# Patient Record
Sex: Female | Born: 1935 | Race: White | Hispanic: No | Smoking: Former smoker
Health system: Southern US, Community
[De-identification: ages and names within clinical notes are randomized; demographics above are authoritative.]

## PROBLEM LIST (undated history)

## (undated) DIAGNOSIS — I10 Essential (primary) hypertension: Secondary | ICD-10-CM

## (undated) DIAGNOSIS — B370 Candidal stomatitis: Secondary | ICD-10-CM

## (undated) DIAGNOSIS — G629 Polyneuropathy, unspecified: Secondary | ICD-10-CM

## (undated) DIAGNOSIS — R0602 Shortness of breath: Secondary | ICD-10-CM

## (undated) DIAGNOSIS — M419 Scoliosis, unspecified: Secondary | ICD-10-CM

## (undated) DIAGNOSIS — G459 Transient cerebral ischemic attack, unspecified: Secondary | ICD-10-CM

## (undated) DIAGNOSIS — C8590 Non-Hodgkin lymphoma, unspecified, unspecified site: Secondary | ICD-10-CM

## (undated) DIAGNOSIS — R51 Headache: Secondary | ICD-10-CM

## (undated) DIAGNOSIS — H269 Unspecified cataract: Secondary | ICD-10-CM

## (undated) DIAGNOSIS — Z8619 Personal history of other infectious and parasitic diseases: Secondary | ICD-10-CM

## (undated) DIAGNOSIS — IMO0001 Reserved for inherently not codable concepts without codable children: Secondary | ICD-10-CM

## (undated) DIAGNOSIS — D649 Anemia, unspecified: Secondary | ICD-10-CM

## (undated) DIAGNOSIS — Z9889 Other specified postprocedural states: Secondary | ICD-10-CM

## (undated) DIAGNOSIS — R112 Nausea with vomiting, unspecified: Secondary | ICD-10-CM

## (undated) DIAGNOSIS — E039 Hypothyroidism, unspecified: Secondary | ICD-10-CM

## (undated) DIAGNOSIS — I639 Cerebral infarction, unspecified: Secondary | ICD-10-CM

## (undated) DIAGNOSIS — C801 Malignant (primary) neoplasm, unspecified: Secondary | ICD-10-CM

## (undated) DIAGNOSIS — Z87440 Personal history of urinary (tract) infections: Secondary | ICD-10-CM

## (undated) DIAGNOSIS — E7800 Pure hypercholesterolemia, unspecified: Secondary | ICD-10-CM

## (undated) DIAGNOSIS — M1990 Unspecified osteoarthritis, unspecified site: Secondary | ICD-10-CM

## (undated) DIAGNOSIS — Z5189 Encounter for other specified aftercare: Secondary | ICD-10-CM

## (undated) DIAGNOSIS — Z9289 Personal history of other medical treatment: Secondary | ICD-10-CM

## (undated) HISTORY — PX: OTHER SURGICAL HISTORY: SHX169

## (undated) HISTORY — PX: MOUTH SURGERY: SHX715

## (undated) HISTORY — PX: PORTACATH PLACEMENT: SHX2246

## (undated) HISTORY — PX: NEPHRECTOMY: SHX65

## (undated) HISTORY — DX: Candidal stomatitis: B37.0

## (undated) HISTORY — PX: TONSILLECTOMY: SUR1361

---

## 1998-02-19 ENCOUNTER — Other Ambulatory Visit: Admission: RE | Admit: 1998-02-19 | Discharge: 1998-02-19 | Payer: Self-pay | Admitting: Family Medicine

## 2010-03-29 ENCOUNTER — Encounter: Payer: Self-pay | Admitting: Internal Medicine

## 2010-04-01 ENCOUNTER — Other Ambulatory Visit: Payer: Self-pay | Admitting: Family Medicine

## 2010-04-01 DIAGNOSIS — R7989 Other specified abnormal findings of blood chemistry: Secondary | ICD-10-CM

## 2010-04-05 ENCOUNTER — Ambulatory Visit
Admission: RE | Admit: 2010-04-05 | Discharge: 2010-04-05 | Disposition: A | Discharge status: Home / Self Care | Payer: Medicare Other | Source: Ambulatory Visit | Attending: Family Medicine | Admitting: Family Medicine

## 2010-04-05 DIAGNOSIS — R7989 Other specified abnormal findings of blood chemistry: Secondary | ICD-10-CM

## 2010-05-06 ENCOUNTER — Other Ambulatory Visit: Payer: Self-pay | Admitting: Internal Medicine

## 2010-05-06 ENCOUNTER — Encounter (HOSPITAL_BASED_OUTPATIENT_CLINIC_OR_DEPARTMENT_OTHER): Payer: Medicare Other | Admitting: Internal Medicine

## 2010-05-06 DIAGNOSIS — D649 Anemia, unspecified: Secondary | ICD-10-CM

## 2010-05-06 LAB — CBC & DIFF AND RETIC
BASO%: 0.4 % (ref 0.0–2.0)
Eosinophils Absolute: 0.1 10*3/uL (ref 0.0–0.5)
Immature Retic Fract: 13.7 % — ABNORMAL HIGH (ref 0.00–10.70)
LYMPH%: 28.5 % (ref 14.0–49.7)
MCHC: 32.3 g/dL (ref 31.5–36.0)
MONO#: 0.1 10*3/uL (ref 0.1–0.9)
NEUT#: 1.9 10*3/uL (ref 1.5–6.5)
Platelets: 257 10*3/uL (ref 145–400)
RBC: 2.2 10*6/uL — ABNORMAL LOW (ref 3.70–5.45)
Retic %: 2.9 % — ABNORMAL HIGH (ref 0.50–1.50)
Retic Ct Abs: 63.8 10*3/uL (ref 18.30–72.70)
WBC: 2.8 10*3/uL — ABNORMAL LOW (ref 3.9–10.3)
lymph#: 0.8 10*3/uL — ABNORMAL LOW (ref 0.9–3.3)

## 2010-05-08 LAB — SPEP & IFE WITH QIG
Albumin ELP: 56.6 % (ref 55.8–66.1)
Alpha-1-Globulin: 6.8 % — ABNORMAL HIGH (ref 2.9–4.9)
Alpha-2-Globulin: 7.8 % (ref 7.1–11.8)
Beta 2: 12.6 % — ABNORMAL HIGH (ref 3.2–6.5)
Beta Globulin: 7.2 % (ref 4.7–7.2)
IgA: 31 mg/dL — ABNORMAL LOW (ref 68–378)

## 2010-05-08 LAB — IRON AND TIBC: TIBC: 276 ug/dL (ref 250–470)

## 2010-05-08 LAB — COMPREHENSIVE METABOLIC PANEL
ALT: 8 U/L (ref 0–35)
BUN: 17 mg/dL (ref 6–23)
CO2: 24 mEq/L (ref 19–32)
Calcium: 9.7 mg/dL (ref 8.4–10.5)
Chloride: 105 mEq/L (ref 96–112)
Creatinine, Ser: 0.99 mg/dL (ref 0.40–1.20)

## 2010-05-08 LAB — KAPPA/LAMBDA LIGHT CHAINS
Kappa free light chain: 2.41 mg/dL — ABNORMAL HIGH (ref 0.33–1.94)
Lambda Free Lght Chn: 1.71 mg/dL (ref 0.57–2.63)

## 2010-05-08 LAB — LACTATE DEHYDROGENASE: LDH: 209 U/L (ref 94–250)

## 2010-05-08 LAB — VITAMIN B12: Vitamin B-12: 441 pg/mL (ref 211–911)

## 2010-05-20 ENCOUNTER — Encounter (HOSPITAL_BASED_OUTPATIENT_CLINIC_OR_DEPARTMENT_OTHER): Payer: Medicare Other | Admitting: Internal Medicine

## 2010-05-20 ENCOUNTER — Other Ambulatory Visit: Payer: Self-pay | Admitting: Internal Medicine

## 2010-05-20 DIAGNOSIS — D649 Anemia, unspecified: Secondary | ICD-10-CM

## 2010-05-20 DIAGNOSIS — D539 Nutritional anemia, unspecified: Secondary | ICD-10-CM

## 2010-05-20 DIAGNOSIS — D472 Monoclonal gammopathy: Secondary | ICD-10-CM

## 2010-05-20 LAB — CBC WITH DIFFERENTIAL/PLATELET
Basophils Absolute: 0 10*3/uL (ref 0.0–0.1)
EOS%: 2.3 % (ref 0.0–7.0)
Eosinophils Absolute: 0.1 10*3/uL (ref 0.0–0.5)
HCT: 21.2 % — ABNORMAL LOW (ref 34.8–46.6)
HGB: 7.1 g/dL — ABNORMAL LOW (ref 11.6–15.9)
MONO#: 0.1 10*3/uL (ref 0.1–0.9)
NEUT#: 1.6 10*3/uL (ref 1.5–6.5)
RDW: 17.8 % — ABNORMAL HIGH (ref 11.2–14.5)
WBC: 2.7 10*3/uL — ABNORMAL LOW (ref 3.9–10.3)
lymph#: 1 10*3/uL (ref 0.9–3.3)

## 2010-05-20 LAB — FECAL OCCULT BLOOD, GUAIAC: Occult Blood: NEGATIVE

## 2010-05-21 ENCOUNTER — Encounter (HOSPITAL_COMMUNITY)
Admission: RE | Admit: 2010-05-21 | Discharge: 2010-05-21 | Disposition: A | Discharge status: Home / Self Care | Payer: Medicare Other | Source: Ambulatory Visit | Attending: Internal Medicine | Admitting: Internal Medicine

## 2010-05-21 DIAGNOSIS — D649 Anemia, unspecified: Secondary | ICD-10-CM | POA: Insufficient documentation

## 2010-05-22 ENCOUNTER — Encounter (HOSPITAL_COMMUNITY): Payer: Medicare Other

## 2010-05-23 LAB — CROSSMATCH
Antibody Screen: NEGATIVE
Unit division: 0

## 2010-05-27 ENCOUNTER — Encounter (HOSPITAL_COMMUNITY): Payer: Medicare Other

## 2010-05-31 ENCOUNTER — Encounter (HOSPITAL_COMMUNITY): Payer: Medicare Other

## 2010-06-04 ENCOUNTER — Encounter (HOSPITAL_COMMUNITY): Payer: Medicare Other

## 2010-06-10 ENCOUNTER — Encounter (HOSPITAL_COMMUNITY): Payer: Medicare Other | Attending: Internal Medicine

## 2010-06-10 DIAGNOSIS — D649 Anemia, unspecified: Secondary | ICD-10-CM | POA: Insufficient documentation

## 2010-06-14 ENCOUNTER — Other Ambulatory Visit: Payer: Self-pay | Admitting: Internal Medicine

## 2010-06-14 ENCOUNTER — Encounter (HOSPITAL_COMMUNITY): Payer: Medicare Other

## 2010-06-14 DIAGNOSIS — D649 Anemia, unspecified: Secondary | ICD-10-CM

## 2010-06-14 LAB — DIFFERENTIAL
Basophils Absolute: 0 10*3/uL (ref 0.0–0.1)
Basophils Relative: 0 % (ref 0–1)
Lymphocytes Relative: 30 % (ref 12–46)
Monocytes Relative: 4 % (ref 3–12)
Neutro Abs: 1.9 10*3/uL (ref 1.7–7.7)
Neutrophils Relative %: 63 % (ref 43–77)

## 2010-06-14 LAB — CBC
HCT: 26.2 % — ABNORMAL LOW (ref 36.0–46.0)
Hemoglobin: 8.3 g/dL — ABNORMAL LOW (ref 12.0–15.0)
RBC: 2.53 MIL/uL — ABNORMAL LOW (ref 3.87–5.11)
WBC: 2.9 10*3/uL — ABNORMAL LOW (ref 4.0–10.5)

## 2010-06-19 ENCOUNTER — Other Ambulatory Visit: Payer: Self-pay | Admitting: Internal Medicine

## 2010-06-19 ENCOUNTER — Encounter (HOSPITAL_BASED_OUTPATIENT_CLINIC_OR_DEPARTMENT_OTHER): Payer: Medicare Other | Admitting: Internal Medicine

## 2010-06-19 DIAGNOSIS — D539 Nutritional anemia, unspecified: Secondary | ICD-10-CM

## 2010-06-19 DIAGNOSIS — C8589 Other specified types of non-Hodgkin lymphoma, extranodal and solid organ sites: Secondary | ICD-10-CM

## 2010-06-19 DIAGNOSIS — C8590 Non-Hodgkin lymphoma, unspecified, unspecified site: Secondary | ICD-10-CM

## 2010-06-19 DIAGNOSIS — D649 Anemia, unspecified: Secondary | ICD-10-CM

## 2010-06-19 DIAGNOSIS — D472 Monoclonal gammopathy: Secondary | ICD-10-CM

## 2010-06-19 LAB — CBC WITH DIFFERENTIAL/PLATELET
Basophils Absolute: 0 10*3/uL (ref 0.0–0.1)
Eosinophils Absolute: 0.1 10*3/uL (ref 0.0–0.5)
HCT: 23.7 % — ABNORMAL LOW (ref 34.8–46.6)
HGB: 7.9 g/dL — ABNORMAL LOW (ref 11.6–15.9)
LYMPH%: 26.2 % (ref 14.0–49.7)
MCV: 102.8 fL — ABNORMAL HIGH (ref 79.5–101.0)
MONO#: 0.1 10*3/uL (ref 0.1–0.9)
MONO%: 3.6 % (ref 0.0–14.0)
NEUT#: 1.9 10*3/uL (ref 1.5–6.5)
Platelets: 227 10*3/uL (ref 145–400)
RBC: 2.3 10*6/uL — ABNORMAL LOW (ref 3.70–5.45)
WBC: 2.8 10*3/uL — ABNORMAL LOW (ref 3.9–10.3)

## 2010-06-21 ENCOUNTER — Encounter (HOSPITAL_BASED_OUTPATIENT_CLINIC_OR_DEPARTMENT_OTHER): Payer: Medicare Other | Admitting: Internal Medicine

## 2010-06-21 DIAGNOSIS — D472 Monoclonal gammopathy: Secondary | ICD-10-CM

## 2010-06-21 DIAGNOSIS — D539 Nutritional anemia, unspecified: Secondary | ICD-10-CM

## 2010-06-21 DIAGNOSIS — D649 Anemia, unspecified: Secondary | ICD-10-CM

## 2010-06-22 LAB — CROSSMATCH
ABO/RH(D): O POS
Antibody Screen: NEGATIVE
Unit division: 0

## 2010-06-24 ENCOUNTER — Encounter (HOSPITAL_COMMUNITY)
Admission: RE | Admit: 2010-06-24 | Discharge: 2010-06-24 | Disposition: A | Discharge status: Home / Self Care | Payer: Medicare Other | Source: Ambulatory Visit | Attending: Internal Medicine | Admitting: Internal Medicine

## 2010-06-24 DIAGNOSIS — C8589 Other specified types of non-Hodgkin lymphoma, extranodal and solid organ sites: Secondary | ICD-10-CM | POA: Insufficient documentation

## 2010-06-24 DIAGNOSIS — C8590 Non-Hodgkin lymphoma, unspecified, unspecified site: Secondary | ICD-10-CM

## 2010-06-24 DIAGNOSIS — N289 Disorder of kidney and ureter, unspecified: Secondary | ICD-10-CM | POA: Insufficient documentation

## 2010-06-24 MED ORDER — FLUDEOXYGLUCOSE F - 18 (FDG) INJECTION
16.8000 | Freq: Once | INTRAVENOUS | Status: AC | PRN
  Administered 2010-06-24: 16.8 via INTRAVENOUS

## 2010-06-25 ENCOUNTER — Encounter (HOSPITAL_BASED_OUTPATIENT_CLINIC_OR_DEPARTMENT_OTHER): Payer: Medicare Other | Admitting: Internal Medicine

## 2010-06-25 ENCOUNTER — Other Ambulatory Visit: Payer: Self-pay | Admitting: Internal Medicine

## 2010-06-25 DIAGNOSIS — D539 Nutritional anemia, unspecified: Secondary | ICD-10-CM

## 2010-06-25 DIAGNOSIS — D649 Anemia, unspecified: Secondary | ICD-10-CM

## 2010-06-25 DIAGNOSIS — D472 Monoclonal gammopathy: Secondary | ICD-10-CM

## 2010-06-25 LAB — COMPREHENSIVE METABOLIC PANEL
Albumin: 4.1 g/dL (ref 3.5–5.2)
Alkaline Phosphatase: 118 U/L — ABNORMAL HIGH (ref 39–117)
BUN: 15 mg/dL (ref 6–23)
CO2: 25 mEq/L (ref 19–32)
Glucose, Bld: 119 mg/dL — ABNORMAL HIGH (ref 70–99)
Potassium: 4 mEq/L (ref 3.5–5.3)
Total Protein: 6.8 g/dL (ref 6.0–8.3)

## 2010-06-25 LAB — CBC WITH DIFFERENTIAL/PLATELET
Basophils Absolute: 0 10*3/uL (ref 0.0–0.1)
Eosinophils Absolute: 0 10*3/uL (ref 0.0–0.5)
HGB: 10.6 g/dL — ABNORMAL LOW (ref 11.6–15.9)
LYMPH%: 28.5 % (ref 14.0–49.7)
MCV: 100.3 fL (ref 79.5–101.0)
MONO#: 0.1 10*3/uL (ref 0.1–0.9)
MONO%: 4.7 % (ref 0.0–14.0)
NEUT#: 1.6 10*3/uL (ref 1.5–6.5)
Platelets: 221 10*3/uL (ref 145–400)
RDW: 18.3 % — ABNORMAL HIGH (ref 11.2–14.5)
WBC: 2.5 10*3/uL — ABNORMAL LOW (ref 3.9–10.3)

## 2010-06-25 LAB — LACTATE DEHYDROGENASE: LDH: 290 U/L — ABNORMAL HIGH (ref 94–250)

## 2010-06-25 LAB — URIC ACID: Uric Acid, Serum: 4.7 mg/dL (ref 2.4–7.0)

## 2010-06-26 ENCOUNTER — Other Ambulatory Visit: Payer: Self-pay | Admitting: Internal Medicine

## 2010-06-26 DIAGNOSIS — C8590 Non-Hodgkin lymphoma, unspecified, unspecified site: Secondary | ICD-10-CM

## 2010-07-05 ENCOUNTER — Other Ambulatory Visit: Payer: Self-pay | Admitting: Internal Medicine

## 2010-07-05 ENCOUNTER — Ambulatory Visit (HOSPITAL_COMMUNITY)
Admission: RE | Admit: 2010-07-05 | Discharge: 2010-07-05 | Disposition: A | Discharge status: Home / Self Care | Payer: Medicare Other | Source: Ambulatory Visit | Attending: Internal Medicine | Admitting: Internal Medicine

## 2010-07-05 ENCOUNTER — Ambulatory Visit (HOSPITAL_COMMUNITY): Payer: Medicare Other

## 2010-07-05 DIAGNOSIS — N289 Disorder of kidney and ureter, unspecified: Secondary | ICD-10-CM | POA: Insufficient documentation

## 2010-07-05 DIAGNOSIS — N2889 Other specified disorders of kidney and ureter: Secondary | ICD-10-CM

## 2010-07-05 DIAGNOSIS — Z8673 Personal history of transient ischemic attack (TIA), and cerebral infarction without residual deficits: Secondary | ICD-10-CM | POA: Insufficient documentation

## 2010-07-05 DIAGNOSIS — E039 Hypothyroidism, unspecified: Secondary | ICD-10-CM | POA: Insufficient documentation

## 2010-07-05 DIAGNOSIS — Z87898 Personal history of other specified conditions: Secondary | ICD-10-CM | POA: Insufficient documentation

## 2010-07-05 DIAGNOSIS — I10 Essential (primary) hypertension: Secondary | ICD-10-CM | POA: Insufficient documentation

## 2010-07-05 DIAGNOSIS — E7800 Pure hypercholesterolemia, unspecified: Secondary | ICD-10-CM | POA: Insufficient documentation

## 2010-07-05 DIAGNOSIS — C8590 Non-Hodgkin lymphoma, unspecified, unspecified site: Secondary | ICD-10-CM

## 2010-07-05 DIAGNOSIS — D649 Anemia, unspecified: Secondary | ICD-10-CM | POA: Insufficient documentation

## 2010-07-05 LAB — PROTIME-INR
INR: 1.15 (ref 0.00–1.49)
Prothrombin Time: 14.9 seconds (ref 11.6–15.2)

## 2010-07-05 LAB — CBC
HCT: 27.1 % — ABNORMAL LOW (ref 36.0–46.0)
MCHC: 32.8 g/dL (ref 30.0–36.0)
Platelets: 227 10*3/uL (ref 150–400)
RDW: 16.3 % — ABNORMAL HIGH (ref 11.5–15.5)
WBC: 3.3 10*3/uL — ABNORMAL LOW (ref 4.0–10.5)

## 2010-07-05 LAB — APTT: aPTT: 32 seconds (ref 24–37)

## 2010-07-09 ENCOUNTER — Other Ambulatory Visit: Payer: Self-pay | Admitting: Internal Medicine

## 2010-07-09 ENCOUNTER — Encounter (HOSPITAL_BASED_OUTPATIENT_CLINIC_OR_DEPARTMENT_OTHER): Payer: Medicare Other | Admitting: Internal Medicine

## 2010-07-09 ENCOUNTER — Encounter (HOSPITAL_COMMUNITY)
Admission: RE | Admit: 2010-07-09 | Discharge: 2010-07-09 | Disposition: A | Discharge status: Home / Self Care | Payer: Medicare Other | Source: Ambulatory Visit | Attending: Internal Medicine | Admitting: Internal Medicine

## 2010-07-09 DIAGNOSIS — C649 Malignant neoplasm of unspecified kidney, except renal pelvis: Secondary | ICD-10-CM

## 2010-07-09 DIAGNOSIS — D649 Anemia, unspecified: Secondary | ICD-10-CM | POA: Insufficient documentation

## 2010-07-09 DIAGNOSIS — D472 Monoclonal gammopathy: Secondary | ICD-10-CM

## 2010-07-09 DIAGNOSIS — D539 Nutritional anemia, unspecified: Secondary | ICD-10-CM

## 2010-07-09 LAB — COMPREHENSIVE METABOLIC PANEL
ALT: 13 U/L (ref 0–35)
AST: 20 U/L (ref 0–37)
CO2: 27 mEq/L (ref 19–32)
Calcium: 9.2 mg/dL (ref 8.4–10.5)
Creatinine, Ser: 0.85 mg/dL (ref 0.50–1.10)
Total Bilirubin: 0.8 mg/dL (ref 0.3–1.2)

## 2010-07-09 LAB — CBC WITH DIFFERENTIAL/PLATELET
BASO%: 0.3 % (ref 0.0–2.0)
Basophils Absolute: 0 10*3/uL (ref 0.0–0.1)
EOS%: 2.3 % (ref 0.0–7.0)
HGB: 8 g/dL — ABNORMAL LOW (ref 11.6–15.9)
MCH: 35 pg — ABNORMAL HIGH (ref 25.1–34.0)
MCHC: 34.3 g/dL (ref 31.5–36.0)
MCV: 102.1 fL — ABNORMAL HIGH (ref 79.5–101.0)
MONO%: 3.9 % (ref 0.0–14.0)
RBC: 2.29 10*6/uL — ABNORMAL LOW (ref 3.70–5.45)
RDW: 17.9 % — ABNORMAL HIGH (ref 11.2–14.5)
lymph#: 0.7 10*3/uL — ABNORMAL LOW (ref 0.9–3.3)

## 2010-07-11 ENCOUNTER — Encounter (HOSPITAL_COMMUNITY): Payer: Medicare Other

## 2010-07-11 LAB — TYPE & CROSSMATCH - CHCC

## 2010-07-12 LAB — CROSSMATCH
Antibody Screen: POSITIVE
Unit division: 0
Unit division: 0

## 2010-07-15 ENCOUNTER — Encounter (HOSPITAL_COMMUNITY): Payer: Medicare Other

## 2010-07-19 ENCOUNTER — Encounter (HOSPITAL_COMMUNITY): Payer: Medicare Other

## 2010-07-23 ENCOUNTER — Encounter (HOSPITAL_COMMUNITY): Payer: Medicare Other

## 2010-07-26 ENCOUNTER — Other Ambulatory Visit: Payer: Self-pay | Admitting: Urology

## 2010-07-29 ENCOUNTER — Encounter (HOSPITAL_COMMUNITY): Payer: Medicare Other

## 2010-08-02 ENCOUNTER — Ambulatory Visit (HOSPITAL_COMMUNITY)
Admission: RE | Admit: 2010-08-02 | Discharge: 2010-08-02 | Disposition: A | Discharge status: Home / Self Care | Payer: Medicare Other | Source: Ambulatory Visit | Attending: Urology | Admitting: Urology

## 2010-08-02 DIAGNOSIS — M47817 Spondylosis without myelopathy or radiculopathy, lumbosacral region: Secondary | ICD-10-CM | POA: Insufficient documentation

## 2010-08-02 DIAGNOSIS — K7689 Other specified diseases of liver: Secondary | ICD-10-CM | POA: Insufficient documentation

## 2010-08-02 DIAGNOSIS — M4120 Other idiopathic scoliosis, site unspecified: Secondary | ICD-10-CM | POA: Insufficient documentation

## 2010-08-02 DIAGNOSIS — C8589 Other specified types of non-Hodgkin lymphoma, extranodal and solid organ sites: Secondary | ICD-10-CM | POA: Insufficient documentation

## 2010-08-02 DIAGNOSIS — N83209 Unspecified ovarian cyst, unspecified side: Secondary | ICD-10-CM | POA: Insufficient documentation

## 2010-08-02 DIAGNOSIS — R109 Unspecified abdominal pain: Secondary | ICD-10-CM | POA: Insufficient documentation

## 2010-08-02 DIAGNOSIS — N281 Cyst of kidney, acquired: Secondary | ICD-10-CM | POA: Insufficient documentation

## 2010-08-02 DIAGNOSIS — C649 Malignant neoplasm of unspecified kidney, except renal pelvis: Secondary | ICD-10-CM | POA: Insufficient documentation

## 2010-08-02 MED ORDER — GADOBENATE DIMEGLUMINE 529 MG/ML IV SOLN
11.0000 mL | Freq: Once | INTRAVENOUS | Status: AC | PRN
  Administered 2010-08-02: 11 mL via INTRAVENOUS

## 2010-08-27 ENCOUNTER — Other Ambulatory Visit (HOSPITAL_COMMUNITY): Payer: Medicare Other

## 2010-08-27 ENCOUNTER — Encounter (HOSPITAL_COMMUNITY)
Admission: RE | Admit: 2010-08-27 | Discharge: 2010-08-27 | Disposition: A | Discharge status: Home / Self Care | Payer: Medicare Other | Source: Ambulatory Visit | Attending: Urology | Admitting: Urology

## 2010-08-27 ENCOUNTER — Other Ambulatory Visit: Payer: Self-pay | Admitting: Urology

## 2010-08-27 LAB — COMPREHENSIVE METABOLIC PANEL
ALT: 10 U/L (ref 0–35)
Albumin: 3.6 g/dL (ref 3.5–5.2)
Alkaline Phosphatase: 125 U/L — ABNORMAL HIGH (ref 39–117)
Chloride: 105 mEq/L (ref 96–112)
Glucose, Bld: 94 mg/dL (ref 70–99)
Potassium: 4.6 mEq/L (ref 3.5–5.1)
Sodium: 141 mEq/L (ref 135–145)
Total Bilirubin: 0.7 mg/dL (ref 0.3–1.2)
Total Protein: 6.5 g/dL (ref 6.0–8.3)

## 2010-08-27 LAB — CBC
HCT: 29.2 % — ABNORMAL LOW (ref 36.0–46.0)
Hemoglobin: 9.6 g/dL — ABNORMAL LOW (ref 12.0–15.0)
MCH: 34 pg (ref 26.0–34.0)
MCV: 103.5 fL — ABNORMAL HIGH (ref 78.0–100.0)
RBC: 2.82 MIL/uL — ABNORMAL LOW (ref 3.87–5.11)

## 2010-08-30 ENCOUNTER — Encounter (HOSPITAL_COMMUNITY)
Admission: RE | Admit: 2010-08-30 | Discharge: 2010-08-30 | Disposition: A | Discharge status: Home / Self Care | Payer: Medicare Other | Source: Ambulatory Visit | Attending: Urology | Admitting: Urology

## 2010-08-30 ENCOUNTER — Other Ambulatory Visit: Payer: Self-pay | Admitting: Urology

## 2010-09-02 ENCOUNTER — Other Ambulatory Visit: Payer: Self-pay | Admitting: Urology

## 2010-09-02 ENCOUNTER — Inpatient Hospital Stay (HOSPITAL_COMMUNITY): Payer: Medicare Other

## 2010-09-02 ENCOUNTER — Inpatient Hospital Stay (HOSPITAL_COMMUNITY)
Admission: RE | Admit: 2010-09-02 | Discharge: 2010-09-10 | DRG: 657 | Disposition: A | Discharge status: Skilled Nursing Facility | Payer: Medicare Other | Source: Ambulatory Visit | Attending: Urology | Admitting: Urology

## 2010-09-02 DIAGNOSIS — D649 Anemia, unspecified: Secondary | ICD-10-CM | POA: Diagnosis present

## 2010-09-02 DIAGNOSIS — Z87891 Personal history of nicotine dependence: Secondary | ICD-10-CM

## 2010-09-02 DIAGNOSIS — Z8673 Personal history of transient ischemic attack (TIA), and cerebral infarction without residual deficits: Secondary | ICD-10-CM

## 2010-09-02 DIAGNOSIS — I10 Essential (primary) hypertension: Secondary | ICD-10-CM | POA: Diagnosis present

## 2010-09-02 DIAGNOSIS — E785 Hyperlipidemia, unspecified: Secondary | ICD-10-CM | POA: Diagnosis present

## 2010-09-02 DIAGNOSIS — Z79899 Other long term (current) drug therapy: Secondary | ICD-10-CM

## 2010-09-02 DIAGNOSIS — Z85828 Personal history of other malignant neoplasm of skin: Secondary | ICD-10-CM

## 2010-09-02 DIAGNOSIS — T448X5A Adverse effect of centrally-acting and adrenergic-neuron-blocking agents, initial encounter: Secondary | ICD-10-CM | POA: Diagnosis not present

## 2010-09-02 DIAGNOSIS — I823 Embolism and thrombosis of renal vein: Secondary | ICD-10-CM | POA: Diagnosis present

## 2010-09-02 DIAGNOSIS — E039 Hypothyroidism, unspecified: Secondary | ICD-10-CM | POA: Diagnosis present

## 2010-09-02 DIAGNOSIS — I498 Other specified cardiac arrhythmias: Secondary | ICD-10-CM | POA: Diagnosis not present

## 2010-09-02 DIAGNOSIS — C8589 Other specified types of non-Hodgkin lymphoma, extranodal and solid organ sites: Secondary | ICD-10-CM | POA: Diagnosis present

## 2010-09-02 DIAGNOSIS — Z7982 Long term (current) use of aspirin: Secondary | ICD-10-CM

## 2010-09-02 DIAGNOSIS — C649 Malignant neoplasm of unspecified kidney, except renal pelvis: Secondary | ICD-10-CM

## 2010-09-02 LAB — CBC
Hemoglobin: 10.7 g/dL — ABNORMAL LOW (ref 12.0–15.0)
MCH: 33.5 pg (ref 26.0–34.0)
MCHC: 34 g/dL (ref 30.0–36.0)
MCV: 98.7 fL (ref 78.0–100.0)
RBC: 3.19 MIL/uL — ABNORMAL LOW (ref 3.87–5.11)

## 2010-09-02 LAB — BASIC METABOLIC PANEL
BUN: 10 mg/dL (ref 6–23)
CO2: 29 mEq/L (ref 19–32)
Calcium: 9.2 mg/dL (ref 8.4–10.5)
Creatinine, Ser: 0.63 mg/dL (ref 0.50–1.10)
Glucose, Bld: 193 mg/dL — ABNORMAL HIGH (ref 70–99)

## 2010-09-02 NOTE — Op Note (Signed)
NAMEKESS, MCILWAIN NO.:  [id number]  MEDICAL RECORD NO.:  [id number]  LOCATION:  2550                         FACILITY:  [facility]  PHYSICIAN:  Bertram Millard. Barrie Wale, M.D.DATE OF BIRTH:  1925/05/24  DATE OF PROCEDURE:  09/02/2010 DATE OF DISCHARGE:                              OPERATIVE REPORT   PREOPERATIVE DIAGNOSIS:  An 8-cm lower pole right renal mass with tumor thrombus to inferior vena cava.  POSTOPERATIVE DIAGNOSIS:  An 8-cm lower pole right renal mass with tumor thrombus to inferior vena cava.  PRINCIPAL PROCEDURE:  Radical nephrectomy, tumor thrombectomy.  SURGEON:  Bertram Millard. Retta Diones, MD and Larina Earthly, MD.  ANESTHESIA:  General endotracheal.  COMPLICATIONS:  None.  ESTIMATED BLOOD LOSS:  300 mL.  FLUID REPLACEMENT:  1 liter crystalloid, 1 unit packed red blood cells.  SPECIMEN:  Right kidney to pathology.  COMPLICATIONS:  None.  BRIEF HISTORY:  This is an 77 year old female, who presents at this time for radical nephrectomy.  She has a history of lymphoma.  In her follow- up, she recently underwent a CT scan that revealed an 8-cm lower pole renal mass.  There was some question of thrombus in the renal vein, this was verified on MRI scan.  The thrombus went up to but not necessarily into the vena cava.  Metastatic evaluation was negative.  I have counseled the patient on two occasions in the office about surgery, risks and complications.  She understands these, and desires to proceed.  DESCRIPTION OF PROCEDURE:  The patient was identified in the holding area.  She received preoperative pulmonary artery catheter placement and arterial line placement by anesthesia.  She received preoperative IV antibiotics.  After the surgical side was marked, she was taken to the operating room where general anesthetic was administered.  She was placed in the recumbent position with a bump in the right flank area. Her abdomen was prepped and draped.   Time-out was then performed.  The procedure was then commenced.  A generous right subcostal incision was made anteriorly and carried down through the muscular and fascial layers with electrocautery.  Her fascia was somewhat thinned due to the patient's age.  The abdomen was entered.  The retractor was placed. Adhesions between the colon and the gallbladder were taken down with electrocautery.  The tumor was quite mobile and it was in the lower pole of the kidney.  After the retractor was placed, the anterior surface of Gerota's fascia was carefully cleaned with blunt and electrocautery dissection.  The duodenum was mobilized medially with dissection.  The inferior vena cava was then identified.  The ureter was quite anterior and was identified, clipped, and divided.  The gonadal vein was also clipped and divided.  This dissection was then carried superiorly and medially along the cava.  The posterior renal artery was seen.  This was doubly clipped medially with Hemlocks, and singly clipped laterally.  It was then divided.  Dissection was carried out circumferentially around the renal vein.  The tumor thrombus was seen through the wall of the renal vein, protruding up into the vena cava with respirations.  The tumor thrombus was then milked back into the  renal vein, and once it was generously inside the renal vein, Dr. Arbie Cookey clipped or clamped the renal vein just lateral to the vena cava.  Another Debakey clamp was placed about 2 cm more lateral to this, with the tumor thrombus well lateral to that second clamp.  The renal vein was then divided.  Dr. Arbie Cookey will dictate this part of the case separately, as well as closure of the renal vein.  Following division and ligation of the renal vein, the remaining attachments of the kidney were then taken care of with electrocautery.  The kidney was then passed from the patient.  Within the area of the adrenal bed, (the adrenal could not be seen at  this point), small blood vessels were electrocoagulated.  Surgicel was placed in this area.  At this point, the surgical site/renal bed was hemostatic.  We then mixed 20 mL of Exparel and 100 mL of saline and injected this in the patient's subcutaneous and muscular layers. Following this, two-layer closure was then performed in an anatomic fashion using #1 PDS placed in simple running fashion.  Staples were then placed to reapproximate the skin edges.  Dry sterile dressing was placed.  Estimated blood loss was approximately 300 mL.  The patient received a liter of crystalloid and was started on 1 unit of packed red cells per anesthesia.  She was awakened and taken to PACU in stable condition.     Bertram Millard. Laetitia Schnepf, M.D.     SMD/MEDQ  D:  09/02/2010  T:  09/02/2010  Job:  [id number]  cc:   Larina Earthly, M.D. Lajuana Matte, M.D. [name], MD  Electronically Signed by Marcine Matar M.D. on 09/02/2010 12:16:50 PM

## 2010-09-03 LAB — CBC
MCH: 33 pg (ref 26.0–34.0)
MCHC: 34 g/dL (ref 30.0–36.0)
MCV: 97 fL (ref 78.0–100.0)
Platelets: 165 10*3/uL (ref 150–400)
RBC: 3 MIL/uL — ABNORMAL LOW (ref 3.87–5.11)
RDW: 18.6 % — ABNORMAL HIGH (ref 11.5–15.5)

## 2010-09-03 LAB — TYPE AND SCREEN
ABO/RH(D): O POS
Donor AG Type: NEGATIVE
Donor AG Type: NEGATIVE
Donor AG Type: NEGATIVE
Donor AG Type: NEGATIVE
Unit division: 0
Unit division: 0
Unit division: 0
Unit division: 0

## 2010-09-03 LAB — BASIC METABOLIC PANEL
CO2: 27 mEq/L (ref 19–32)
Calcium: 8.2 mg/dL — ABNORMAL LOW (ref 8.4–10.5)
Creatinine, Ser: 0.88 mg/dL (ref 0.50–1.10)
GFR calc non Af Amer: >60 mL/min (ref >60)

## 2010-09-03 LAB — POCT I-STAT 4, (NA,K, GLUC, HGB,HCT)
Glucose, Bld: 135 mg/dL — ABNORMAL HIGH (ref 70–99)
HCT: 21 % — ABNORMAL LOW (ref 36.0–46.0)
Hemoglobin: 7.1 g/dL — ABNORMAL LOW (ref 12.0–15.0)
Potassium: 3.3 mEq/L — ABNORMAL LOW (ref 3.5–5.1)
Potassium: 3.4 mEq/L — ABNORMAL LOW (ref 3.5–5.1)

## 2010-09-04 LAB — CBC
HCT: 31 % — ABNORMAL LOW (ref 36.0–46.0)
RDW: 18 % — ABNORMAL HIGH (ref 11.5–15.5)
WBC: 4.5 10*3/uL (ref 4.0–10.5)

## 2010-09-05 LAB — CARDIAC PANEL(CRET KIN+CKTOT+MB+TROPI)
CK, MB: 2 ng/mL (ref 0.3–4.0)
Total CK: 43 U/L (ref 7–177)
Total CK: 72 U/L (ref 7–177)
Troponin I: <0.3 ng/mL (ref <0.30)

## 2010-09-06 LAB — BASIC METABOLIC PANEL
CO2: 28 mEq/L (ref 19–32)
Calcium: 8.4 mg/dL (ref 8.4–10.5)
Chloride: 108 mEq/L (ref 96–112)
Glucose, Bld: 88 mg/dL (ref 70–99)
Potassium: 3.1 mEq/L — ABNORMAL LOW (ref 3.5–5.1)
Sodium: 141 mEq/L (ref 135–145)

## 2010-09-06 LAB — T3, FREE: T3, Free: 2.6 pg/mL (ref 2.3–4.2)

## 2010-09-06 LAB — T4, FREE: Free T4: 1.72 ng/dL (ref 0.80–1.80)

## 2010-09-09 NOTE — Discharge Summary (Addendum)
Melissa Cross, Melissa Cross NO.:  [id number]  MEDICAL RECORD NO.:  [id number]  LOCATION:  SDS                          FACILITY:  [facility]  PHYSICIAN:  Bertram Millard. Loudon Krakow, M.D.DATE OF BIRTH:  [birth year]  DATE OF ADMISSION:  09/02/2010 DATE OF DISCHARGE:                              DISCHARGE SUMMARY   PRIMARY DIAGNOSIS:  Renal cell carcinoma, pathologic stage T3a Nx, nuclear grade 2/4, clear cell type.  PRINCIPAL PROCEDURE:  Right radical nephrectomy, tumor thrombectomy from cava on September 02, 2010.  BRIEF HISTORY:  This 77 year old female was admitted on September 02, 2010, for definitive surgical management of an 8-cm right lower-pole renal mass.  This is consistent with the renal cell carcinoma, with the tumor thrombus in her right renal vein and possibly into the vena cava.  The patient has a negative metastatic evaluation.  She does have a history of non-Hodgkin's lymphoma.  She is cared for byDr. Si Gaul.  The patient's primary care provider is Dr. Mila Palmer.  ADMISSION LABORATORIES:  Hemoglobin was 29.2, white count of 43, 000, platelet count 217,000.  Comprehensive metabolic profile was normal except for an alkaline phosphatase, which was slightly elevated at 125. Chest x-ray revealed no active disease.  EKG revealed sinus rhythm with first-degree AV block.  HOSPITAL COURSE:  The patient was admitted to my service, and on the day of admission, underwent right radical nephrectomy, this was an open approach, due to the large size of the patient's renal mass.  She also underwent tumor thrombectomy from her vena cava.  She tolerated the procedure well.  She received 2 units of packed red blood cells for her borderline low hematocrit to start with, plus blood loss. Postoperatively, she did well.  A couple days of postop, she had an episode of bradycardia, down to 148.  Her resting heart rate previously was 60.  She was on beta-blockers.  MI was  ruled out by the hospital service.  The patient's bowel function was somewhat slow to recover, but by the third postoperative day, she was eating a regular diet.  She ambulated.  Her hemoglobin and serum creatinine were both stable.  Due to the patient's age and due to remove postoperative weakness, it was recommended that she wait for nursing home placement.  That is pending at this time.  DISCHARGE MEDICATIONS:  Her medications on discharge will include Percocet 5/325, one p.o. daily or q.4 h. p.r.n., Norvasc 5 mg daily. Her nadolol was stopped.  She will receive multivitamin once a day, simvastatin 20 mg daily, vitamin B12 of 1000 mcg daily, levothyroxine 50 mcg daily, aspirin enteric-coated 81 mg daily, and Citracal Plus D 1 daily.  She can take her supplements of ginger root, garlic extract, and wheat grass.  She can also take Advil 200 mg 2 p.o. q.4 h. p.r.n. pain.  DISCHARGE INSTRUCTIONS:  The patient will follow up in my office for staple removal.  It will be recommended that she increase her activities, get physical therapy if needed, and take a regular diet.     Bertram Millard. Verity Gilcrest, M.D.     SMD/MEDQ  D:  09/08/2010  T:  09/08/2010  Job:  [id number]  cc:   [name], MD Lajuana Matte, M.D.  Electronically Signed by Marcine Matar M.D. on 10/10/2010 05:25:06 AM

## 2010-09-24 NOTE — Op Note (Signed)
  Melissa Cross, Melissa Cross NO.:  [id number]  MEDICAL RECORD NO.:  [id number]  LOCATION:  3302                         FACILITY:  [facility]  PHYSICIAN:  Larina Earthly, M.D.    DATE OF BIRTH:  [birth year]  DATE OF PROCEDURE:  09/02/2010 DATE OF DISCHARGE:                              OPERATIVE REPORT   PREOPERATIVE DIAGNOSIS:  Right renal cell carcinoma.  POSTOPERATIVE DIAGNOSIS:  Right renal cell carcinoma.  PROCEDURE:  Control of right renal vein and assistance with Dr. Marcine Matar, primary surgeon for right renal nephrectomy.  ANESTHESIA:  General endotracheal.  COMPLICATIONS:  None.  DISPOSITION:  To recovery room stable.  PROCEDURE IN DETAIL:  The patient was taken to the operating room and prepped and draped in usual fashion, which will be dictated as a separate note by Dr. Retta Diones, a right subcostal incision was made. The kidney was mobilized fully and was rotated anteriorly.  On mobilization of the vena cava and right renal vein, the tumor was easily seen within the right renal vein and was actually ball valving back-and- forth into the vena cava with respirations on the ventilator.  The tumor was pushed back into the renal vein manually was quite mobile but this was quite easy, there was no trapping of the tumor in the renal vein. The tumor was pushed back into the renal vein and then DeBakey aortic clamps were placed proximal and distal.  A cuff of renal vein was left for closure of the renal vein at the vena cava.  The renal vein was divided and the cuff of renal vein was oversewn with a running 5-0 Prolene suture in two layers.  The aortic DeBakey clamp was removed and one additional suture was required for hemostasis.  The renal artery was divided between Ligaclips.  The remainder of the dictation including closure will be dictated as a separate note by Dr. Marcine Matar.     Larina Earthly, M.D.     TFE/MEDQ  D:  09/03/2010  T:   09/03/2010  Job:  [id number]  Electronically Signed by Benjamim Harnish M.D. on 09/24/2010 01:11:36 PM

## 2010-09-30 ENCOUNTER — Other Ambulatory Visit: Payer: Self-pay | Admitting: Internal Medicine

## 2010-09-30 ENCOUNTER — Encounter (HOSPITAL_BASED_OUTPATIENT_CLINIC_OR_DEPARTMENT_OTHER): Payer: Medicare Other | Admitting: Internal Medicine

## 2010-09-30 DIAGNOSIS — D649 Anemia, unspecified: Secondary | ICD-10-CM

## 2010-09-30 DIAGNOSIS — D472 Monoclonal gammopathy: Secondary | ICD-10-CM

## 2010-09-30 DIAGNOSIS — Z905 Acquired absence of kidney: Secondary | ICD-10-CM

## 2010-09-30 DIAGNOSIS — D539 Nutritional anemia, unspecified: Secondary | ICD-10-CM

## 2010-09-30 DIAGNOSIS — C8589 Other specified types of non-Hodgkin lymphoma, extranodal and solid organ sites: Secondary | ICD-10-CM

## 2010-09-30 DIAGNOSIS — C649 Malignant neoplasm of unspecified kidney, except renal pelvis: Secondary | ICD-10-CM

## 2010-09-30 LAB — CBC WITH DIFFERENTIAL/PLATELET
BASO%: 0.3 % (ref 0.0–2.0)
EOS%: 1.7 % (ref 0.0–7.0)
MCH: 34.4 pg — ABNORMAL HIGH (ref 25.1–34.0)
MCV: 100.2 fL (ref 79.5–101.0)
MONO%: 3.1 % (ref 0.0–14.0)
RBC: 3.23 10*6/uL — ABNORMAL LOW (ref 3.70–5.45)
RDW: 16.5 % — ABNORMAL HIGH (ref 11.2–14.5)
lymph#: 1.2 10*3/uL (ref 0.9–3.3)

## 2010-09-30 LAB — COMPREHENSIVE METABOLIC PANEL
ALT: 12 U/L (ref 0–35)
AST: 22 U/L (ref 0–37)
Albumin: 4 g/dL (ref 3.5–5.2)
Alkaline Phosphatase: 109 U/L (ref 39–117)
Potassium: 4 mEq/L (ref 3.5–5.3)
Sodium: 140 mEq/L (ref 135–145)
Total Protein: 7.1 g/dL (ref 6.0–8.3)

## 2010-10-08 ENCOUNTER — Other Ambulatory Visit: Payer: Self-pay | Admitting: Internal Medicine

## 2010-10-08 DIAGNOSIS — C8190 Hodgkin lymphoma, unspecified, unspecified site: Secondary | ICD-10-CM

## 2010-10-10 ENCOUNTER — Ambulatory Visit (HOSPITAL_COMMUNITY)
Admission: RE | Admit: 2010-10-10 | Discharge: 2010-10-10 | Disposition: A | Discharge status: Home / Self Care | Payer: Medicare Other | Source: Ambulatory Visit | Attending: Internal Medicine | Admitting: Internal Medicine

## 2010-10-10 ENCOUNTER — Other Ambulatory Visit: Payer: Self-pay | Admitting: Internal Medicine

## 2010-10-10 ENCOUNTER — Other Ambulatory Visit (HOSPITAL_COMMUNITY): Payer: Self-pay | Admitting: Internal Medicine

## 2010-10-10 DIAGNOSIS — Z8673 Personal history of transient ischemic attack (TIA), and cerebral infarction without residual deficits: Secondary | ICD-10-CM | POA: Insufficient documentation

## 2010-10-10 DIAGNOSIS — C8190 Hodgkin lymphoma, unspecified, unspecified site: Secondary | ICD-10-CM

## 2010-10-10 DIAGNOSIS — Z87891 Personal history of nicotine dependence: Secondary | ICD-10-CM | POA: Insufficient documentation

## 2010-10-10 DIAGNOSIS — I10 Essential (primary) hypertension: Secondary | ICD-10-CM | POA: Insufficient documentation

## 2010-10-10 DIAGNOSIS — C50919 Malignant neoplasm of unspecified site of unspecified female breast: Secondary | ICD-10-CM

## 2010-10-10 DIAGNOSIS — E039 Hypothyroidism, unspecified: Secondary | ICD-10-CM | POA: Insufficient documentation

## 2010-10-10 DIAGNOSIS — C8589 Other specified types of non-Hodgkin lymphoma, extranodal and solid organ sites: Secondary | ICD-10-CM | POA: Insufficient documentation

## 2010-10-10 NOTE — Consult Note (Signed)
Melissa Cross, KEN NO.:  [id number]  MEDICAL RECORD NO.:  [id number]  LOCATION:  6708                         FACILITY:  [facility]  PHYSICIAN:  Richarda Overlie, MD       DATE OF BIRTH:  [birth year]  DATE OF CONSULTATION: DATE OF DISCHARGE:                                CONSULTATION   REQUESTING PHYSICIAN:  Bertram Millard. Dahlstedt, MD  CHIEF COMPLAINT:  Bradycardia.  SUBJECTIVE:  This is an 77 year old female with a history of 8-cm lower right renal mass status post radical nephrectomy who is being evaluated for bradycardia.  The patient was recently evaluated for anemia.  Bone marrow biopsy revealed lymphoma.  She had a PET CT scan performed that showed an 8-cm right renal mass consistent with renal cell carcinoma, clear cell type.  The patient has had 20-25 pound weight loss within the last 4 months.  She does not have any gross hematuria or flank pain. She has had decreased oral intake since the surgery but denies any nausea, vomiting, abdominal pain at this time.  She has been afebrile.  PAST MEDICAL HISTORY: 1. Dyslipidemia. 2. Hypertension. 3. Hypothyroidism. 4. Malignant lymphoma. 5. Non-Hodgkin lymphoma. 6. Previous heart murmur. 7. History of skin cancer. 8. Transient ischemic attack.  SURGICAL HISTORY:  Tonsillectomy, right nephrectomy.  HOME MEDICATIONS:  Aspirin, Centrum Silver, garlic, ginger, levothyroxine, atoll, simvastatin, vitamin B12, vitamin B3.  ALLERGIES:  No known drug allergies.  FAMILY HISTORY:  Paternal history of colon cancer.  Family history of death in the family.  Father passed away at the age of 73 from colon cancer, liver metastasis and mother passed away from stroke at the age of 40.  She has 2 sons and 2 daughters.  SOCIAL HISTORY:  She drinks 2 glasses of coffee per day.  She smoked however, a pack a day for 30 years, quit in 1995.  She is divorced.  She is retired and drinks alcohol occasionally.  REVIEW OF SYSTEMS:   Complete review of systems was done is documented in the HPI.  PHYSICAL EXAMINATION:  VITAL SIGNS:  Blood pressure 128/66, respirations 18, pulse 49, temperature 98.4, 96% on room air. GENERAL:  Comfortable currently in no acute cardiopulmonary distress. HEENT:  Pupils equal and reactive.  Extraocular movements intact. NECK:  Supple.  No JVD. LUNGS:  Clear to auscultation. CARDIOVASCULAR:  Regular rate and rhythm.  No murmurs, rubs or gallops. ABDOMEN:  The patient refused examination. NEUROLOGIC:  The patient refused examination.  IMAGING:  EKG pending at this time.  LABORATORY DATA:  CBC, WBC 4.5, hemoglobin 10.0, hematocrit 31.0, platelet count of 167.  ASSESSMENT AND PLAN:  Bradycardia, likely secondary to nadolol could be secondary to narcotics since the patient has received earlier during the course of her treatment here.  However, she is currently on Toradol for pain..  The patient denies any cardiopulmonary symptoms.  She does have a history of hypothyroidism, therefore, we will check a TSH and a free T4.  We will check an EKG on the telemetry.  She has first degree AV block.  We will rule out an acute coronary syndrome.  Postop we will cycle cardiac enzymes q. 8  x3, obtain a 2-D echo to rule out wall motion abnormalities.  Kindly note that the patient deferred a full examination during this interview and was extremely unhappy that she was disturbed at around 11:30 at night without prior notification.  We thank Dr. Patsi Sears for this consult.  She is a full code.     Richarda Overlie, MD     NA/MEDQ  D:  09/04/2010  T:  09/05/2010  Job:  [id number]  Electronically Signed by Richarda Overlie MD on 10/10/2010 01:29:41 PM

## 2010-10-11 ENCOUNTER — Ambulatory Visit (HOSPITAL_COMMUNITY): Payer: Medicare Other | Attending: Internal Medicine | Admitting: Radiology

## 2010-10-11 DIAGNOSIS — C50919 Malignant neoplasm of unspecified site of unspecified female breast: Secondary | ICD-10-CM

## 2010-10-11 DIAGNOSIS — I428 Other cardiomyopathies: Secondary | ICD-10-CM | POA: Insufficient documentation

## 2010-10-14 ENCOUNTER — Encounter (HOSPITAL_COMMUNITY): Payer: Self-pay | Admitting: Internal Medicine

## 2010-10-14 ENCOUNTER — Other Ambulatory Visit: Payer: Self-pay | Admitting: Internal Medicine

## 2010-10-14 ENCOUNTER — Encounter (HOSPITAL_BASED_OUTPATIENT_CLINIC_OR_DEPARTMENT_OTHER): Payer: Medicare Other | Admitting: Internal Medicine

## 2010-10-14 DIAGNOSIS — C649 Malignant neoplasm of unspecified kidney, except renal pelvis: Secondary | ICD-10-CM

## 2010-10-14 DIAGNOSIS — D539 Nutritional anemia, unspecified: Secondary | ICD-10-CM

## 2010-10-14 DIAGNOSIS — D649 Anemia, unspecified: Secondary | ICD-10-CM

## 2010-10-14 DIAGNOSIS — D472 Monoclonal gammopathy: Secondary | ICD-10-CM

## 2010-10-14 DIAGNOSIS — Z5111 Encounter for antineoplastic chemotherapy: Secondary | ICD-10-CM

## 2010-10-14 DIAGNOSIS — C8589 Other specified types of non-Hodgkin lymphoma, extranodal and solid organ sites: Secondary | ICD-10-CM

## 2010-10-14 LAB — COMPREHENSIVE METABOLIC PANEL
ALT: 9 U/L (ref 0–35)
AST: 16 U/L (ref 0–37)
Albumin: 3.7 g/dL (ref 3.5–5.2)
Alkaline Phosphatase: 96 U/L (ref 39–117)
BUN: 19 mg/dL (ref 6–23)
CO2: 24 mEq/L (ref 19–32)
Calcium: 8.9 mg/dL (ref 8.4–10.5)
Chloride: 104 mEq/L (ref 96–112)
Creatinine, Ser: 1.01 mg/dL (ref 0.50–1.10)
Glucose, Bld: 108 mg/dL — ABNORMAL HIGH (ref 70–99)
Potassium: 4.2 mEq/L (ref 3.5–5.3)
Sodium: 140 mEq/L (ref 135–145)
Total Bilirubin: 0.4 mg/dL (ref 0.3–1.2)
Total Protein: 6.3 g/dL (ref 6.0–8.3)

## 2010-10-14 LAB — CBC WITH DIFFERENTIAL/PLATELET
Basophils Absolute: 0 10*3/uL (ref 0.0–0.1)
EOS%: 2.6 % (ref 0.0–7.0)
HGB: 11.4 g/dL — ABNORMAL LOW (ref 11.6–15.9)
MCH: 33.3 pg (ref 25.1–34.0)
MCHC: 32.9 g/dL (ref 31.5–36.0)
MCV: 101.2 fL — ABNORMAL HIGH (ref 79.5–101.0)
MONO%: 4.2 % (ref 0.0–14.0)
RDW: 15.3 % — ABNORMAL HIGH (ref 11.2–14.5)

## 2010-10-14 LAB — URIC ACID: Uric Acid, Serum: 5.7 mg/dL (ref 2.4–7.0)

## 2010-10-14 LAB — LACTATE DEHYDROGENASE: LDH: 217 U/L (ref 94–250)

## 2010-10-15 ENCOUNTER — Encounter (HOSPITAL_BASED_OUTPATIENT_CLINIC_OR_DEPARTMENT_OTHER): Payer: Medicare Other | Admitting: Internal Medicine

## 2010-10-15 DIAGNOSIS — C649 Malignant neoplasm of unspecified kidney, except renal pelvis: Secondary | ICD-10-CM

## 2010-10-15 DIAGNOSIS — C8589 Other specified types of non-Hodgkin lymphoma, extranodal and solid organ sites: Secondary | ICD-10-CM

## 2010-10-15 DIAGNOSIS — Z5189 Encounter for other specified aftercare: Secondary | ICD-10-CM

## 2010-10-21 ENCOUNTER — Encounter (HOSPITAL_BASED_OUTPATIENT_CLINIC_OR_DEPARTMENT_OTHER): Payer: Medicare Other | Admitting: Internal Medicine

## 2010-10-21 ENCOUNTER — Other Ambulatory Visit: Payer: Self-pay | Admitting: Internal Medicine

## 2010-10-21 DIAGNOSIS — D649 Anemia, unspecified: Secondary | ICD-10-CM

## 2010-10-21 DIAGNOSIS — D539 Nutritional anemia, unspecified: Secondary | ICD-10-CM

## 2010-10-21 DIAGNOSIS — C8589 Other specified types of non-Hodgkin lymphoma, extranodal and solid organ sites: Secondary | ICD-10-CM

## 2010-10-21 DIAGNOSIS — D472 Monoclonal gammopathy: Secondary | ICD-10-CM

## 2010-10-21 DIAGNOSIS — C649 Malignant neoplasm of unspecified kidney, except renal pelvis: Secondary | ICD-10-CM

## 2010-10-21 DIAGNOSIS — Z79899 Other long term (current) drug therapy: Secondary | ICD-10-CM

## 2010-10-21 DIAGNOSIS — D709 Neutropenia, unspecified: Secondary | ICD-10-CM

## 2010-10-21 LAB — CBC WITH DIFFERENTIAL/PLATELET
Basophils Absolute: 0 10*3/uL (ref 0.0–0.1)
Eosinophils Absolute: 0.1 10*3/uL (ref 0.0–0.5)
HCT: 29.2 % — ABNORMAL LOW (ref 34.8–46.6)
HGB: 9.9 g/dL — ABNORMAL LOW (ref 11.6–15.9)
MONO#: 0 10*3/uL — ABNORMAL LOW (ref 0.1–0.9)
NEUT%: 0 % — ABNORMAL LOW (ref 38.4–76.8)
WBC: 0.7 10*3/uL — CL (ref 3.9–10.3)
lymph#: 0.6 10*3/uL — ABNORMAL LOW (ref 0.9–3.3)

## 2010-10-21 LAB — COMPREHENSIVE METABOLIC PANEL
BUN: 26 mg/dL — ABNORMAL HIGH (ref 6–23)
CO2: 29 mEq/L (ref 19–32)
Calcium: 9.1 mg/dL (ref 8.4–10.5)
Chloride: 99 mEq/L (ref 96–112)
Creatinine, Ser: 0.98 mg/dL (ref 0.50–1.10)

## 2010-10-21 LAB — LACTATE DEHYDROGENASE: LDH: 104 U/L (ref 94–250)

## 2010-10-28 ENCOUNTER — Encounter (HOSPITAL_BASED_OUTPATIENT_CLINIC_OR_DEPARTMENT_OTHER): Payer: Medicare Other | Admitting: Internal Medicine

## 2010-10-28 ENCOUNTER — Other Ambulatory Visit: Payer: Self-pay | Admitting: Internal Medicine

## 2010-10-28 DIAGNOSIS — D472 Monoclonal gammopathy: Secondary | ICD-10-CM

## 2010-10-28 DIAGNOSIS — C8589 Other specified types of non-Hodgkin lymphoma, extranodal and solid organ sites: Secondary | ICD-10-CM

## 2010-10-28 DIAGNOSIS — D649 Anemia, unspecified: Secondary | ICD-10-CM

## 2010-10-28 DIAGNOSIS — D539 Nutritional anemia, unspecified: Secondary | ICD-10-CM

## 2010-10-28 LAB — CBC WITH DIFFERENTIAL/PLATELET
Basophils Absolute: 0 10*3/uL (ref 0.0–0.1)
EOS%: 0.4 % (ref 0.0–7.0)
Eosinophils Absolute: 0 10*3/uL (ref 0.0–0.5)
HGB: 10.5 g/dL — ABNORMAL LOW (ref 11.6–15.9)
MCH: 34.4 pg — ABNORMAL HIGH (ref 25.1–34.0)
NEUT#: 4.4 10*3/uL (ref 1.5–6.5)
RBC: 3.05 10*6/uL — ABNORMAL LOW (ref 3.70–5.45)
RDW: 16 % — ABNORMAL HIGH (ref 11.2–14.5)
lymph#: 1 10*3/uL (ref 0.9–3.3)

## 2010-10-28 LAB — COMPREHENSIVE METABOLIC PANEL
ALT: 9 U/L (ref 0–35)
AST: 17 U/L (ref 0–37)
Albumin: 3.8 g/dL (ref 3.5–5.2)
BUN: 18 mg/dL (ref 6–23)
Calcium: 9.1 mg/dL (ref 8.4–10.5)
Chloride: 102 mEq/L (ref 96–112)
Potassium: 4.4 mEq/L (ref 3.5–5.3)
Sodium: 137 mEq/L (ref 135–145)
Total Protein: 6.5 g/dL (ref 6.0–8.3)

## 2010-11-01 ENCOUNTER — Other Ambulatory Visit: Payer: Self-pay | Admitting: Internal Medicine

## 2010-11-01 ENCOUNTER — Encounter (HOSPITAL_BASED_OUTPATIENT_CLINIC_OR_DEPARTMENT_OTHER): Payer: Medicare Other | Admitting: Internal Medicine

## 2010-11-01 DIAGNOSIS — D539 Nutritional anemia, unspecified: Secondary | ICD-10-CM

## 2010-11-01 DIAGNOSIS — C8589 Other specified types of non-Hodgkin lymphoma, extranodal and solid organ sites: Secondary | ICD-10-CM

## 2010-11-01 DIAGNOSIS — R300 Dysuria: Secondary | ICD-10-CM

## 2010-11-01 DIAGNOSIS — D649 Anemia, unspecified: Secondary | ICD-10-CM

## 2010-11-01 DIAGNOSIS — D472 Monoclonal gammopathy: Secondary | ICD-10-CM

## 2010-11-01 LAB — URINALYSIS, MICROSCOPIC - CHCC
Bilirubin (Urine): NEGATIVE
Glucose: NEGATIVE g/dL
Ketones: NEGATIVE mg/dL

## 2010-11-01 LAB — CBC WITH DIFFERENTIAL/PLATELET
BASO%: 0.3 % (ref 0.0–2.0)
EOS%: 0.3 % (ref 0.0–7.0)
HGB: 10.6 g/dL — ABNORMAL LOW (ref 11.6–15.9)
MCH: 35 pg — ABNORMAL HIGH (ref 25.1–34.0)
MCHC: 34.4 g/dL (ref 31.5–36.0)
MONO%: 2.8 % (ref 0.0–14.0)
RBC: 3.03 10*6/uL — ABNORMAL LOW (ref 3.70–5.45)
RDW: 16.8 % — ABNORMAL HIGH (ref 11.2–14.5)
lymph#: 0.8 10*3/uL — ABNORMAL LOW (ref 0.9–3.3)

## 2010-11-01 LAB — COMPREHENSIVE METABOLIC PANEL
ALT: 8 U/L (ref 0–35)
AST: 15 U/L (ref 0–37)
Alkaline Phosphatase: 111 U/L (ref 39–117)
CO2: 27 mEq/L (ref 19–32)
Creatinine, Ser: 0.92 mg/dL (ref 0.50–1.10)
Sodium: 138 mEq/L (ref 135–145)
Total Bilirubin: 0.3 mg/dL (ref 0.3–1.2)
Total Protein: 6.1 g/dL (ref 6.0–8.3)

## 2010-11-01 LAB — LACTATE DEHYDROGENASE: LDH: 132 U/L (ref 94–250)

## 2010-11-04 ENCOUNTER — Other Ambulatory Visit: Payer: Self-pay | Admitting: Internal Medicine

## 2010-11-04 ENCOUNTER — Encounter (HOSPITAL_BASED_OUTPATIENT_CLINIC_OR_DEPARTMENT_OTHER): Payer: Medicare Other | Admitting: Internal Medicine

## 2010-11-04 DIAGNOSIS — D539 Nutritional anemia, unspecified: Secondary | ICD-10-CM

## 2010-11-04 DIAGNOSIS — D649 Anemia, unspecified: Secondary | ICD-10-CM

## 2010-11-04 DIAGNOSIS — Z5112 Encounter for antineoplastic immunotherapy: Secondary | ICD-10-CM

## 2010-11-04 DIAGNOSIS — C649 Malignant neoplasm of unspecified kidney, except renal pelvis: Secondary | ICD-10-CM

## 2010-11-04 DIAGNOSIS — C8589 Other specified types of non-Hodgkin lymphoma, extranodal and solid organ sites: Secondary | ICD-10-CM

## 2010-11-04 DIAGNOSIS — D472 Monoclonal gammopathy: Secondary | ICD-10-CM

## 2010-11-04 DIAGNOSIS — Z5111 Encounter for antineoplastic chemotherapy: Secondary | ICD-10-CM

## 2010-11-04 LAB — CBC WITH DIFFERENTIAL/PLATELET
Basophils Absolute: 0 10*3/uL (ref 0.0–0.1)
EOS%: 0.8 % (ref 0.0–7.0)
Eosinophils Absolute: 0 10*3/uL (ref 0.0–0.5)
HCT: 31.5 % — ABNORMAL LOW (ref 34.8–46.6)
HGB: 10.4 g/dL — ABNORMAL LOW (ref 11.6–15.9)
LYMPH%: 16.1 % (ref 14.0–49.7)
MCH: 33.4 pg (ref 25.1–34.0)
MCV: 101.3 fL — ABNORMAL HIGH (ref 79.5–101.0)
MONO%: 5.8 % (ref 0.0–14.0)
NEUT#: 3.9 10*3/uL (ref 1.5–6.5)
NEUT%: 76.7 % (ref 38.4–76.8)
Platelets: 302 10*3/uL (ref 145–400)
RDW: 16.3 % — ABNORMAL HIGH (ref 11.2–14.5)

## 2010-11-04 LAB — COMPREHENSIVE METABOLIC PANEL
ALT: 9 U/L (ref 0–35)
CO2: 26 mEq/L (ref 19–32)
Calcium: 9.2 mg/dL (ref 8.4–10.5)
Chloride: 105 mEq/L (ref 96–112)
Creatinine, Ser: 0.99 mg/dL (ref 0.50–1.10)
Glucose, Bld: 90 mg/dL (ref 70–99)
Total Bilirubin: 0.3 mg/dL (ref 0.3–1.2)
Total Protein: 6 g/dL (ref 6.0–8.3)

## 2010-11-04 LAB — URINE CULTURE

## 2010-11-04 LAB — LACTATE DEHYDROGENASE: LDH: 123 U/L (ref 94–250)

## 2010-11-05 ENCOUNTER — Encounter (HOSPITAL_BASED_OUTPATIENT_CLINIC_OR_DEPARTMENT_OTHER): Payer: Medicare Other | Admitting: Internal Medicine

## 2010-11-05 DIAGNOSIS — C649 Malignant neoplasm of unspecified kidney, except renal pelvis: Secondary | ICD-10-CM

## 2010-11-05 DIAGNOSIS — C8589 Other specified types of non-Hodgkin lymphoma, extranodal and solid organ sites: Secondary | ICD-10-CM

## 2010-11-11 ENCOUNTER — Other Ambulatory Visit: Payer: Self-pay | Admitting: Internal Medicine

## 2010-11-11 ENCOUNTER — Other Ambulatory Visit (HOSPITAL_BASED_OUTPATIENT_CLINIC_OR_DEPARTMENT_OTHER): Payer: Medicare Other | Admitting: Lab

## 2010-11-11 ENCOUNTER — Telehealth: Payer: Self-pay | Admitting: Internal Medicine

## 2010-11-11 DIAGNOSIS — C8589 Other specified types of non-Hodgkin lymphoma, extranodal and solid organ sites: Secondary | ICD-10-CM

## 2010-11-11 DIAGNOSIS — D539 Nutritional anemia, unspecified: Secondary | ICD-10-CM

## 2010-11-11 DIAGNOSIS — D649 Anemia, unspecified: Secondary | ICD-10-CM

## 2010-11-11 DIAGNOSIS — D472 Monoclonal gammopathy: Secondary | ICD-10-CM

## 2010-11-11 LAB — CBC WITH DIFFERENTIAL/PLATELET
Basophils Absolute: 0 10*3/uL (ref 0.0–0.1)
Eosinophils Absolute: 0 10*3/uL (ref 0.0–0.5)
HGB: 10.8 g/dL — ABNORMAL LOW (ref 11.6–15.9)
MCV: 100.2 fL (ref 79.5–101.0)
MONO#: 0 10*3/uL — ABNORMAL LOW (ref 0.1–0.9)
MONO%: 2.9 % (ref 0.0–14.0)
NEUT#: 0 10*3/uL — CL (ref 1.5–6.5)
RBC: 3.08 10*6/uL — ABNORMAL LOW (ref 3.70–5.45)
RDW: 17.1 % — ABNORMAL HIGH (ref 11.2–14.5)
WBC: 0.3 10*3/uL — CL (ref 3.9–10.3)
lymph#: 0.3 10*3/uL — ABNORMAL LOW (ref 0.9–3.3)

## 2010-11-11 LAB — COMPREHENSIVE METABOLIC PANEL
Albumin: 3.8 g/dL (ref 3.5–5.2)
Alkaline Phosphatase: 80 U/L (ref 39–117)
BUN: 20 mg/dL (ref 6–23)
CO2: 31 mEq/L (ref 19–32)
Calcium: 9.3 mg/dL (ref 8.4–10.5)
Chloride: 96 mEq/L (ref 96–112)
Glucose, Bld: 107 mg/dL — ABNORMAL HIGH (ref 70–99)
Potassium: 4.1 mEq/L (ref 3.5–5.3)
Sodium: 133 mEq/L — ABNORMAL LOW (ref 135–145)
Total Protein: 6 g/dL (ref 6.0–8.3)

## 2010-11-11 NOTE — Telephone Encounter (Signed)
Addended by: Charma Igo on: 11/11/2010 10:56 AM   Modules accepted: Orders

## 2010-11-11 NOTE — Telephone Encounter (Signed)
spoke to pt with results , she denies fever , reports dysphagia. will call pt back after talking to Dr.MKM

## 2010-11-11 NOTE — Telephone Encounter (Signed)
Start cipro 500 mg po bid for 5 days.

## 2010-11-12 ENCOUNTER — Telehealth: Payer: Self-pay | Admitting: Internal Medicine

## 2010-11-12 ENCOUNTER — Inpatient Hospital Stay (HOSPITAL_COMMUNITY)
Admission: EM | Admit: 2010-11-12 | Discharge: 2010-11-15 | DRG: 369 | Disposition: A | Discharge status: Home / Self Care | Payer: Medicare Other | Attending: Internal Medicine | Admitting: Internal Medicine

## 2010-11-12 ENCOUNTER — Emergency Department (HOSPITAL_COMMUNITY): Payer: Medicare Other

## 2010-11-12 ENCOUNTER — Encounter: Payer: Self-pay | Admitting: Student

## 2010-11-12 DIAGNOSIS — T451X5A Adverse effect of antineoplastic and immunosuppressive drugs, initial encounter: Secondary | ICD-10-CM | POA: Diagnosis present

## 2010-11-12 DIAGNOSIS — E46 Unspecified protein-calorie malnutrition: Secondary | ICD-10-CM | POA: Diagnosis present

## 2010-11-12 DIAGNOSIS — E860 Dehydration: Secondary | ICD-10-CM

## 2010-11-12 DIAGNOSIS — I10 Essential (primary) hypertension: Secondary | ICD-10-CM | POA: Diagnosis present

## 2010-11-12 DIAGNOSIS — R5081 Fever presenting with conditions classified elsewhere: Secondary | ICD-10-CM | POA: Diagnosis present

## 2010-11-12 DIAGNOSIS — C8589 Other specified types of non-Hodgkin lymphoma, extranodal and solid organ sites: Secondary | ICD-10-CM | POA: Diagnosis present

## 2010-11-12 DIAGNOSIS — C649 Malignant neoplasm of unspecified kidney, except renal pelvis: Secondary | ICD-10-CM | POA: Diagnosis present

## 2010-11-12 DIAGNOSIS — B3781 Candidal esophagitis: Principal | ICD-10-CM | POA: Diagnosis present

## 2010-11-12 DIAGNOSIS — Z905 Acquired absence of kidney: Secondary | ICD-10-CM

## 2010-11-12 DIAGNOSIS — D630 Anemia in neoplastic disease: Secondary | ICD-10-CM | POA: Diagnosis present

## 2010-11-12 DIAGNOSIS — Z66 Do not resuscitate: Secondary | ICD-10-CM | POA: Diagnosis present

## 2010-11-12 DIAGNOSIS — D702 Other drug-induced agranulocytosis: Secondary | ICD-10-CM | POA: Diagnosis present

## 2010-11-12 DIAGNOSIS — C8330 Diffuse large B-cell lymphoma, unspecified site: Secondary | ICD-10-CM

## 2010-11-12 DIAGNOSIS — B370 Candidal stomatitis: Secondary | ICD-10-CM | POA: Diagnosis present

## 2010-11-12 HISTORY — DX: Anemia, unspecified: D64.9

## 2010-11-12 HISTORY — DX: Cerebral infarction, unspecified: I63.9

## 2010-11-12 HISTORY — DX: Encounter for other specified aftercare: Z51.89

## 2010-11-12 HISTORY — DX: Malignant (primary) neoplasm, unspecified: C80.1

## 2010-11-12 HISTORY — DX: Non-Hodgkin lymphoma, unspecified, unspecified site: C85.90

## 2010-11-12 HISTORY — DX: Pure hypercholesterolemia, unspecified: E78.00

## 2010-11-12 HISTORY — DX: Essential (primary) hypertension: I10

## 2010-11-12 HISTORY — DX: Reserved for inherently not codable concepts without codable children: IMO0001

## 2010-11-12 HISTORY — DX: Hypothyroidism, unspecified: E03.9

## 2010-11-12 HISTORY — DX: Unspecified osteoarthritis, unspecified site: M19.90

## 2010-11-12 LAB — COMPREHENSIVE METABOLIC PANEL
AST: 8 U/L (ref 0–37)
CO2: 28 mEq/L (ref 19–32)
Calcium: 9.1 mg/dL (ref 8.4–10.5)
Chloride: 95 mEq/L — ABNORMAL LOW (ref 96–112)
Creatinine, Ser: 0.89 mg/dL (ref 0.50–1.10)
GFR calc Af Amer: 68 mL/min — ABNORMAL LOW (ref >90)
GFR calc non Af Amer: 59 mL/min — ABNORMAL LOW (ref >90)
Glucose, Bld: 135 mg/dL — ABNORMAL HIGH (ref 70–99)
Total Bilirubin: 0.6 mg/dL (ref 0.3–1.2)

## 2010-11-12 LAB — URINALYSIS, ROUTINE W REFLEX MICROSCOPIC
Bilirubin Urine: NEGATIVE
Nitrite: NEGATIVE
Protein, ur: NEGATIVE mg/dL
Specific Gravity, Urine: 1.01 (ref 1.005–1.030)
Urobilinogen, UA: 0.2 mg/dL (ref 0.0–1.0)

## 2010-11-12 LAB — CBC
HCT: 27.2 % — ABNORMAL LOW (ref 36.0–46.0)
Hemoglobin: 9.6 g/dL — ABNORMAL LOW (ref 12.0–15.0)
MCH: 34 pg (ref 26.0–34.0)
MCHC: 35.3 g/dL (ref 30.0–36.0)
MCV: 96.5 fL (ref 78.0–100.0)
RBC: 2.82 MIL/uL — ABNORMAL LOW (ref 3.87–5.11)

## 2010-11-12 LAB — DIFFERENTIAL
Band Neutrophils: 0 % (ref 0–10)
Basophils Relative: 0 % (ref 0–1)
Eosinophils Relative: 0 % (ref 0–5)
Lymphs Abs: 0 10*3/uL — ABNORMAL LOW (ref 0.7–4.0)
Monocytes Absolute: 0 10*3/uL — ABNORMAL LOW (ref 0.1–1.0)
Neutrophils Relative %: 0 % — ABNORMAL LOW (ref 43–77)
nRBC: 0 /100 WBC

## 2010-11-12 LAB — URINE MICROSCOPIC-ADD ON

## 2010-11-12 LAB — RAPID STREP SCREEN (MED CTR MEBANE ONLY): Streptococcus, Group A Screen (Direct): NEGATIVE

## 2010-11-12 MED ORDER — SODIUM CHLORIDE 0.9 % IV BOLUS (SEPSIS)
1000.0000 mL | Freq: Once | INTRAVENOUS | Status: AC
  Administered 2010-11-12: 1000 mL via INTRAVENOUS

## 2010-11-12 MED ORDER — FLUCONAZOLE IN SODIUM CHLORIDE 200-0.9 MG/100ML-% IV SOLN
200.0000 mg | INTRAVENOUS | Status: DC
  Administered 2010-11-12 – 2010-11-14 (×3): 200 mg via INTRAVENOUS
  Filled 2010-11-12 (×4): qty 100

## 2010-11-12 NOTE — ED Notes (Signed)
In/Out cath documented as completed is error. Not Done.

## 2010-11-12 NOTE — ED Notes (Signed)
Pt has port-a-cath. IV team called to access port and draw labs.

## 2010-11-12 NOTE — ED Notes (Signed)
Patient is resting comfortably. 

## 2010-11-12 NOTE — ED Notes (Signed)
Informed pt and son of plan to admit pt to hospital when room becomes available, as well as move to new ED room.

## 2010-11-12 NOTE — ED Provider Notes (Addendum)
History     CSN: [id number] Arrival date & time: 11/12/2010  1:36 PM   First MD Initiated Contact with Patient 11/12/10 1504      Chief Complaint  Patient presents with  . Sore Throat    (Consider location/radiation/quality/duration/timing/severity/associated sxs/prior treatment) HPI Patient presents with complaint of painful swallowing. She has a history of renal cell carcinoma and non-Hodgkin's lymphoma and finished her second round of chemotherapy one week ago. Since that time she's had pain in her throat and in her chest with swallowing. She has not been able to eat or drink any liquids and the past couple of days. She's had no difficulty breathing or cough. No vomiting or diarrhea. She's been running a low-grade fever of approximately 100. She was advised to come to the ED by her oncologist for evaluation for the rash. He she has not tried anything to make the symptoms better. By mouth intake makes his symptoms worse. There are no other associated systemic symptoms  Past Medical History  Diagnosis Date  . Renal cell carcinoma   . Hypertension   . Hypercholesteremia   . Non Hodgkin's lymphoma     Past Surgical History  Procedure Date  . Nephrectomy     Right Side  . Tonsillectomy   . Portacath placement     right subclavian    History reviewed. No pertinent family history.  History  Substance Use Topics  . Smoking status: Former Games developer  . Smokeless tobacco: Not on file  . Alcohol Use: No    OB History    Grav Para Term Preterm Abortions TAB SAB Ect Mult Living                  Review of Systems ROS reviewed and otherwise negative except for mentioned in HPI  Allergies  Dilaudid  Home Medications   Current Outpatient Rx  Name Route Sig Dispense Refill  . ASPIRIN 81 MG PO TABS Oral Take 81 mg by mouth daily.      . CYANOCOBALAMIN 1000 MCG PO TABS Oral Take 100 mcg by mouth daily.      [name] LEVOTHYROXINE SODIUM 50 MCG PO TABS Oral Take 50 mcg by mouth daily.       . MULTI-VITAMIN/MINERALS PO TABS Oral Take 1 tablet by mouth daily.      [name] NADOLOL 20 MG PO TABS Oral Take 20 mg by mouth daily.      [name] SIMVASTATIN 5 MG PO TABS Oral Take 7 mg by mouth at bedtime.        BP 136/68  Pulse 87  Temp(Src) 99.9 F (37.7 C) (Oral)  Resp 18  Wt 117 lb (53.071 kg)  SpO2 100% Vitals reviewed Physical Exam Physical Examination: General appearance - alert, well appearing, and in no distress Mental status - alert, oriented to person, place, and time Eyes - PERRL, no conunctival pallor, no scleral icterus Mouth - mild erythema of poserior OP, thrush /whitish coating on tongue, mucous membranes tacky Lymphatics - no palpable lymphadenopathy, no hepatosplenomegaly Chest - clear to auscultation, no wheezes, rales or rhonchi, symmetric air entry, port in right upper chest- nontender, no surrounding erythema Heart - normal rate, regular rhythm, normal S1, S2, no murmurs, rubs, clicks or gallops Abdomen - soft, nontender, nondistended, no masses or organomegaly Neurological - motor and sensory grossly normal bilaterally Musculoskeletal - no joint tenderness, deformity or swelling Extremities - peripheral pulses normal, no pedal edema, no clubbing or cyanosis Skin - normal coloration and turgor,  no rashes, no suspicious skin lesions noted  ED Course  Procedures (including critical care time)   Labs Reviewed  CBC  DIFFERENTIAL  COMPREHENSIVE METABOLIC PANEL  URINALYSIS, ROUTINE W REFLEX MICROSCOPIC   No results found.   No diagnosis found.  7:41 PM D/w [facility] for admission- they will see in ED- Will admit to [facility], Team 7, med/surg bed  MDM  Patient with painful swallowing consistent with a likely esophagitis due to being on chemotherapy and immunosuppression. She does appear to have coating on her tongue consistent with thrush. She's been running low-grade temperature proximally 100. She has had poor fluid intake as well. She is neutropenic on workup  here today in the ED. She was given IV fluconazole. Her temperature did increase in the ED to 100.4 from 99.9 initially. Blood cultures and urine cultures were obtained. We are also giving her IV hydration while in the ED. Patient will be admitted to try hospitalist for further management.       Ethelda Chick, MD 11/12/10 1941  Ethelda Chick, MD 11/12/10 1610

## 2010-11-12 NOTE — ED Notes (Signed)
Port accessed by Kerman Passey RN.  Port accessed using sterile technique.  Blood return.  Port flushed with NS.

## 2010-11-12 NOTE — ED Notes (Signed)
MD informed of temperature of 100.4 F

## 2010-11-12 NOTE — ED Notes (Signed)
Family at bedside. 

## 2010-11-12 NOTE — Telephone Encounter (Signed)
Routed to Dr. Normajean Glasgow

## 2010-11-12 NOTE — ED Notes (Signed)
Pt in with c/o thrush in mouth and throat. Reports pain down entire esophagus. Pt also is here for evaluation from advice of PCP oncologist. Decreased po intake x several days. Last chemo 1 week ago.

## 2010-11-12 NOTE — ED Notes (Signed)
Pt WBC 0.4 and pt had Chemo yesterday. Dr. Karma Ganja ordered In/Out Cath. Informed charge Building surveyor. Agreed with management with not performing task r/t neutropenia precautions. Informed Dr. Karma Ganja with concern matter and decision that was made. MD wants a sample of urine.

## 2010-11-12 NOTE — ED Notes (Signed)
Informed Dr. Casper Harrison concerning pt's WBC 0.4. No new orders at this time.

## 2010-11-12 NOTE — Telephone Encounter (Signed)
Spoke to Dr. Donnald Garre and instructed pt to go to ED for evaluation per Dr Donnald Garre . Pt said she would go to ED.

## 2010-11-12 NOTE — ED Notes (Signed)
Attempted to pull blood sample from port.  Unable to get blood return.  Still able to infuse

## 2010-11-12 NOTE — ED Notes (Signed)
RN at bedside getting labs from port.

## 2010-11-13 ENCOUNTER — Encounter (HOSPITAL_COMMUNITY): Payer: Self-pay | Admitting: *Deleted

## 2010-11-13 ENCOUNTER — Other Ambulatory Visit: Payer: Self-pay | Admitting: Internal Medicine

## 2010-11-13 DIAGNOSIS — D702 Other drug-induced agranulocytosis: Secondary | ICD-10-CM

## 2010-11-13 DIAGNOSIS — C8330 Diffuse large B-cell lymphoma, unspecified site: Secondary | ICD-10-CM

## 2010-11-13 DIAGNOSIS — R5081 Fever presenting with conditions classified elsewhere: Secondary | ICD-10-CM

## 2010-11-13 DIAGNOSIS — C8589 Other specified types of non-Hodgkin lymphoma, extranodal and solid organ sites: Secondary | ICD-10-CM

## 2010-11-13 DIAGNOSIS — D649 Anemia, unspecified: Secondary | ICD-10-CM

## 2010-11-13 LAB — BASIC METABOLIC PANEL
BUN: 12 mg/dL (ref 6–23)
CO2: 28 mEq/L (ref 19–32)
Calcium: 8.7 mg/dL (ref 8.4–10.5)
Chloride: 103 mEq/L (ref 96–112)
Creatinine, Ser: 0.85 mg/dL (ref 0.50–1.10)
GFR calc Af Amer: 72 mL/min — ABNORMAL LOW (ref >90)
GFR calc non Af Amer: 62 mL/min — ABNORMAL LOW (ref >90)
Glucose, Bld: 89 mg/dL (ref 70–99)
Potassium: 3.4 mEq/L — ABNORMAL LOW (ref 3.5–5.1)
Sodium: 134 mEq/L — ABNORMAL LOW (ref 135–145)

## 2010-11-13 LAB — CBC
HCT: 20.9 % — ABNORMAL LOW (ref 36.0–46.0)
Hemoglobin: 7.1 g/dL — ABNORMAL LOW (ref 12.0–15.0)
MCH: 33.5 pg (ref 26.0–34.0)
MCHC: 34 g/dL (ref 30.0–36.0)
MCV: 98.6 fL (ref 78.0–100.0)
Platelets: 63 10*3/uL — ABNORMAL LOW (ref 150–400)
RBC: 2.12 MIL/uL — ABNORMAL LOW (ref 3.87–5.11)
RDW: 15.5 % (ref 11.5–15.5)
WBC: 0.6 10*3/uL — CL (ref 4.0–10.5)

## 2010-11-13 LAB — URINE CULTURE
Colony Count: NO GROWTH
Culture  Setup Time: 201211070148
Culture: NO GROWTH

## 2010-11-13 LAB — PREPARE RBC (CROSSMATCH)

## 2010-11-13 LAB — MAGNESIUM: Magnesium: 2 mg/dL (ref 1.5–2.5)

## 2010-11-13 MED ORDER — ACETAMINOPHEN 325 MG PO TABS: 650.0000 mg | ORAL_TABLET | Freq: Four times a day (QID) | ORAL | Status: DC | PRN

## 2010-11-13 MED ORDER — SODIUM CHLORIDE 0.9 % IV SOLN
INTRAVENOUS | Status: DC
  Administered 2010-11-13 (×2): via INTRAVENOUS
  Filled 2010-11-13 (×5): qty 1000

## 2010-11-13 MED ORDER — ALBUTEROL SULFATE (5 MG/ML) 0.5% IN NEBU: 2.5000 mg | INHALATION_SOLUTION | RESPIRATORY_TRACT | Status: DC | PRN

## 2010-11-13 MED ORDER — ACETAMINOPHEN 325 MG PO TABS
650.0000 mg | ORAL_TABLET | Freq: Once | ORAL | Status: AC
  Administered 2010-11-13: 325 mg via ORAL
  Filled 2010-11-13: qty 2

## 2010-11-13 MED ORDER — ONDANSETRON HCL 4 MG PO TABS: 4.0000 mg | ORAL_TABLET | Freq: Four times a day (QID) | ORAL | Status: DC | PRN

## 2010-11-13 MED ORDER — DIPHENHYDRAMINE HCL 50 MG/ML IJ SOLN
25.0000 mg | Freq: Once | INTRAMUSCULAR | Status: AC
  Administered 2010-11-13: 25 mg via INTRAVENOUS
  Filled 2010-11-13: qty 1

## 2010-11-13 MED ORDER — ONDANSETRON HCL 4 MG/2ML IJ SOLN: 4.0000 mg | Freq: Four times a day (QID) | INTRAMUSCULAR | Status: DC | PRN

## 2010-11-13 MED ORDER — BISACODYL 10 MG RE SUPP: 10.0000 mg | Freq: Every day | RECTAL | Status: DC | PRN

## 2010-11-13 MED ORDER — FLEET ENEMA 7-19 GM/118ML RE ENEM: 1.0000 | ENEMA | Freq: Every day | RECTAL | Status: DC | PRN

## 2010-11-13 MED ORDER — MOXIFLOXACIN HCL IN NACL 400 MG/250ML IV SOLN
400.0000 mg | INTRAVENOUS | Status: DC
  Administered 2010-11-13 – 2010-11-15 (×3): 400 mg via INTRAVENOUS
  Filled 2010-11-13 (×4): qty 250

## 2010-11-13 MED ORDER — ACETAMINOPHEN 650 MG RE SUPP: 650.0000 mg | Freq: Four times a day (QID) | RECTAL | Status: DC | PRN

## 2010-11-13 MED ORDER — POTASSIUM CHLORIDE IN NACL 20-0.9 MEQ/L-% IV SOLN
INTRAVENOUS | Status: DC
  Administered 2010-11-14: 23:00:00 via INTRAVENOUS
  Administered 2010-11-14: 1000 mL via INTRAVENOUS
  Filled 2010-11-13 (×6): qty 1000

## 2010-11-13 MED ORDER — ACETAMINOPHEN 160 MG/5ML PO SOLN
ORAL | Status: AC
  Administered 2010-11-13: 325 mg via ORAL
  Filled 2010-11-13: qty 20.3

## 2010-11-13 MED ORDER — ENOXAPARIN SODIUM 40 MG/0.4ML ~~LOC~~ SOLN
40.0000 mg | SUBCUTANEOUS | Status: DC
  Filled 2010-11-13 (×2): qty 0.4

## 2010-11-13 NOTE — Consult Note (Signed)
REASON FOR CONSULTATION:  77 years old white female with non-Hodgkin lymphoma presented with neutropenic fever.  HPI Melissa Cross is a 77 y.o. female recently diagnosed with:  1. Non-Hodgkin's lymphoma diagnosed on bone marrow biopsy in June of 2012, currently on systemic chemotherapy with CHOP Rituxan for non-Hodgkin's lymphoma status post 2 cycle, last cycle was given in 9 days ago. 2. Stage III (T3a N0 M0) renal cell carcinoma diagnosed in June of 2012, Status post right nephrectomy under the care of Dr. Retta Diones on 09/02/2010. The patient presented to the emergency department yesterday with 3 days history of fatigue, weakness, low grade fever, sore throat and difficulty swallowing. She was admitted by the hospitalist service for evaluation and treatment of her condition. She is feeling a little bit better today. She denied having any significant fever this morning. She still has significant weakness and is very pale. The patient was started on treatment with Diflucan for oral thrush. She is not currently on any antibiotics.    Past Medical History  Diagnosis Date  . Renal cell carcinoma   . Hypertension   . Hypercholesteremia   . Non Hodgkin's lymphoma     Past Surgical History  Procedure Date  . Nephrectomy     Right Side  . Tonsillectomy   . Portacath placement     right subclavian    History reviewed. No pertinent family history.  Social History History  Substance Use Topics  . Smoking status: Former Games developer  . Smokeless tobacco: Not on file  . Alcohol Use: No    Allergies  Allergen Reactions  . Dilaudid (Hydromorphone Hcl) Other (See Comments)    Hallucinations and nausea    Current Facility-Administered Medications  Medication Dose Route Frequency Provider Last Rate Last Dose  . acetaminophen (TYLENOL) tablet 650 mg  650 mg Oral Q6H PRN Buena Irish, MD       Or  . acetaminophen (TYLENOL) suppository 650 mg  650 mg Rectal Q6H PRN Buena Irish, MD       . albuterol (PROVENTIL) (5 MG/ML) 0.5% nebulizer solution 2.5 mg  2.5 mg Nebulization Q2H PRN Buena Irish, MD      . bisacodyl (DULCOLAX) suppository 10 mg  10 mg Rectal Daily PRN Buena Irish, MD      . fluconazole (DIFLUCAN) IVPB 200 mg  200 mg Intravenous Q24H Ethelda Chick, MD   200 mg at 11/12/10 1811  . ondansetron (ZOFRAN) tablet 4 mg  4 mg Oral Q6H PRN Buena Irish, MD       Or  . ondansetron [facility]) injection 4 mg  4 mg Intravenous Q6H PRN Buena Irish, MD      . sodium chloride 0.9 % 1,000 mL with potassium chloride 20 mEq infusion   Intravenous Continuous Buena Irish, MD 100 mL/hr at 11/13/10 0435    . sodium chloride 0.9 % bolus 1,000 mL  1,000 mL Intravenous Once Ethelda Chick, MD   1,000 mL at 11/12/10 1657  . sodium chloride 0.9 % bolus 1,000 mL  1,000 mL Intravenous Once Ethelda Chick, MD   1,000 mL at 11/12/10 1830  . sodium phosphate (FLEET) 7-19 GM/118ML enema 1 enema  1 enema Rectal Daily PRN Buena Irish, MD      . DISCONTD: enoxaparin (LOVENOX) injection 40 mg  40 mg Subcutaneous Q24H Buena Irish, MD        Review of Systems  A comprehensive review of systems was negative except for: Constitutional: positive for fatigue and fevers  Ears, nose, mouth, throat, and face: positive for sore mouth and sore throat Hematologic/lymphatic: positive for Anemia.  Physical Exam  ZOX:WRUEA, no distress and pale SKIN: no rashes or significant lesions HEAD: Normocephalic, No masses, lesions, tenderness or abnormalities EYES: normal, Conjunctiva are pink and non-injected, sclera clear EARS: External ears normal OROPHARYNX:mild erythema, mucous membranes are moist and thrush  NECK: supple, no adenopathy LYMPH:  no palpable lymphadenopathy BREAST:not examined LUNGS: clear to auscultation and percussion HEART: regular rate & rhythm ABDOMEN:abdomen soft, non-tender and normal bowel sounds BACK: Back symmetric, no  curvature. EXTREMITIES:no edema, no clubbing, no cyanosis  NEURO: alert & oriented x 3 with fluent speech    Studies/Results: Dg Chest 2 View  11/12/2010  *RADIOLOGY REPORT*  Clinical Data: Chest pain with difficulty swallowing.  History of hypertension.  History of non-Hodgkins lymphoma.  CHEST - 2 VIEW  Comparison: Portable chest 09/02/2010.  PET CT 06/24/2010.  Findings: Right IJ Port-A-Cath tip is at the SVC right atrial junction.  Heart size and mediastinal contours are stable.  There is mild patient rotation to the right.  The lungs are clear and there is no pleural effusion. Degenerative changes in the upper lumbar spine appear unchanged.  IMPRESSION: No active cardiopulmonary process.  Original Report Authenticated By: Gerrianne Scale, M.D.     Assessment and Plan: 1) neutropenic fever secondary to her recent treatment with chemotherapy with CHOP/R. The patient received Neulasta after her treatment. Was started on neutropenic precautions and diet. Consider stopping the patient on antibiotic coverage with Avelox.  2) Anemia: I would although to units of packed RBC transfusion today.  3) Non-Hodgkin's lymphoma diagnosed on bone marrow biopsy in June of 2012, currently on systemic chemotherapy with CHOP Rituxan for non-Hodgkin's lymphoma status post 2 cycle, last cycle was given in 9 days ago.  4) Stage III (T3a N0 M0) renal cell carcinoma diagnosed in June of 2012, Status post right nephrectomy under the care of Dr. Retta Diones on 09/02/2010.   All questions were answered. The patient knows to call the clinic with any problems, questions or concerns. We can certainly see the patient much sooner if necessary.  Thank you so much for allowing me to participate in the care of Melissa Cross. I will continue to follow up the patient with you and assist in her care.   Deaun Rocha K. 11/13/2010, 9:04 AM

## 2010-11-13 NOTE — Progress Notes (Signed)
0540--11/7--paged md dr Gasper Sells about platelet count of 87 yesterday--?start lovenox or not??--Melissa Cross 0605--11/7 --dr Gasper Sells called back ordered to hold lovenox  Since platelets are low--no other VTE at present--md stated patient does not need since platelets are "low" --Melissa Cross

## 2010-11-13 NOTE — H&P (Signed)
Hospital Admission Note Date: 11/13/2010  Patient name: Melissa Cross Medical record number: [id number] Date of birth: 18-Jun-1925 Age: 77 y.o. Gender: female PCP: Provider Not In System  Chief Complaint: Pain with swallowing  History of Present Illness: Ms. Melissa Cross is a very pleasant 77 year old woman with lymphoma who presents to the emergency department today because of severe pain with swallowing. This has been going on for 2 days and she's been unable to keep anything down. The patient last had chemotherapy 8 days ago.  She says she noticed that she felt roughness on the roof of her mouth since that time.  She's never had an official diagnosis of thrush before.  The patient received a call yesterday telling her that her white blood cell count was 0 and that she should notify the Cancer Center if she develops a fever. She does report that the pain starts from the base of her neck and goes down into her chest.   She has pain when she swallows and sometimes when she moves.  She is not vomiting or coughing up any blood. She believes she is able to swallow and she just can't do it because of the pain.  Meds: Prescriptions prior to admission  Medication Sig Dispense Refill  . allopurinol (ZYLOPRIM) 100 MG tablet Take 100 mg by mouth 2 (two) times daily.       [name] aspirin 81 MG tablet Take 81 mg by mouth daily.       . cholecalciferol (VITAMIN D) 400 UNITS TABS Take 400 Units by mouth daily.        . ciprofloxacin (CIPRO) 500 MG tablet Take 500 mg by mouth 2 (two) times daily.        . cyanocobalamin 1000 MCG tablet Take 100 mcg by mouth daily.        . Garlic 1000 MG CAPS Take 1,000 mg by mouth daily.        . Ginger, Zingiber officinalis, (GINGER ROOT) 550 MG CAPS Take 550 mg by mouth daily.        [name] levothyroxine (SYNTHROID, LEVOTHROID) 50 MCG tablet Take 50 mcg by mouth daily.        . Multiple Vitamins-Minerals (MULTIVITAMIN WITH MINERALS) tablet Take 1 tablet by mouth daily.        . nadolol  (CORGARD) 20 MG tablet Take 20 mg by mouth daily.        . simvastatin (ZOCOR) 5 MG tablet Take 7 mg by mouth at bedtime.         Allergies: Dilaudid Past Medical History  Diagnosis Date  . Renal cell carcinoma   . Hypertension   . Hypercholesteremia   . Non Hodgkin's lymphoma    Past Surgical History  Procedure Date  . Nephrectomy     Right Side  . Tonsillectomy   . Portacath placement     right subclavian   History reviewed. No pertinent family history. History   Social History  . Marital Status: Single    Spouse Name: N/A    Number of Children: N/A  . Years of Education: N/A   Occupational History  . Not on file.   Social History Main Topics  . Smoking status: Former Games developer  . Smokeless tobacco: Not on file  . Alcohol Use: No  . Drug Use:   . Sexually Active:    Other Topics Concern  . Not on file   Social History Narrative  . No narrative on file   Review of Systems:  Review of Systems  Constitutional: Positive for weight loss and malaise/fatigue. Negative for fever and chills.  HENT: Positive for sore throat. Negative for nosebleeds, congestion and ear discharge.   Eyes: Negative for blurred vision, double vision, pain and redness.  Respiratory: Negative for hemoptysis, wheezing and stridor.   Cardiovascular: Positive for chest pain and leg swelling.  Gastrointestinal: Positive for heartburn and constipation. Negative for diarrhea.  Neurological: Negative for seizures, loss of consciousness and headaches.  Endo/Heme/Allergies: Negative for polydipsia.  All other systems reviewed and are negative.   Physical Exam: Blood pressure 116/43, pulse 80, temperature 100.4 F (38 C), temperature source Oral, resp. rate 16, weight 53.071 kg (117 lb), SpO2 100.00%.   Physical Exam  Constitutional: She is oriented to person, place, and time. She appears malnourished. She appears unhealthy.       Cachectic, frail  HENT:  Head: Normocephalic and atraumatic.    Nose: Nose normal.       White discharge on tongue.  No erythema   Eyes: Conjunctivae and EOM are normal. Pupils are equal, round, and reactive to light. Right eye exhibits no discharge. Left eye exhibits no discharge. No scleral icterus.  Neck: Normal range of motion. Neck supple. No JVD present. No tracheal deviation present. No thyromegaly present.  Cardiovascular: Normal rate and regular rhythm.  Exam reveals no gallop and no friction rub.   Murmur heard. Pulmonary/Chest: Breath sounds normal. No stridor. No respiratory distress. She has no wheezes. She has no rales.  Abdominal: Soft. Bowel sounds are normal. She exhibits no distension and no mass. There is no tenderness. There is no rebound and no guarding.  Musculoskeletal: She exhibits no edema and no tenderness.  Neurological: She is alert and oriented to person, place, and time. No cranial nerve deficit. She exhibits normal muscle tone. Gait normal. Coordination normal. GCS score is 15.  Skin: Skin is warm and dry. No rash noted. No erythema. There is pallor.  Psychiatric: Mood, memory, affect and judgment normal.   Lab results:  Component     Latest Ref Rng 11/12/2010  Sodium     135 - 145 mEq/L 130 (L)  Potassium     3.5 - 5.1 mEq/L 3.4 (L)  Chloride     96 - 112 mEq/L 95 (L)  CO2     19 - 32 mEq/L 28  Glucose     70 - 99 mg/dL 132 (H)  BUN     6 - 23 mg/dL 17  Creat     4.40 - 1.02 mg/dL 7.25  Calcium     8.4 - 10.5 mg/dL 9.1  Total Protein     6.0 - 8.3 g/dL 5.9 (L)  Albumin     3.5 - 5.2 g/dL 3.0 (L)  AST     0 - 37 U/L 8  ALT     0 - 35 U/L 8  Alkaline Phosphatase     39 - 117 U/L 81  Total Bilirubin     0.3 - 1.2 mg/dL 0.6  GFR calc non Af Amer     >90 mL/min 59 (L)  GFR calc Af Amer     >90 mL/min 68 (L)  Neutrophils Relative     43 - 77 % 0 (L)  Lymphocytes Relative     12 - 46 % 0 (L)  Monocytes Relative     3 - 12 % 0 (L)  Eosinophils Relative     0 - 5 % 0  Basophils  Relative     0 - 1 %  0  Band Neutrophils  #1 odontophagia    0 - 10 % 0  nRBC     0 /100 WBC 0  Lymphocytes Absolute     0.7 - 4.0 K/uL 0.0 (L)  Monocytes Absolute     0.1 - 1.0 K/uL 0.0 (L)  Eosinophils Absolute     0.0 - 0.7 K/uL 0.0  Basophils Absolute     0.0 - 0.1 K/uL 0.0  WBC Morphology      TOO FEW TO COUNT, SMEAR AVAILABLE FOR REVIEW  Color, Urine     YELLOW YELLOW  Appearance     CLEAR CLEAR  Specific Gravity, Urine     1.005 - 1.030 1.010  pH     5.0 - 8.0 6.5  Glucose, UA     NEGATIVE mg/dL NEGATIVE  Hgb urine dipstick     NEGATIVE TRACE (A)  Bilirubin Urine     NEGATIVE NEGATIVE  Ketones, ur     NEGATIVE mg/dL NEGATIVE  Protein     NEGATIVE mg/dL NEGATIVE  Urobilinogen, UA     0.0 - 1.0 mg/dL 0.2  Nitrite     NEGATIVE NEGATIVE  Leukocytes, UA     NEGATIVE NEGATIVE  WBC     4.0 - 10.5 K/uL 0.4 (LL)  RBC     3.87 - 5.11 MIL/uL 2.82 (L)  HGB     12.0 - 15.0 g/dL 9.6 (L)  HCT     16.1 - 46.0 % 27.2 (L)  MCV     78.0 - 100.0 fL 96.5  MCH     26.0 - 34.0 pg 34.0  MCHC     30.0 - 36.0 g/dL 09.6  RDW     04.5 - 40.9 % 15.4  Platelets     150 - 400 K/uL 87 (L)  Squamous Epithelial / LPF     RARE RARE  RBC / HPF     <3 RBC/hpf 0-2  Streptococcus, Group A Screen (Direct)     NEGATIVE NEGATIVE  Glucose-Capillary     70 - 99 mg/dL 811 (H)     Imaging results:  Dg Chest 2 View  11/12/2010  *RADIOLOGY REPORT*  Clinical Data: Chest pain with difficulty swallowing.  History of hypertension.  History of non-Hodgkins lymphoma.  CHEST - 2 VIEW  Comparison: Portable chest 09/02/2010.  PET CT 06/24/2010.  Findings: Right IJ Port-A-Cath tip is at the SVC right atrial junction.  Heart size and mediastinal contours are stable.  There is mild patient rotation to the right.  The lungs are clear and there is no pleural effusion. Degenerative changes in the upper lumbar spine appear unchanged.  IMPRESSION: No active cardiopulmonary process.  Original Report Authenticated By: Gerrianne Scale, M.D.    Assessment & Plan by Problem  #1. Odynophagia is an unfortunate 77 year old woman with history of of renal cell carcinoma followed by a lymphadenopathy presents tonight with severe odynophagia which patient has visible thrush in her mouth and I suspect the plan is to put her on IV Diflucan she is at high risk for this because she has been on prednisone. I patient is not improve his quickly would certainly consider GI consultation.  #2 dehydration as evidenced by hyponatremia. He placed on IV fluids and we will follow  #3.  Hypokalemia likely secondary to dehydration which is secondary to by mouth intake we'll replace and follow.  #4 neutropenia. The patient is neutropenic  now 8 days after her last chemotherapy. Her maximum temperature is 100.4 does not have any clear sign of infection other than this esophagitis. We will monitor her temperature very closely but I will hold off on starting antibiotics at this time we will however send blood cultures and urine culture.  #5. Anemia and thrombocytopenia it is also likely a result of her chemotherapy will consult her oncologist and follow.  #6. Lymphoma currently undergoing chemotherapy will consult her oncologist.  #7. Malnutrition as evidenced by high albuminemia the patient is not able to swallow at this time on full nutrition once she has improved the  #8. CODE STATUS I did discuss the patient's CODE STATUS at length she was very clear that she would like to be DO NOT RESUSCITATE we'll make a note of this in the orders.   There is no problem list on file for this patient.    Code status:  DNR  Greater than 70 minutes was spent on direct patient care and coordination of care.  Please fax this note to primary provider  Yechezkel Fertig 11/13/2010, 1:59 AM

## 2010-11-13 NOTE — Progress Notes (Signed)
Subjective: Feels marginally better this morning. Odynophagia is less. No fever or chills, no shortness of breath.  Objective: Vital signs in last 24 hours: Temp:  [98.5 F (36.9 C)-100.4 F (38 C)] 98.5 F (36.9 C) (11/07 0618) Pulse Rate:  [70-87] 76  (11/07 0618) Resp:  [16-18] 16  (11/07 0618) BP: (110-136)/(43-68) 129/61 mmHg (11/07 0618) SpO2:  [96 %-100 %] 98 % (11/07 0618) Weight:  [52.2 kg (115 lb 1.3 oz)-53.071 kg (117 lb)] 115 lb 1.3 oz (52.2 kg) (11/07 0140) Weight change:  Last BM Date: 11/13/10  Intake/Output from previous day: 11/06 0701 - 11/07 0700 In: 600 [I.V.:600] Out: -  Total I/O In: 0  Out: 600 [Urine:600]   Physical Exam:  General: Comfortable, alert, communicative, fully oriented, not short of breath at rest.  HEENT:  Moderate clinical pallor, no jaundice, no conjunctival injection or discharge. Throat, mild thrush. NECK:  Supple, JVP not seen, no carotid bruits, no palpable goiter. CHEST:  Clinically clear to auscultation, no wheezes, no crackles. HEART:  Sounds 1 and 2 heard, normal, regular, no murmurs. ABDOMEN:  Full, soft, non-tender. Patient is sitting, so unable to palpate organs. Normal bowel sounds. LOWER EXTREMITIES:  No pitting edema, palpable peripheral pulses. MUSCULOSKELETAL SYSTEM:  Generalized osteoarthritic changes, otherwise, normal. CENTRAL NERVOUS SYSTEM:  No focal neurologic deficit on gross examination.  Lab Results:  [facility] 11/13/10 0620 11/12/10 1640  WBC 0.6* 0.4*  HGB 7.1* 9.6*  HCT 20.9* 27.2*  PLT 63* 87*    Basename 11/13/10 0620 11/12/10 1640  NA 134* 130*  K 3.4* 3.4*  CL 103 95*  CO2 28 28  GLUCOSE 89 135*  BUN 12 17  CREATININE 0.85 0.89  CALCIUM 8.7 9.1   Recent Results (from the past 240 hour(s))  RAPID STREP SCREEN     Status: Normal   Collection Time   11/12/10  4:00 PM      Component Value Range Status Comment   Streptococcus, Group A Screen (Direct) NEGATIVE  NEGATIVE  Final       Studies/Results: Dg Chest 2 View  11/12/2010  *RADIOLOGY REPORT*  Clinical Data: Chest pain with difficulty swallowing.  History of hypertension.  History of non-Hodgkins lymphoma.  CHEST - 2 VIEW  Comparison: Portable chest 09/02/2010.  PET CT 06/24/2010.  Findings: Right IJ Port-A-Cath tip is at the SVC right atrial junction.  Heart size and mediastinal contours are stable.  There is mild patient rotation to the right.  The lungs are clear and there is no pleural effusion. Degenerative changes in the upper lumbar spine appear unchanged.  IMPRESSION: No active cardiopulmonary process.  Original Report Authenticated By: Gerrianne Scale, M.D.    Medications: Scheduled Meds:   . acetaminophen  650 mg Oral Once  . diphenhydrAMINE  25 mg Intravenous Once  . fluconazole (DIFLUCAN) IV  200 mg Intravenous Q24H  . moxifloxacin  400 mg Intravenous Q24H  . sodium chloride  1,000 mL Intravenous Once  . sodium chloride  1,000 mL Intravenous Once  . DISCONTD: enoxaparin  40 mg Subcutaneous Q24H   Continuous Infusions:   . sodium chloride 0.9 % 1,000 mL with potassium chloride 20 mEq infusion 100 mL/hr at 11/13/10 0435   PRN Meds:.acetaminophen, acetaminophen, albuterol, bisacodyl, ondansetron (ZOFRAN) IV, ondansetron, sodium phosphate  Assessment/Plan:  Active Problems: 1. Odynophagia: This appears to be secondary to candidal esophagitis, against a background of immunosuppression by chemotherapy, malignant disease, as well as steroid treatment. Now on day # 2 of Diflucan, and  has already noted some improvement. Other etiologies include H. Simplex esophagitis, but this possibility will only be explored, if symptoms persist. 2. Oral thrush: See above. 3. Neutropenia/Anemia. Due to chemotherapy and malignant disease. Manage per Dr. Arbutus Ped (Primary Oncologist). Blood transfusion has been recommended, as well as Avelox. Perhaps, Neupogen or equivalent may prove necessary. 4. HTN: Controlled.   5.Lymphoma, large-cell, diffuse/RCC: Per Oncologist.    LOS: 1 day   Melissa Cross,CHRISTOPHER 11/13/2010, 12:47 PM

## 2010-11-13 NOTE — Telephone Encounter (Signed)
done

## 2010-11-13 NOTE — ED Notes (Signed)
Pt received from [facility] at 2330

## 2010-11-14 LAB — CBC
HCT: 30.6 % — ABNORMAL LOW (ref 36.0–46.0)
RBC: 3.29 MIL/uL — ABNORMAL LOW (ref 3.87–5.11)
RDW: 19.7 % — ABNORMAL HIGH (ref 11.5–15.5)
WBC: 2.3 10*3/uL — ABNORMAL LOW (ref 4.0–10.5)

## 2010-11-14 LAB — DIFFERENTIAL
Band Neutrophils: 9 % (ref 0–10)
Basophils Absolute: 0 10*3/uL (ref 0.0–0.1)
Eosinophils Absolute: 0 10*3/uL (ref 0.0–0.7)
Eosinophils Relative: 0 % (ref 0–5)
Lymphocytes Relative: 20 % (ref 12–46)
Monocytes Relative: 7 % (ref 3–12)

## 2010-11-14 MED ORDER — BOOST PLUS PO LIQD
237.0000 mL | Freq: Two times a day (BID) | ORAL | Status: DC
  Filled 2010-11-14 (×7): qty 237

## 2010-11-14 NOTE — Progress Notes (Signed)
INITIAL ADULT NUTRITION ASSESSMENT Date: 11/14/2010   Time: 11:02 AM Reason for Assessment: MD consult  ASSESSMENT: Female 77 y.o.  Dx: Pain with swallowing  Hx:  Past Medical History  Diagnosis Date  . Renal cell carcinoma   . Hypertension   . Hypercholesteremia   . Non Hodgkin's lymphoma   . Glaucoma   . Arthritis   . Anemia   . Stroke     hx of TIA  . Blood transfusion   . Hypothyroidism   . Hypothyroid     on synthroid   . Cancer     kidney cancer, NHL   . Cancer     last chemo 11/04/10    Related Meds: Scheduled Meds:   . acetaminophen      . acetaminophen  650 mg Oral Once  . Boost Plus  237 mL Oral BID BM  . diphenhydrAMINE  25 mg Intravenous Once  . fluconazole (DIFLUCAN) IV  200 mg Intravenous Q24H  . moxifloxacin  400 mg Intravenous Q24H   Continuous Infusions:   . 0.9 % NaCl with KCl 20 mEq / L 100 mL/hr at 11/14/10 0630  . DISCONTD: sodium chloride 0.9 % 1,000 mL with potassium chloride 20 mEq infusion 100 mL/hr at 11/13/10 1624   PRN Meds:.acetaminophen, acetaminophen, albuterol, bisacodyl, ondansetron (ZOFRAN) IV, ondansetron, sodium phosphate  Ht: 5\' 3"  (160 cm)  Wt: 115 lb 1.3 oz (52.2 kg)  Ideal Wt: 52.4 kg  % Ideal Wt: 99  Usual Wt: 65.9kg % Usual Wt: 79  Body mass index is 20.39 kg/(m^2).  Food/Nutrition Related Hx: Pt reports being unable to eat since Sunday night r/t pain with swallowing. Pt states she unintentionally lost 30 pounds in March which she attributes to having cancer, however states that her weight has been stable since August. Pt currently on systemic chemotherapy. Pt reports drinking Boost at home. Pt reports improvement in pain with swallowing today and states that the Diflucan is helping. Pt reports eating a good breakfast on full liquid diet, only states that it took her a little longer to swallow the foods.   Labs:  CMP     Component Value Date/Time   NA 134* 11/13/2010 0620   K 3.4* 11/13/2010 0620   CL 103  11/13/2010 0620   CO2 28 11/13/2010 0620   GLUCOSE 89 11/13/2010 0620   BUN 12 11/13/2010 0620   CREATININE 0.85 11/13/2010 0620   CALCIUM 8.7 11/13/2010 0620   PROT 5.9* 11/12/2010 1640   ALBUMIN 3.0* 11/12/2010 1640   AST 8 11/12/2010 1640   ALT 8 11/12/2010 1640   ALKPHOS 81 11/12/2010 1640   BILITOT 0.6 11/12/2010 1640   GFRNONAA 62* 11/13/2010 0620   GFRAA 72* 11/13/2010 0620   CBG (last 3)   Basename 11/12/10 1634  GLUCAP 130*    Intake/Output Summary (Last 24 hours) at 11/14/10 1122 Last data filed at 11/14/10 0630  Gross per 24 hour  Intake 2975.26 ml  Output   2401 ml  Net 574.26 ml    Diet Order: Full Liquid  IVF:    0.9 % NaCl with KCl 20 mEq / L Last Rate: 100 mL/hr at 11/14/10 0630  DISCONTD: sodium chloride 0.9 % 1,000 mL with potassium chloride 20 mEq infusion Last Rate: 100 mL/hr at 11/13/10 1624    Estimated Nutritional Needs:   Kcal:1550-1850 Protein:65-80g Fluid:1.5-1.8L  NUTRITION DIAGNOSIS: -Predicted suboptimal energy intake (NI-1.6).  Status: Ongoing -Pt meets criteria for severe PCM of chronic illness AEB 20%  weight loss in past 8 months and severe muscle wasting.  RELATED TO: pain with swallowing   AS EVIDENCE BY: pt statement  MONITORING/EVALUATION(Goals):  1. Pt able to consume >90% of meals and supplements.  2. Advance diet as tolerated to mechanical soft diet.   EDUCATION NEEDS: -No education needs identified at this time  INTERVENTION: Ordered Boost Plus BID sent in between meals. Encouraged gradual increased intake. Will monitor intake.   Dietitian # [id number]  DOCUMENTATION CODES Per approved criteria  -Severe malnutrition in the context of chronic illness    Marshall Cork 11/14/2010, 11:02 AM

## 2010-11-14 NOTE — Progress Notes (Signed)
Subjective: Feels better, tolerating liquid diet. No pyrexia, no new issues.  Objective: Vital signs in last 24 hours: Temp:  [97.4 F (36.3 C)-99 F (37.2 C)] 97.9 F (36.6 C) (11/08 0524) Pulse Rate:  [56-65] 61  (11/08 0524) Resp:  [12-18] 16  (11/08 0524) BP: (97-153)/(59-84) 153/80 mmHg (11/08 0524) SpO2:  [98 %-100 %] 99 % (11/08 0524) Weight change:  Last BM Date: 11/12/10 (after suppository)  Intake/Output from previous day: 11/07 0701 - 11/08 0700 In: 2975.3 [P.O.:30; I.V.:2245.3; Blood:700] Out: 3001 [Urine:3000; Stool:1] Total I/O In: 120 [P.O.:120] Out: 700 [Urine:700]   Physical Exam: General: Comfortable, alert, communicative, fully oriented, not short of breath at rest.  HEENT: Moderate clinical pallor, no jaundice, no conjunctival injection or discharge. Throat: Thrush not evident today, although tongue is furry.  NECK: Supple, JVP not seen, no carotid bruits, no palpable goiter.  CHEST: Clinically clear to auscultation, no wheezes, no crackles.  HEART: Sounds 1 and 2 heard, normal, regular, no murmurs.  ABDOMEN: Full, soft, non-tender. Normal bowel sounds.  LOWER EXTREMITIES: No pitting edema, palpable peripheral pulses.  MUSCULOSKELETAL SYSTEM: Generalized osteoarthritic changes, otherwise, normal.  CENTRAL NERVOUS SYSTEM: No focal neurologic deficit on gross examination.     Lab Results:  [facility] 11/14/10 0654 11/13/10 0620  WBC 2.3* 0.6*  HGB 10.7* 7.1*  HCT 30.6* 20.9*  PLT 70* 63*    Basename 11/13/10 0620 11/12/10 1640  NA 134* 130*  K 3.4* 3.4*  CL 103 95*  CO2 28 28  GLUCOSE 89 135*  BUN 12 17  CREATININE 0.85 0.89  CALCIUM 8.7 9.1   Recent Results (from the past 240 hour(s))  RAPID STREP SCREEN     Status: Normal   Collection Time   11/12/10  4:00 PM      Component Value Range Status Comment   Streptococcus, Group A Screen (Direct) NEGATIVE  NEGATIVE  Final   PATHOLOGIST SMEAR REVIEW     Status: Normal   Collection Time   11/12/10  4:40 PM      Component Value Range Status Comment   Tech Review Reviewed By Havery Moros, M.D.   Final   URINE CULTURE     Status: Normal   Collection Time   11/12/10  7:25 PM      Component Value Range Status Comment   Specimen Description URINE, CLEAN CATCH   Final    Special Requests NONE   Final    Setup Time [id number]   Final    Colony Count NO GROWTH   Final    Culture NO GROWTH   Final    Report Status 11/13/2010 FINAL   Final   CULTURE, BLOOD (ROUTINE X 2)     Status: Normal (Preliminary result)   Collection Time   11/12/10  8:20 PM      Component Value Range Status Comment   Specimen Description BLOOD RIGHT ANTECUBITAL   Final    Special Requests BOTTLES DRAWN AEROBIC ONLY 5CC   Final    Setup Time [id number]   Final    Culture     Final    Value:        BLOOD CULTURE RECEIVED NO GROWTH TO DATE CULTURE WILL BE HELD FOR 5 DAYS BEFORE ISSUING A FINAL NEGATIVE REPORT   Report Status PENDING   Incomplete   CULTURE, BLOOD (ROUTINE X 2)     Status: Normal (Preliminary result)   Collection Time   11/12/10  8:25 PM  Component Value Range Status Comment   Specimen Description BLOOD LEFT HAND   Final    Special Requests BOTTLES DRAWN AEROBIC AND ANAEROBIC 5 CC EACH   Final    Setup Time [id number]   Final    Culture     Final    Value:        BLOOD CULTURE RECEIVED NO GROWTH TO DATE CULTURE WILL BE HELD FOR 5 DAYS BEFORE ISSUING A FINAL NEGATIVE REPORT   Report Status PENDING   Incomplete      Studies/Results: Dg Chest 2 View  11/12/2010  *RADIOLOGY REPORT*  Clinical Data: Chest pain with difficulty swallowing.  History of hypertension.  History of non-Hodgkins lymphoma.  CHEST - 2 VIEW  Comparison: Portable chest 09/02/2010.  PET CT 06/24/2010.  Findings: Right IJ Port-A-Cath tip is at the SVC right atrial junction.  Heart size and mediastinal contours are stable.  There is mild patient rotation to the right.  The lungs are clear and there is no pleural  effusion. Degenerative changes in the upper lumbar spine appear unchanged.  IMPRESSION: No active cardiopulmonary process.  Original Report Authenticated By: Gerrianne Scale, M.D.    Medications: Scheduled Meds:   . acetaminophen      . acetaminophen  650 mg Oral Once  . Boost Plus  237 mL Oral BID BM  . diphenhydrAMINE  25 mg Intravenous Once  . fluconazole (DIFLUCAN) IV  200 mg Intravenous Q24H  . moxifloxacin  400 mg Intravenous Q24H   Continuous Infusions:   . 0.9 % NaCl with KCl 20 mEq / L 1,000 mL (11/14/10 1140)  . DISCONTD: sodium chloride 0.9 % 1,000 mL with potassium chloride 20 mEq infusion 100 mL/hr at 11/13/10 1624   PRN Meds:.acetaminophen, acetaminophen, albuterol, bisacodyl, ondansetron (ZOFRAN) IV, ondansetron, sodium phosphate  Assessment/Plan: Active Problems:  1. Odynophagia: Secondary to candidal esophagitis, against a background of immunosuppression by chemotherapy, malignant disease, as well as steroid treatment. Now on day # 3/10 of Diflucan, and has already noted significant improvement.  2. Oral thrush: See above.  3. Neutropenia/Anemia. Due to chemotherapy and malignant disease. Improved s/p transfusion of 2 units PRBC on 11/13/10. On day # 2/5 Avelox. Per my discussion with Dr Arbutus Ped, Neupogen will not be needed at this time..  4. HTN: Controlled.  5.Lymphoma, large-cell, diffuse Per Oncologist.  6. Renal cell carcinoma, s/p Nephrectomy. Under routine urology care.  Comment: Advance diet as tolerated. Will aim discharge on 11/15/10.      LOS: 2 days   Ronda Rajkumar,CHRISTOPHER 11/14/2010, 11:59 AM

## 2010-11-14 NOTE — Progress Notes (Signed)
Subjective: She is feeling better today. Sore throat improved. She has less fatigue after PRBCs transfusion. Leukocyte count and platelets better.  Objective: Vital signs in last 24 hours: Temp:  [97.4 F (36.3 C)-99 F (37.2 C)] 97.9 F (36.6 C) (11/08 0524) Pulse Rate:  [56-65] 61  (11/08 0524) Resp:  [12-18] 16  (11/08 0524) BP: (97-153)/(59-84) 153/80 mmHg (11/08 0524) SpO2:  [98 %-100 %] 99 % (11/08 0524)  Intake/Output from previous day: 11/07 0701 - 11/08 0700 In: 2975.3 [P.O.:30; I.V.:2245.3; Blood:700] Out: 3001 [Urine:3000; Stool:1]   General appearance: alert, cooperative, appears stated age and no distress Resp: clear to auscultation bilaterally Cardio: regular rate and rhythm, S1, S2 normal, no murmur, click, rub or gallop GI: soft, non-tender; bowel sounds normal; no masses,  no organomegaly Extremities: extremities normal, atraumatic, no cyanosis or edema  Lab Results:   [facility] 11/14/10 0654 11/13/10 0620  WBC 2.3* 0.6*  HGB 10.7* 7.1*  HCT 30.6* 20.9*  PLT 70* 63*   BMET  Basename 11/13/10 0620 11/12/10 1640  NA 134* 130*  K 3.4* 3.4*  CL 103 95*  CO2 28 28  GLUCOSE 89 135*  BUN 12 17  CREATININE 0.85 0.89  CALCIUM 8.7 9.1    Studies/Results: Dg Chest 2 View  11/12/2010  *RADIOLOGY REPORT*  Clinical Data: Chest pain with difficulty swallowing.  History of hypertension.  History of non-Hodgkins lymphoma.  CHEST - 2 VIEW  Comparison: Portable chest 09/02/2010.  PET CT 06/24/2010.  Findings: Right IJ Port-A-Cath tip is at the SVC right atrial junction.  Heart size and mediastinal contours are stable.  There is mild patient rotation to the right.  The lungs are clear and there is no pleural effusion. Degenerative changes in the upper lumbar spine appear unchanged.  IMPRESSION: No active cardiopulmonary process.  Original Report Authenticated By: Gerrianne Scale, M.D.    Medications: I have reviewed the patient's current  medications.  Assessment/Plan:  1) neutropenic fever secondary to her recent treatment with chemotherapy with CHOP/R. The patient received Neulasta after her treatment. Was started on neutropenic precautions and diet. WBC improving. The patient on antibiotic coverage with Avelox.  2) Anemia: received 2 units of packed RBC transfusion yesterday. H/H better. She is also feeling better. 3) Non-Hodgkin's lymphoma diagnosed on bone marrow biopsy in June of 2012, currently on systemic chemotherapy with CHOP Rituxan for non-Hodgkin's lymphoma status post 2 cycle, last cycle was given in 10 days ago.  4) Stage III (T3a N0 M0) renal cell carcinoma diagnosed in June of 2012, Status post right nephrectomy under the care of Dr. Retta Diones on 09/02/2010.  All questions were answered. The patient knows to call the clinic with any problems, questions or concerns. We can certainly see the patient much sooner if necessary.      LOS: 2 days    Melissa Cross K. 11/14/2010

## 2010-11-15 LAB — DIFFERENTIAL
Basophils Absolute: 0 10*3/uL (ref 0.0–0.1)
Basophils Relative: 0 % (ref 0–1)
Eosinophils Absolute: 0 10*3/uL (ref 0.0–0.7)
Lymphocytes Relative: 10 % — ABNORMAL LOW (ref 12–46)
Lymphs Abs: 0.3 10*3/uL — ABNORMAL LOW (ref 0.7–4.0)
Monocytes Absolute: 0.1 10*3/uL (ref 0.1–1.0)
Neutrophils Relative %: 76 % (ref 43–77)

## 2010-11-15 LAB — TYPE AND SCREEN
ABO/RH(D): O POS
Antibody Screen: POSITIVE
Donor AG Type: NEGATIVE
Unit division: 0

## 2010-11-15 LAB — BASIC METABOLIC PANEL
BUN: 9 mg/dL (ref 6–23)
Calcium: 9.3 mg/dL (ref 8.4–10.5)
GFR calc Af Amer: 70 mL/min — ABNORMAL LOW (ref >90)
GFR calc non Af Amer: 60 mL/min — ABNORMAL LOW (ref >90)
Potassium: 3.9 mEq/L (ref 3.5–5.1)

## 2010-11-15 LAB — CBC
Hemoglobin: 10.6 g/dL — ABNORMAL LOW (ref 12.0–15.0)
MCHC: 34.4 g/dL (ref 30.0–36.0)
Platelets: 97 10*3/uL — ABNORMAL LOW (ref 150–400)
RDW: 18.8 % — ABNORMAL HIGH (ref 11.5–15.5)

## 2010-11-15 MED ORDER — FLUCONAZOLE 100 MG PO TABS: 100.0000 mg | ORAL_TABLET | Freq: Every day | ORAL | Status: AC

## 2010-11-15 MED ORDER — HEPARIN SOD (PORK) LOCK FLUSH 100 UNIT/ML IV SOLN
INTRAVENOUS | Status: AC
  Filled 2010-11-15: qty 5

## 2010-11-15 MED ORDER — MOXIFLOXACIN HCL 400 MG PO TABS: 400.0000 mg | ORAL_TABLET | Freq: Every day | ORAL | Status: AC

## 2010-11-15 MED ORDER — BOOST PLUS PO LIQD: 237.0000 mL | Freq: Two times a day (BID) | ORAL | Status: DC

## 2010-11-15 NOTE — Discharge Summary (Signed)
Physician Discharge Summary  Patient ID: Melissa Cross MRN: [id number] DOB/AGE: 1925-01-22 77 y.o.  Admit date: 11/12/2010 Discharge date: 11/15/2010  Primary Care Physician:  Provider Not In System Primary Oncologist: Dr. Velora Heckler. Mohamed. Primary Urologist: Dr. Retta Diones  Discharge Diagnoses:    Patient Active Problem List  Diagnoses  . Lymphoma, large-cell, diffuse  Neutropenia. Candidal Esophagitis. Odynophagia.  Current Discharge Medication List    START taking these medications   Details  Boost Plus (BOOST PLUS) LIQD Take 237 mLs by mouth 2 (two) times daily between meals. Qty: 60 Can, Refills: 0    fluconazole (DIFLUCAN) 100 MG tablet Take 1 tablet (100 mg total) by mouth daily. Qty: 6 tablet, Refills: 0    moxifloxacin (AVELOX) 400 MG tablet Take 1 tablet (400 mg total) by mouth daily. Qty: 2 tablet, Refills: 0      CONTINUE these medications which have NOT CHANGED   Details  allopurinol (ZYLOPRIM) 100 MG tablet Take 100 mg by mouth 2 (two) times daily.     aspirin 81 MG tablet Take 81 mg by mouth daily.     cholecalciferol (VITAMIN D) 400 UNITS TABS Take 400 Units by mouth daily.      cyanocobalamin 1000 MCG tablet Take 100 mcg by mouth daily.      Garlic 1000 MG CAPS Take 1,000 mg by mouth daily.      Ginger, Zingiber officinalis, (GINGER ROOT) 550 MG CAPS Take 550 mg by mouth daily.      levothyroxine (SYNTHROID, LEVOTHROID) 50 MCG tablet Take 50 mcg by mouth daily.      Multiple Vitamins-Minerals (MULTIVITAMIN WITH MINERALS) tablet Take 1 tablet by mouth daily.      nadolol (CORGARD) 20 MG tablet Take 20 mg by mouth daily.      simvastatin (ZOCOR) 5 MG tablet Take 7 mg by mouth at bedtime.        STOP taking these medications     ciprofloxacin (CIPRO) 500 MG tablet          Disposition and Follow-up:  Follow up with Primary Oncologist, Dr. Velora Heckler. Mohamed, and Aeronautical engineer, Dr. Retta Diones.  Consults:  hematology/oncology  Dr.  Velora Heckler. Mohamed.  Significant Diagnostic Studies:  Dg Chest 2 View  11/12/2010  *RADIOLOGY REPORT*  Clinical Data: Chest pain with difficulty swallowing.  History of hypertension.  History of non-Hodgkins lymphoma.  CHEST - 2 VIEW  Comparison: Portable chest 09/02/2010.  PET CT 06/24/2010.  Findings: Right IJ Port-A-Cath tip is at the SVC right atrial junction.  Heart size and mediastinal contours are stable.  There is mild patient rotation to the right.  The lungs are clear and there is no pleural effusion. Degenerative changes in the upper lumbar spine appear unchanged.  IMPRESSION: No active cardiopulmonary process.  Original Report Authenticated By: Gerrianne Scale, M.D.    Brief H and P: For complete details, refer to admission H and P, however,  in brief, this is an 77 year old female with history of Non-Hodgkin's lymphoma diagnosed on bone marrow biopsy in June of 2012, currently on systemic chemotherapy with CHOP Rituxan for non-Hodgkin's lymphoma status post 2 cycles, last cycle was given in 9 days prior to this presentation, Stage III (T3a N0 M0) renal cell carcinoma diagnosed in June of 2012, Status post right nephrectomy under the care of Dr. Retta Diones on 09/02/2010, presenting  with 3 days history of fatigue, weakness, low grade fever, sore throat and difficulty swallowing. She was admitted for evaluation, investigation and  management.   Physical Exam: On 11/15/10. General: Comfortable, alert, communicative, fully oriented, not short of breath at rest, tolerating soft mechanical diet.  HEENT: Mild clinical pallor, no jaundice, no conjunctival injection or discharge. Throat: Thrush not evident, and tongue is less furry.  NECK: Supple, JVP not seen, no carotid bruits, no palpable goiter.  CHEST: Clinically clear to auscultation, no wheezes, no crackles.  HEART: Sounds 1 and 2 heard, normal, regular, no murmurs.  ABDOMEN: Full, soft, non-tender. Normal bowel sounds.  LOWER EXTREMITIES:  No pitting edema, palpable peripheral pulses.  MUSCULOSKELETAL SYSTEM: Generalized osteoarthritic changes, otherwise, normal.  CENTRAL NERVOUS SYSTEM: No focal neurologic deficit on gross examination.   Hospital Course:  Active Problems:  1. Odynophagia: Secondary to candidal esophagitis, against a background of immunosuppression by chemotherapy, malignant disease, as well as steroid treatment. As of 11/15/2010, patient was on day # 4 of a planned 10 day course of Diflucan, and is already tolerating soft mechanical diet.  2. Oral thrush: See above. Resolving clinically. 3. Neutropenia/Anemia. Due to chemotherapy and malignant disease. Improved after transfusion of 2 units PRBC on 11/13/10. Patient was placed on Avelox, on recommedation of Oncologist, because of a low grade pyrexia of 100. She had no further episodes of pyrexia during this hospitalization, and no positive cultures. As of 11/15/10, she was on day # 2 of a planned 5 day course of Avelox, per my discussion with Dr Arbutus Ped. Neutropenia has resolved. 4. HTN: Remained controlled during this hopitalization.  5.Lymphoma, large-cell, diffuse: Will continue follow up with primary Oncologist, on discharge. 6. Renal cell carcinoma, s/p Nephrectomy. Under routine urology care.    Comment: Patient was considered clinically stable for discharge on 11/15/10.       Time spent on Discharge: 45 mins  Signed: Saylor Murry,CHRISTOPHER 11/15/2010, 12:04 PM

## 2010-11-18 ENCOUNTER — Other Ambulatory Visit (HOSPITAL_BASED_OUTPATIENT_CLINIC_OR_DEPARTMENT_OTHER): Payer: Medicare Other | Admitting: Lab

## 2010-11-18 ENCOUNTER — Other Ambulatory Visit: Payer: Self-pay | Admitting: Internal Medicine

## 2010-11-18 DIAGNOSIS — C649 Malignant neoplasm of unspecified kidney, except renal pelvis: Secondary | ICD-10-CM | POA: Diagnosis not present

## 2010-11-18 DIAGNOSIS — D472 Monoclonal gammopathy: Secondary | ICD-10-CM

## 2010-11-18 DIAGNOSIS — R5381 Other malaise: Secondary | ICD-10-CM | POA: Diagnosis not present

## 2010-11-18 DIAGNOSIS — D539 Nutritional anemia, unspecified: Secondary | ICD-10-CM

## 2010-11-18 DIAGNOSIS — C8589 Other specified types of non-Hodgkin lymphoma, extranodal and solid organ sites: Secondary | ICD-10-CM

## 2010-11-18 DIAGNOSIS — D649 Anemia, unspecified: Secondary | ICD-10-CM

## 2010-11-18 DIAGNOSIS — R269 Unspecified abnormalities of gait and mobility: Secondary | ICD-10-CM | POA: Diagnosis not present

## 2010-11-18 DIAGNOSIS — F09 Unspecified mental disorder due to known physiological condition: Secondary | ICD-10-CM | POA: Diagnosis not present

## 2010-11-18 LAB — COMPREHENSIVE METABOLIC PANEL
ALT: 10 U/L (ref 0–35)
Alkaline Phosphatase: 93 U/L (ref 39–117)
Creatinine, Ser: 1.04 mg/dL (ref 0.50–1.10)
Glucose, Bld: 100 mg/dL — ABNORMAL HIGH (ref 70–99)
Sodium: 137 mEq/L (ref 135–145)
Total Bilirubin: 0.5 mg/dL (ref 0.3–1.2)
Total Protein: 6.1 g/dL (ref 6.0–8.3)

## 2010-11-18 LAB — CBC WITH DIFFERENTIAL/PLATELET
BASO%: 1.1 % (ref 0.0–2.0)
HCT: 36.6 % (ref 34.8–46.6)
LYMPH%: 14 % (ref 14.0–49.7)
MCH: 32.8 pg (ref 25.1–34.0)
MCHC: 34 g/dL (ref 31.5–36.0)
MCV: 96.5 fL (ref 79.5–101.0)
MONO%: 1.1 % (ref 0.0–14.0)
NEUT%: 83.8 % — ABNORMAL HIGH (ref 38.4–76.8)
Platelets: 231 10*3/uL (ref 145–400)
RBC: 3.79 10*6/uL (ref 3.70–5.45)

## 2010-11-19 ENCOUNTER — Telehealth: Payer: Self-pay | Admitting: *Deleted

## 2010-11-19 LAB — CULTURE, BLOOD (ROUTINE X 2): Culture  Setup Time: 201211070146

## 2010-11-19 NOTE — Telephone Encounter (Signed)
Pt called and stated that her hand is swollen (specifically her wrist and thumb) and it is red and tender.  She had stopped allopurinol while in the hospital and restarted it this past Friday.  Per Dr Donnald Garre, keep taking allopurinol and see if it resolves on its own over the next few days.  Pt verbalized understanding  SLJ

## 2010-11-21 ENCOUNTER — Telehealth: Payer: Self-pay | Admitting: *Deleted

## 2010-11-21 DIAGNOSIS — C649 Malignant neoplasm of unspecified kidney, except renal pelvis: Secondary | ICD-10-CM | POA: Diagnosis not present

## 2010-11-21 DIAGNOSIS — R5383 Other fatigue: Secondary | ICD-10-CM | POA: Diagnosis not present

## 2010-11-21 DIAGNOSIS — F09 Unspecified mental disorder due to known physiological condition: Secondary | ICD-10-CM | POA: Diagnosis not present

## 2010-11-21 DIAGNOSIS — C8589 Other specified types of non-Hodgkin lymphoma, extranodal and solid organ sites: Secondary | ICD-10-CM | POA: Diagnosis not present

## 2010-11-21 DIAGNOSIS — C8590 Non-Hodgkin lymphoma, unspecified, unspecified site: Secondary | ICD-10-CM

## 2010-11-21 DIAGNOSIS — R269 Unspecified abnormalities of gait and mobility: Secondary | ICD-10-CM | POA: Diagnosis not present

## 2010-11-21 MED ORDER — COLCHICINE 0.6 MG PO TABS: 0.6000 mg | ORAL_TABLET | ORAL | Status: DC

## 2010-11-21 NOTE — Telephone Encounter (Signed)
Pt called with continued pain and swelling in her hand that she states is getting worse.  Per Dr Donnald Garre, okay to call in colchicine.  Pt aware and understands instructions.

## 2010-11-23 ENCOUNTER — Other Ambulatory Visit: Payer: Self-pay | Admitting: Internal Medicine

## 2010-11-23 ENCOUNTER — Other Ambulatory Visit: Payer: Self-pay | Admitting: Physician Assistant

## 2010-11-23 DIAGNOSIS — C8330 Diffuse large B-cell lymphoma, unspecified site: Secondary | ICD-10-CM

## 2010-11-23 DIAGNOSIS — C8590 Non-Hodgkin lymphoma, unspecified, unspecified site: Secondary | ICD-10-CM | POA: Insufficient documentation

## 2010-11-25 ENCOUNTER — Ambulatory Visit (HOSPITAL_BASED_OUTPATIENT_CLINIC_OR_DEPARTMENT_OTHER): Payer: Medicare Other

## 2010-11-25 ENCOUNTER — Other Ambulatory Visit (HOSPITAL_BASED_OUTPATIENT_CLINIC_OR_DEPARTMENT_OTHER): Payer: Medicare Other | Admitting: Lab

## 2010-11-25 ENCOUNTER — Ambulatory Visit (HOSPITAL_BASED_OUTPATIENT_CLINIC_OR_DEPARTMENT_OTHER): Payer: Medicare Other | Admitting: Physician Assistant

## 2010-11-25 ENCOUNTER — Other Ambulatory Visit: Payer: Self-pay | Admitting: Internal Medicine

## 2010-11-25 VITALS — BP 120/59 | HR 76 | Temp 97.9°F | Ht 63.0 in | Wt 115.0 lb

## 2010-11-25 DIAGNOSIS — C649 Malignant neoplasm of unspecified kidney, except renal pelvis: Secondary | ICD-10-CM

## 2010-11-25 DIAGNOSIS — B370 Candidal stomatitis: Secondary | ICD-10-CM

## 2010-11-25 DIAGNOSIS — C8589 Other specified types of non-Hodgkin lymphoma, extranodal and solid organ sites: Secondary | ICD-10-CM

## 2010-11-25 DIAGNOSIS — Z5111 Encounter for antineoplastic chemotherapy: Secondary | ICD-10-CM

## 2010-11-25 DIAGNOSIS — C8590 Non-Hodgkin lymphoma, unspecified, unspecified site: Secondary | ICD-10-CM

## 2010-11-25 LAB — COMPREHENSIVE METABOLIC PANEL
CO2: 27 mEq/L (ref 19–32)
Creatinine, Ser: 0.84 mg/dL (ref 0.50–1.10)
Glucose, Bld: 95 mg/dL (ref 70–99)
Total Bilirubin: 0.3 mg/dL (ref 0.3–1.2)

## 2010-11-25 LAB — CBC WITH DIFFERENTIAL/PLATELET
Eosinophils Absolute: 0.1 10*3/uL (ref 0.0–0.5)
HCT: 35.7 % (ref 34.8–46.6)
LYMPH%: 17.6 % (ref 14.0–49.7)
MCV: 95.5 fL (ref 79.5–101.0)
MONO#: 0.4 10*3/uL (ref 0.1–0.9)
MONO%: 9.6 % (ref 0.0–14.0)
NEUT#: 2.5 10*3/uL (ref 1.5–6.5)
NEUT%: 69.8 % (ref 38.4–76.8)
Platelets: 348 10*3/uL (ref 145–400)
RBC: 3.74 10*6/uL (ref 3.70–5.45)
WBC: 3.6 10*3/uL — ABNORMAL LOW (ref 3.9–10.3)
nRBC: 0 % (ref 0–0)

## 2010-11-25 LAB — LACTATE DEHYDROGENASE: LDH: 154 U/L (ref 94–250)

## 2010-11-25 LAB — URIC ACID: Uric Acid, Serum: 2.7 mg/dL (ref 2.4–7.0)

## 2010-11-25 MED ORDER — SODIUM CHLORIDE 0.9 % IV SOLN
INTRAVENOUS | Status: DC
  Administered 2010-11-25: 11:00:00 via INTRAVENOUS

## 2010-11-25 MED ORDER — NYSTATIN 100000 UNIT/ML MT SUSP: OROMUCOSAL | Status: AC

## 2010-11-25 MED ORDER — SODIUM CHLORIDE 0.9 % IV SOLN
375.0000 mg/m2 | Freq: Once | INTRAVENOUS | Status: AC
  Administered 2010-11-25: 600 mg via INTRAVENOUS
  Filled 2010-11-25: qty 60

## 2010-11-25 MED ORDER — SODIUM CHLORIDE 0.9 % IV SOLN
750.0000 mg/m2 | Freq: Once | INTRAVENOUS | Status: AC
  Administered 2010-11-25: 1140 mg via INTRAVENOUS
  Filled 2010-11-25: qty 57

## 2010-11-25 MED ORDER — SODIUM CHLORIDE 0.9 % IV SOLN: 375.0000 mg/m2 | Freq: Once | INTRAVENOUS | Status: DC

## 2010-11-25 MED ORDER — HEPARIN SOD (PORK) LOCK FLUSH 100 UNIT/ML IV SOLN
500.0000 [IU] | Freq: Once | INTRAVENOUS | Status: AC | PRN
  Administered 2010-11-25: 500 [IU]
  Filled 2010-11-25: qty 5

## 2010-11-25 MED ORDER — ACETAMINOPHEN 325 MG PO TABS
650.0000 mg | ORAL_TABLET | Freq: Once | ORAL | Status: AC
  Administered 2010-11-25: 650 mg via ORAL

## 2010-11-25 MED ORDER — ONDANSETRON 16 MG/50ML IVPB (CHCC)
16.0000 mg | Freq: Once | INTRAVENOUS | Status: AC
  Administered 2010-11-25: 16 mg via INTRAVENOUS

## 2010-11-25 MED ORDER — DEXAMETHASONE SODIUM PHOSPHATE 4 MG/ML IJ SOLN
20.0000 mg | Freq: Once | INTRAMUSCULAR | Status: AC
  Administered 2010-11-25: 20 mg via INTRAVENOUS

## 2010-11-25 MED ORDER — SODIUM CHLORIDE 0.9 % IJ SOLN
10.0000 mL | INTRAMUSCULAR | Status: DC | PRN
  Administered 2010-11-25: 10 mL
  Filled 2010-11-25: qty 10

## 2010-11-25 MED ORDER — DIPHENHYDRAMINE HCL 25 MG PO CAPS
50.0000 mg | ORAL_CAPSULE | Freq: Once | ORAL | Status: AC
  Administered 2010-11-25: 50 mg via ORAL

## 2010-11-25 MED ORDER — SODIUM CHLORIDE 0.9 % IV SOLN
Freq: Once | INTRAVENOUS | Status: AC
  Administered 2010-11-25: 10:00:00 via INTRAVENOUS

## 2010-11-25 MED ORDER — VINCRISTINE SULFATE CHEMO INJECTION 1 MG/ML
2.0000 mg | Freq: Once | INTRAVENOUS | Status: AC
  Administered 2010-11-25: 2 mg via INTRAVENOUS
  Filled 2010-11-25: qty 2

## 2010-11-25 MED ORDER — DOXORUBICIN HCL CHEMO IV INJECTION 2 MG/ML
50.0000 mg/m2 | Freq: Once | INTRAVENOUS | Status: AC
  Administered 2010-11-25: 76 mg via INTRAVENOUS
  Filled 2010-11-25: qty 38

## 2010-11-25 NOTE — Patient Instructions (Signed)
Call MD for problems 

## 2010-11-25 NOTE — Progress Notes (Signed)
No images are attached to the encounter. No scans are attached to the encounter. No scans are attached to the encounter. [facility] OFFICE PROGRESS NOTE  Melissa Cross M.D. Melissa Cross M.D.  DIAGNOSIS: #1 non-Hodgkin's lymphoma diagnosed on bone marrow biopsy in June of 2012 #2 stage III (T3a N0M0) renal cell carcinoma diagnosed in June of 2012  PRIOR THERAPY: Status post right nephrectomy under the care of Dr. Retta Cross on 09/02/2010  CURRENT THERAPY: Systemic chemotherapy with CHOP/Rituxan given every 3 weeks, status post 2 cycles  INTERVAL HISTORY: Melissa Cross 77 y.o. female returns for a scheduled regular three-week visit for followup of her non-Hodgkin's lymphoma. She had a recent hospitalization secondary to significant dysphagia. According to her son Melissa Cross who is also a Advice worker here in the [facility] system she had a severe case of thrush also involving the esophagus. There was as a result she was unable to eat or drink was dehydrated malnourished and unable to take her medications. As a result of being unable to take her medications she developed gallop in her right wrist. This is slowly resolving. She states that she did tolerate the 50 mg dose of prednisone without difficulty specifically no severe headaches. She states her daughter was listening over the weekend. The daughter was exposed someone who had an upper respiratory infection several days prior to her visit. While the daughter was visiting she experienced sore throat without fever. There is no direct contact with the patient. That she wanted [place] to be aware. Ms. Melissa Cross voices no specific complaints today.  MEDICAL HISTORY: Past Medical History  Diagnosis Date  . Renal cell carcinoma   . Hypertension   . Hypercholesteremia   . Non Hodgkin's lymphoma   . Glaucoma   . Arthritis   . Anemia   . Stroke     hx of TIA  . Blood transfusion   . Hypothyroidism   . Hypothyroid     on synthroid   .  Cancer     kidney cancer, NHL   . Cancer     last chemo 11/04/10     ALLERGIES:  is allergic to dilaudid.  MEDICATIONS:  Current Outpatient Prescriptions  Medication Sig Dispense Refill  . allopurinol (ZYLOPRIM) 100 MG tablet Take 100 mg by mouth 2 (two) times daily.       [name] aspirin 81 MG tablet Take 81 mg by mouth daily.       . Boost Plus (BOOST PLUS) LIQD Take 237 mLs by mouth 2 (two) times daily between meals.  60 Can  0  . cholecalciferol (VITAMIN D) 400 UNITS TABS Take 400 Units by mouth daily.        . colchicine 0.6 MG tablet Take 1 tablet (0.6 mg total) by mouth as directed.  15 tablet  0  . cyanocobalamin 1000 MCG tablet Take 100 mcg by mouth daily.        . Garlic 1000 MG CAPS Take 1,000 mg by mouth daily.        . Ginger, Zingiber officinalis, (GINGER ROOT) 550 MG CAPS Take 550 mg by mouth daily.        [name] levothyroxine (SYNTHROID, LEVOTHROID) 50 MCG tablet Take 50 mcg by mouth daily.        [name] lidocaine-prilocaine (EMLA) cream       . moxifloxacin (AVELOX) 400 MG tablet Take 1 tablet (400 mg total) by mouth daily.  2 tablet  0  . Multiple Vitamins-Minerals (MULTIVITAMIN WITH MINERALS)  tablet Take 1 tablet by mouth daily.        . nadolol (CORGARD) 20 MG tablet Take 20 mg by mouth daily.        . predniSONE (DELTASONE) 50 MG tablet       . prochlorperazine (COMPAZINE) 10 MG tablet       . simvastatin (ZOCOR) 5 MG tablet Take 7 mg by mouth at bedtime.        . ciprofloxacin (CIPRO) 500 MG tablet       . fluconazole (DIFLUCAN) 100 MG tablet       . nystatin (MYCOSTATIN) 100000 UNIT/ML suspension 5 ml swish and spit four times a day  180 mL  0   No current facility-administered medications for this visit.   Facility-Administered Medications Ordered in Other Visits  Medication Dose Route Frequency Provider Last Rate Last Dose  . 0.9 %  sodium chloride infusion   Intravenous Once Melissa K. Mohamed, MD 20 mL/hr at 11/25/10 0940    . 0.9 %  sodium chloride infusion    Intravenous Continuous Melissa K. Mohamed, MD      . acetaminophen (TYLENOL) tablet 650 mg  650 mg Oral Once Melissa K. Mohamed, MD   650 mg at 11/25/10 1005  . cyclophosphamide (CYTOXAN) 1,140 mg in sodium chloride 0.9 % 250 mL chemo infusion  750 mg/m2 (Treatment Plan Actual) Intravenous Once Melissa K. Mohamed, MD 614 mL/hr at 11/25/10 1126 1,140 mg at 11/25/10 1126  . dexamethasone (DECADRON) injection 20 mg  20 mg Intravenous Once Melissa K. Mohamed, MD   20 mg at 11/25/10 1002  . diphenhydrAMINE (BENADRYL) capsule 50 mg  50 mg Oral Once Melissa K. Mohamed, MD   50 mg at 11/25/10 1005  . DOXOrubicin (ADRIAMYCIN) chemo injection 76 mg  50 mg/m2 (Treatment Plan Actual) Intravenous Once Melissa K. Mohamed, MD   76 mg at 11/25/10 1103  . heparin lock flush 100 unit/mL  500 Units Intracatheter Once PRN Melissa K. Mohamed, MD      . ondansetron (ZOFRAN) IVPB 16 mg  16 mg Intravenous Once Melissa K. Mohamed, MD   16 mg at 11/25/10 1002  . riTUXimab (RITUXAN) 600 mg in sodium chloride 0.9 % 190 mL chemo infusion  375 mg/m2 Intravenous Once Melissa K. Mohamed, MD      . sodium chloride 0.9 % injection 10 mL  10 mL Intracatheter PRN Melissa K. Mohamed, MD      . vinCRIStine (ONCOVIN) 2 mg in sodium chloride 0.9 % 18 mL chemo injection  2 mg Intravenous Once Melissa K. Mohamed, MD   2 mg at 11/25/10 1117  . DISCONTD: riTUXimab (RITUXAN) 600 mg in sodium chloride 0.9 % 250 mL chemo infusion  375 mg/m2 (Treatment Plan Actual) Intravenous Once Melissa K. Arbutus Ped, MD        SURGICAL HISTORY:  Past Surgical History  Procedure Date  . Nephrectomy     Right Side  . Tonsillectomy   . Portacath placement     right subclavian    REVIEW OF SYSTEMS:  A comprehensive review of systems was negative except for: Musculoskeletal: positive for Resolving gallop involving the right wrist and MCP joint   PHYSICAL EXAMINATION: General appearance: alert, cooperative, appears stated age and no distress Head:  Normocephalic, without obvious abnormality, atraumatic Neck: no adenopathy, no carotid bruit, no JVD, supple, symmetrical, trachea midline and thyroid not enlarged, symmetric, no tenderness/mass/nodules Lymph nodes: Cervical, supraclavicular, and axillary nodes normal. Resp: clear to auscultation bilaterally Cardio: regular  rate and rhythm, S1, S2 normal, no murmur, click, rub or gallop GI: soft, non-tender; bowel sounds normal; no masses,  no organomegaly Extremities: No point tenderness pitting edema or palpable cords however there remains some mild tenderness erythema, and warmth to the right wrist and base of the right some.  ECOG PERFORMANCE STATUS: 1 - Symptomatic but completely ambulatory  There were no vitals taken for this visit.  LABORATORY DATA: Lab Results  Component Value Date   WBC 3.6* 11/25/2010   HGB 12.1 11/25/2010   HCT 35.7 11/25/2010   MCV 95.5 11/25/2010   PLT 348 11/25/2010      Chemistry      Component Value Date/Time   NA 137 11/18/2010 0813   NA 137 11/18/2010 0813   K 4.5 11/18/2010 0813   K 4.5 11/18/2010 0813   CL 103 11/18/2010 0813   CL 103 11/18/2010 0813   CO2 25 11/18/2010 0813   CO2 25 11/18/2010 0813   BUN 12 11/18/2010 0813   BUN 12 11/18/2010 0813   CREATININE 1.04 11/18/2010 0813   CREATININE 1.04 11/18/2010 0813      Component Value Date/Time   CALCIUM 9.5 11/18/2010 0813   CALCIUM 9.5 11/18/2010 0813   ALKPHOS 93 11/18/2010 0813   ALKPHOS 93 11/18/2010 0813   AST 14 11/18/2010 0813   AST 14 11/18/2010 0813   ALT 10 11/18/2010 0813   ALT 10 11/18/2010 0813   BILITOT 0.5 11/18/2010 0813   BILITOT 0.5 11/18/2010 0813       RADIOGRAPHIC STUDIES:  No results found.  ASSESSMENT/PLAN: The patient is a pleasant 77 year old white female with non-Hodgkin's lymphoma status post 2 cycles of CHOP Rituxan. She also has a history of stage III renal cell carcinoma status post right nephrectomy. Patient was discussed with Dr. Arbutus Cross.  She will proceed with her third cycle of CHOP Rituxan as scheduled. She is given a prescription for nystatin suspension to start when she has the first signs of oral candidiasis. This was sent to the E. prescription to her pharmacy of record. She will be set up for a CT of the chest abdomen and pelvis without contrast in deference to her single kidney. She'll follow with Dr. Gwenyth Bouillon in 3 weeks to discuss the results of the restaging scans and proceed with her fourth cycle of systemic chemotherapy with CHOP Rituxan with Neulasta support. She will continue with weekly labs consisting of a CBC differential and CMETt. She returns in 3 weeks she will have a CBC differential CMET LDH and uric acid drawn.    Melissa Slipper, PA-C     All questions were answered. The patient knows to call the clinic with any problems, questions or concerns. We can certainly see the patient much sooner if necessary.

## 2010-11-26 ENCOUNTER — Ambulatory Visit (HOSPITAL_BASED_OUTPATIENT_CLINIC_OR_DEPARTMENT_OTHER): Payer: Medicare Other

## 2010-11-26 VITALS — BP 133/72 | HR 67 | Temp 98.4°F

## 2010-11-26 DIAGNOSIS — R269 Unspecified abnormalities of gait and mobility: Secondary | ICD-10-CM | POA: Diagnosis not present

## 2010-11-26 DIAGNOSIS — F09 Unspecified mental disorder due to known physiological condition: Secondary | ICD-10-CM | POA: Diagnosis not present

## 2010-11-26 DIAGNOSIS — R5383 Other fatigue: Secondary | ICD-10-CM | POA: Diagnosis not present

## 2010-11-26 DIAGNOSIS — C8590 Non-Hodgkin lymphoma, unspecified, unspecified site: Secondary | ICD-10-CM

## 2010-11-26 DIAGNOSIS — C8589 Other specified types of non-Hodgkin lymphoma, extranodal and solid organ sites: Secondary | ICD-10-CM | POA: Diagnosis not present

## 2010-11-26 DIAGNOSIS — C649 Malignant neoplasm of unspecified kidney, except renal pelvis: Secondary | ICD-10-CM | POA: Diagnosis not present

## 2010-11-26 DIAGNOSIS — D649 Anemia, unspecified: Secondary | ICD-10-CM

## 2010-11-26 MED ORDER — PEGFILGRASTIM INJECTION 6 MG/0.6ML
6.0000 mg | Freq: Once | SUBCUTANEOUS | Status: AC
  Administered 2010-11-26: 6 mg via SUBCUTANEOUS
  Filled 2010-11-26: qty 0.6

## 2010-11-27 ENCOUNTER — Telehealth: Payer: Self-pay | Admitting: Internal Medicine

## 2010-11-27 NOTE — Telephone Encounter (Signed)
Per AJ/desk nurse due to limited availability ok to schedule pt for f/u w/AJ 12/11 (day after chemo) - MM will step in and discuss scans. S/w pt today re appt for 12/11 @ 10;45 am. Pt aware inj for 8 am cx'd she will get inj when she comes in for f/u appt. Pt was given appt schedule for the rest of nov/dec and ct for 12/7 prior to leaving 11/19.

## 2010-11-29 DIAGNOSIS — R269 Unspecified abnormalities of gait and mobility: Secondary | ICD-10-CM | POA: Diagnosis not present

## 2010-11-29 DIAGNOSIS — C8589 Other specified types of non-Hodgkin lymphoma, extranodal and solid organ sites: Secondary | ICD-10-CM | POA: Diagnosis not present

## 2010-11-29 DIAGNOSIS — C649 Malignant neoplasm of unspecified kidney, except renal pelvis: Secondary | ICD-10-CM | POA: Diagnosis not present

## 2010-11-29 DIAGNOSIS — F09 Unspecified mental disorder due to known physiological condition: Secondary | ICD-10-CM | POA: Diagnosis not present

## 2010-11-29 DIAGNOSIS — R5381 Other malaise: Secondary | ICD-10-CM | POA: Diagnosis not present

## 2010-12-02 ENCOUNTER — Other Ambulatory Visit (HOSPITAL_BASED_OUTPATIENT_CLINIC_OR_DEPARTMENT_OTHER): Payer: Medicare Other | Admitting: Lab

## 2010-12-02 ENCOUNTER — Telehealth: Payer: Self-pay | Admitting: Internal Medicine

## 2010-12-02 DIAGNOSIS — C8589 Other specified types of non-Hodgkin lymphoma, extranodal and solid organ sites: Secondary | ICD-10-CM

## 2010-12-02 DIAGNOSIS — R5383 Other fatigue: Secondary | ICD-10-CM | POA: Diagnosis not present

## 2010-12-02 DIAGNOSIS — R269 Unspecified abnormalities of gait and mobility: Secondary | ICD-10-CM | POA: Diagnosis not present

## 2010-12-02 DIAGNOSIS — F09 Unspecified mental disorder due to known physiological condition: Secondary | ICD-10-CM | POA: Diagnosis not present

## 2010-12-02 DIAGNOSIS — C8590 Non-Hodgkin lymphoma, unspecified, unspecified site: Secondary | ICD-10-CM

## 2010-12-02 DIAGNOSIS — C649 Malignant neoplasm of unspecified kidney, except renal pelvis: Secondary | ICD-10-CM | POA: Diagnosis not present

## 2010-12-02 DIAGNOSIS — C8330 Diffuse large B-cell lymphoma, unspecified site: Secondary | ICD-10-CM

## 2010-12-02 LAB — CBC WITH DIFFERENTIAL/PLATELET
Basophils Absolute: 0 10*3/uL (ref 0.0–0.1)
Eosinophils Absolute: 0 10*3/uL (ref 0.0–0.5)
HGB: 11.5 g/dL — ABNORMAL LOW (ref 11.6–15.9)
MCV: 96.6 fL (ref 79.5–101.0)
MONO#: 0 10*3/uL — ABNORMAL LOW (ref 0.1–0.9)
NEUT#: 0 10*3/uL — CL (ref 1.5–6.5)
Platelets: 136 10*3/uL — ABNORMAL LOW (ref 145–400)
RBC: 3.41 10*6/uL — ABNORMAL LOW (ref 3.70–5.45)
RDW: 20.6 % — ABNORMAL HIGH (ref 11.2–14.5)
WBC: 0.4 10*3/uL — CL (ref 3.9–10.3)

## 2010-12-02 LAB — COMPREHENSIVE METABOLIC PANEL
Albumin: 3.8 g/dL (ref 3.5–5.2)
BUN: 21 mg/dL (ref 6–23)
CO2: 33 mEq/L — ABNORMAL HIGH (ref 19–32)
Calcium: 9.6 mg/dL (ref 8.4–10.5)
Glucose, Bld: 95 mg/dL (ref 70–99)
Potassium: 3.7 mEq/L (ref 3.5–5.3)
Sodium: 139 mEq/L (ref 135–145)
Total Protein: 5.6 g/dL — ABNORMAL LOW (ref 6.0–8.3)

## 2010-12-02 NOTE — Telephone Encounter (Signed)
Reviewed neutrapenic precautions with pt and when to call. Denies fever and has thrush but also has diflucan for same. Dr. Donnald Garre notified

## 2010-12-02 NOTE — Telephone Encounter (Addendum)
Called to pt to pick up rx Called in Cipro 500 mg po bid x 7 days

## 2010-12-03 DIAGNOSIS — F09 Unspecified mental disorder due to known physiological condition: Secondary | ICD-10-CM | POA: Diagnosis not present

## 2010-12-03 DIAGNOSIS — R269 Unspecified abnormalities of gait and mobility: Secondary | ICD-10-CM | POA: Diagnosis not present

## 2010-12-03 DIAGNOSIS — R5381 Other malaise: Secondary | ICD-10-CM | POA: Diagnosis not present

## 2010-12-03 DIAGNOSIS — C649 Malignant neoplasm of unspecified kidney, except renal pelvis: Secondary | ICD-10-CM | POA: Diagnosis not present

## 2010-12-03 DIAGNOSIS — C8589 Other specified types of non-Hodgkin lymphoma, extranodal and solid organ sites: Secondary | ICD-10-CM | POA: Diagnosis not present

## 2010-12-05 DIAGNOSIS — R5383 Other fatigue: Secondary | ICD-10-CM | POA: Diagnosis not present

## 2010-12-05 DIAGNOSIS — R269 Unspecified abnormalities of gait and mobility: Secondary | ICD-10-CM | POA: Diagnosis not present

## 2010-12-05 DIAGNOSIS — F09 Unspecified mental disorder due to known physiological condition: Secondary | ICD-10-CM | POA: Diagnosis not present

## 2010-12-05 DIAGNOSIS — C649 Malignant neoplasm of unspecified kidney, except renal pelvis: Secondary | ICD-10-CM | POA: Diagnosis not present

## 2010-12-05 DIAGNOSIS — C8589 Other specified types of non-Hodgkin lymphoma, extranodal and solid organ sites: Secondary | ICD-10-CM | POA: Diagnosis not present

## 2010-12-06 DIAGNOSIS — F09 Unspecified mental disorder due to known physiological condition: Secondary | ICD-10-CM | POA: Diagnosis not present

## 2010-12-06 DIAGNOSIS — C649 Malignant neoplasm of unspecified kidney, except renal pelvis: Secondary | ICD-10-CM | POA: Diagnosis not present

## 2010-12-06 DIAGNOSIS — C8589 Other specified types of non-Hodgkin lymphoma, extranodal and solid organ sites: Secondary | ICD-10-CM | POA: Diagnosis not present

## 2010-12-06 DIAGNOSIS — R5383 Other fatigue: Secondary | ICD-10-CM | POA: Diagnosis not present

## 2010-12-06 DIAGNOSIS — R269 Unspecified abnormalities of gait and mobility: Secondary | ICD-10-CM | POA: Diagnosis not present

## 2010-12-09 ENCOUNTER — Other Ambulatory Visit (HOSPITAL_BASED_OUTPATIENT_CLINIC_OR_DEPARTMENT_OTHER): Payer: Medicare Other | Admitting: Lab

## 2010-12-09 DIAGNOSIS — C8589 Other specified types of non-Hodgkin lymphoma, extranodal and solid organ sites: Secondary | ICD-10-CM

## 2010-12-09 DIAGNOSIS — C8590 Non-Hodgkin lymphoma, unspecified, unspecified site: Secondary | ICD-10-CM

## 2010-12-09 DIAGNOSIS — C8330 Diffuse large B-cell lymphoma, unspecified site: Secondary | ICD-10-CM

## 2010-12-09 LAB — CBC WITH DIFFERENTIAL/PLATELET
Basophils Absolute: 0 10*3/uL (ref 0.0–0.1)
Eosinophils Absolute: 0 10*3/uL (ref 0.0–0.5)
HGB: 11.1 g/dL — ABNORMAL LOW (ref 11.6–15.9)
MONO#: 0.1 10*3/uL (ref 0.1–0.9)
MONO%: 1.6 % (ref 0.0–14.0)
NEUT#: 6.3 10*3/uL (ref 1.5–6.5)
RBC: 3.25 10*6/uL — ABNORMAL LOW (ref 3.70–5.45)
RDW: 20.8 % — ABNORMAL HIGH (ref 11.2–14.5)
WBC: 7.1 10*3/uL (ref 3.9–10.3)
lymph#: 0.6 10*3/uL — ABNORMAL LOW (ref 0.9–3.3)

## 2010-12-09 LAB — COMPREHENSIVE METABOLIC PANEL
Albumin: 3.8 g/dL (ref 3.5–5.2)
Alkaline Phosphatase: 109 U/L (ref 39–117)
Calcium: 9.4 mg/dL (ref 8.4–10.5)
Chloride: 101 mEq/L (ref 96–112)
Glucose, Bld: 109 mg/dL — ABNORMAL HIGH (ref 70–99)
Potassium: 4.2 mEq/L (ref 3.5–5.3)
Sodium: 138 mEq/L (ref 135–145)
Total Protein: 5.9 g/dL — ABNORMAL LOW (ref 6.0–8.3)

## 2010-12-11 DIAGNOSIS — F09 Unspecified mental disorder due to known physiological condition: Secondary | ICD-10-CM | POA: Diagnosis not present

## 2010-12-11 DIAGNOSIS — R269 Unspecified abnormalities of gait and mobility: Secondary | ICD-10-CM | POA: Diagnosis not present

## 2010-12-11 DIAGNOSIS — C649 Malignant neoplasm of unspecified kidney, except renal pelvis: Secondary | ICD-10-CM | POA: Diagnosis not present

## 2010-12-11 DIAGNOSIS — R5381 Other malaise: Secondary | ICD-10-CM | POA: Diagnosis not present

## 2010-12-11 DIAGNOSIS — C8589 Other specified types of non-Hodgkin lymphoma, extranodal and solid organ sites: Secondary | ICD-10-CM | POA: Diagnosis not present

## 2010-12-12 ENCOUNTER — Other Ambulatory Visit: Payer: Self-pay | Admitting: Internal Medicine

## 2010-12-12 DIAGNOSIS — R269 Unspecified abnormalities of gait and mobility: Secondary | ICD-10-CM | POA: Diagnosis not present

## 2010-12-12 DIAGNOSIS — R5383 Other fatigue: Secondary | ICD-10-CM | POA: Diagnosis not present

## 2010-12-12 DIAGNOSIS — C8589 Other specified types of non-Hodgkin lymphoma, extranodal and solid organ sites: Secondary | ICD-10-CM | POA: Diagnosis not present

## 2010-12-12 DIAGNOSIS — C649 Malignant neoplasm of unspecified kidney, except renal pelvis: Secondary | ICD-10-CM | POA: Diagnosis not present

## 2010-12-12 DIAGNOSIS — F09 Unspecified mental disorder due to known physiological condition: Secondary | ICD-10-CM | POA: Diagnosis not present

## 2010-12-13 ENCOUNTER — Ambulatory Visit (HOSPITAL_COMMUNITY)
Admission: RE | Admit: 2010-12-13 | Discharge: 2010-12-13 | Disposition: A | Discharge status: Home / Self Care | Payer: Medicare Other | Source: Ambulatory Visit | Attending: Physician Assistant | Admitting: Physician Assistant

## 2010-12-13 DIAGNOSIS — C8589 Other specified types of non-Hodgkin lymphoma, extranodal and solid organ sites: Secondary | ICD-10-CM | POA: Diagnosis not present

## 2010-12-13 DIAGNOSIS — M949 Disorder of cartilage, unspecified: Secondary | ICD-10-CM | POA: Insufficient documentation

## 2010-12-13 DIAGNOSIS — C8330 Diffuse large B-cell lymphoma, unspecified site: Secondary | ICD-10-CM

## 2010-12-13 DIAGNOSIS — Z905 Acquired absence of kidney: Secondary | ICD-10-CM | POA: Insufficient documentation

## 2010-12-13 DIAGNOSIS — IMO0002 Reserved for concepts with insufficient information to code with codable children: Secondary | ICD-10-CM | POA: Insufficient documentation

## 2010-12-13 DIAGNOSIS — R269 Unspecified abnormalities of gait and mobility: Secondary | ICD-10-CM | POA: Diagnosis not present

## 2010-12-13 DIAGNOSIS — F09 Unspecified mental disorder due to known physiological condition: Secondary | ICD-10-CM | POA: Diagnosis not present

## 2010-12-13 DIAGNOSIS — C8590 Non-Hodgkin lymphoma, unspecified, unspecified site: Secondary | ICD-10-CM

## 2010-12-13 DIAGNOSIS — N83209 Unspecified ovarian cyst, unspecified side: Secondary | ICD-10-CM | POA: Insufficient documentation

## 2010-12-13 DIAGNOSIS — R5381 Other malaise: Secondary | ICD-10-CM | POA: Diagnosis not present

## 2010-12-13 DIAGNOSIS — C649 Malignant neoplasm of unspecified kidney, except renal pelvis: Secondary | ICD-10-CM | POA: Diagnosis not present

## 2010-12-13 DIAGNOSIS — M899 Disorder of bone, unspecified: Secondary | ICD-10-CM | POA: Insufficient documentation

## 2010-12-16 ENCOUNTER — Other Ambulatory Visit: Payer: Self-pay

## 2010-12-16 ENCOUNTER — Other Ambulatory Visit (HOSPITAL_BASED_OUTPATIENT_CLINIC_OR_DEPARTMENT_OTHER): Payer: Medicare Other | Admitting: Lab

## 2010-12-16 ENCOUNTER — Other Ambulatory Visit: Payer: Self-pay | Admitting: Internal Medicine

## 2010-12-16 ENCOUNTER — Ambulatory Visit (HOSPITAL_BASED_OUTPATIENT_CLINIC_OR_DEPARTMENT_OTHER): Payer: Medicare Other

## 2010-12-16 DIAGNOSIS — C649 Malignant neoplasm of unspecified kidney, except renal pelvis: Secondary | ICD-10-CM

## 2010-12-16 DIAGNOSIS — C8590 Non-Hodgkin lymphoma, unspecified, unspecified site: Secondary | ICD-10-CM

## 2010-12-16 DIAGNOSIS — Z5111 Encounter for antineoplastic chemotherapy: Secondary | ICD-10-CM

## 2010-12-16 DIAGNOSIS — C8589 Other specified types of non-Hodgkin lymphoma, extranodal and solid organ sites: Secondary | ICD-10-CM

## 2010-12-16 DIAGNOSIS — C8330 Diffuse large B-cell lymphoma, unspecified site: Secondary | ICD-10-CM

## 2010-12-16 LAB — CBC WITH DIFFERENTIAL/PLATELET
Basophils Absolute: 0 10*3/uL (ref 0.0–0.1)
EOS%: 0.9 % (ref 0.0–7.0)
Eosinophils Absolute: 0.1 10*3/uL (ref 0.0–0.5)
HCT: 30.9 % — ABNORMAL LOW (ref 34.8–46.6)
HGB: 10.5 g/dL — ABNORMAL LOW (ref 11.6–15.9)
MCH: 32.9 pg (ref 25.1–34.0)
MCV: 96.9 fL (ref 79.5–101.0)
MONO%: 6.8 % (ref 0.0–14.0)
NEUT%: 80.7 % — ABNORMAL HIGH (ref 38.4–76.8)
lymph#: 0.7 10*3/uL — ABNORMAL LOW (ref 0.9–3.3)

## 2010-12-16 LAB — COMPREHENSIVE METABOLIC PANEL
Albumin: 3.7 g/dL (ref 3.5–5.2)
Alkaline Phosphatase: 100 U/L (ref 39–117)
BUN: 12 mg/dL (ref 6–23)
CO2: 27 mEq/L (ref 19–32)
Glucose, Bld: 109 mg/dL — ABNORMAL HIGH (ref 70–99)
Potassium: 3.6 mEq/L (ref 3.5–5.3)

## 2010-12-16 MED ORDER — ONDANSETRON 16 MG/50ML IVPB (CHCC)
16.0000 mg | Freq: Once | INTRAVENOUS | Status: AC
  Administered 2010-12-16: 16 mg via INTRAVENOUS

## 2010-12-16 MED ORDER — ACETAMINOPHEN 325 MG PO TABS
650.0000 mg | ORAL_TABLET | Freq: Once | ORAL | Status: AC
  Administered 2010-12-16: 650 mg via ORAL

## 2010-12-16 MED ORDER — SODIUM CHLORIDE 0.9 % IV SOLN: 375.0000 mg/m2 | Freq: Once | INTRAVENOUS | Status: DC

## 2010-12-16 MED ORDER — DEXAMETHASONE SODIUM PHOSPHATE 4 MG/ML IJ SOLN
20.0000 mg | Freq: Once | INTRAMUSCULAR | Status: AC
  Administered 2010-12-16: 20 mg via INTRAVENOUS

## 2010-12-16 MED ORDER — SODIUM CHLORIDE 0.9 % IV SOLN
Freq: Once | INTRAVENOUS | Status: AC
  Administered 2010-12-16: 09:00:00 via INTRAVENOUS

## 2010-12-16 MED ORDER — HEPARIN SOD (PORK) LOCK FLUSH 100 UNIT/ML IV SOLN
500.0000 [IU] | Freq: Once | INTRAVENOUS | Status: AC | PRN
  Administered 2010-12-16: 500 [IU]
  Filled 2010-12-16: qty 5

## 2010-12-16 MED ORDER — SODIUM CHLORIDE 0.9 % IV SOLN
750.0000 mg/m2 | Freq: Once | INTRAVENOUS | Status: AC
  Administered 2010-12-16: 1140 mg via INTRAVENOUS
  Filled 2010-12-16: qty 57

## 2010-12-16 MED ORDER — DIPHENHYDRAMINE HCL 25 MG PO CAPS
50.0000 mg | ORAL_CAPSULE | Freq: Once | ORAL | Status: AC
  Administered 2010-12-16: 50 mg via ORAL

## 2010-12-16 MED ORDER — ALLOPURINOL 100 MG PO TABS: 100.0000 mg | ORAL_TABLET | Freq: Two times a day (BID) | ORAL | Status: DC

## 2010-12-16 MED ORDER — VINCRISTINE SULFATE CHEMO INJECTION 1 MG/ML
2.0000 mg | Freq: Once | INTRAVENOUS | Status: AC
  Administered 2010-12-16: 2 mg via INTRAVENOUS
  Filled 2010-12-16: qty 2

## 2010-12-16 MED ORDER — SODIUM CHLORIDE 0.9 % IV SOLN
375.0000 mg/m2 | Freq: Once | INTRAVENOUS | Status: AC
  Administered 2010-12-16: 600 mg via INTRAVENOUS
  Filled 2010-12-16: qty 60

## 2010-12-16 MED ORDER — SODIUM CHLORIDE 0.9 % IJ SOLN
10.0000 mL | INTRAMUSCULAR | Status: DC | PRN
  Administered 2010-12-16: 10 mL
  Filled 2010-12-16: qty 10

## 2010-12-16 MED ORDER — DOXORUBICIN HCL CHEMO IV INJECTION 2 MG/ML
50.0000 mg/m2 | Freq: Once | INTRAVENOUS | Status: AC
  Administered 2010-12-16: 76 mg via INTRAVENOUS
  Filled 2010-12-16: qty 38

## 2010-12-16 NOTE — Patient Instructions (Signed)
Patient discharged with no complaints; patient has medication for nausea and pain

## 2010-12-17 ENCOUNTER — Telehealth: Payer: Self-pay | Admitting: Internal Medicine

## 2010-12-17 ENCOUNTER — Encounter: Payer: Self-pay | Admitting: Physician Assistant

## 2010-12-17 ENCOUNTER — Ambulatory Visit: Payer: Medicare Other

## 2010-12-17 ENCOUNTER — Ambulatory Visit (HOSPITAL_BASED_OUTPATIENT_CLINIC_OR_DEPARTMENT_OTHER): Payer: Medicare Other | Admitting: Physician Assistant

## 2010-12-17 DIAGNOSIS — B370 Candidal stomatitis: Secondary | ICD-10-CM

## 2010-12-17 DIAGNOSIS — C8589 Other specified types of non-Hodgkin lymphoma, extranodal and solid organ sites: Secondary | ICD-10-CM

## 2010-12-17 DIAGNOSIS — C8590 Non-Hodgkin lymphoma, unspecified, unspecified site: Secondary | ICD-10-CM

## 2010-12-17 DIAGNOSIS — C8330 Diffuse large B-cell lymphoma, unspecified site: Secondary | ICD-10-CM

## 2010-12-17 DIAGNOSIS — Z85528 Personal history of other malignant neoplasm of kidney: Secondary | ICD-10-CM

## 2010-12-17 HISTORY — DX: Candidal stomatitis: B37.0

## 2010-12-17 MED ORDER — NYSTATIN 100000 UNIT/ML MT SUSP: 5.0000 mL | Freq: Four times a day (QID) | OROMUCOSAL | Status: DC

## 2010-12-17 MED ORDER — PEGFILGRASTIM INJECTION 6 MG/0.6ML
6.0000 mg | Freq: Once | SUBCUTANEOUS | Status: DC
  Filled 2010-12-17: qty 0.6

## 2010-12-17 MED ORDER — FLUCONAZOLE 100 MG PO TABS: ORAL_TABLET | ORAL | Status: DC

## 2010-12-17 MED ORDER — PEGFILGRASTIM INJECTION 6 MG/0.6ML
6.0000 mg | Freq: Once | SUBCUTANEOUS | Status: AC
  Administered 2010-12-17: 6 mg via SUBCUTANEOUS
  Filled 2010-12-17: qty 0.6

## 2010-12-17 NOTE — Telephone Encounter (Signed)
gve the pt her dec,jan 2013 appt calendar °

## 2010-12-18 ENCOUNTER — Other Ambulatory Visit: Payer: Self-pay | Admitting: Internal Medicine

## 2010-12-18 ENCOUNTER — Other Ambulatory Visit: Payer: Self-pay | Admitting: Physician Assistant

## 2010-12-18 DIAGNOSIS — C8589 Other specified types of non-Hodgkin lymphoma, extranodal and solid organ sites: Secondary | ICD-10-CM | POA: Diagnosis not present

## 2010-12-18 DIAGNOSIS — R5383 Other fatigue: Secondary | ICD-10-CM | POA: Diagnosis not present

## 2010-12-18 DIAGNOSIS — R269 Unspecified abnormalities of gait and mobility: Secondary | ICD-10-CM | POA: Diagnosis not present

## 2010-12-18 DIAGNOSIS — C649 Malignant neoplasm of unspecified kidney, except renal pelvis: Secondary | ICD-10-CM | POA: Diagnosis not present

## 2010-12-18 DIAGNOSIS — F09 Unspecified mental disorder due to known physiological condition: Secondary | ICD-10-CM | POA: Diagnosis not present

## 2010-12-18 NOTE — Telephone Encounter (Signed)
Called [facility] and clarified rx for nystatin to please change vol from 60 to 240 ml. Called pt with information

## 2010-12-18 NOTE — Progress Notes (Signed)
No images are attached to the encounter. No scans are attached to the encounter. No scans are attached to the encounter. [facility] OFFICE PROGRESS NOTE  Melissa Cross M.D. Melissa Cross M.D.  DIAGNOSIS: #1 non-Hodgkin's lymphoma diagnosed on bone marrow biopsy in June of 2012 #2 stage III (T3a N0M0) renal cell carcinoma diagnosed in June of 2012  PRIOR THERAPY: Status post right nephrectomy under the care of Dr. Retta Cross on 09/02/2010  CURRENT THERAPY: Systemic chemotherapy with CHOP/Rituxan given every 3 weeks, status post 4 cycles  INTERVAL HISTORY: Melissa Cross 77 y.o. female returns for a scheduled regular three-week visit for followup of her non-Hodgkin's lymphoma. She is tolerating the prednisone and 50 mg however develops recurrent thrush. She required both Diflucan and nystatin to resolve her most recent episode. She would like to avoid any further emergency room visits/hospital admissions if at all possible secondary to severe thrush and dehydration. She reports that she takes the third of a 20 mg simvastatin tablet, which is apparently enough to control her hyperlipidemia. She voiced no other complaints today. MEDICAL HISTORY: Past Medical History  Diagnosis Date  . Renal cell carcinoma   . Hypertension   . Hypercholesteremia   . Non Hodgkin's lymphoma   . Glaucoma   . Arthritis   . Anemia   . Stroke     hx of TIA  . Blood transfusion   . Hypothyroidism   . Hypothyroid     on synthroid   . Cancer     kidney cancer, NHL   . Cancer     last chemo 11/04/10   . Thrush 12/17/2010    ALLERGIES:  is allergic to dilaudid.  MEDICATIONS:  Current Outpatient Prescriptions  Medication Sig Dispense Refill  . allopurinol (ZYLOPRIM) 100 MG tablet Take 1 tablet (100 mg total) by mouth 2 (two) times daily.  60 tablet  1  . aspirin 81 MG tablet Take 81 mg by mouth daily.       . Boost Plus (BOOST PLUS) LIQD Take 237 mLs by mouth 2 (two) times daily between  meals.  60 Can  0  . cholecalciferol (VITAMIN D) 400 UNITS TABS Take 400 Units by mouth daily.        . ciprofloxacin (CIPRO) 500 MG tablet       . colchicine 0.6 MG tablet Take 1 tablet (0.6 mg total) by mouth as directed.  15 tablet  0  . cyanocobalamin 1000 MCG tablet Take 100 mcg by mouth daily.        . fluconazole (DIFLUCAN) 100 MG tablet 2 tabs by mouth stat, then 1 tab by mouth daily until completed  16 tablet  2  . Garlic 1000 MG CAPS Take 1,000 mg by mouth daily.        . Ginger, Zingiber officinalis, (GINGER ROOT) 550 MG CAPS Take 550 mg by mouth daily.        [name] levothyroxine (SYNTHROID, LEVOTHROID) 50 MCG tablet Take 50 mcg by mouth daily.        [name] lidocaine-prilocaine (EMLA) cream       . Multiple Vitamins-Minerals (MULTIVITAMIN WITH MINERALS) tablet Take 1 tablet by mouth daily.        . nadolol (CORGARD) 20 MG tablet Take 20 mg by mouth daily.        [name] nystatin (MYCOSTATIN) 100000 UNIT/ML suspension Take 5 mLs (500,000 Units total) by mouth 4 (four) times daily.  60 mL  2  . predniSONE (DELTASONE) 50  MG tablet       . prochlorperazine (COMPAZINE) 10 MG tablet       . simvastatin (ZOCOR) 5 MG tablet Take 7 mg by mouth at bedtime.         Current Facility-Administered Medications  Medication Dose Route Frequency Provider Last Rate Last Dose  . pegfilgrastim (NEULASTA) injection 6 mg  6 mg Subcutaneous Once Melissa K. Arbutus Ped, MD        SURGICAL HISTORY:  Past Surgical History  Procedure Date  . Nephrectomy     Right Side  . Tonsillectomy   . Portacath placement     right subclavian    REVIEW OF SYSTEMS:  A comprehensive review of systems was negative except for: Ears, nose, mouth, throat, and face: positive for sore mouth and sore throat   PHYSICAL EXAMINATION: General appearance: alert, cooperative, appears stated age and no distress Head: Normocephalic, without obvious abnormality, atraumatic Neck: no adenopathy, no carotid bruit, no JVD, supple, symmetrical,  trachea midline and thyroid not enlarged, symmetric, no tenderness/mass/nodules Lymph nodes: Cervical, supraclavicular, and axillary nodes normal. Resp: clear to auscultation bilaterally Cardio: regular rate and rhythm, S1, S2 normal, no murmur, click, rub or gallop GI: soft, non-tender; bowel sounds normal; no masses,  no organomegaly Extremities: No point tenderness pitting edema or palpable cords however there remains some mild tenderness erythema, and warmth to the right wrist and base of the right some. Mouth: oropharynx reveals thrush  ECOG PERFORMANCE STATUS: 1 - Symptomatic but completely ambulatory  Blood pressure 132/67, pulse 69, temperature 97.3 F (36.3 C), temperature source Oral, height 5\' 3"  (1.6 m), weight 118 lb (53.524 kg).  LABORATORY DATA: Lab Results  Component Value Date   WBC 6.4 12/16/2010   HGB 10.5* 12/16/2010   HCT 30.9* 12/16/2010   MCV 96.9 12/16/2010   PLT 239 12/16/2010      Chemistry      Component Value Date/Time   NA 142 12/16/2010 0832   K 3.6 12/16/2010 0832   CL 109 12/16/2010 0832   CO2 27 12/16/2010 0832   BUN 12 12/16/2010 0832   CREATININE 0.79 12/16/2010 0832      Component Value Date/Time   CALCIUM 9.3 12/16/2010 0832   ALKPHOS 100 12/16/2010 0832   AST 20 12/16/2010 0832   ALT 13 12/16/2010 0832   BILITOT 0.3 12/16/2010 0832       RADIOGRAPHIC STUDIES:  No results found.  ASSESSMENT/PLAN: The patient is a pleasant 77 year old white female with non-Hodgkin's lymphoma status post 4 cycles of CHOP Rituxan. She also has a history of stage III renal cell carcinoma status post right nephrectomy. Patient was discussed with Dr. Arbutus Cross. We will continue the nystatin and Diflucan through the remaining courses of her CHOP/Rituxan to address her problem with recurrent oral candidiasis. She'll continue with weekly labs as scheduled and return in 3 weeks prior to her next scheduled cycle of systemic chemotherapy with CHOP/Rituxan. She  should be need further evaluation in the interim she is to return sooner.   [name], PA-C     All questions were answered. The patient knows to call the clinic with any problems, questions or concerns. We can certainly see the patient much sooner if necessary.

## 2010-12-20 DIAGNOSIS — F09 Unspecified mental disorder due to known physiological condition: Secondary | ICD-10-CM | POA: Diagnosis not present

## 2010-12-20 DIAGNOSIS — R269 Unspecified abnormalities of gait and mobility: Secondary | ICD-10-CM | POA: Diagnosis not present

## 2010-12-20 DIAGNOSIS — C649 Malignant neoplasm of unspecified kidney, except renal pelvis: Secondary | ICD-10-CM | POA: Diagnosis not present

## 2010-12-20 DIAGNOSIS — R5383 Other fatigue: Secondary | ICD-10-CM | POA: Diagnosis not present

## 2010-12-20 DIAGNOSIS — C8589 Other specified types of non-Hodgkin lymphoma, extranodal and solid organ sites: Secondary | ICD-10-CM | POA: Diagnosis not present

## 2010-12-23 ENCOUNTER — Other Ambulatory Visit (HOSPITAL_BASED_OUTPATIENT_CLINIC_OR_DEPARTMENT_OTHER): Payer: Medicare Other | Admitting: Lab

## 2010-12-23 ENCOUNTER — Telehealth: Payer: Self-pay | Admitting: *Deleted

## 2010-12-23 ENCOUNTER — Other Ambulatory Visit: Payer: Self-pay | Admitting: Physician Assistant

## 2010-12-23 DIAGNOSIS — R5383 Other fatigue: Secondary | ICD-10-CM | POA: Diagnosis not present

## 2010-12-23 DIAGNOSIS — R269 Unspecified abnormalities of gait and mobility: Secondary | ICD-10-CM | POA: Diagnosis not present

## 2010-12-23 DIAGNOSIS — F09 Unspecified mental disorder due to known physiological condition: Secondary | ICD-10-CM | POA: Diagnosis not present

## 2010-12-23 DIAGNOSIS — C8590 Non-Hodgkin lymphoma, unspecified, unspecified site: Secondary | ICD-10-CM

## 2010-12-23 DIAGNOSIS — C8589 Other specified types of non-Hodgkin lymphoma, extranodal and solid organ sites: Secondary | ICD-10-CM | POA: Diagnosis not present

## 2010-12-23 DIAGNOSIS — C649 Malignant neoplasm of unspecified kidney, except renal pelvis: Secondary | ICD-10-CM | POA: Diagnosis not present

## 2010-12-23 LAB — CBC WITH DIFFERENTIAL/PLATELET
Basophils Absolute: 0 10*3/uL (ref 0.0–0.1)
Eosinophils Absolute: 0.1 10*3/uL (ref 0.0–0.5)
HGB: 10.4 g/dL — ABNORMAL LOW (ref 11.6–15.9)
MCV: 98.4 fL (ref 79.5–101.0)
MONO#: 0 10*3/uL — ABNORMAL LOW (ref 0.1–0.9)
NEUT#: 0 10*3/uL — CL (ref 1.5–6.5)
RBC: 3.13 10*6/uL — ABNORMAL LOW (ref 3.70–5.45)
RDW: 19.4 % — ABNORMAL HIGH (ref 11.2–14.5)
WBC: 0.3 10*3/uL — CL (ref 3.9–10.3)
lymph#: 0.2 10*3/uL — ABNORMAL LOW (ref 0.9–3.3)
nRBC: 0 % (ref 0–0)

## 2010-12-23 LAB — COMPREHENSIVE METABOLIC PANEL
ALT: 19 U/L (ref 0–35)
Albumin: 3.9 g/dL (ref 3.5–5.2)
CO2: 35 mEq/L — ABNORMAL HIGH (ref 19–32)
Chloride: 99 mEq/L (ref 96–112)
Glucose, Bld: 119 mg/dL — ABNORMAL HIGH (ref 70–99)
Potassium: 3.8 mEq/L (ref 3.5–5.3)
Sodium: 139 mEq/L (ref 135–145)
Total Protein: 5.9 g/dL — ABNORMAL LOW (ref 6.0–8.3)

## 2010-12-23 NOTE — Telephone Encounter (Signed)
ANC 0.0, WBC 0.3.  Per Dr Donnald Garre, pt needs to have rx for prophylactic cipro 500mg  BID x 5 days.  She needs to take rx if she develops fever or feels like she is getting sick.  Called and spoke with pt.  She verbalized understanding and said that she already has about 8 tabs of cipro 500mg  and will call if she ends up taking it so we can call in more to complete her 5 days.  Pt also verbalized understanding regarding neutropenic pxns.  SLJ

## 2010-12-25 DIAGNOSIS — R269 Unspecified abnormalities of gait and mobility: Secondary | ICD-10-CM | POA: Diagnosis not present

## 2010-12-25 DIAGNOSIS — C8589 Other specified types of non-Hodgkin lymphoma, extranodal and solid organ sites: Secondary | ICD-10-CM | POA: Diagnosis not present

## 2010-12-25 DIAGNOSIS — F09 Unspecified mental disorder due to known physiological condition: Secondary | ICD-10-CM | POA: Diagnosis not present

## 2010-12-25 DIAGNOSIS — C649 Malignant neoplasm of unspecified kidney, except renal pelvis: Secondary | ICD-10-CM | POA: Diagnosis not present

## 2010-12-25 DIAGNOSIS — R5381 Other malaise: Secondary | ICD-10-CM | POA: Diagnosis not present

## 2010-12-27 DIAGNOSIS — R5381 Other malaise: Secondary | ICD-10-CM | POA: Diagnosis not present

## 2010-12-27 DIAGNOSIS — C649 Malignant neoplasm of unspecified kidney, except renal pelvis: Secondary | ICD-10-CM | POA: Diagnosis not present

## 2010-12-27 DIAGNOSIS — C8589 Other specified types of non-Hodgkin lymphoma, extranodal and solid organ sites: Secondary | ICD-10-CM | POA: Diagnosis not present

## 2010-12-27 DIAGNOSIS — R269 Unspecified abnormalities of gait and mobility: Secondary | ICD-10-CM | POA: Diagnosis not present

## 2010-12-27 DIAGNOSIS — F09 Unspecified mental disorder due to known physiological condition: Secondary | ICD-10-CM | POA: Diagnosis not present

## 2010-12-30 ENCOUNTER — Other Ambulatory Visit (HOSPITAL_BASED_OUTPATIENT_CLINIC_OR_DEPARTMENT_OTHER): Payer: Medicare Other

## 2010-12-30 DIAGNOSIS — D649 Anemia, unspecified: Secondary | ICD-10-CM

## 2010-12-30 DIAGNOSIS — C8590 Non-Hodgkin lymphoma, unspecified, unspecified site: Secondary | ICD-10-CM

## 2010-12-30 LAB — COMPREHENSIVE METABOLIC PANEL
ALT: 12 U/L (ref 0–35)
Albumin: 4.1 g/dL (ref 3.5–5.2)
Alkaline Phosphatase: 108 U/L (ref 39–117)
CO2: 30 mEq/L (ref 19–32)
Glucose, Bld: 88 mg/dL (ref 70–99)
Potassium: 4 mEq/L (ref 3.5–5.3)
Sodium: 143 mEq/L (ref 135–145)
Total Bilirubin: 0.5 mg/dL (ref 0.3–1.2)
Total Protein: 5.9 g/dL — ABNORMAL LOW (ref 6.0–8.3)

## 2010-12-30 LAB — CBC WITH DIFFERENTIAL/PLATELET
BASO%: 0.3 % (ref 0.0–2.0)
Eosinophils Absolute: 0 10*3/uL (ref 0.0–0.5)
LYMPH%: 6.4 % — ABNORMAL LOW (ref 14.0–49.7)
MCHC: 34.3 g/dL (ref 31.5–36.0)
MONO#: 0.2 10*3/uL (ref 0.1–0.9)
MONO%: 2.2 % (ref 0.0–14.0)
NEUT#: 7.9 10*3/uL — ABNORMAL HIGH (ref 1.5–6.5)
Platelets: 186 10*3/uL (ref 145–400)
RBC: 2.77 10*6/uL — ABNORMAL LOW (ref 3.70–5.45)
RDW: 24.2 % — ABNORMAL HIGH (ref 11.2–14.5)
WBC: 8.7 10*3/uL (ref 3.9–10.3)

## 2011-01-03 ENCOUNTER — Emergency Department (HOSPITAL_COMMUNITY)
Admission: EM | Admit: 2011-01-03 | Discharge: 2011-01-03 | Disposition: A | Discharge status: Home / Self Care | Payer: Medicare Other | Attending: Emergency Medicine | Admitting: Emergency Medicine

## 2011-01-03 ENCOUNTER — Encounter (HOSPITAL_COMMUNITY): Payer: Self-pay | Admitting: Emergency Medicine

## 2011-01-03 DIAGNOSIS — E039 Hypothyroidism, unspecified: Secondary | ICD-10-CM | POA: Insufficient documentation

## 2011-01-03 DIAGNOSIS — M79606 Pain in leg, unspecified: Secondary | ICD-10-CM

## 2011-01-03 DIAGNOSIS — R269 Unspecified abnormalities of gait and mobility: Secondary | ICD-10-CM | POA: Diagnosis not present

## 2011-01-03 DIAGNOSIS — I10 Essential (primary) hypertension: Secondary | ICD-10-CM | POA: Insufficient documentation

## 2011-01-03 DIAGNOSIS — Z87898 Personal history of other specified conditions: Secondary | ICD-10-CM | POA: Insufficient documentation

## 2011-01-03 DIAGNOSIS — M79609 Pain in unspecified limb: Secondary | ICD-10-CM | POA: Insufficient documentation

## 2011-01-03 DIAGNOSIS — C8589 Other specified types of non-Hodgkin lymphoma, extranodal and solid organ sites: Secondary | ICD-10-CM | POA: Diagnosis not present

## 2011-01-03 DIAGNOSIS — R5381 Other malaise: Secondary | ICD-10-CM | POA: Diagnosis not present

## 2011-01-03 DIAGNOSIS — F09 Unspecified mental disorder due to known physiological condition: Secondary | ICD-10-CM | POA: Diagnosis not present

## 2011-01-03 DIAGNOSIS — C649 Malignant neoplasm of unspecified kidney, except renal pelvis: Secondary | ICD-10-CM | POA: Diagnosis not present

## 2011-01-03 NOTE — ED Provider Notes (Signed)
History     CSN: [id number]  Arrival date & time 01/03/11  1345   First MD Initiated Contact with Patient 01/03/11 1512      Chief Complaint  Patient presents with  . Leg Pain    (Consider location/radiation/quality/duration/timing/severity/associated sxs/prior treatment) Patient is a 77 y.o. female presenting with leg pain. The history is provided by the patient.  Leg Pain   pt c/o left le swelling x 5 days--sharp pain, constant, located at calf--denies chest pain, sob, syncope--pain worse with standing, better with nothing, no tx done pta--called her pcp, told to come here for a doppler to r/o dvt   Past Medical History  Diagnosis Date  . Renal cell carcinoma   . Hypertension   . Hypercholesteremia   . Non Hodgkin's lymphoma   . Glaucoma   . Arthritis   . Anemia   . Stroke     hx of TIA  . Blood transfusion   . Hypothyroidism   . Hypothyroid     on synthroid   . Cancer     kidney cancer, NHL   . Cancer     last chemo 11/04/10   . Thrush 12/17/2010    Past Surgical History  Procedure Date  . Nephrectomy     Right Side  . Tonsillectomy   . Portacath placement     right subclavian    No family history on file.  History  Substance Use Topics  . Smoking status: Former Smoker -- 0.5 packs/day for 25 years  . Smokeless tobacco: Not on file  . Alcohol Use: No    OB History    Grav Para Term Preterm Abortions TAB SAB Ect Mult Living                  Review of Systems  All other systems reviewed and are negative.    Allergies  Dilaudid  Home Medications   Current Outpatient Rx  Name Route Sig Dispense Refill  . ALLOPURINOL 100 MG PO TABS Oral Take 1 tablet (100 mg total) by mouth 2 (two) times daily. 60 tablet 1  . ASPIRIN 81 MG PO TABS Oral Take 81 mg by mouth daily.     [name] BOOST PLUS PO LIQD Oral Take 237 mLs by mouth 2 (two) times daily as needed. For nutrition.     . CHOLECALCIFEROL 400 UNITS PO TABS Oral Take 400 Units by mouth daily.        [name] FLUCONAZOLE 100 MG PO TABS  2 tabs by mouth stat, then 1 tab by mouth daily until completed 16 tablet 2  . GARLIC 1000 MG PO CAPS Oral Take 1,000 mg by mouth daily.      [name] GINGER ROOT 550 MG PO CAPS Oral Take 550 mg by mouth daily.      [name] LEVOTHYROXINE SODIUM 50 MCG PO TABS Oral Take 50 mcg by mouth daily.      [name] LIDOCAINE-PRILOCAINE 2.5-2.5 % EX CREA Topical Apply 1 application topically as needed. For port-a-cath access.    . ADULT MULTIVITAMIN W/MINERALS CH Oral Take 1 tablet by mouth daily.      [name] NADOLOL 20 MG PO TABS Oral Take 20 mg by mouth daily.      . NYSTATIN 100000 UNIT/ML MT SUSP Oral Take 500,000 Units by mouth 4 (four) times daily as needed. For thrush.     [name] PREDNISONE 50 MG PO TABS Oral Take 50 mg by mouth daily. 5 day course of therapy post-chemo  treatments.    [name] SIMVASTATIN 20 MG PO TABS Oral Take 7 mg by mouth at bedtime.        BP 167/79  Pulse 75  Temp(Src) 98.2 F (36.8 C) (Oral)  SpO2 100%  Physical Exam  Nursing note and vitals reviewed. Constitutional: She is oriented to person, place, and time. She appears well-developed and well-nourished.  Non-toxic appearance. No distress.  HENT:  Head: Normocephalic and atraumatic.  Eyes: Conjunctivae, EOM and lids are normal. Pupils are equal, round, and reactive to light.  Neck: Normal range of motion. Neck supple. No tracheal deviation present. No mass present.  Cardiovascular: Normal rate, regular rhythm and normal heart sounds.  Exam reveals no gallop.   No murmur heard. Pulmonary/Chest: Effort normal and breath sounds normal. No stridor. No respiratory distress. She has no decreased breath sounds. She has no wheezes. She has no rhonchi. She has no rales.  Abdominal: Soft. Normal appearance and bowel sounds are normal. She exhibits no distension. There is no tenderness. There is no rebound and no CVA tenderness.  Musculoskeletal: Normal range of motion. She exhibits no edema and no tenderness.       Left lower  leg: She exhibits tenderness. She exhibits no bony tenderness, no swelling and no deformity.  Neurological: She is alert and oriented to person, place, and time. She has normal strength. No cranial nerve deficit or sensory deficit. GCS eye subscore is 4. GCS verbal subscore is 5. GCS motor subscore is 6.  Skin: Skin is warm and dry. No abrasion and no rash noted.  Psychiatric: She has a normal mood and affect. Her speech is normal and behavior is normal.    ED Course  Procedures (including critical care time)  Labs Reviewed - No data to display No results found.   No diagnosis found.    MDM  Patient had a negative Doppler study. Will be discharged home and will followup with her Dr. as needed. Will be instructed to use Motrin as needed for pain        Toy Baker, MD 01/03/11 [phone]

## 2011-01-03 NOTE — ED Notes (Signed)
Pt reports having pain to left calf area intermittently x5 days. No warmth noted, non-tender to touch. Denies pain at this time. Denies sob or cp. Some mild swelling noted. Pedal pulses present bilaterally. NAD noted at this time.

## 2011-01-03 NOTE — ED Notes (Signed)
  Doppler study negative per vascular tech.

## 2011-01-03 NOTE — Progress Notes (Signed)
VASCULAR LAB PRELIMINARY  PRELIMINARY  PRELIMINARY  PRELIMINARY  Left lower extremity venous duplex  completed.    Preliminary report:  Left:  No evidence of DVT, superficial thrombosis, or Baker's cyst.   Terance Hart, RVT 01/03/2011, 3:58 PM

## 2011-01-03 NOTE — ED Notes (Signed)
Pt states that for the past 5 days now she has had pain to lt calf area and foot, not at this time but previous it was pain to calf when lifting foot, no sob, no chest pain, no injury

## 2011-01-03 NOTE — ED Notes (Signed)
Dr Allen at bedside  

## 2011-01-07 ENCOUNTER — Other Ambulatory Visit: Payer: Self-pay | Admitting: Internal Medicine

## 2011-01-08 ENCOUNTER — Ambulatory Visit (HOSPITAL_BASED_OUTPATIENT_CLINIC_OR_DEPARTMENT_OTHER): Payer: Medicare Other | Admitting: Physician Assistant

## 2011-01-08 ENCOUNTER — Other Ambulatory Visit: Payer: Medicare Other | Admitting: Lab

## 2011-01-08 ENCOUNTER — Ambulatory Visit (HOSPITAL_BASED_OUTPATIENT_CLINIC_OR_DEPARTMENT_OTHER): Payer: Medicare Other

## 2011-01-08 ENCOUNTER — Telehealth: Payer: Self-pay | Admitting: Internal Medicine

## 2011-01-08 DIAGNOSIS — Z5111 Encounter for antineoplastic chemotherapy: Secondary | ICD-10-CM

## 2011-01-08 DIAGNOSIS — C649 Malignant neoplasm of unspecified kidney, except renal pelvis: Secondary | ICD-10-CM

## 2011-01-08 DIAGNOSIS — C8589 Other specified types of non-Hodgkin lymphoma, extranodal and solid organ sites: Secondary | ICD-10-CM

## 2011-01-08 DIAGNOSIS — C8590 Non-Hodgkin lymphoma, unspecified, unspecified site: Secondary | ICD-10-CM

## 2011-01-08 DIAGNOSIS — C8330 Diffuse large B-cell lymphoma, unspecified site: Secondary | ICD-10-CM

## 2011-01-08 LAB — COMPREHENSIVE METABOLIC PANEL WITH GFR
ALT: 13 U/L (ref 0–35)
AST: 20 U/L (ref 0–37)
Albumin: 3.8 g/dL (ref 3.5–5.2)
Alkaline Phosphatase: 95 U/L (ref 39–117)
BUN: 12 mg/dL (ref 6–23)
CO2: 27 meq/L (ref 19–32)
Calcium: 9.2 mg/dL (ref 8.4–10.5)
Chloride: 107 meq/L (ref 96–112)
Creatinine, Ser: 0.89 mg/dL (ref 0.50–1.10)
Glucose, Bld: 121 mg/dL — ABNORMAL HIGH (ref 70–99)
Potassium: 3.7 meq/L (ref 3.5–5.3)
Sodium: 143 meq/L (ref 135–145)
Total Bilirubin: 0.5 mg/dL (ref 0.3–1.2)
Total Protein: 5.4 g/dL — ABNORMAL LOW (ref 6.0–8.3)

## 2011-01-08 LAB — CBC WITH DIFFERENTIAL/PLATELET
BASO%: 0.6 % (ref 0.0–2.0)
Basophils Absolute: 0 10*3/uL (ref 0.0–0.1)
EOS%: 1.3 % (ref 0.0–7.0)
Eosinophils Absolute: 0.1 10*3/uL (ref 0.0–0.5)
HCT: 30.2 % — ABNORMAL LOW (ref 34.8–46.6)
HGB: 10 g/dL — ABNORMAL LOW (ref 11.6–15.9)
LYMPH%: 8.4 % — ABNORMAL LOW (ref 14.0–49.7)
MCH: 34.5 pg — ABNORMAL HIGH (ref 25.1–34.0)
MCHC: 33.1 g/dL (ref 31.5–36.0)
MCV: 104.1 fL — ABNORMAL HIGH (ref 79.5–101.0)
MONO#: 0.5 10*3/uL (ref 0.1–0.9)
MONO%: 6.8 % (ref 0.0–14.0)
NEUT#: 5.8 10*3/uL (ref 1.5–6.5)
NEUT%: 82.9 % — ABNORMAL HIGH (ref 38.4–76.8)
Platelets: 284 10*3/uL (ref 145–400)
RBC: 2.9 10*6/uL — ABNORMAL LOW (ref 3.70–5.45)
RDW: 20.2 % — ABNORMAL HIGH (ref 11.2–14.5)
WBC: 7 10*3/uL (ref 3.9–10.3)
lymph#: 0.6 10*3/uL — ABNORMAL LOW (ref 0.9–3.3)
nRBC: 0 % (ref 0–0)

## 2011-01-08 LAB — LACTATE DEHYDROGENASE: LDH: 167 U/L (ref 94–250)

## 2011-01-08 MED ORDER — RITUXIMAB CHEMO INJECTION 10 MG/ML
375.0000 mg/m2 | Freq: Once | INTRAVENOUS | Status: AC
  Administered 2011-01-08: 600 mg via INTRAVENOUS
  Filled 2011-01-08: qty 60

## 2011-01-08 MED ORDER — ONDANSETRON 16 MG/50ML IVPB (CHCC)
16.0000 mg | Freq: Once | INTRAVENOUS | Status: AC
  Administered 2011-01-08: 16 mg via INTRAVENOUS

## 2011-01-08 MED ORDER — DOXORUBICIN HCL CHEMO IV INJECTION 2 MG/ML
50.0000 mg/m2 | Freq: Once | INTRAVENOUS | Status: AC
  Administered 2011-01-08: 76 mg via INTRAVENOUS
  Filled 2011-01-08: qty 38

## 2011-01-08 MED ORDER — VINCRISTINE SULFATE CHEMO INJECTION 1 MG/ML
2.0000 mg | Freq: Once | INTRAVENOUS | Status: AC
  Administered 2011-01-08: 2 mg via INTRAVENOUS
  Filled 2011-01-08: qty 2

## 2011-01-08 MED ORDER — DIPHENHYDRAMINE HCL 25 MG PO CAPS
50.0000 mg | ORAL_CAPSULE | Freq: Once | ORAL | Status: AC
  Administered 2011-01-08: 50 mg via ORAL

## 2011-01-08 MED ORDER — DEXAMETHASONE SODIUM PHOSPHATE 4 MG/ML IJ SOLN
20.0000 mg | Freq: Once | INTRAMUSCULAR | Status: AC
  Administered 2011-01-08: 20 mg via INTRAVENOUS

## 2011-01-08 MED ORDER — CYCLOPHOSPHAMIDE CHEMO INJECTION 1 GM
750.0000 mg/m2 | Freq: Once | INTRAMUSCULAR | Status: AC
  Administered 2011-01-08: 1140 mg via INTRAVENOUS
  Filled 2011-01-08: qty 57

## 2011-01-08 MED ORDER — PREDNISONE 50 MG PO TABS: 50.0000 mg | ORAL_TABLET | Freq: Every day | ORAL | Status: DC

## 2011-01-08 MED ORDER — ACETAMINOPHEN 325 MG PO TABS
650.0000 mg | ORAL_TABLET | Freq: Once | ORAL | Status: AC
  Administered 2011-01-08: 650 mg via ORAL

## 2011-01-08 MED ORDER — SODIUM CHLORIDE 0.9 % IV SOLN
Freq: Once | INTRAVENOUS | Status: AC
  Administered 2011-01-08: 09:00:00 via INTRAVENOUS

## 2011-01-08 NOTE — Telephone Encounter (Signed)
appt made for adrena and rx and labs 1/7 1/14 and 1/21,also inj for 1/'22 printed for pt   aom

## 2011-01-08 NOTE — Patient Instructions (Signed)
Patient ambulatory out of clinic with family member.  Instructed patient to call with any issues.  Patient aware of next appointment 

## 2011-01-09 ENCOUNTER — Ambulatory Visit (HOSPITAL_BASED_OUTPATIENT_CLINIC_OR_DEPARTMENT_OTHER): Payer: Medicare Other

## 2011-01-09 DIAGNOSIS — C8589 Other specified types of non-Hodgkin lymphoma, extranodal and solid organ sites: Secondary | ICD-10-CM | POA: Diagnosis not present

## 2011-01-09 DIAGNOSIS — F09 Unspecified mental disorder due to known physiological condition: Secondary | ICD-10-CM | POA: Diagnosis not present

## 2011-01-09 DIAGNOSIS — C8330 Diffuse large B-cell lymphoma, unspecified site: Secondary | ICD-10-CM

## 2011-01-09 DIAGNOSIS — R5381 Other malaise: Secondary | ICD-10-CM | POA: Diagnosis not present

## 2011-01-09 DIAGNOSIS — C649 Malignant neoplasm of unspecified kidney, except renal pelvis: Secondary | ICD-10-CM | POA: Diagnosis not present

## 2011-01-09 DIAGNOSIS — R269 Unspecified abnormalities of gait and mobility: Secondary | ICD-10-CM | POA: Diagnosis not present

## 2011-01-09 DIAGNOSIS — C8590 Non-Hodgkin lymphoma, unspecified, unspecified site: Secondary | ICD-10-CM

## 2011-01-09 MED ORDER — PEGFILGRASTIM INJECTION 6 MG/0.6ML
6.0000 mg | Freq: Once | SUBCUTANEOUS | Status: AC
  Administered 2011-01-09: 6 mg via SUBCUTANEOUS
  Filled 2011-01-09: qty 0.6

## 2011-01-09 NOTE — Progress Notes (Signed)
No images are attached to the encounter. No scans are attached to the encounter. No scans are attached to the encounter. [facility] OFFICE PROGRESS NOTE  Marcine Matar M.D. Mila Palmer M.D.  DIAGNOSIS: #1 non-Hodgkin's lymphoma diagnosed on bone marrow biopsy in June of 2012 #2 stage III (T3a N0M0) renal cell carcinoma diagnosed in June of 2012  PRIOR THERAPY: Status post right nephrectomy under the care of Dr. Retta Diones on 09/02/2010  CURRENT THERAPY: Systemic chemotherapy with CHOP/Rituxan given every 3 weeks, status post 4 cycles  INTERVAL HISTORY: Melissa Cross 78 y.o. female returns for a scheduled regular three-week visit for followup of her non-Hodgkin's lymphoma. She is tolerating the prednisone and 50 mg however develops recurrent thrush. She requests a refill for her prednisone. She reports she had an emergency room visit to have her left leg pain evaluated. She apparently had left leg aching acutely worse with weightbearing. She has a visiting nurse come once a week and was advised to the emergency room for evaluation to rule out deep vein thrombosis. Studies were negative for deep vein thrombosis and her left leg pain has completely resolved. She voiced no other complaints. She would like to change her chemotherapy days back to either Monday or Friday as this is easier for her son to transport her.   MEDICAL HISTORY: Past Medical History  Diagnosis Date  . Renal cell carcinoma   . Hypertension   . Hypercholesteremia   . Non Hodgkin's lymphoma   . Glaucoma   . Arthritis   . Anemia   . Stroke     hx of TIA  . Blood transfusion   . Hypothyroidism   . Hypothyroid     on synthroid   . Cancer     kidney cancer, NHL   . Cancer     last chemo 11/04/10   . Thrush 12/17/2010    ALLERGIES:  is allergic to dilaudid.  MEDICATIONS:  Current Outpatient Prescriptions  Medication Sig Dispense Refill  . allopurinol (ZYLOPRIM) 100 MG tablet Take 1 tablet  (100 mg total) by mouth 2 (two) times daily.  60 tablet  1  . aspirin 81 MG tablet Take 81 mg by mouth daily.       . Boost Plus (BOOST PLUS) LIQD Take 237 mLs by mouth 2 (two) times daily as needed. For nutrition.       . cholecalciferol (VITAMIN D) 400 UNITS TABS Take 400 Units by mouth daily.        . Garlic 1000 MG CAPS Take 1,000 mg by mouth daily.        . Ginger, Zingiber officinalis, (GINGER ROOT) 550 MG CAPS Take 550 mg by mouth daily.        [name] levothyroxine (SYNTHROID, LEVOTHROID) 50 MCG tablet Take 50 mcg by mouth daily.        [name] lidocaine-prilocaine (EMLA) cream Apply 1 application topically as needed. For port-a-cath access.      . Multiple Vitamin (MULITIVITAMIN WITH MINERALS) TABS Take 1 tablet by mouth daily.        . nadolol (CORGARD) 20 MG tablet Take 20 mg by mouth daily.        [name] nystatin (MYCOSTATIN) 100000 UNIT/ML suspension Take 500,000 Units by mouth 4 (four) times daily as needed. For thrush.       . predniSONE (DELTASONE) 50 MG tablet Take 1 tablet (50 mg total) by mouth daily. 5 day course of therapy post-chemo treatments.  5 tablet  1  .  simvastatin (ZOCOR) 20 MG tablet Take 7 mg by mouth at bedtime.        . fluconazole (DIFLUCAN) 100 MG tablet 2 tabs by mouth stat, then 1 tab by mouth daily until completed  16 tablet  2   No current facility-administered medications for this visit.   Facility-Administered Medications Ordered in Other Visits  Medication Dose Route Frequency Provider Last Rate Last Dose  . 0.9 %  sodium chloride infusion   Intravenous Once Mohamed K. Mohamed, MD      . pegfilgrastim (NEULASTA) injection 6 mg  6 mg Subcutaneous Once Mohamed K. Mohamed, MD   6 mg at 01/09/11 0825  . riTUXimab (RITUXAN) 600 mg in sodium chloride 0.9 % 190 mL chemo infusion  375 mg/m2 (Treatment Plan Actual) Intravenous Once Mohamed K. Arbutus Ped, MD   600 mg at 01/08/11 1040    SURGICAL HISTORY:  Past Surgical History  Procedure Date  . Nephrectomy     Right Side  .  Tonsillectomy   . Portacath placement     right subclavian    REVIEW OF SYSTEMS:  A comprehensive review of systems was negative except for: Musculoskeletal: positive for Left leg pain has now resolved with studies that were negative for deep vein thrombosis   PHYSICAL EXAMINATION: General appearance: alert, cooperative, appears stated age and no distress Head: Normocephalic, without obvious abnormality, atraumatic Neck: no adenopathy, no carotid bruit, no JVD, supple, symmetrical, trachea midline and thyroid not enlarged, symmetric, no tenderness/mass/nodules Lymph nodes: Cervical, supraclavicular, and axillary nodes normal. Resp: clear to auscultation bilaterally Cardio: regular rate and rhythm, S1, S2 normal, no murmur, click, rub or gallop GI: soft, non-tender; bowel sounds normal; no masses,  no organomegaly Extremities: No point tenderness pitting edema or palpable cords however there remains some mild tenderness erythema, and warmth to the right wrist and base of the right some. Mouth: oropharynx negative for thrush  ECOG PERFORMANCE STATUS: 1 - Symptomatic but completely ambulatory  There were no vitals taken for this visit.  LABORATORY DATA: Lab Results  Component Value Date   WBC 7.0 01/08/2011   HGB 10.0* 01/08/2011   HCT 30.2* 01/08/2011   MCV 104.1* 01/08/2011   PLT 284 01/08/2011      Chemistry      Component Value Date/Time   NA 143 01/08/2011 0815   K 3.7 01/08/2011 0815   CL 107 01/08/2011 0815   CO2 27 01/08/2011 0815   BUN 12 01/08/2011 0815   CREATININE 0.89 01/08/2011 0815      Component Value Date/Time   CALCIUM 9.2 01/08/2011 0815   ALKPHOS 95 01/08/2011 0815   AST 20 01/08/2011 0815   ALT 13 01/08/2011 0815   BILITOT 0.5 01/08/2011 0815       RADIOGRAPHIC STUDIES:  No results found.  ASSESSMENT/PLAN: The patient is a pleasant 78 year old white female with non-Hodgkin's lymphoma status post 4 cycles of CHOP Rituxan. She also has a history of stage III renal cell  carcinoma status post right nephrectomy. Patient was discussed with Dr. Arbutus Ped.  She'll continue with weekly labs as scheduled and return in 3 weeks prior to her next scheduled cycle of systemic chemotherapy with CHOP/Rituxan. She should be need further evaluation in the interim she is to return sooner. We will change her chemotherapy days to Mondays with her next cycle, cycle #6 on 02/03/2011. She will be due for restaging scans after that cycle. A prescription for prednisone was sent via [facility] to her pharmacy of record.  [name], PA-C     All questions were answered. The patient knows to call the clinic with any problems, questions or concerns. We can certainly see the patient much sooner if necessary.

## 2011-01-10 DIAGNOSIS — C649 Malignant neoplasm of unspecified kidney, except renal pelvis: Secondary | ICD-10-CM | POA: Diagnosis not present

## 2011-01-10 DIAGNOSIS — C8589 Other specified types of non-Hodgkin lymphoma, extranodal and solid organ sites: Secondary | ICD-10-CM | POA: Diagnosis not present

## 2011-01-10 DIAGNOSIS — F09 Unspecified mental disorder due to known physiological condition: Secondary | ICD-10-CM | POA: Diagnosis not present

## 2011-01-10 DIAGNOSIS — R5381 Other malaise: Secondary | ICD-10-CM | POA: Diagnosis not present

## 2011-01-10 DIAGNOSIS — R269 Unspecified abnormalities of gait and mobility: Secondary | ICD-10-CM | POA: Diagnosis not present

## 2011-01-11 ENCOUNTER — Ambulatory Visit: Payer: Medicare Other

## 2011-01-13 ENCOUNTER — Telehealth: Payer: Self-pay | Admitting: Internal Medicine

## 2011-01-13 ENCOUNTER — Other Ambulatory Visit (HOSPITAL_BASED_OUTPATIENT_CLINIC_OR_DEPARTMENT_OTHER): Payer: Medicare Other

## 2011-01-13 DIAGNOSIS — D649 Anemia, unspecified: Secondary | ICD-10-CM | POA: Diagnosis not present

## 2011-01-13 DIAGNOSIS — C8330 Diffuse large B-cell lymphoma, unspecified site: Secondary | ICD-10-CM

## 2011-01-13 DIAGNOSIS — C8590 Non-Hodgkin lymphoma, unspecified, unspecified site: Secondary | ICD-10-CM

## 2011-01-13 LAB — CBC WITH DIFFERENTIAL/PLATELET
BASO%: 0.8 % (ref 0.0–2.0)
LYMPH%: 6.1 % — ABNORMAL LOW (ref 14.0–49.7)
MCHC: 33.7 g/dL (ref 31.5–36.0)
MCV: 103.4 fL — ABNORMAL HIGH (ref 79.5–101.0)
MONO#: 0 10*3/uL — ABNORMAL LOW (ref 0.1–0.9)
MONO%: 0.6 % (ref 0.0–14.0)
Platelets: 129 10*3/uL — ABNORMAL LOW (ref 145–400)
RBC: 2.67 10*6/uL — ABNORMAL LOW (ref 3.70–5.45)
WBC: 7.2 10*3/uL (ref 3.9–10.3)
nRBC: 0 % (ref 0–0)

## 2011-01-13 LAB — COMPREHENSIVE METABOLIC PANEL
ALT: 13 U/L (ref 0–35)
BUN: 29 mg/dL — ABNORMAL HIGH (ref 6–23)
CO2: 35 mEq/L — ABNORMAL HIGH (ref 19–32)
Calcium: 9.9 mg/dL (ref 8.4–10.5)
Chloride: 102 mEq/L (ref 96–112)
Creatinine, Ser: 0.89 mg/dL (ref 0.50–1.10)
Glucose, Bld: 79 mg/dL (ref 70–99)
Total Bilirubin: 0.8 mg/dL (ref 0.3–1.2)

## 2011-01-13 NOTE — Telephone Encounter (Signed)
Pt called stating she has weakness in her hands and is " losing the grips'  in her hands since last chemo - twice she has dropped things - she states she specifically has trouble using her thumb and first finger. Denies tingling .

## 2011-01-14 ENCOUNTER — Other Ambulatory Visit: Payer: Self-pay | Admitting: Internal Medicine

## 2011-01-14 ENCOUNTER — Telehealth: Payer: Self-pay | Admitting: Internal Medicine

## 2011-01-14 DIAGNOSIS — F09 Unspecified mental disorder due to known physiological condition: Secondary | ICD-10-CM | POA: Diagnosis not present

## 2011-01-14 DIAGNOSIS — C8589 Other specified types of non-Hodgkin lymphoma, extranodal and solid organ sites: Secondary | ICD-10-CM | POA: Diagnosis not present

## 2011-01-14 DIAGNOSIS — C649 Malignant neoplasm of unspecified kidney, except renal pelvis: Secondary | ICD-10-CM | POA: Diagnosis not present

## 2011-01-14 DIAGNOSIS — R269 Unspecified abnormalities of gait and mobility: Secondary | ICD-10-CM | POA: Diagnosis not present

## 2011-01-14 DIAGNOSIS — R5381 Other malaise: Secondary | ICD-10-CM | POA: Diagnosis not present

## 2011-01-14 NOTE — Telephone Encounter (Addendum)
Pt returned my call and left message with her phone number . I tired to  Call her back but her phone  line is busy I also left message on Alan"s work number to call because pt line is busy.

## 2011-01-14 NOTE — Telephone Encounter (Signed)
Pt contacted about upcoming appts 1/21/and is aware of lab appt at 1400  with f/u to see Adrena . She also knows she is scheduled to have chemo on 1/23. Onc treatment requested sent to scheduler

## 2011-01-15 DIAGNOSIS — R269 Unspecified abnormalities of gait and mobility: Secondary | ICD-10-CM | POA: Diagnosis not present

## 2011-01-15 DIAGNOSIS — C649 Malignant neoplasm of unspecified kidney, except renal pelvis: Secondary | ICD-10-CM | POA: Diagnosis not present

## 2011-01-15 DIAGNOSIS — F09 Unspecified mental disorder due to known physiological condition: Secondary | ICD-10-CM | POA: Diagnosis not present

## 2011-01-15 DIAGNOSIS — R5383 Other fatigue: Secondary | ICD-10-CM | POA: Diagnosis not present

## 2011-01-15 DIAGNOSIS — C8589 Other specified types of non-Hodgkin lymphoma, extranodal and solid organ sites: Secondary | ICD-10-CM | POA: Diagnosis not present

## 2011-01-15 NOTE — Progress Notes (Signed)
This encounter was created in error - please disregard.

## 2011-01-17 DIAGNOSIS — H543 Unqualified visual loss, both eyes: Secondary | ICD-10-CM | POA: Diagnosis not present

## 2011-01-17 DIAGNOSIS — R269 Unspecified abnormalities of gait and mobility: Secondary | ICD-10-CM | POA: Diagnosis not present

## 2011-01-17 DIAGNOSIS — C649 Malignant neoplasm of unspecified kidney, except renal pelvis: Secondary | ICD-10-CM | POA: Diagnosis not present

## 2011-01-17 DIAGNOSIS — F09 Unspecified mental disorder due to known physiological condition: Secondary | ICD-10-CM | POA: Diagnosis not present

## 2011-01-17 DIAGNOSIS — C8589 Other specified types of non-Hodgkin lymphoma, extranodal and solid organ sites: Secondary | ICD-10-CM | POA: Diagnosis not present

## 2011-01-20 ENCOUNTER — Other Ambulatory Visit (HOSPITAL_BASED_OUTPATIENT_CLINIC_OR_DEPARTMENT_OTHER): Payer: Medicare Other | Admitting: Lab

## 2011-01-20 DIAGNOSIS — D649 Anemia, unspecified: Secondary | ICD-10-CM

## 2011-01-20 DIAGNOSIS — C8590 Non-Hodgkin lymphoma, unspecified, unspecified site: Secondary | ICD-10-CM

## 2011-01-20 DIAGNOSIS — C8330 Diffuse large B-cell lymphoma, unspecified site: Secondary | ICD-10-CM

## 2011-01-20 DIAGNOSIS — C8589 Other specified types of non-Hodgkin lymphoma, extranodal and solid organ sites: Secondary | ICD-10-CM

## 2011-01-20 LAB — CBC WITH DIFFERENTIAL/PLATELET
BASO%: 0.5 % (ref 0.0–2.0)
Eosinophils Absolute: 0 10*3/uL (ref 0.0–0.5)
HCT: 26.8 % — ABNORMAL LOW (ref 34.8–46.6)
MCHC: 34.3 g/dL (ref 31.5–36.0)
MONO#: 0.1 10*3/uL (ref 0.1–0.9)
NEUT#: 4 10*3/uL (ref 1.5–6.5)
NEUT%: 84.7 % — ABNORMAL HIGH (ref 38.4–76.8)
WBC: 4.7 10*3/uL (ref 3.9–10.3)
lymph#: 0.6 10*3/uL — ABNORMAL LOW (ref 0.9–3.3)

## 2011-01-20 LAB — COMPREHENSIVE METABOLIC PANEL
ALT: 11 U/L (ref 0–35)
CO2: 31 mEq/L (ref 19–32)
Calcium: 9 mg/dL (ref 8.4–10.5)
Chloride: 103 mEq/L (ref 96–112)
Creatinine, Ser: 0.91 mg/dL (ref 0.50–1.10)
Glucose, Bld: 91 mg/dL (ref 70–99)
Sodium: 141 mEq/L (ref 135–145)
Total Protein: 5.6 g/dL — ABNORMAL LOW (ref 6.0–8.3)

## 2011-01-21 DIAGNOSIS — H543 Unqualified visual loss, both eyes: Secondary | ICD-10-CM | POA: Diagnosis not present

## 2011-01-21 DIAGNOSIS — R269 Unspecified abnormalities of gait and mobility: Secondary | ICD-10-CM | POA: Diagnosis not present

## 2011-01-21 DIAGNOSIS — C8589 Other specified types of non-Hodgkin lymphoma, extranodal and solid organ sites: Secondary | ICD-10-CM | POA: Diagnosis not present

## 2011-01-21 DIAGNOSIS — F09 Unspecified mental disorder due to known physiological condition: Secondary | ICD-10-CM | POA: Diagnosis not present

## 2011-01-21 DIAGNOSIS — C649 Malignant neoplasm of unspecified kidney, except renal pelvis: Secondary | ICD-10-CM | POA: Diagnosis not present

## 2011-01-27 ENCOUNTER — Ambulatory Visit (HOSPITAL_BASED_OUTPATIENT_CLINIC_OR_DEPARTMENT_OTHER): Payer: Medicare Other | Admitting: Physician Assistant

## 2011-01-27 ENCOUNTER — Other Ambulatory Visit: Payer: Medicare Other | Admitting: Lab

## 2011-01-27 ENCOUNTER — Ambulatory Visit: Payer: Medicare Other

## 2011-01-27 DIAGNOSIS — C8589 Other specified types of non-Hodgkin lymphoma, extranodal and solid organ sites: Secondary | ICD-10-CM | POA: Diagnosis not present

## 2011-01-27 DIAGNOSIS — Z79899 Other long term (current) drug therapy: Secondary | ICD-10-CM | POA: Diagnosis not present

## 2011-01-27 DIAGNOSIS — C8590 Non-Hodgkin lymphoma, unspecified, unspecified site: Secondary | ICD-10-CM

## 2011-01-27 DIAGNOSIS — C8330 Diffuse large B-cell lymphoma, unspecified site: Secondary | ICD-10-CM

## 2011-01-27 DIAGNOSIS — Z85528 Personal history of other malignant neoplasm of kidney: Secondary | ICD-10-CM

## 2011-01-27 LAB — CBC WITH DIFFERENTIAL/PLATELET
BASO%: 0.7 % (ref 0.0–2.0)
Eosinophils Absolute: 0 10*3/uL (ref 0.0–0.5)
LYMPH%: 7.6 % — ABNORMAL LOW (ref 14.0–49.7)
MCH: 37.2 pg — ABNORMAL HIGH (ref 25.1–34.0)
MCHC: 33.9 g/dL (ref 31.5–36.0)
MCV: 109.9 fL — ABNORMAL HIGH (ref 79.5–101.0)
MONO%: 4.7 % (ref 0.0–14.0)
Platelets: 247 10*3/uL (ref 145–400)
RBC: 2.53 10*6/uL — ABNORMAL LOW (ref 3.70–5.45)

## 2011-01-27 LAB — COMPREHENSIVE METABOLIC PANEL
ALT: 13 U/L (ref 0–35)
Alkaline Phosphatase: 105 U/L (ref 39–117)
Sodium: 142 mEq/L (ref 135–145)
Total Bilirubin: 0.4 mg/dL (ref 0.3–1.2)
Total Protein: 5.4 g/dL — ABNORMAL LOW (ref 6.0–8.3)

## 2011-01-28 ENCOUNTER — Ambulatory Visit: Payer: Medicare Other

## 2011-01-28 ENCOUNTER — Telehealth: Payer: Self-pay | Admitting: Internal Medicine

## 2011-01-28 DIAGNOSIS — C649 Malignant neoplasm of unspecified kidney, except renal pelvis: Secondary | ICD-10-CM | POA: Diagnosis not present

## 2011-01-28 DIAGNOSIS — C8589 Other specified types of non-Hodgkin lymphoma, extranodal and solid organ sites: Secondary | ICD-10-CM | POA: Diagnosis not present

## 2011-01-28 DIAGNOSIS — R269 Unspecified abnormalities of gait and mobility: Secondary | ICD-10-CM | POA: Diagnosis not present

## 2011-01-28 DIAGNOSIS — H543 Unqualified visual loss, both eyes: Secondary | ICD-10-CM | POA: Diagnosis not present

## 2011-01-28 DIAGNOSIS — F09 Unspecified mental disorder due to known physiological condition: Secondary | ICD-10-CM | POA: Diagnosis not present

## 2011-01-28 NOTE — Progress Notes (Signed)
No images are attached to the encounter. No scans are attached to the encounter. No scans are attached to the encounter. [facility] OFFICE PROGRESS NOTE  Marcine Matar M.D. Mila Palmer M.D.  DIAGNOSIS: #1 non-Hodgkin's lymphoma diagnosed on bone marrow biopsy in June of 2012 #2 stage III (T3a N0M0) renal cell carcinoma diagnosed in June of 2012  PRIOR THERAPY: Status post right nephrectomy under the care of Dr. Retta Diones on 09/02/2010  CURRENT THERAPY: Systemic chemotherapy with CHOP/Rituxan given every 3 weeks, status post 5 cycles  INTERVAL HISTORY: Melissa Cross 78 y.o. female returns for a scheduled regular three-week visit for followup of her non-Hodgkin's lymphoma. She is tolerating the prednisone and 50 mg however develops recurrent thrush. She manages the thrush symptoms well with nystatin and diflucan. Her nadolol has been increased to 40 mg daily. She is accompanied by her other son Fayrene Fearing. She does report hand weakness more so in her right hand in her left. Does affect some fine motor coordination such as Alimta zipper or buttoning her clothes. Writing her name, she has to slowdown in order for it to be legible. She is using a small piece of rise shelf minor in order to grasp things. She finds this helpful. She states that she does have to have a repeat bone marrow biopsy she would like "twilight sedation".    MEDICAL HISTORY: Past Medical History  Diagnosis Date  . Renal cell carcinoma   . Hypertension   . Hypercholesteremia   . Non Hodgkin's lymphoma   . Glaucoma   . Arthritis   . Anemia   . Stroke     hx of TIA  . Blood transfusion   . Hypothyroidism   . Hypothyroid     on synthroid   . Cancer     kidney cancer, NHL   . Cancer     last chemo 11/04/10   . Thrush 12/17/2010    ALLERGIES:  is allergic to dilaudid.  MEDICATIONS:  Current Outpatient Prescriptions  Medication Sig Dispense Refill  . allopurinol (ZYLOPRIM) 100 MG tablet Take 1  tablet (100 mg total) by mouth 2 (two) times daily.  60 tablet  1  . aspirin 81 MG tablet Take 81 mg by mouth daily.       . Boost Plus (BOOST PLUS) LIQD Take 237 mLs by mouth 2 (two) times daily as needed. For nutrition.       . cholecalciferol (VITAMIN D) 400 UNITS TABS Take 400 Units by mouth daily.        . fluconazole (DIFLUCAN) 100 MG tablet 2 tabs by mouth stat, then 1 tab by mouth daily until completed  16 tablet  2  . Garlic 1000 MG CAPS Take 1,000 mg by mouth daily.        . Ginger, Zingiber officinalis, (GINGER ROOT) 550 MG CAPS Take 550 mg by mouth daily.        [name] levothyroxine (SYNTHROID, LEVOTHROID) 50 MCG tablet Take 50 mcg by mouth daily.        [name] lidocaine-prilocaine (EMLA) cream Apply 1 application topically as needed. For port-a-cath access.      . Multiple Vitamin (MULITIVITAMIN WITH MINERALS) TABS Take 1 tablet by mouth daily.        . nadolol (CORGARD) 20 MG tablet Take 40 mg by mouth daily.       [name] nystatin (MYCOSTATIN) 100000 UNIT/ML suspension Take 500,000 Units by mouth 4 (four) times daily as needed. For thrush.       [name]  predniSONE (DELTASONE) 50 MG tablet Take 1 tablet (50 mg total) by mouth daily. 5 day course of therapy post-chemo treatments.  5 tablet  1  . simvastatin (ZOCOR) 20 MG tablet Take 7 mg by mouth at bedtime.          SURGICAL HISTORY:  Past Surgical History  Procedure Date  . Nephrectomy     Right Side  . Tonsillectomy   . Portacath placement     right subclavian    REVIEW OF SYSTEMS:  A comprehensive review of systems was negative except for: Constitutional: positive for weight loss Gastrointestinal: positive for Increased gas Musculoskeletal: positive for Bilateral hand weakness right more so than the left affecting fine motor coordination   PHYSICAL EXAMINATION: General appearance: alert, cooperative, appears stated age and no distress Head: Normocephalic, without obvious abnormality, atraumatic Neck: no adenopathy, no carotid bruit, no  JVD, supple, symmetrical, trachea midline and thyroid not enlarged, symmetric, no tenderness/mass/nodules Lymph nodes: Cervical, supraclavicular, and axillary nodes normal. Resp: clear to auscultation bilaterally Cardio: regular rate and rhythm, S1, S2 normal, no murmur, click, rub or gallop GI: soft, non-tender; bowel sounds normal; no masses,  no organomegaly Extremities: No point tenderness pitting edema or palpable cords however there remains some mild tenderness erythema, and warmth to the right wrist and base of the right some. Mouth: oropharynx negative for thrush  ECOG PERFORMANCE STATUS: 1 - Symptomatic but completely ambulatory  Blood pressure 152/75, pulse 71, temperature 97.4 F (36.3 C), temperature source Oral, height 5\' 3"  (1.6 m), weight 112 lb 3.2 oz (50.894 kg).  LABORATORY DATA: Lab Results  Component Value Date   WBC 8.6 01/27/2011   HGB 9.4* 01/27/2011   HCT 27.8* 01/27/2011   MCV 109.9* 01/27/2011   PLT 247 01/27/2011      Chemistry      Component Value Date/Time   NA 142 01/27/2011 1356   K 4.0 01/27/2011 1356   CL 103 01/27/2011 1356   CO2 29 01/27/2011 1356   BUN 15 01/27/2011 1356   CREATININE 0.86 01/27/2011 1356      Component Value Date/Time   CALCIUM 9.7 01/27/2011 1356   ALKPHOS 105 01/27/2011 1356   AST 20 01/27/2011 1356   ALT 13 01/27/2011 1356   BILITOT 0.4 01/27/2011 1356       RADIOGRAPHIC STUDIES:  No results found.  ASSESSMENT/PLAN: The patient is a pleasant 78 year old white female with non-Hodgkin's lymphoma status post 5 cycles of CHOP/ Rituxan. She also has a history of stage III renal cell carcinoma status post right nephrectomy. Patient was discussed with Dr. Arbutus Ped. She will proceed with cycle #6 of her systemic chemotherapy with CHOP/Rituxan. She'll continue with weekly labs as scheduled and return in 3 weeks with a repeat CBC differential C. met, LDH and a CT of the chest abdomen and pelvis without contrast reevaluate her disease. She  will see Dr. Arbutus Ped at this visit and he will discuss the repeat bone marrow biopsy scheduling at that visit.   [name], PA-C     All questions were answered. The patient knows to call the clinic with any problems, questions or concerns. We can certainly see the patient much sooner if necessary.

## 2011-01-28 NOTE — Telephone Encounter (Signed)
pt called with 2/8 ct and 2/11 md visit,pt to stop by 1/24 for sch and contrast     aom

## 2011-01-29 ENCOUNTER — Ambulatory Visit (HOSPITAL_BASED_OUTPATIENT_CLINIC_OR_DEPARTMENT_OTHER): Payer: Medicare Other

## 2011-01-29 ENCOUNTER — Other Ambulatory Visit: Payer: Self-pay | Admitting: Internal Medicine

## 2011-01-29 DIAGNOSIS — Z5112 Encounter for antineoplastic immunotherapy: Secondary | ICD-10-CM | POA: Diagnosis not present

## 2011-01-29 DIAGNOSIS — Z5111 Encounter for antineoplastic chemotherapy: Secondary | ICD-10-CM | POA: Diagnosis not present

## 2011-01-29 DIAGNOSIS — C8589 Other specified types of non-Hodgkin lymphoma, extranodal and solid organ sites: Secondary | ICD-10-CM

## 2011-01-29 DIAGNOSIS — C8330 Diffuse large B-cell lymphoma, unspecified site: Secondary | ICD-10-CM

## 2011-01-29 DIAGNOSIS — C8590 Non-Hodgkin lymphoma, unspecified, unspecified site: Secondary | ICD-10-CM

## 2011-01-29 MED ORDER — SODIUM CHLORIDE 0.9 % IV SOLN
375.0000 mg/m2 | Freq: Once | INTRAVENOUS | Status: AC
  Administered 2011-01-29: 600 mg via INTRAVENOUS
  Filled 2011-01-29: qty 60

## 2011-01-29 MED ORDER — ONDANSETRON 16 MG/50ML IVPB (CHCC)
16.0000 mg | Freq: Once | INTRAVENOUS | Status: AC
  Administered 2011-01-29: 16 mg via INTRAVENOUS

## 2011-01-29 MED ORDER — SODIUM CHLORIDE 0.9 % IJ SOLN
10.0000 mL | INTRAMUSCULAR | Status: DC | PRN
  Administered 2011-01-29: 10 mL
  Filled 2011-01-29: qty 10

## 2011-01-29 MED ORDER — DEXAMETHASONE SODIUM PHOSPHATE 4 MG/ML IJ SOLN
20.0000 mg | Freq: Once | INTRAMUSCULAR | Status: AC
  Administered 2011-01-29: 20 mg via INTRAVENOUS

## 2011-01-29 MED ORDER — SODIUM CHLORIDE 0.9 % IV SOLN
Freq: Once | INTRAVENOUS | Status: AC
  Administered 2011-01-29: 09:00:00 via INTRAVENOUS

## 2011-01-29 MED ORDER — HEPARIN SOD (PORK) LOCK FLUSH 100 UNIT/ML IV SOLN
500.0000 [IU] | Freq: Once | INTRAVENOUS | Status: AC | PRN
  Administered 2011-01-29: 500 [IU]
  Filled 2011-01-29: qty 5

## 2011-01-29 MED ORDER — CYCLOPHOSPHAMIDE CHEMO INJECTION 1 GM
750.0000 mg/m2 | Freq: Once | INTRAMUSCULAR | Status: AC
  Administered 2011-01-29: 1140 mg via INTRAVENOUS
  Filled 2011-01-29: qty 57

## 2011-01-29 MED ORDER — ACETAMINOPHEN 325 MG PO TABS
650.0000 mg | ORAL_TABLET | Freq: Once | ORAL | Status: AC
  Administered 2011-01-29: 650 mg via ORAL

## 2011-01-29 MED ORDER — DIPHENHYDRAMINE HCL 25 MG PO CAPS
50.0000 mg | ORAL_CAPSULE | Freq: Once | ORAL | Status: AC
  Administered 2011-01-29: 50 mg via ORAL

## 2011-01-29 MED ORDER — DOXORUBICIN HCL CHEMO IV INJECTION 2 MG/ML
50.0000 mg/m2 | Freq: Once | INTRAVENOUS | Status: AC
  Administered 2011-01-29: 76 mg via INTRAVENOUS
  Filled 2011-01-29: qty 38

## 2011-01-29 MED ORDER — VINCRISTINE SULFATE CHEMO INJECTION 1 MG/ML
2.0000 mg | Freq: Once | INTRAVENOUS | Status: AC
  Administered 2011-01-29: 2 mg via INTRAVENOUS
  Filled 2011-01-29: qty 2

## 2011-01-30 ENCOUNTER — Ambulatory Visit (HOSPITAL_BASED_OUTPATIENT_CLINIC_OR_DEPARTMENT_OTHER): Payer: Medicare Other

## 2011-01-30 DIAGNOSIS — Z5189 Encounter for other specified aftercare: Secondary | ICD-10-CM

## 2011-01-30 DIAGNOSIS — C8589 Other specified types of non-Hodgkin lymphoma, extranodal and solid organ sites: Secondary | ICD-10-CM

## 2011-01-30 DIAGNOSIS — C8330 Diffuse large B-cell lymphoma, unspecified site: Secondary | ICD-10-CM

## 2011-01-30 DIAGNOSIS — C8590 Non-Hodgkin lymphoma, unspecified, unspecified site: Secondary | ICD-10-CM

## 2011-01-30 MED ORDER — PEGFILGRASTIM INJECTION 6 MG/0.6ML
6.0000 mg | Freq: Once | SUBCUTANEOUS | Status: AC
  Administered 2011-01-30: 6 mg via SUBCUTANEOUS
  Filled 2011-01-30: qty 0.6

## 2011-01-31 ENCOUNTER — Telehealth: Payer: Self-pay | Admitting: Internal Medicine

## 2011-01-31 DIAGNOSIS — R300 Dysuria: Secondary | ICD-10-CM

## 2011-01-31 MED ORDER — CIPROFLOXACIN HCL 500 MG PO TABS: 500.0000 mg | ORAL_TABLET | Freq: Two times a day (BID) | ORAL | Status: AC

## 2011-01-31 NOTE — Telephone Encounter (Signed)
Called to report she started cipro  at 0100 for severe  dysuria. ( She had 4 days worth of cipro from a previous rx) for something else. She had chemo on wed. She would like another rx for cipro to complete the treatment. . I told her I will consult with Adrena

## 2011-01-31 NOTE — Telephone Encounter (Signed)
Called in cipro 500mg  po bid x 4 days to [facility] and pt notified.

## 2011-02-03 ENCOUNTER — Other Ambulatory Visit: Payer: Self-pay | Admitting: *Deleted

## 2011-02-03 ENCOUNTER — Ambulatory Visit: Payer: Medicare Other

## 2011-02-03 ENCOUNTER — Other Ambulatory Visit: Payer: Medicare Other | Admitting: Lab

## 2011-02-03 ENCOUNTER — Other Ambulatory Visit (HOSPITAL_BASED_OUTPATIENT_CLINIC_OR_DEPARTMENT_OTHER): Payer: Medicare Other | Admitting: Lab

## 2011-02-03 DIAGNOSIS — C8330 Diffuse large B-cell lymphoma, unspecified site: Secondary | ICD-10-CM

## 2011-02-03 DIAGNOSIS — C649 Malignant neoplasm of unspecified kidney, except renal pelvis: Secondary | ICD-10-CM | POA: Diagnosis not present

## 2011-02-03 DIAGNOSIS — F09 Unspecified mental disorder due to known physiological condition: Secondary | ICD-10-CM | POA: Diagnosis not present

## 2011-02-03 DIAGNOSIS — C8589 Other specified types of non-Hodgkin lymphoma, extranodal and solid organ sites: Secondary | ICD-10-CM

## 2011-02-03 DIAGNOSIS — C8590 Non-Hodgkin lymphoma, unspecified, unspecified site: Secondary | ICD-10-CM

## 2011-02-03 DIAGNOSIS — H543 Unqualified visual loss, both eyes: Secondary | ICD-10-CM | POA: Diagnosis not present

## 2011-02-03 DIAGNOSIS — R269 Unspecified abnormalities of gait and mobility: Secondary | ICD-10-CM | POA: Diagnosis not present

## 2011-02-03 LAB — COMPREHENSIVE METABOLIC PANEL
AST: 15 U/L (ref 0–37)
Albumin: 4 g/dL (ref 3.5–5.2)
Alkaline Phosphatase: 95 U/L (ref 39–117)
BUN: 27 mg/dL — ABNORMAL HIGH (ref 6–23)
Glucose, Bld: 92 mg/dL (ref 70–99)
Potassium: 3.3 mEq/L — ABNORMAL LOW (ref 3.5–5.3)
Sodium: 138 mEq/L (ref 135–145)
Total Bilirubin: 0.6 mg/dL (ref 0.3–1.2)

## 2011-02-03 LAB — CBC WITH DIFFERENTIAL/PLATELET
Basophils Absolute: 0 10*3/uL (ref 0.0–0.1)
EOS%: 0.2 % (ref 0.0–7.0)
Eosinophils Absolute: 0 10*3/uL (ref 0.0–0.5)
LYMPH%: 6.5 % — ABNORMAL LOW (ref 14.0–49.7)
MCH: 37.6 pg — ABNORMAL HIGH (ref 25.1–34.0)
MCV: 110 fL — ABNORMAL HIGH (ref 79.5–101.0)
MONO%: 0.3 % (ref 0.0–14.0)
Platelets: 103 10*3/uL — ABNORMAL LOW (ref 145–400)
RBC: 2.52 10*6/uL — ABNORMAL LOW (ref 3.70–5.45)
RDW: 18.3 % — ABNORMAL HIGH (ref 11.2–14.5)

## 2011-02-04 DIAGNOSIS — H543 Unqualified visual loss, both eyes: Secondary | ICD-10-CM | POA: Diagnosis not present

## 2011-02-04 DIAGNOSIS — F09 Unspecified mental disorder due to known physiological condition: Secondary | ICD-10-CM | POA: Diagnosis not present

## 2011-02-04 DIAGNOSIS — R269 Unspecified abnormalities of gait and mobility: Secondary | ICD-10-CM | POA: Diagnosis not present

## 2011-02-04 DIAGNOSIS — C649 Malignant neoplasm of unspecified kidney, except renal pelvis: Secondary | ICD-10-CM | POA: Diagnosis not present

## 2011-02-04 DIAGNOSIS — C8589 Other specified types of non-Hodgkin lymphoma, extranodal and solid organ sites: Secondary | ICD-10-CM | POA: Diagnosis not present

## 2011-02-05 ENCOUNTER — Telehealth: Payer: Self-pay | Admitting: Internal Medicine

## 2011-02-05 NOTE — Telephone Encounter (Signed)
called pt and lab was moved to 2/4 and earlier  aom

## 2011-02-06 ENCOUNTER — Telehealth: Payer: Self-pay | Admitting: Internal Medicine

## 2011-02-06 DIAGNOSIS — R269 Unspecified abnormalities of gait and mobility: Secondary | ICD-10-CM | POA: Diagnosis not present

## 2011-02-06 DIAGNOSIS — C8589 Other specified types of non-Hodgkin lymphoma, extranodal and solid organ sites: Secondary | ICD-10-CM | POA: Diagnosis not present

## 2011-02-06 DIAGNOSIS — H543 Unqualified visual loss, both eyes: Secondary | ICD-10-CM | POA: Diagnosis not present

## 2011-02-06 DIAGNOSIS — C649 Malignant neoplasm of unspecified kidney, except renal pelvis: Secondary | ICD-10-CM | POA: Diagnosis not present

## 2011-02-06 DIAGNOSIS — F09 Unspecified mental disorder due to known physiological condition: Secondary | ICD-10-CM | POA: Diagnosis not present

## 2011-02-06 NOTE — Telephone Encounter (Signed)
Notified pt to increase dietary potassium. Pt voices understanding of examples of  foods to increase.

## 2011-02-06 NOTE — Telephone Encounter (Signed)
Confirmed lab and f/u appointments

## 2011-02-10 ENCOUNTER — Other Ambulatory Visit: Payer: Medicare Other | Admitting: Lab

## 2011-02-10 ENCOUNTER — Other Ambulatory Visit (HOSPITAL_BASED_OUTPATIENT_CLINIC_OR_DEPARTMENT_OTHER): Payer: Medicare Other | Admitting: Lab

## 2011-02-10 ENCOUNTER — Ambulatory Visit: Payer: Medicare Other | Admitting: Internal Medicine

## 2011-02-10 DIAGNOSIS — R269 Unspecified abnormalities of gait and mobility: Secondary | ICD-10-CM | POA: Diagnosis not present

## 2011-02-10 DIAGNOSIS — H543 Unqualified visual loss, both eyes: Secondary | ICD-10-CM | POA: Diagnosis not present

## 2011-02-10 DIAGNOSIS — F09 Unspecified mental disorder due to known physiological condition: Secondary | ICD-10-CM | POA: Diagnosis not present

## 2011-02-10 DIAGNOSIS — C8589 Other specified types of non-Hodgkin lymphoma, extranodal and solid organ sites: Secondary | ICD-10-CM

## 2011-02-10 DIAGNOSIS — C8330 Diffuse large B-cell lymphoma, unspecified site: Secondary | ICD-10-CM

## 2011-02-10 DIAGNOSIS — C649 Malignant neoplasm of unspecified kidney, except renal pelvis: Secondary | ICD-10-CM | POA: Diagnosis not present

## 2011-02-10 DIAGNOSIS — D649 Anemia, unspecified: Secondary | ICD-10-CM

## 2011-02-10 LAB — COMPREHENSIVE METABOLIC PANEL
Albumin: 3.9 g/dL (ref 3.5–5.2)
Alkaline Phosphatase: 83 U/L (ref 39–117)
BUN: 10 mg/dL (ref 6–23)
CO2: 30 mEq/L (ref 19–32)
Glucose, Bld: 86 mg/dL (ref 70–99)
Potassium: 4.1 mEq/L (ref 3.5–5.3)
Sodium: 141 mEq/L (ref 135–145)
Total Bilirubin: 0.3 mg/dL (ref 0.3–1.2)
Total Protein: 5.3 g/dL — ABNORMAL LOW (ref 6.0–8.3)

## 2011-02-10 LAB — CBC WITH DIFFERENTIAL/PLATELET
Basophils Absolute: 0 10*3/uL (ref 0.0–0.1)
Eosinophils Absolute: 0 10*3/uL (ref 0.0–0.5)
HGB: 8.6 g/dL — ABNORMAL LOW (ref 11.6–15.9)
MCV: 109.9 fL — ABNORMAL HIGH (ref 79.5–101.0)
MONO#: 0 10*3/uL — ABNORMAL LOW (ref 0.1–0.9)
MONO%: 1 % (ref 0.0–14.0)
NEUT#: 4.3 10*3/uL (ref 1.5–6.5)
RBC: 2.27 10*6/uL — ABNORMAL LOW (ref 3.70–5.45)
RDW: 16.8 % — ABNORMAL HIGH (ref 11.2–14.5)
WBC: 4.9 10*3/uL (ref 3.9–10.3)

## 2011-02-11 ENCOUNTER — Other Ambulatory Visit: Payer: Self-pay | Admitting: Internal Medicine

## 2011-02-11 ENCOUNTER — Telehealth: Payer: Self-pay | Admitting: Internal Medicine

## 2011-02-11 DIAGNOSIS — H543 Unqualified visual loss, both eyes: Secondary | ICD-10-CM | POA: Diagnosis not present

## 2011-02-11 DIAGNOSIS — F09 Unspecified mental disorder due to known physiological condition: Secondary | ICD-10-CM | POA: Diagnosis not present

## 2011-02-11 DIAGNOSIS — C649 Malignant neoplasm of unspecified kidney, except renal pelvis: Secondary | ICD-10-CM | POA: Diagnosis not present

## 2011-02-11 DIAGNOSIS — C8589 Other specified types of non-Hodgkin lymphoma, extranodal and solid organ sites: Secondary | ICD-10-CM | POA: Diagnosis not present

## 2011-02-11 DIAGNOSIS — C8590 Non-Hodgkin lymphoma, unspecified, unspecified site: Secondary | ICD-10-CM

## 2011-02-11 DIAGNOSIS — R269 Unspecified abnormalities of gait and mobility: Secondary | ICD-10-CM | POA: Diagnosis not present

## 2011-02-11 NOTE — Telephone Encounter (Signed)
Asking if she needs labs at next visit. Told pt to come at 3p for labs on 2/11 voices understanding.I sent onc scheduling request

## 2011-02-11 NOTE — Telephone Encounter (Signed)
Lab added 2/11 at 3 per mkm nurse from 2/5 pof,pt also aware per nurse  aom

## 2011-02-12 DIAGNOSIS — C8589 Other specified types of non-Hodgkin lymphoma, extranodal and solid organ sites: Secondary | ICD-10-CM | POA: Diagnosis not present

## 2011-02-12 DIAGNOSIS — R269 Unspecified abnormalities of gait and mobility: Secondary | ICD-10-CM | POA: Diagnosis not present

## 2011-02-12 DIAGNOSIS — F09 Unspecified mental disorder due to known physiological condition: Secondary | ICD-10-CM | POA: Diagnosis not present

## 2011-02-12 DIAGNOSIS — C649 Malignant neoplasm of unspecified kidney, except renal pelvis: Secondary | ICD-10-CM | POA: Diagnosis not present

## 2011-02-12 DIAGNOSIS — H543 Unqualified visual loss, both eyes: Secondary | ICD-10-CM | POA: Diagnosis not present

## 2011-02-14 ENCOUNTER — Ambulatory Visit (HOSPITAL_COMMUNITY)
Admission: RE | Admit: 2011-02-14 | Discharge: 2011-02-14 | Disposition: A | Discharge status: Home / Self Care | Payer: Medicare Other | Source: Ambulatory Visit | Attending: Physician Assistant | Admitting: Physician Assistant

## 2011-02-14 ENCOUNTER — Other Ambulatory Visit: Payer: Medicare Other | Admitting: Lab

## 2011-02-14 ENCOUNTER — Other Ambulatory Visit: Payer: Self-pay | Admitting: *Deleted

## 2011-02-14 DIAGNOSIS — Z79899 Other long term (current) drug therapy: Secondary | ICD-10-CM | POA: Insufficient documentation

## 2011-02-14 DIAGNOSIS — K5730 Diverticulosis of large intestine without perforation or abscess without bleeding: Secondary | ICD-10-CM | POA: Insufficient documentation

## 2011-02-14 DIAGNOSIS — Z905 Acquired absence of kidney: Secondary | ICD-10-CM | POA: Insufficient documentation

## 2011-02-14 DIAGNOSIS — C8589 Other specified types of non-Hodgkin lymphoma, extranodal and solid organ sites: Secondary | ICD-10-CM | POA: Insufficient documentation

## 2011-02-14 DIAGNOSIS — I2510 Atherosclerotic heart disease of native coronary artery without angina pectoris: Secondary | ICD-10-CM | POA: Diagnosis not present

## 2011-02-14 DIAGNOSIS — I77819 Aortic ectasia, unspecified site: Secondary | ICD-10-CM | POA: Insufficient documentation

## 2011-02-14 DIAGNOSIS — C8590 Non-Hodgkin lymphoma, unspecified, unspecified site: Secondary | ICD-10-CM

## 2011-02-14 DIAGNOSIS — Z85528 Personal history of other malignant neoplasm of kidney: Secondary | ICD-10-CM | POA: Diagnosis not present

## 2011-02-14 DIAGNOSIS — M51379 Other intervertebral disc degeneration, lumbosacral region without mention of lumbar back pain or lower extremity pain: Secondary | ICD-10-CM | POA: Insufficient documentation

## 2011-02-14 DIAGNOSIS — K7689 Other specified diseases of liver: Secondary | ICD-10-CM | POA: Diagnosis not present

## 2011-02-14 DIAGNOSIS — N83209 Unspecified ovarian cyst, unspecified side: Secondary | ICD-10-CM | POA: Insufficient documentation

## 2011-02-14 DIAGNOSIS — M47817 Spondylosis without myelopathy or radiculopathy, lumbosacral region: Secondary | ICD-10-CM | POA: Insufficient documentation

## 2011-02-14 DIAGNOSIS — M4120 Other idiopathic scoliosis, site unspecified: Secondary | ICD-10-CM | POA: Insufficient documentation

## 2011-02-14 DIAGNOSIS — M5126 Other intervertebral disc displacement, lumbar region: Secondary | ICD-10-CM | POA: Insufficient documentation

## 2011-02-14 DIAGNOSIS — C8330 Diffuse large B-cell lymphoma, unspecified site: Secondary | ICD-10-CM

## 2011-02-14 DIAGNOSIS — M5137 Other intervertebral disc degeneration, lumbosacral region: Secondary | ICD-10-CM | POA: Diagnosis not present

## 2011-02-14 MED ORDER — ALLOPURINOL 100 MG PO TABS: 100.0000 mg | ORAL_TABLET | Freq: Two times a day (BID) | ORAL | Status: DC

## 2011-02-17 ENCOUNTER — Other Ambulatory Visit: Payer: Medicare Other | Admitting: Lab

## 2011-02-17 ENCOUNTER — Other Ambulatory Visit (HOSPITAL_BASED_OUTPATIENT_CLINIC_OR_DEPARTMENT_OTHER): Payer: Medicare Other | Admitting: Lab

## 2011-02-17 ENCOUNTER — Ambulatory Visit: Payer: Medicare Other | Admitting: Internal Medicine

## 2011-02-17 ENCOUNTER — Ambulatory Visit (HOSPITAL_BASED_OUTPATIENT_CLINIC_OR_DEPARTMENT_OTHER): Payer: Medicare Other | Admitting: Internal Medicine

## 2011-02-17 VITALS — BP 151/77 | HR 68 | Temp 97.6°F | Ht 63.0 in | Wt 112.0 lb

## 2011-02-17 DIAGNOSIS — C8590 Non-Hodgkin lymphoma, unspecified, unspecified site: Secondary | ICD-10-CM

## 2011-02-17 DIAGNOSIS — C8589 Other specified types of non-Hodgkin lymphoma, extranodal and solid organ sites: Secondary | ICD-10-CM | POA: Diagnosis not present

## 2011-02-17 DIAGNOSIS — C8330 Diffuse large B-cell lymphoma, unspecified site: Secondary | ICD-10-CM

## 2011-02-17 LAB — COMPREHENSIVE METABOLIC PANEL
AST: 20 U/L (ref 0–37)
Albumin: 3.6 g/dL (ref 3.5–5.2)
Alkaline Phosphatase: 111 U/L (ref 39–117)
BUN: 14 mg/dL (ref 6–23)
Creatinine, Ser: 0.85 mg/dL (ref 0.50–1.10)
Glucose, Bld: 114 mg/dL — ABNORMAL HIGH (ref 70–99)
Potassium: 3.6 mEq/L (ref 3.5–5.3)

## 2011-02-17 LAB — CBC WITH DIFFERENTIAL/PLATELET
Basophils Absolute: 0 10*3/uL (ref 0.0–0.1)
Eosinophils Absolute: 0 10*3/uL (ref 0.0–0.5)
HCT: 27.4 % — ABNORMAL LOW (ref 34.8–46.6)
HGB: 9.1 g/dL — ABNORMAL LOW (ref 11.6–15.9)
LYMPH%: 8.2 % — ABNORMAL LOW (ref 14.0–49.7)
MCV: 112.4 fL — ABNORMAL HIGH (ref 79.5–101.0)
MONO#: 0.4 10*3/uL (ref 0.1–0.9)
MONO%: 5.7 % (ref 0.0–14.0)
NEUT#: 6.6 10*3/uL — ABNORMAL HIGH (ref 1.5–6.5)
NEUT%: 85.3 % — ABNORMAL HIGH (ref 38.4–76.8)
Platelets: 233 10*3/uL (ref 145–400)
RBC: 2.43 10*6/uL — ABNORMAL LOW (ref 3.70–5.45)
WBC: 7.8 10*3/uL (ref 3.9–10.3)

## 2011-02-17 NOTE — Progress Notes (Signed)
[facility] OFFICE PROGRESS NOTE  Melissa Reeve, MD, MD [address] [place] [address]  DIAGNOSIS: #1 non-Hodgkin's lymphoma diagnosed on bone marrow biopsy in June of 2012  #2 stage III (T3a N0M0) renal cell carcinoma diagnosed in June of 2012   PRIOR THERAPY: Status post right nephrectomy under the care of Dr. Retta Diones on 09/02/2010   CURRENT THERAPY: Systemic chemotherapy with CHOP/Rituxan given every 3 weeks, status post 6 cycles.   INTERVAL HISTORY: Melissa Cross 78 y.o. female returns to the clinic today for followup visit accompanied her son, [name]. The patient tolerated the last cycle of her systemic chemotherapy with CHOP/Rituxan fairly well with no significant complaints except for mild fatigue secondary to chemotherapy-induced anemia. She denied having any significant chest pain but has shortness of breath with exertion, no cough or hemoptysis. No significant weight loss or night sweats. The patient has repeat CT scan of the chest, abdomen and pelvis performed recently and she is here today for evaluation and discussion of her scan results.  MEDICAL HISTORY: Past Medical History  Diagnosis Date  . Renal cell carcinoma   . Hypertension   . Hypercholesteremia   . Non Hodgkin's lymphoma   . Glaucoma   . Arthritis   . Anemia   . Stroke     hx of TIA  . Blood transfusion   . Hypothyroidism   . Hypothyroid     on synthroid   . Cancer     kidney cancer, NHL   . Cancer     last chemo 11/04/10   . Thrush 12/17/2010    ALLERGIES:  is allergic to dilaudid.  MEDICATIONS:  Current Outpatient Prescriptions  Medication Sig Dispense Refill  . allopurinol (ZYLOPRIM) 100 MG tablet Take 1 tablet (100 mg total) by mouth 2 (two) times daily.  60 tablet  0  . aspirin 81 MG tablet Take 81 mg by mouth daily.       . Boost Plus (BOOST PLUS) LIQD Take 237 mLs by mouth 2 (two) times daily as needed. For nutrition.       . cholecalciferol  (VITAMIN D) 400 UNITS TABS Take 400 Units by mouth daily.        . Garlic 1000 MG CAPS Take 1,000 mg by mouth daily.        . Ginger, Zingiber officinalis, (GINGER ROOT) 550 MG CAPS Take 550 mg by mouth daily.        [name] levothyroxine (SYNTHROID, LEVOTHROID) 50 MCG tablet Take 50 mcg by mouth daily.        [name] lidocaine-prilocaine (EMLA) cream Apply 1 application topically as needed. For port-a-cath access.      . Multiple Vitamin (MULITIVITAMIN WITH MINERALS) TABS Take 1 tablet by mouth daily.        . nadolol (CORGARD) 20 MG tablet Take 40 mg by mouth daily.       [name] nystatin (MYCOSTATIN) 100000 UNIT/ML suspension Take 500,000 Units by mouth 4 (four) times daily as needed. For thrush.       . predniSONE (DELTASONE) 50 MG tablet Take 1 tablet (50 mg total) by mouth daily. 5 day course of therapy post-chemo treatments.  5 tablet  1  . simvastatin (ZOCOR) 20 MG tablet Take 7 mg by mouth at bedtime.        . fluconazole (DIFLUCAN) 100 MG tablet 2 tabs by mouth stat, then 1 tab by mouth daily until completed  16 tablet  2    SURGICAL HISTORY:  Past Surgical History  Procedure Date  . Nephrectomy     Right Side  . Tonsillectomy   . Portacath placement     right subclavian    REVIEW OF SYSTEMS:  A comprehensive review of systems was negative except for: Constitutional: positive for fatigue Respiratory: positive for dyspnea on exertion   PHYSICAL EXAMINATION: General appearance: alert, cooperative and no distress Neck: no adenopathy Lymph nodes: Cervical, supraclavicular, and axillary nodes normal. Resp: clear to auscultation bilaterally Cardio: regular rate and rhythm, S1, S2 normal, no murmur, click, rub or gallop GI: soft, non-tender; bowel sounds normal; no masses,  no organomegaly Extremities: extremities normal, atraumatic, no cyanosis or edema Neurologic: Alert and oriented X 3, normal strength and tone. Normal symmetric reflexes. Normal coordination and gait  ECOG PERFORMANCE STATUS:  1 - Symptomatic but completely ambulatory  Blood pressure 151/77, pulse 68, temperature 97.6 F (36.4 C), temperature source Oral, height 5\' 3"  (1.6 m), weight 112 lb (50.803 kg).  LABORATORY DATA: Lab Results  Component Value Date   WBC 7.8 02/17/2011   HGB 9.1* 02/17/2011   HCT 27.4* 02/17/2011   MCV 112.4* 02/17/2011   PLT 233 02/17/2011      Chemistry      Component Value Date/Time   NA 140 02/17/2011 1500   K 3.6 02/17/2011 1500   CL 103 02/17/2011 1500   CO2 30 02/17/2011 1500   BUN 14 02/17/2011 1500   CREATININE 0.85 02/17/2011 1500      Component Value Date/Time   CALCIUM 9.8 02/17/2011 1500   ALKPHOS 111 02/17/2011 1500   AST 20 02/17/2011 1500   ALT 12 02/17/2011 1500   BILITOT 0.3 02/17/2011 1500       RADIOGRAPHIC STUDIES: Ct Chest Wo Contrast  02/14/2011  *RADIOLOGY REPORT*  Clinical Data:  Non-Hodgkins lymphoma.  Chemotherapy in progress. History renal cell carcinoma.  CT CHEST, ABDOMEN AND PELVIS WITHOUT CONTRAST  Technique:  Multidetector CT imaging of the chest, abdomen and pelvis was performed following the standard protocol without IV contrast.  Comparison:  12/13/2010   CT CHEST  Findings:  Ectatic thoracic aorta measures approximately 4 cm in its ascending portion.  Coronary artery atherosclerotic calcification noted.  No pathologic thoracic adenopathy noted.  The lungs appear clear.  IMPRESSION:  1.  Stable appearance of the chest without findings of thoracic malignancy.    CT ABDOMEN AND PELVIS  Findings:  Stable 1.5 x 0.7 cm hypodense lesion in the right hepatic lobe on image 55 of series 2, likely a cyst.  Small hypodense lesion anteriorly in the medial segment left hepatic lobe on image 64 series 2 is also likely a cyst.  The noncontrast CT appearance of the spleen, adrenal glands, and pancreas is unchanged.  There is retroperitoneal stranding in the aortocaval space on image 62 of series 2, similar to prior exam and likely reflecting treated lymphoma.  Prominence of  stool throughout the colon suggests constipation. Sigmoid diverticulosis noted without active diverticulitis.  Stable cystic lesion of the left ovary, approximately 4.1 x 2.3 cm today.  The gallbladder appears unremarkable.  Dextroconvex lumbar scoliosis noted.  Central high density in the uterus appears stable.  No pathologic pelvic adenopathy is identified.  Lumbar spondylosis and degenerative disc disease noted with transitional lumbosacral vertebra and grade 1 posterior subluxations in the lumbar spine.  Prior right nephrectomy noted.  IMPRESSION:  1.  Stable appearance the abdomen without specific findings of recurrent malignancy. 2. Prominence  of stool throughout the colon suggests constipation. 3.  Hypodense liver lesions, probably benign. 4.  Left ovarian cystic lesion is prominent given the patient's age, but was not hypermetabolic on prior PET CT. 5.  Lumbar spondylosis and scoliosis. 6.  Prior right nephrectomy. 7.  Sigmoid diverticulosis.  Original Report Authenticated By: Dellia Cloud, M.D.    ASSESSMENT: This is a very pleasant 78 years old white female with history of renal cell carcinoma as well as stage IV non-Hodgkin lymphoma involving the bone marrow. She status post 6 cycles of systemic chemotherapy with CHOP/Rituxan. The patient tolerated her treatment fairly well except for fatigue from the chemotherapy-induced anemia. She has no evidence for lymphoma progression on the recent scans but had disease was mainly involving the bone marrow.  PLAN: I discussed the scan results with the patient and her son. I recommended for her to proceed with the bone marrow biopsy and aspirate for evaluation of the bone marrow involvement with lymphoma. This would be done as a next 2 weeks and the patient would come back for followup visit one week after the bone marrow procedure.  All questions were answered. The patient knows to call the clinic with any problems, questions or concerns. We can  certainly see the patient much sooner if necessary.

## 2011-02-18 ENCOUNTER — Encounter: Payer: Self-pay | Admitting: *Deleted

## 2011-02-18 DIAGNOSIS — C8589 Other specified types of non-Hodgkin lymphoma, extranodal and solid organ sites: Secondary | ICD-10-CM | POA: Diagnosis not present

## 2011-02-18 DIAGNOSIS — C649 Malignant neoplasm of unspecified kidney, except renal pelvis: Secondary | ICD-10-CM | POA: Diagnosis not present

## 2011-02-18 DIAGNOSIS — H543 Unqualified visual loss, both eyes: Secondary | ICD-10-CM | POA: Diagnosis not present

## 2011-02-18 DIAGNOSIS — F09 Unspecified mental disorder due to known physiological condition: Secondary | ICD-10-CM | POA: Diagnosis not present

## 2011-02-18 DIAGNOSIS — R269 Unspecified abnormalities of gait and mobility: Secondary | ICD-10-CM | POA: Diagnosis not present

## 2011-02-18 NOTE — Progress Notes (Signed)
FLOW, medical day called regarding BMBX 02/28/11 at 0800.  Called and gave pt instructions over the phone.  Sign and Hold order in place for medical day.  SLJ

## 2011-02-21 ENCOUNTER — Other Ambulatory Visit: Payer: Self-pay | Admitting: *Deleted

## 2011-02-21 ENCOUNTER — Telehealth: Payer: Self-pay | Admitting: Internal Medicine

## 2011-02-21 DIAGNOSIS — C8590 Non-Hodgkin lymphoma, unspecified, unspecified site: Secondary | ICD-10-CM

## 2011-02-21 DIAGNOSIS — C649 Malignant neoplasm of unspecified kidney, except renal pelvis: Secondary | ICD-10-CM | POA: Diagnosis not present

## 2011-02-21 DIAGNOSIS — H543 Unqualified visual loss, both eyes: Secondary | ICD-10-CM | POA: Diagnosis not present

## 2011-02-21 DIAGNOSIS — F09 Unspecified mental disorder due to known physiological condition: Secondary | ICD-10-CM | POA: Diagnosis not present

## 2011-02-21 DIAGNOSIS — R269 Unspecified abnormalities of gait and mobility: Secondary | ICD-10-CM | POA: Diagnosis not present

## 2011-02-21 DIAGNOSIS — C8589 Other specified types of non-Hodgkin lymphoma, extranodal and solid organ sites: Secondary | ICD-10-CM | POA: Diagnosis not present

## 2011-02-21 NOTE — Telephone Encounter (Signed)
called pt with 3/6 appt aom

## 2011-02-27 ENCOUNTER — Telehealth: Payer: Self-pay | Admitting: *Deleted

## 2011-02-27 NOTE — Telephone Encounter (Signed)
Pt called stating she has a bad cold and wanted to see if Dr Donnald Garre was okay to still proceed with BMBX on 02/28/11.  Pt denies fever. Per Dr Donnald Garre okay to proceed with BMBX.  Pt verbalized understanding.  SLJ

## 2011-02-28 ENCOUNTER — Ambulatory Visit (HOSPITAL_COMMUNITY)
Admission: RE | Admit: 2011-02-28 | Discharge: 2011-02-28 | Disposition: A | Discharge status: Home / Self Care | Payer: Medicare Other | Source: Ambulatory Visit | Attending: Internal Medicine | Admitting: Internal Medicine

## 2011-02-28 ENCOUNTER — Encounter (HOSPITAL_COMMUNITY): Payer: Self-pay | Admitting: Pharmacy Technician

## 2011-02-28 ENCOUNTER — Other Ambulatory Visit: Payer: Self-pay | Admitting: Internal Medicine

## 2011-02-28 DIAGNOSIS — C8589 Other specified types of non-Hodgkin lymphoma, extranodal and solid organ sites: Secondary | ICD-10-CM

## 2011-02-28 DIAGNOSIS — C8330 Diffuse large B-cell lymphoma, unspecified site: Secondary | ICD-10-CM

## 2011-02-28 DIAGNOSIS — D649 Anemia, unspecified: Secondary | ICD-10-CM | POA: Diagnosis not present

## 2011-02-28 LAB — DIFFERENTIAL
Basophils Relative: 0 % (ref 0–1)
Eosinophils Absolute: 0.1 10*3/uL (ref 0.0–0.7)
Lymphs Abs: 0.3 10*3/uL — ABNORMAL LOW (ref 0.7–4.0)
Monocytes Absolute: 0.4 10*3/uL (ref 0.1–1.0)
Monocytes Relative: 13 % — ABNORMAL HIGH (ref 3–12)
Neutrophils Relative %: 73 % (ref 43–77)

## 2011-02-28 LAB — CBC
HCT: 27.6 % — ABNORMAL LOW (ref 36.0–46.0)
Hemoglobin: 9.3 g/dL — ABNORMAL LOW (ref 12.0–15.0)
MCH: 36 pg — ABNORMAL HIGH (ref 26.0–34.0)
MCHC: 33.7 g/dL (ref 30.0–36.0)
RBC: 2.58 MIL/uL — ABNORMAL LOW (ref 3.87–5.11)

## 2011-02-28 MED ORDER — HEPARIN SOD (PORK) LOCK FLUSH 100 UNIT/ML IV SOLN
500.0000 [IU] | INTRAVENOUS | Status: AC | PRN
  Administered 2011-02-28: 500 [IU]

## 2011-02-28 MED ORDER — MIDAZOLAM HCL 10 MG/2ML IJ SOLN: 3.0000 mg | Freq: Once | INTRAMUSCULAR | Status: DC

## 2011-02-28 MED ORDER — MIDAZOLAM BOLUS VIA INFUSION: 3.0000 mg | Freq: Once | INTRAVENOUS | Status: DC

## 2011-02-28 MED ORDER — MIDAZOLAM HCL 10 MG/2ML IJ SOLN
INTRAMUSCULAR | Status: AC
  Filled 2011-02-28: qty 2

## 2011-02-28 MED ORDER — SODIUM CHLORIDE 0.9 % IV SOLN
Freq: Once | INTRAVENOUS | Status: AC
  Administered 2011-02-28: 08:00:00 via INTRAVENOUS

## 2011-02-28 MED ORDER — MEPERIDINE HCL 50 MG/ML IJ SOLN
INTRAMUSCULAR | Status: AC
  Filled 2011-02-28: qty 1

## 2011-02-28 MED ORDER — HEPARIN SOD (PORK) LOCK FLUSH 100 UNIT/ML IV SOLN
INTRAVENOUS | Status: AC
  Administered 2011-02-28: 500 [IU]
  Filled 2011-02-28: qty 5

## 2011-02-28 MED ORDER — MIDAZOLAM HCL 5 MG/5ML IJ SOLN
INTRAMUSCULAR | Status: AC | PRN
  Administered 2011-02-28 (×3): 1 mg via INTRAVENOUS

## 2011-02-28 NOTE — ED Notes (Signed)
Procedure ends pt placed supine with dressing to posterior iliac area towel to site for pressure

## 2011-02-28 NOTE — ED Notes (Signed)
Ambulated around the bed with a assist Tolerated this well. dsg to post iliac area CDI

## 2011-02-28 NOTE — ED Notes (Signed)
Patient is resting comfortably. 

## 2011-02-28 NOTE — ED Notes (Signed)
dsg post iliac area CDI

## 2011-02-28 NOTE — Procedures (Signed)
Bone Marrow Biopsy and Aspiration Procedure Note   Informed consent was obtained and potential risks including bleeding, infection and pain were reviewed with the patient.  Mallampati's class: I ASA class: I Posterior iliac crest(s) prepped with Betadine.   Lidocaine 2% local anesthesia infiltrated into the subcutaneous tissue.  Left bone marrow biopsy and left bone marrow aspirate was obtained.   The procedure was tolerated well and there were no complications.  Specimens sent for: routine histopathologic stains and sectioning, flow cytometry and cytogenetics  Physician: Lajuana Matte.

## 2011-02-28 NOTE — Sedation Documentation (Signed)
Medication dose calculated and verified [id number] 3mg 

## 2011-02-28 NOTE — ED Notes (Signed)
Patient denies pain and is resting comfortably.  

## 2011-03-07 DIAGNOSIS — H543 Unqualified visual loss, both eyes: Secondary | ICD-10-CM | POA: Diagnosis not present

## 2011-03-07 DIAGNOSIS — R269 Unspecified abnormalities of gait and mobility: Secondary | ICD-10-CM | POA: Diagnosis not present

## 2011-03-07 DIAGNOSIS — F09 Unspecified mental disorder due to known physiological condition: Secondary | ICD-10-CM | POA: Diagnosis not present

## 2011-03-07 DIAGNOSIS — C8589 Other specified types of non-Hodgkin lymphoma, extranodal and solid organ sites: Secondary | ICD-10-CM | POA: Diagnosis not present

## 2011-03-07 DIAGNOSIS — C649 Malignant neoplasm of unspecified kidney, except renal pelvis: Secondary | ICD-10-CM | POA: Diagnosis not present

## 2011-03-10 DIAGNOSIS — C649 Malignant neoplasm of unspecified kidney, except renal pelvis: Secondary | ICD-10-CM | POA: Diagnosis not present

## 2011-03-10 DIAGNOSIS — H543 Unqualified visual loss, both eyes: Secondary | ICD-10-CM | POA: Diagnosis not present

## 2011-03-10 DIAGNOSIS — F09 Unspecified mental disorder due to known physiological condition: Secondary | ICD-10-CM | POA: Diagnosis not present

## 2011-03-10 DIAGNOSIS — R269 Unspecified abnormalities of gait and mobility: Secondary | ICD-10-CM | POA: Diagnosis not present

## 2011-03-10 DIAGNOSIS — C8589 Other specified types of non-Hodgkin lymphoma, extranodal and solid organ sites: Secondary | ICD-10-CM | POA: Diagnosis not present

## 2011-03-12 ENCOUNTER — Ambulatory Visit (HOSPITAL_BASED_OUTPATIENT_CLINIC_OR_DEPARTMENT_OTHER): Payer: Medicare Other | Admitting: Internal Medicine

## 2011-03-12 ENCOUNTER — Other Ambulatory Visit (HOSPITAL_BASED_OUTPATIENT_CLINIC_OR_DEPARTMENT_OTHER): Payer: Medicare Other | Admitting: Lab

## 2011-03-12 DIAGNOSIS — C8589 Other specified types of non-Hodgkin lymphoma, extranodal and solid organ sites: Secondary | ICD-10-CM

## 2011-03-12 DIAGNOSIS — C8590 Non-Hodgkin lymphoma, unspecified, unspecified site: Secondary | ICD-10-CM

## 2011-03-12 DIAGNOSIS — C8330 Diffuse large B-cell lymphoma, unspecified site: Secondary | ICD-10-CM

## 2011-03-12 DIAGNOSIS — C649 Malignant neoplasm of unspecified kidney, except renal pelvis: Secondary | ICD-10-CM | POA: Diagnosis not present

## 2011-03-12 LAB — COMPREHENSIVE METABOLIC PANEL
AST: 22 U/L (ref 0–37)
Alkaline Phosphatase: 106 U/L (ref 39–117)
BUN: 16 mg/dL (ref 6–23)
Creatinine, Ser: 0.9 mg/dL (ref 0.50–1.10)
Glucose, Bld: 91 mg/dL (ref 70–99)
Total Bilirubin: 0.4 mg/dL (ref 0.3–1.2)

## 2011-03-12 LAB — CBC WITH DIFFERENTIAL/PLATELET
Basophils Absolute: 0 10*3/uL (ref 0.0–0.1)
EOS%: 2.4 % (ref 0.0–7.0)
Eosinophils Absolute: 0.1 10*3/uL (ref 0.0–0.5)
HGB: 10.9 g/dL — ABNORMAL LOW (ref 11.6–15.9)
LYMPH%: 8.3 % — ABNORMAL LOW (ref 14.0–49.7)
MCH: 36.8 pg — ABNORMAL HIGH (ref 25.1–34.0)
MCV: 109.8 fL — ABNORMAL HIGH (ref 79.5–101.0)
MONO%: 3.1 % (ref 0.0–14.0)
NEUT#: 4.7 10*3/uL (ref 1.5–6.5)
Platelets: 211 10*3/uL (ref 145–400)
RBC: 2.98 10*6/uL — ABNORMAL LOW (ref 3.70–5.45)

## 2011-03-12 NOTE — Progress Notes (Signed)
[facility] [phone]   Fax:([phone]) [phone]  OFFICE PROGRESS NOTE  Melissa Reeve, MD, MD [address] [place] [address]  DIAGNOSIS: #1 non-Hodgkin's lymphoma diagnosed on bone marrow biopsy in June of 2012  #2 stage III (T3a N0M0) renal cell carcinoma diagnosed in June of 2012   PRIOR THERAPY:  1) Status post right nephrectomy under the care of Dr. Retta Diones on 09/02/2010. 2) Systemic chemotherapy with CHOP/Rituxan given every 3 weeks, status post 6 cycles.  CURRENT THERAPY: Observation.   INTERVAL HISTORY: AMBREEN Cross 78 y.o. female returns to the clinic today for followup visit accompanied by her son [name]. The patient is doing fine today except for generalized fatigue as well as weight loss. She denied having any significant night sweats. She has no chest pain or shortness of breath. She is still recovering slowly from her previous course of systemic chemotherapy. The patient has a bone marrow biopsy and aspirate performed on 02/28/2011 and she is here today for evaluation and discussion of her biopsy results.  MEDICAL HISTORY: Past Medical History  Diagnosis Date  . Renal cell carcinoma   . Hypertension   . Hypercholesteremia   . Non Hodgkin's lymphoma   . Glaucoma   . Arthritis   . Anemia   . Stroke     hx of TIA  . Blood transfusion   . Hypothyroidism   . Hypothyroid     on synthroid   . Cancer     kidney cancer, NHL   . Cancer     last chemo 11/04/10   . Thrush 12/17/2010    ALLERGIES:  is allergic to dilaudid.  MEDICATIONS:  Current Outpatient Prescriptions  Medication Sig Dispense Refill  . allopurinol (ZYLOPRIM) 100 MG tablet Take 1 tablet (100 mg total) by mouth 2 (two) times daily.  60 tablet  0  . aspirin EC 81 MG tablet Take 81 mg by mouth daily.      . cholecalciferol (VITAMIN D) 400 UNITS TABS Take 400 Units by mouth daily.        . Coenzyme Q10 100 MG TABS Take 1 tablet by mouth daily.      .  fluconazole (DIFLUCAN) 100 MG tablet Take 100 mg by mouth as needed. She takes when receiving her chemo treatments for thrush.      . Garlic 1000 MG CAPS Take 1,000 mg by mouth daily.        . Ginger, Zingiber officinalis, (GINGER ROOT) 550 MG CAPS Take 550 mg by mouth daily.        [name] levothyroxine (SYNTHROID, LEVOTHROID) 50 MCG tablet Take 50 mcg by mouth daily.        [name] lidocaine-prilocaine (EMLA) cream Apply 1 application topically as needed. For port-a-cath access.      . Multiple Vitamin (MULITIVITAMIN WITH MINERALS) TABS Take 1 tablet by mouth daily.        . nadolol (CORGARD) 40 MG tablet Take 40 mg by mouth daily.      [name] nystatin (MYCOSTATIN) 100000 UNIT/ML suspension Take 500,000 Units by mouth 4 (four) times daily as needed. For thrush.       . predniSONE (DELTASONE) 50 MG tablet Take 50 mg by mouth daily as needed. 5 day course of therapy post-chemo treatments.      . simvastatin (ZOCOR) 20 MG tablet Take 7 mg by mouth at bedtime. She takes a third of a tablet.  SURGICAL HISTORY:  Past Surgical History  Procedure Date  . Nephrectomy     Right Side  . Tonsillectomy   . Portacath placement     right subclavian    REVIEW OF SYSTEMS:  A comprehensive review of systems was negative except for: Constitutional: positive for fatigue and weight loss   PHYSICAL EXAMINATION: General appearance: alert, cooperative, appears stated age and no distress Neck: no adenopathy Lymph nodes: Cervical, supraclavicular, and axillary nodes normal. Resp: clear to auscultation bilaterally Cardio: regular rate and rhythm, S1, S2 normal, no murmur, click, rub or gallop GI: soft, non-tender; bowel sounds normal; no masses,  no organomegaly Extremities: extremities normal, atraumatic, no cyanosis or edema Neurologic: Alert and oriented X 3, normal strength and tone. Normal symmetric reflexes. Normal coordination and gait  ECOG PERFORMANCE STATUS: 1 - Symptomatic but completely ambulatory  Blood  pressure 150/84, pulse 78, temperature 97 F (36.1 C), temperature source Oral, height 5\' 3"  (1.6 m), weight 110 lb 9.6 oz (50.168 kg).  LABORATORY DATA: Lab Results  Component Value Date   WBC 5.4 03/12/2011   HGB 10.9* 03/12/2011   HCT 32.7* 03/12/2011   MCV 109.8* 03/12/2011   PLT 211 03/12/2011      Chemistry      Component Value Date/Time   NA 146* 03/12/2011 0842   K 3.8 03/12/2011 0842   CL 107 03/12/2011 0842   CO2 30 03/12/2011 0842   BUN 16 03/12/2011 0842   CREATININE 0.90 03/12/2011 0842      Component Value Date/Time   CALCIUM 9.7 03/12/2011 0842   ALKPHOS 106 03/12/2011 0842   AST 22 03/12/2011 0842   ALT 12 03/12/2011 0842   BILITOT 0.4 03/12/2011 0842       RADIOGRAPHIC STUDIES: Ct Abdomen Pelvis Wo Contrast  02/14/2011  *RADIOLOGY REPORT*  Clinical Data:  Non-Hodgkins lymphoma.  Chemotherapy in progress. History renal cell carcinoma.  CT CHEST, ABDOMEN AND PELVIS WITHOUT CONTRAST  Technique:  Multidetector CT imaging of the chest, abdomen and pelvis was performed following the standard protocol without IV contrast.  Comparison:  12/13/2010  CT CHEST  Findings:  Ectatic thoracic aorta measures approximately 4 cm in its ascending portion.  Coronary artery atherosclerotic calcification noted.  No pathologic thoracic adenopathy noted.  The lungs appear clear.  IMPRESSION:  1.  Stable appearance of the chest without findings of thoracic malignancy.  CT ABDOMEN AND PELVIS  Findings:  Stable 1.5 x 0.7 cm hypodense lesion in the right hepatic lobe on image 55 of series 2, likely a cyst.  Small hypodense lesion anteriorly in the medial segment left hepatic lobe on image 64 series 2 is also likely a cyst.  The noncontrast CT appearance of the spleen, adrenal glands, and pancreas is unchanged.  There is retroperitoneal stranding in the aortocaval space on image 62 of series 2, similar to prior exam and likely reflecting treated lymphoma.  Prominence of stool throughout the colon suggests constipation.  Sigmoid diverticulosis noted without active diverticulitis.  Stable cystic lesion of the left ovary, approximately 4.1 x 2.3 cm today.  The gallbladder appears unremarkable.  Dextroconvex lumbar scoliosis noted.  Central high density in the uterus appears stable.  No pathologic pelvic adenopathy is identified.  Lumbar spondylosis and degenerative disc disease noted with transitional lumbosacral vertebra and grade 1 posterior subluxations in the lumbar spine.  Prior right nephrectomy noted.  IMPRESSION:  1.  Stable appearance the abdomen without specific findings of recurrent malignancy. 2. Prominence of stool throughout the  colon suggests constipation. 3.  Hypodense liver lesions, probably benign. 4.  Left ovarian cystic lesion is prominent given the patient's age, but was not hypermetabolic on prior PET CT. 5.  Lumbar spondylosis and scoliosis. 6.  Prior right nephrectomy. 7.  Sigmoid diverticulosis.  Original Report Authenticated By: Dellia Cloud, M.D.   BONE MARROW REPORT on 02/28/11: FINAL DIAGNOSIS Diagnosis Bone Marrow, Aspirate,Biopsy, and Clot, left superior posterior iliac crest - SLIGHTLY HYPERCELLULAR BONE MARROW FOR AGE WITH TRILINEAGE HEMATOPOIESIS. - OCCASIONAL SMALL LYMPHOID AGGREGATES PRESENT. - INCREASED IRON STORES. - SEE COMMENT. PERIPHERAL BLOOD: - MACROCYTIC ANEMIA. - LEUKOPENIA. Diagnosis Note The bone marrow shows a few very minute lymphoid aggregates primarily composed of small lymphocytes with occasional admixture with plasma cells. Flow cytometric analysis of the lymphoid population shows essential absence of B-cells in the sample. Additionally, immunohistochemical stains were performed on clot and biopsy sections and while the very small lymphoid aggregates are not well represented on deeper sectioning, there is no significant B-cell or plasma cell components present in this biopsy. Hence, I do not believe that there is any diagnostic evidence of residual  lymphoma in this material. Clinical correlation is recommended. (BNS:eps:jy) 03/04/11) Guerry Bruin MD Pathologist, Electronic Signature  ASSESSMENT: This is a very pleasant 78 years old white female with a history of non-Hodgkin lymphoma involving the bone marrow status post 6 cycles of systemic chemotherapy with CHOP/Rituxan with almost complete response, in addition to a history of stage III renal cell carcinoma, status post right nephrectomy. The patient is doing fine with no evidence for residual disease on the bone marrow biopsy and aspirate and negative CT scan for lymphoma or metastatic renal cell carcinoma.  PLAN: I discussed the biopsy result with the patient and her son. I recommended for her continuous observation for now. I will repeat CT scan of the chest, abdomen and pelvis without contrast in 6 months for evaluation of her disease. The patient was advised to call me immediately if she has any concerning symptoms in the interval.  All questions were answered. The patient knows to call the clinic with any problems, questions or concerns. We can certainly see the patient much sooner if necessary.

## 2011-03-13 DIAGNOSIS — C649 Malignant neoplasm of unspecified kidney, except renal pelvis: Secondary | ICD-10-CM | POA: Diagnosis not present

## 2011-03-14 DIAGNOSIS — R269 Unspecified abnormalities of gait and mobility: Secondary | ICD-10-CM | POA: Diagnosis not present

## 2011-03-14 DIAGNOSIS — C8589 Other specified types of non-Hodgkin lymphoma, extranodal and solid organ sites: Secondary | ICD-10-CM | POA: Diagnosis not present

## 2011-03-14 DIAGNOSIS — C649 Malignant neoplasm of unspecified kidney, except renal pelvis: Secondary | ICD-10-CM | POA: Diagnosis not present

## 2011-03-14 DIAGNOSIS — F09 Unspecified mental disorder due to known physiological condition: Secondary | ICD-10-CM | POA: Diagnosis not present

## 2011-03-14 DIAGNOSIS — H543 Unqualified visual loss, both eyes: Secondary | ICD-10-CM | POA: Diagnosis not present

## 2011-03-17 ENCOUNTER — Other Ambulatory Visit: Payer: Self-pay | Admitting: Medical Oncology

## 2011-03-17 NOTE — Progress Notes (Signed)
Called to ask if she needs to be on allopurinol and to verify ct scan and for 9/13. I will clarify with Dr Donnald Garre and the  Scheduler. I called pt and told her to stop her allopurinol and her Ct scan should be without contrast of chest , abdomen and pelvis per Dr Donnald Garre. [name] Her copy of scheduled appointments says she has CT of body with contrast .and Ct body without contrast . I will ask scheduler to d/c ct with contrast and change ct without contrast to chest , abdomen and pelvis

## 2011-03-26 ENCOUNTER — Encounter: Payer: Self-pay | Admitting: Internal Medicine

## 2011-04-14 DIAGNOSIS — E039 Hypothyroidism, unspecified: Secondary | ICD-10-CM | POA: Diagnosis not present

## 2011-04-14 DIAGNOSIS — R634 Abnormal weight loss: Secondary | ICD-10-CM | POA: Diagnosis not present

## 2011-04-14 DIAGNOSIS — N390 Urinary tract infection, site not specified: Secondary | ICD-10-CM | POA: Diagnosis not present

## 2011-04-25 ENCOUNTER — Ambulatory Visit (HOSPITAL_BASED_OUTPATIENT_CLINIC_OR_DEPARTMENT_OTHER): Payer: Medicare Other

## 2011-04-25 VITALS — BP 140/82 | HR 66 | Temp 98.0°F

## 2011-04-25 DIAGNOSIS — Z452 Encounter for adjustment and management of vascular access device: Secondary | ICD-10-CM

## 2011-04-25 DIAGNOSIS — C8589 Other specified types of non-Hodgkin lymphoma, extranodal and solid organ sites: Secondary | ICD-10-CM

## 2011-04-25 DIAGNOSIS — C8590 Non-Hodgkin lymphoma, unspecified, unspecified site: Secondary | ICD-10-CM

## 2011-04-25 MED ORDER — SODIUM CHLORIDE 0.9 % IJ SOLN
10.0000 mL | INTRAMUSCULAR | Status: DC | PRN
  Administered 2011-04-25: 10 mL via INTRAVENOUS
  Filled 2011-04-25: qty 10

## 2011-04-25 MED ORDER — HEPARIN SOD (PORK) LOCK FLUSH 100 UNIT/ML IV SOLN
500.0000 [IU] | Freq: Once | INTRAVENOUS | Status: AC
  Administered 2011-04-25: 500 [IU] via INTRAVENOUS
  Filled 2011-04-25: qty 5

## 2011-06-12 ENCOUNTER — Ambulatory Visit
Admission: RE | Admit: 2011-06-12 | Discharge: 2011-06-12 | Disposition: A | Discharge status: Home / Self Care | Payer: Medicare Other | Source: Ambulatory Visit | Attending: Family Medicine | Admitting: Family Medicine

## 2011-06-12 ENCOUNTER — Other Ambulatory Visit: Payer: Self-pay | Admitting: Family Medicine

## 2011-06-12 DIAGNOSIS — M899 Disorder of bone, unspecified: Secondary | ICD-10-CM | POA: Diagnosis not present

## 2011-06-12 DIAGNOSIS — D649 Anemia, unspecified: Secondary | ICD-10-CM | POA: Diagnosis not present

## 2011-06-12 DIAGNOSIS — M79672 Pain in left foot: Secondary | ICD-10-CM

## 2011-06-12 DIAGNOSIS — M79609 Pain in unspecified limb: Secondary | ICD-10-CM | POA: Diagnosis not present

## 2011-06-12 DIAGNOSIS — R011 Cardiac murmur, unspecified: Secondary | ICD-10-CM | POA: Diagnosis not present

## 2011-06-12 DIAGNOSIS — Z1331 Encounter for screening for depression: Secondary | ICD-10-CM | POA: Diagnosis not present

## 2011-06-12 DIAGNOSIS — E785 Hyperlipidemia, unspecified: Secondary | ICD-10-CM | POA: Diagnosis not present

## 2011-06-12 DIAGNOSIS — M949 Disorder of cartilage, unspecified: Secondary | ICD-10-CM | POA: Diagnosis not present

## 2011-06-17 ENCOUNTER — Telehealth: Payer: Self-pay | Admitting: *Deleted

## 2011-06-17 NOTE — Telephone Encounter (Signed)
I have called and moved the patient flush appt on Friday from 8am to 9am. Moved duer to a meeting.  JMW

## 2011-06-20 ENCOUNTER — Ambulatory Visit (HOSPITAL_BASED_OUTPATIENT_CLINIC_OR_DEPARTMENT_OTHER): Payer: Medicare Other

## 2011-06-20 VITALS — BP 154/81 | HR 69 | Temp 99.6°F

## 2011-06-20 DIAGNOSIS — C8589 Other specified types of non-Hodgkin lymphoma, extranodal and solid organ sites: Secondary | ICD-10-CM

## 2011-06-20 DIAGNOSIS — Z452 Encounter for adjustment and management of vascular access device: Secondary | ICD-10-CM

## 2011-06-20 DIAGNOSIS — C8590 Non-Hodgkin lymphoma, unspecified, unspecified site: Secondary | ICD-10-CM

## 2011-06-20 MED ORDER — HEPARIN SOD (PORK) LOCK FLUSH 100 UNIT/ML IV SOLN
500.0000 [IU] | Freq: Once | INTRAVENOUS | Status: AC
  Administered 2011-06-20: 500 [IU] via INTRAVENOUS
  Filled 2011-06-20: qty 5

## 2011-06-20 MED ORDER — SODIUM CHLORIDE 0.9 % IJ SOLN
10.0000 mL | INTRAMUSCULAR | Status: DC | PRN
  Administered 2011-06-20: 10 mL via INTRAVENOUS
  Filled 2011-06-20: qty 10

## 2011-06-30 ENCOUNTER — Telehealth: Payer: Self-pay | Admitting: Medical Oncology

## 2011-06-30 DIAGNOSIS — H6120 Impacted cerumen, unspecified ear: Secondary | ICD-10-CM | POA: Diagnosis not present

## 2011-06-30 DIAGNOSIS — R51 Headache: Secondary | ICD-10-CM | POA: Diagnosis not present

## 2011-06-30 DIAGNOSIS — B029 Zoster without complications: Secondary | ICD-10-CM | POA: Diagnosis not present

## 2011-06-30 NOTE — Telephone Encounter (Signed)
Pt called Dr Paulino Rily about her symptoms also and I told her since she is not under treatment by Dr Legrand Rams she needs to follow up with Dr Paulino Rily.

## 2011-08-15 ENCOUNTER — Ambulatory Visit (HOSPITAL_BASED_OUTPATIENT_CLINIC_OR_DEPARTMENT_OTHER): Payer: Medicare Other

## 2011-08-15 VITALS — BP 159/80 | HR 62 | Temp 97.8°F

## 2011-08-15 DIAGNOSIS — C8590 Non-Hodgkin lymphoma, unspecified, unspecified site: Secondary | ICD-10-CM

## 2011-08-15 DIAGNOSIS — Z452 Encounter for adjustment and management of vascular access device: Secondary | ICD-10-CM | POA: Diagnosis not present

## 2011-08-15 DIAGNOSIS — C8589 Other specified types of non-Hodgkin lymphoma, extranodal and solid organ sites: Secondary | ICD-10-CM

## 2011-08-15 MED ORDER — SODIUM CHLORIDE 0.9 % IJ SOLN
10.0000 mL | INTRAMUSCULAR | Status: DC | PRN
  Administered 2011-08-15: 10 mL via INTRAVENOUS
  Filled 2011-08-15: qty 10

## 2011-08-15 MED ORDER — HEPARIN SOD (PORK) LOCK FLUSH 100 UNIT/ML IV SOLN
500.0000 [IU] | Freq: Once | INTRAVENOUS | Status: AC
  Administered 2011-08-15: 500 [IU] via INTRAVENOUS
  Filled 2011-08-15: qty 5

## 2011-08-15 NOTE — Patient Instructions (Signed)
Call MD with any problems 

## 2011-09-09 ENCOUNTER — Other Ambulatory Visit: Payer: Self-pay | Admitting: Internal Medicine

## 2011-09-09 ENCOUNTER — Ambulatory Visit (HOSPITAL_COMMUNITY)
Admission: RE | Admit: 2011-09-09 | Discharge: 2011-09-09 | Disposition: A | Discharge status: Home / Self Care | Payer: Medicare Other | Source: Ambulatory Visit | Attending: Internal Medicine | Admitting: Internal Medicine

## 2011-09-09 ENCOUNTER — Other Ambulatory Visit (HOSPITAL_COMMUNITY): Payer: Medicare Other

## 2011-09-09 ENCOUNTER — Other Ambulatory Visit (HOSPITAL_BASED_OUTPATIENT_CLINIC_OR_DEPARTMENT_OTHER): Payer: Medicare Other | Admitting: Lab

## 2011-09-09 ENCOUNTER — Other Ambulatory Visit: Payer: Self-pay | Admitting: *Deleted

## 2011-09-09 DIAGNOSIS — Z905 Acquired absence of kidney: Secondary | ICD-10-CM | POA: Insufficient documentation

## 2011-09-09 DIAGNOSIS — C649 Malignant neoplasm of unspecified kidney, except renal pelvis: Secondary | ICD-10-CM | POA: Diagnosis not present

## 2011-09-09 DIAGNOSIS — I319 Disease of pericardium, unspecified: Secondary | ICD-10-CM | POA: Diagnosis not present

## 2011-09-09 DIAGNOSIS — C8330 Diffuse large B-cell lymphoma, unspecified site: Secondary | ICD-10-CM

## 2011-09-09 DIAGNOSIS — K7689 Other specified diseases of liver: Secondary | ICD-10-CM | POA: Insufficient documentation

## 2011-09-09 DIAGNOSIS — N9489 Other specified conditions associated with female genital organs and menstrual cycle: Secondary | ICD-10-CM | POA: Diagnosis not present

## 2011-09-09 DIAGNOSIS — C8589 Other specified types of non-Hodgkin lymphoma, extranodal and solid organ sites: Secondary | ICD-10-CM | POA: Diagnosis not present

## 2011-09-09 DIAGNOSIS — I77810 Thoracic aortic ectasia: Secondary | ICD-10-CM | POA: Diagnosis not present

## 2011-09-09 DIAGNOSIS — I517 Cardiomegaly: Secondary | ICD-10-CM | POA: Insufficient documentation

## 2011-09-09 DIAGNOSIS — I700 Atherosclerosis of aorta: Secondary | ICD-10-CM | POA: Insufficient documentation

## 2011-09-09 DIAGNOSIS — I2510 Atherosclerotic heart disease of native coronary artery without angina pectoris: Secondary | ICD-10-CM | POA: Diagnosis not present

## 2011-09-09 LAB — CBC WITH DIFFERENTIAL/PLATELET
Basophils Absolute: 0 10*3/uL (ref 0.0–0.1)
Eosinophils Absolute: 0.1 10*3/uL (ref 0.0–0.5)
HCT: 39.2 % (ref 34.8–46.6)
HGB: 13.4 g/dL (ref 11.6–15.9)
LYMPH%: 26.1 % (ref 14.0–49.7)
MCV: 100.7 fL (ref 79.5–101.0)
MONO#: 0.3 10*3/uL (ref 0.1–0.9)
NEUT#: 1 10*3/uL — ABNORMAL LOW (ref 1.5–6.5)
NEUT%: 50.8 % (ref 38.4–76.8)
Platelets: 178 10*3/uL (ref 145–400)
RBC: 3.89 10*6/uL (ref 3.70–5.45)
WBC: 2 10*3/uL — ABNORMAL LOW (ref 3.9–10.3)

## 2011-09-09 LAB — COMPREHENSIVE METABOLIC PANEL (CC13)
BUN: 12 mg/dL (ref 7.0–26.0)
CO2: 31 mEq/L — ABNORMAL HIGH (ref 22–29)
Glucose: 92 mg/dl (ref 70–99)
Sodium: 140 mEq/L (ref 136–145)
Total Bilirubin: 0.5 mg/dL (ref 0.20–1.20)
Total Protein: 6.4 g/dL (ref 6.4–8.3)

## 2011-09-09 LAB — TECHNOLOGIST REVIEW

## 2011-09-10 ENCOUNTER — Telehealth: Payer: Self-pay | Admitting: *Deleted

## 2011-09-10 ENCOUNTER — Ambulatory Visit (HOSPITAL_BASED_OUTPATIENT_CLINIC_OR_DEPARTMENT_OTHER): Payer: Medicare Other | Admitting: Internal Medicine

## 2011-09-10 VITALS — BP 178/93 | HR 76 | Temp 97.1°F | Resp 16 | Ht 63.0 in | Wt 113.6 lb

## 2011-09-10 DIAGNOSIS — C8590 Non-Hodgkin lymphoma, unspecified, unspecified site: Secondary | ICD-10-CM

## 2011-09-10 DIAGNOSIS — D709 Neutropenia, unspecified: Secondary | ICD-10-CM

## 2011-09-10 DIAGNOSIS — C649 Malignant neoplasm of unspecified kidney, except renal pelvis: Secondary | ICD-10-CM

## 2011-09-10 DIAGNOSIS — D72819 Decreased white blood cell count, unspecified: Secondary | ICD-10-CM

## 2011-09-10 DIAGNOSIS — C8589 Other specified types of non-Hodgkin lymphoma, extranodal and solid organ sites: Secondary | ICD-10-CM

## 2011-09-10 NOTE — Telephone Encounter (Signed)
Made patient appointment for month with the md looked at schedule bone marrow has all ready been scheduled

## 2011-09-10 NOTE — Telephone Encounter (Signed)
Pt instructions given to pt regarding BMBX at short stay 9/20 at 8:00am.  FLOW called and informed of appt.  SLJ

## 2011-09-10 NOTE — Patient Instructions (Signed)
Your scan is fine today. You still have low white blood count Would consider repeat bone marrow biopsy and aspirate in the next few weeks Followup appointment in one month

## 2011-09-10 NOTE — Progress Notes (Signed)
[facility] Telephone:(336) [phone]   Fax:(336) [phone]  OFFICE PROGRESS NOTE  Melissa Reeve, MD [address] [place] [address]  DIAGNOSIS:  #1 non-Hodgkin's lymphoma diagnosed on bone marrow biopsy in June of 2012  #2 stage III (T3a N0M0) renal cell carcinoma diagnosed in June of 2012   PRIOR THERAPY:  1) Status post right nephrectomy under the care of Dr. Retta Diones on 09/02/2010.  2) Systemic chemotherapy with CHOP/Rituxan given every 3 weeks, status post 6 cycles.   CURRENT THERAPY: Observation.  INTERVAL HISTORY: Melissa Cross 78 y.o. female returns to the clinic today for six-month followup visit accompanied her son. The patient is feeling fine today with no specific complaints. She denied having any significant weight loss or night sweats. She denied having any chest pain, shortness breath, cough or hemoptysis. The patient denied having any bleeding issues. She has repeat CBC, comprehensive metabolic panel, LDH as well as CT scan of the chest, abdomen and pelvis performed recently and the patient is here today for evaluation and discussion of her lab and discussion results.  MEDICAL HISTORY: Past Medical History  Diagnosis Date  . Renal cell carcinoma   . Hypertension   . Hypercholesteremia   . Non Hodgkin's lymphoma   . Glaucoma   . Arthritis   . Anemia   . Stroke     hx of TIA  . Blood transfusion   . Hypothyroidism   . Hypothyroid     on synthroid   . Cancer     kidney cancer, NHL   . Cancer     last chemo 11/04/10   . Thrush 12/17/2010    ALLERGIES:  is allergic to dilaudid.  MEDICATIONS:  Current Outpatient Prescriptions  Medication Sig Dispense Refill  . allopurinol (ZYLOPRIM) 100 MG tablet Take 1 tablet (100 mg total) by mouth 2 (two) times daily.  60 tablet  0  . aspirin EC 81 MG tablet Take 81 mg by mouth daily.      . cholecalciferol (VITAMIN D) 400 UNITS TABS Take 400 Units by mouth daily.        . Coenzyme  Q10 100 MG TABS Take 1 tablet by mouth daily.      . fluconazole (DIFLUCAN) 100 MG tablet Take 100 mg by mouth as needed. She takes when receiving her chemo treatments for thrush.      . Garlic 1000 MG CAPS Take 1,000 mg by mouth daily.        . Ginger, Zingiber officinalis, (GINGER ROOT) 550 MG CAPS Take 550 mg by mouth daily.        [name] levothyroxine (SYNTHROID, LEVOTHROID) 50 MCG tablet Take 50 mcg by mouth daily.        [name] lidocaine-prilocaine (EMLA) cream Apply 1 application topically as needed. For port-a-cath access.      . Multiple Vitamin (MULITIVITAMIN WITH MINERALS) TABS Take 1 tablet by mouth daily.        . nadolol (CORGARD) 40 MG tablet Take 40 mg by mouth daily.      [name] nystatin (MYCOSTATIN) 100000 UNIT/ML suspension Take 500,000 Units by mouth 4 (four) times daily as needed. For thrush.       . predniSONE (DELTASONE) 50 MG tablet Take 50 mg by mouth daily as needed. 5 day course of therapy post-chemo treatments.      . simvastatin (ZOCOR) 20 MG tablet Take 7 mg by mouth at bedtime. She takes a third of a tablet.  SURGICAL HISTORY:  Past Surgical History  Procedure Date  . Nephrectomy     Right Side  . Tonsillectomy   . Portacath placement     right subclavian    REVIEW OF SYSTEMS:  A comprehensive review of systems was negative.   PHYSICAL EXAMINATION: General appearance: alert, cooperative and no distress Head: Normocephalic, without obvious abnormality, atraumatic Neck: no adenopathy Lymph nodes: Cervical, supraclavicular, and axillary nodes normal. Resp: clear to auscultation bilaterally Cardio: regular rate and rhythm, S1, S2 normal, no murmur, click, rub or gallop GI: soft, non-tender; bowel sounds normal; no masses,  no organomegaly Extremities: extremities normal, atraumatic, no cyanosis or edema Neurologic: Alert and oriented X 3, normal strength and tone. Normal symmetric reflexes. Normal coordination and gait  ECOG PERFORMANCE STATUS: 1 - Symptomatic but  completely ambulatory  There were no vitals taken for this visit.  LABORATORY DATA: Lab Results  Component Value Date   WBC 2.0* 09/09/2011   HGB 13.4 09/09/2011   HCT 39.2 09/09/2011   MCV 100.7 09/09/2011   PLT 178 09/09/2011      Chemistry      Component Value Date/Time   NA 140 09/09/2011 0908   NA 146* 03/12/2011 0842   K 4.4 09/09/2011 0908   K 3.8 03/12/2011 0842   CL 100 09/09/2011 0908   CL 107 03/12/2011 0842   CO2 31* 09/09/2011 0908   CO2 30 03/12/2011 0842   BUN 12.0 09/09/2011 0908   BUN 16 03/12/2011 0842   CREATININE 0.9 09/09/2011 0908   CREATININE 0.90 03/12/2011 0842      Component Value Date/Time   CALCIUM 9.9 09/09/2011 0908   CALCIUM 9.7 03/12/2011 0842   ALKPHOS 131 09/09/2011 0908   ALKPHOS 106 03/12/2011 0842   AST 30 09/09/2011 0908   AST 22 03/12/2011 0842   ALT 24 09/09/2011 0908   ALT 12 03/12/2011 0842   BILITOT 0.50 09/09/2011 0908   BILITOT 0.4 03/12/2011 0842       RADIOGRAPHIC STUDIES: Ct Chest Wo Contrast  09/09/2011  *RADIOLOGY REPORT*  Clinical Data:  Restaging for lymphoma.  Diffuse large cell lymphoma.  Right renal cell carcinoma diagnosed in 2012.  CT CHEST, ABDOMEN AND PELVIS WITHOUT CONTRAST  Technique:  Multidetector CT imaging of the chest, abdomen and pelvis was performed following the standard protocol without IV contrast.  Comparison:  02/14/2011   CT CHEST  Findings:  Right chest wall Port-A-Cath.  Terminates near the junction of the superior vena cava and right atrium.  Mild cardiomegaly.  Coronary artery atherosclerotic calcification. Stable mildly ectatic thoracic aorta with scattered atherosclerotic calcification.  Normal thyroid gland.  Negative for supraclavicular, axillary, mediastinal, or hilar lymphadenopathy.  Esophagus is unremarkable. Negative for pleural effusion.  Very tiny pericardial effusion noted anteriorly, new from prior study.  The trachea and mainstem bronchi are patent.  The lungs are well expanded and clear.  No nodules, masses, or airspace disease.   Soft tissues of the breasts are unremarkable by CT.  Thoracic spine vertebral bodies are normal in height alignment.  No acute or suspicious osseous abnormality.  IMPRESSION:  1.  No evidence of lymphoma in the chest. 2.  Tiny pericardial effusion, new. 3.  Mild cardiomegaly, and atherosclerotic calcification of the coronary arteries and ectatic thoracic aorta.   CT ABDOMEN AND PELVIS  Findings:  Right nephrectomy.  Nephrectomy bed unremarkable. Stable 1.5 cm circumscribed low density lesion in the right hepatic lobe. Previously described left hepatic lobe low density lesion not  definite visualized on today's study.  No new hepatic lesion or biliary ductal dilatation.  There is mild fullness of the left renal collecting system without definite hydronephrosis, stable compared to prior examination of December 2012.  The left kidney is normal in contour and size.  No evidence of renal mass on noncontrast imaging.  Negative for renal calculus.  The spleen, gallbladder, adrenal glands, and pancreas have normal noncontrast appearances.  Stable retroperitoneal soft tissue stranding in the aortocaval space on image number 62.  This likely reflects treated lymphoma.  No mesenteric or retroperitoneal abdominal or pelvic lymphadenopathy is identified.  Atherosclerotic calcification of the normal caliber abdominal aorta.  Left adnexal cyst measuring approximately 3.8 x 2.5 cm, with suggestion of a thin internal septation is stable in size.  Bowel loops are normal in caliber.  The appendix is normal. Urinary bladder is normal.  Faint high density within the central use could reflect some endometrial calcifications or calcified fibroids.  Urinary bladder has normal appearances.  Negative for ascites.  Stable dextroscoliosis of the lumbar spine.  Vertebral bodies are normal in height.  Grade 1 retrolisthesis of L2 on L3 is stable, with marked degenerative disc disease at L2-3 and moderate degenerative disc disease at L3-4 L5 S1.   No acute or suspicious bony abnormality is identified.  IMPRESSION:  1.  Stable examination.  Negative for abdominal or pelvic lymphadenopathy.  Stable stranding in the aortocaval space of the retroperitoneum likely reflects treated lymphoma. 2.  Stable approximately 4 cm left adnexal cystic mass. This area was not hypermetabolic on prior PET CT. 3.  Right nephrectomy. 4.  Stable circumscribed probable right hepatic lobe cyst. Previously described left hepatic lobe low density lesion not definitely visualized.   Original Report Authenticated By: Britta Mccreedy, M.D.     ASSESSMENT: This is a very pleasant 78 years old white female with history of non-Hodgkin lymphoma involving the bone marrow as well as a stage III renal cell carcinoma both diagnosed in June of 2012. The patient has no evidence for disease recurrence on the recent CT scan of the chest, abdomen and pelvis but she has persistent lymphocytopenia and neutropenia.  PLAN: I discussed the scan and lab result with the patient and her son today. I recommended for her to proceed with the bone marrow biopsy and aspirate and the next few weeks for evaluation of her persistent leukocytopenia. I would see the patient back for followup visit in one month's for evaluation and discussion of her biopsy results. She was advised to call me immediately if she has any concerning symptoms in the interval..  All questions were answered. The patient knows to call the clinic with any problems, questions or concerns. We can certainly see the patient much sooner if necessary.

## 2011-09-22 ENCOUNTER — Encounter (HOSPITAL_COMMUNITY): Payer: Self-pay | Admitting: Pharmacy Technician

## 2011-09-24 ENCOUNTER — Other Ambulatory Visit (HOSPITAL_COMMUNITY): Payer: Self-pay | Admitting: *Deleted

## 2011-09-26 ENCOUNTER — Ambulatory Visit (HOSPITAL_COMMUNITY)
Admission: RE | Admit: 2011-09-26 | Discharge: 2011-09-26 | Disposition: A | Discharge status: Home / Self Care | Payer: Medicare Other | Source: Ambulatory Visit | Attending: Internal Medicine | Admitting: Internal Medicine

## 2011-09-26 ENCOUNTER — Telehealth: Payer: Self-pay | Admitting: Internal Medicine

## 2011-09-26 ENCOUNTER — Other Ambulatory Visit: Payer: Self-pay | Admitting: *Deleted

## 2011-09-26 VITALS — BP 154/75 | HR 64 | Temp 97.2°F | Resp 18 | Ht 63.0 in | Wt 114.0 lb

## 2011-09-26 DIAGNOSIS — C8589 Other specified types of non-Hodgkin lymphoma, extranodal and solid organ sites: Secondary | ICD-10-CM

## 2011-09-26 DIAGNOSIS — C8590 Non-Hodgkin lymphoma, unspecified, unspecified site: Secondary | ICD-10-CM

## 2011-09-26 DIAGNOSIS — D696 Thrombocytopenia, unspecified: Secondary | ICD-10-CM | POA: Diagnosis not present

## 2011-09-26 DIAGNOSIS — D709 Neutropenia, unspecified: Secondary | ICD-10-CM | POA: Diagnosis not present

## 2011-09-26 DIAGNOSIS — D649 Anemia, unspecified: Secondary | ICD-10-CM | POA: Diagnosis not present

## 2011-09-26 LAB — CBC WITH DIFFERENTIAL/PLATELET
Basophils Absolute: 0 10*3/uL (ref 0.0–0.1)
Eosinophils Absolute: 0.1 10*3/uL (ref 0.0–0.7)
Lymphocytes Relative: 52 % — ABNORMAL HIGH (ref 12–46)
Lymphs Abs: 0.8 10*3/uL (ref 0.7–4.0)
MCV: 98.3 fL (ref 78.0–100.0)
Monocytes Relative: 21 % — ABNORMAL HIGH (ref 3–12)
Platelets: 214 10*3/uL (ref 150–400)
RDW: 13.4 % (ref 11.5–15.5)
WBC: 1.4 10*3/uL — CL (ref 4.0–10.5)

## 2011-09-26 MED ORDER — MIDAZOLAM HCL 5 MG/5ML IJ SOLN
INTRAMUSCULAR | Status: DC | PRN
  Administered 2011-09-26 (×3): 1 mg via INTRAVENOUS

## 2011-09-26 MED ORDER — MIDAZOLAM HCL 10 MG/2ML IJ SOLN
INTRAMUSCULAR | Status: AC
  Filled 2011-09-26: qty 2

## 2011-09-26 MED ORDER — FILGRASTIM 300 MCG/ML IJ SOLN
300.0000 ug | Freq: Once | INTRAMUSCULAR | Status: AC
  Administered 2011-09-26: 300 ug via SUBCUTANEOUS
  Filled 2011-09-26: qty 1

## 2011-09-26 MED ORDER — SODIUM CHLORIDE 0.9 % IV SOLN
Freq: Once | INTRAVENOUS | Status: AC
Start: 2011-09-26 — End: 2011-09-26
  Administered 2011-09-26: 07:00:00 via INTRAVENOUS

## 2011-09-26 NOTE — ED Notes (Signed)
Patient is resting comfortably. 

## 2011-09-26 NOTE — ED Notes (Signed)
Procedure ends. dressing to left pot iliac area with hypafix and gauze. Pt placed supine with towel to site for pressure

## 2011-09-26 NOTE — ED Notes (Signed)
Sacral dressing CDI

## 2011-09-26 NOTE — Telephone Encounter (Signed)
s.w. pt and advised on sept 9.24.13 schedule.....Marland Kitchensed

## 2011-09-26 NOTE — ED Notes (Signed)
Family updated as to patient's status.

## 2011-09-26 NOTE — ED Notes (Signed)
Ambulated to BR with assist and tolerated this well. Dressing remains CDI

## 2011-09-26 NOTE — Progress Notes (Signed)
Port was not used to day for Bone Marrow Biopsy. Pt did not inform me that she had a port until after I had gotten a peripheral site left a/c. Asked if pt preferred to have PAC access and she declined because she had not applied her EMLA cream prior to arrival in Short stay

## 2011-09-26 NOTE — Procedures (Signed)
Bone Marrow Biopsy and Aspiration Procedure Note  Mallampati's class: 1 ASA class: 1 Informed consent was obtained and potential risks including bleeding, infection and pain were reviewed with the patient.   Posterior iliac crest(s) prepped with Betadine.   Lidocaine 2% local anesthesia infiltrated into the subcutaneous tissue.  Left bone marrow biopsy and left bone marrow aspirate was obtained.   The procedure was tolerated well and there were no complications.  Specimens sent for: routine histopathologic stains and sectioning, flow cytometry and cytogenetics  Physician: Myleah Cavendish K. 

## 2011-09-26 NOTE — Sedation Documentation (Signed)
Medication dose calculated and verified for VERSED 3 mg

## 2011-09-26 NOTE — ED Notes (Signed)
Pt placed on right side 

## 2011-09-27 ENCOUNTER — Emergency Department (HOSPITAL_COMMUNITY): Payer: Medicare Other

## 2011-09-27 ENCOUNTER — Encounter (HOSPITAL_COMMUNITY): Payer: Self-pay | Admitting: Emergency Medicine

## 2011-09-27 ENCOUNTER — Emergency Department (HOSPITAL_COMMUNITY)
Admission: EM | Admit: 2011-09-27 | Discharge: 2011-09-27 | Disposition: A | Discharge status: Home / Self Care | Payer: Medicare Other | Attending: Emergency Medicine | Admitting: Emergency Medicine

## 2011-09-27 DIAGNOSIS — Z8673 Personal history of transient ischemic attack (TIA), and cerebral infarction without residual deficits: Secondary | ICD-10-CM | POA: Insufficient documentation

## 2011-09-27 DIAGNOSIS — M542 Cervicalgia: Secondary | ICD-10-CM | POA: Insufficient documentation

## 2011-09-27 DIAGNOSIS — I10 Essential (primary) hypertension: Secondary | ICD-10-CM | POA: Diagnosis not present

## 2011-09-27 DIAGNOSIS — Z7982 Long term (current) use of aspirin: Secondary | ICD-10-CM | POA: Diagnosis not present

## 2011-09-27 DIAGNOSIS — R109 Unspecified abdominal pain: Secondary | ICD-10-CM | POA: Insufficient documentation

## 2011-09-27 DIAGNOSIS — M549 Dorsalgia, unspecified: Secondary | ICD-10-CM | POA: Diagnosis not present

## 2011-09-27 DIAGNOSIS — R079 Chest pain, unspecified: Secondary | ICD-10-CM

## 2011-09-27 DIAGNOSIS — Z79899 Other long term (current) drug therapy: Secondary | ICD-10-CM | POA: Insufficient documentation

## 2011-09-27 LAB — BASIC METABOLIC PANEL
CO2: 29 mEq/L (ref 19–32)
Calcium: 9.5 mg/dL (ref 8.4–10.5)
GFR calc Af Amer: 72 mL/min — ABNORMAL LOW (ref >90)
GFR calc non Af Amer: 63 mL/min — ABNORMAL LOW (ref >90)
Sodium: 140 mEq/L (ref 135–145)

## 2011-09-27 LAB — CBC
MCH: 33.5 pg (ref 26.0–34.0)
Platelets: 200 10*3/uL (ref 150–400)
RBC: 3.91 MIL/uL (ref 3.87–5.11)
RDW: 13.6 % (ref 11.5–15.5)

## 2011-09-27 LAB — POCT I-STAT TROPONIN I: Troponin i, poc: 0.02 ng/mL (ref 0.00–0.08)

## 2011-09-27 MED ORDER — PANTOPRAZOLE SODIUM 20 MG PO TBEC: 20.0000 mg | DELAYED_RELEASE_TABLET | Freq: Every day | ORAL | Status: DC

## 2011-09-27 MED ORDER — GI COCKTAIL ~~LOC~~
30.0000 mL | Freq: Once | ORAL | Status: AC
  Administered 2011-09-27: 30 mL via ORAL
  Filled 2011-09-27: qty 30

## 2011-09-27 MED ORDER — SODIUM CHLORIDE 0.9 % IV SOLN
Freq: Once | INTRAVENOUS | Status: AC
  Administered 2011-09-27: 17:00:00 via INTRAVENOUS

## 2011-09-27 MED ORDER — HYDROCODONE-ACETAMINOPHEN 5-325 MG PO TABS
1.0000 | ORAL_TABLET | Freq: Once | ORAL | Status: AC
  Administered 2011-09-27: 1 via ORAL
  Filled 2011-09-27: qty 1

## 2011-09-27 MED ORDER — MORPHINE SULFATE 4 MG/ML IJ SOLN
4.0000 mg | Freq: Once | INTRAMUSCULAR | Status: AC
  Administered 2011-09-27: 4 mg via INTRAVENOUS
  Filled 2011-09-27: qty 1

## 2011-09-27 MED ORDER — ALUM & MAG HYDROXIDE-SIMETH 200-200-20 MG/5ML PO SUSP
30.0000 mL | Freq: Once | ORAL | Status: AC
  Administered 2011-09-27: 30 mL via ORAL
  Filled 2011-09-27: qty 30

## 2011-09-27 MED ORDER — PANTOPRAZOLE SODIUM 40 MG IV SOLR
40.0000 mg | Freq: Once | INTRAVENOUS | Status: AC
  Administered 2011-09-27: 40 mg via INTRAVENOUS
  Filled 2011-09-27: qty 40

## 2011-09-27 MED ORDER — ONDANSETRON HCL 4 MG/2ML IJ SOLN
4.0000 mg | Freq: Once | INTRAMUSCULAR | Status: AC
  Administered 2011-09-27: 4 mg via INTRAVENOUS
  Filled 2011-09-27: qty 2

## 2011-09-27 MED ORDER — HYDROCODONE-ACETAMINOPHEN 5-325 MG PO TABS: 1.0000 | ORAL_TABLET | Freq: Four times a day (QID) | ORAL | Status: DC | PRN

## 2011-09-27 NOTE — ED Notes (Signed)
md at bedside  Pt alert and oriented x4. Respirations even and unlabored, bilateral symmetrical rise and fall of chest. Skin warm and dry. In no acute distress. Denies needs.   

## 2011-09-27 NOTE — ED Provider Notes (Signed)
History     CSN: [id number]  Arrival date & time 09/27/11  1452   First MD Initiated Contact with Patient 09/27/11 1633      Chief Complaint  Patient presents with  . Chest Pain    (Consider location/radiation/quality/duration/timing/severity/associated sxs/prior treatment) HPI Comments: Melissa Cross is a 78 y.o. Female who presents for evaluation of chest pain, back, pain, neck pain, and upper abdominal pain. The pain is a throbbing sensation. At the worst today it was 9/10.  When seen in emergency department it was 6/10. She has not had this previously. Last week she had imaging, chest and abdomen CT in followup of her lymphoma. They were normal. She has a history of thrush, only when being treated for cancer. She's not tried a medication for the problem. There are no known aggravating or palliative factors. Incidentally, the patient had a bone marrow biopsy yesterday ; left pelvis  The history is provided by the patient and a relative.    Past Medical History  Diagnosis Date  . Renal cell carcinoma   . Hypertension   . Hypercholesteremia   . Non Hodgkin's lymphoma   . Glaucoma   . Arthritis   . Anemia   . Stroke     hx of TIA  . Blood transfusion   . Hypothyroidism   . Hypothyroid     on synthroid   . Cancer     kidney cancer, NHL   . Cancer     last chemo 11/04/10   . Thrush 12/17/2010    Past Surgical History  Procedure Date  . Nephrectomy     Right Side  . Tonsillectomy   . Portacath placement     right subclavian    No family history on file.  History  Substance Use Topics  . Smoking status: Former Smoker -- 0.5 packs/day for 25 years  . Smokeless tobacco: Not on file  . Alcohol Use: No    OB History    Grav Para Term Preterm Abortions TAB SAB Ect Mult Living                  Review of Systems  All other systems reviewed and are negative.    Allergies  Crab and Dilaudid  Home Medications   Current Outpatient Rx  Name Route Sig  Dispense Refill  . ASPIRIN EC 81 MG PO TBEC Oral Take 81 mg by mouth at bedtime.     [name] CITRACAL PO Oral Take 1 tablet by mouth daily.    . CHOLECALCIFEROL 400 UNITS PO TABS Oral Take 400 Units by mouth daily.      [name] COENZYME Q10 100 MG PO TABS Oral Take 1 tablet by mouth daily.    [name] GARLIC 1000 MG PO CAPS Oral Take 1,000 mg by mouth daily.      [name] GINGER ROOT 550 MG PO CAPS Oral Take 550 mg by mouth daily.      [name] LEVOTHYROXINE SODIUM 50 MCG PO TABS Oral Take 50 mcg by mouth every morning.     . ADULT MULTIVITAMIN W/MINERALS CH Oral Take 1 tablet by mouth daily.      [name] NADOLOL 40 MG PO TABS Oral Take 40 mg by mouth at bedtime.     [name] SIMVASTATIN 20 MG PO TABS Oral Take 7 mg by mouth at bedtime. She takes a third of a tablet.    [name] UNABLE TO FIND Oral Take 2,000 mg by mouth daily. Wheat grass tablets    .  VITAMIN B-12 1000 MCG PO TABS Oral Take 1,000 mcg by mouth daily.    [name] HYDROCODONE-ACETAMINOPHEN 5-325 MG PO TABS Oral Take 1 tablet by mouth every 6 (six) hours as needed for pain. 20 tablet 0  . PANTOPRAZOLE SODIUM 20 MG PO TBEC Oral Take 1 tablet (20 mg total) by mouth daily. 30 tablet 0    BP 132/70  Pulse 62  Temp 98.5 F (36.9 C) (Oral)  Resp 16  SpO2 94%  Physical Exam  Nursing note and vitals reviewed. Constitutional: She is oriented to person, place, and time. She appears well-developed.       Frail, undernourished.  HENT:  Head: Normocephalic and atraumatic.  Eyes: Conjunctivae normal and EOM are normal. Pupils are equal, round, and reactive to light.  Neck: Normal range of motion and phonation normal. Neck supple.  Cardiovascular: Normal rate, regular rhythm and intact distal pulses.   Pulmonary/Chest: Effort normal and breath sounds normal. She exhibits no tenderness.  Abdominal: Soft. She exhibits no distension. There is no tenderness. There is no guarding.  Musculoskeletal: Normal range of motion.  Neurological: She is alert and oriented to person, place, and time. She  has normal strength. She exhibits normal muscle tone.  Skin: Skin is warm and dry.  Psychiatric: Her behavior is normal. Judgment and thought content normal.       Anxious    ED Course  Procedures (including critical care time)  Emergency department treatment: IV fluids, IV Protonix, oral GI Cocktail.   Reevaluation: 1925- Repeat vitals are reassuring     Date: 09/27/2011  Rate: 70  Rhythm: normal sinus rhythm  QRS Axis: normal  Intervals: normal  ST/T Wave abnormalities: normal  Conduction Disutrbances:none  Narrative Interpretation:   Old EKG Reviewed: unchanged    Labs Reviewed  CBC - Abnormal; Notable for the following:    WBC 3.9 (*)     All other components within normal limits  BASIC METABOLIC PANEL - Abnormal; Notable for the following:    GFR calc non Af Amer 63 (*)     GFR calc Af Amer 72 (*)     All other components within normal limits  POCT I-STAT TROPONIN I   Dg Chest Port 1 View  09/27/2011  *RADIOLOGY REPORT*  Clinical Data: Pain, history of Hodgkin's lymphoma  PORTABLE CHEST - 1 VIEW  Comparison: 09/09/2011  Findings: Cardiomediastinal silhouette is unremarkable.  No acute infiltrate or pulmonary edema.  There is right IJ Port-A-Cath with tip in SVC right atrium junction. No diagnostic pneumothorax.  IMPRESSION: No active disease.  No significant change.   Original Report Authenticated By: Natasha Mead, M.D.      1. Chest pain       MDM  Nonspecific chest discomfort, most likely related to esophagitis. Doubt ACS, PE, pneumonia, or metabolic instability. Patient improved in the ED, and is stable for discharge.   Plan: Home Medications- Protonix, Antacid of choice; Home Treatments- rest; Recommended follow up- PCP prn     Flint Melter, MD 09/28/11 [phone]

## 2011-09-27 NOTE — ED Notes (Signed)
Pt presents to ED from home c/o chest pain that radiates to neck and back. Pt denies SOB, N/V or diaphoresis. Pt rates pain 7/10. Pt states that this is a new onset of pain that she had not had before

## 2011-09-27 NOTE — ED Notes (Signed)
Wentz, MD at bedside.  

## 2011-09-30 ENCOUNTER — Telehealth: Payer: Self-pay | Admitting: Internal Medicine

## 2011-09-30 ENCOUNTER — Other Ambulatory Visit (HOSPITAL_BASED_OUTPATIENT_CLINIC_OR_DEPARTMENT_OTHER): Payer: Medicare Other | Admitting: Lab

## 2011-09-30 ENCOUNTER — Ambulatory Visit (HOSPITAL_BASED_OUTPATIENT_CLINIC_OR_DEPARTMENT_OTHER): Payer: Medicare Other | Admitting: Internal Medicine

## 2011-09-30 VITALS — BP 178/87 | HR 67 | Temp 97.5°F | Resp 18 | Ht 63.0 in | Wt 113.4 lb

## 2011-09-30 DIAGNOSIS — C8589 Other specified types of non-Hodgkin lymphoma, extranodal and solid organ sites: Secondary | ICD-10-CM | POA: Diagnosis not present

## 2011-09-30 DIAGNOSIS — D709 Neutropenia, unspecified: Secondary | ICD-10-CM

## 2011-09-30 DIAGNOSIS — C649 Malignant neoplasm of unspecified kidney, except renal pelvis: Secondary | ICD-10-CM

## 2011-09-30 DIAGNOSIS — R5383 Other fatigue: Secondary | ICD-10-CM

## 2011-09-30 DIAGNOSIS — R5381 Other malaise: Secondary | ICD-10-CM | POA: Diagnosis not present

## 2011-09-30 DIAGNOSIS — C8330 Diffuse large B-cell lymphoma, unspecified site: Secondary | ICD-10-CM

## 2011-09-30 DIAGNOSIS — C641 Malignant neoplasm of right kidney, except renal pelvis: Secondary | ICD-10-CM | POA: Insufficient documentation

## 2011-09-30 DIAGNOSIS — C8590 Non-Hodgkin lymphoma, unspecified, unspecified site: Secondary | ICD-10-CM

## 2011-09-30 DIAGNOSIS — R634 Abnormal weight loss: Secondary | ICD-10-CM

## 2011-09-30 LAB — CBC WITH DIFFERENTIAL/PLATELET
BASO%: 0.6 % (ref 0.0–2.0)
EOS%: 2.5 % (ref 0.0–7.0)
MCH: 34.3 pg — ABNORMAL HIGH (ref 25.1–34.0)
MCHC: 34.3 g/dL (ref 31.5–36.0)
MONO%: 11.9 % (ref 0.0–14.0)
RBC: 3.89 10*6/uL (ref 3.70–5.45)
RDW: 14.7 % — ABNORMAL HIGH (ref 11.2–14.5)
lymph#: 1 10*3/uL (ref 0.9–3.3)

## 2011-09-30 LAB — COMPREHENSIVE METABOLIC PANEL (CC13)
ALT: 18 U/L (ref 0–55)
AST: 21 U/L (ref 5–34)
Albumin: 3.9 g/dL (ref 3.5–5.0)
Alkaline Phosphatase: 133 U/L (ref 40–150)
Calcium: 9.9 mg/dL (ref 8.4–10.4)
Chloride: 101 mEq/L (ref 98–107)
Potassium: 4.1 mEq/L (ref 3.5–5.1)

## 2011-09-30 NOTE — Progress Notes (Signed)
[facility] [phone]) [phone]   [phone]  OFFICE PROGRESS NOTE  [name], MD [address] [place] [address]  DIAGNOSIS:  #1 non-Hodgkin's lymphoma diagnosed on bone marrow biopsy in June of 2012  #2 stage III (T3a N0M0) renal cell carcinoma diagnosed in June of 2012   PRIOR THERAPY:  1) Status post right nephrectomy under the care of Dr. Retta Diones on 09/02/2010.  2) Systemic chemotherapy with CHOP/Rituxan given every 3 weeks, status post 6 cycles.   CURRENT THERAPY: Observation.  INTERVAL HISTORY: Melissa Cross 78 y.o. female returns to the clinic today for followup visit accompanied by her son [name]. The patient is doing fine today with no specific complaints except for the persistent weight loss and mild fatigue. She was seen at the emergency Department few days ago for chest pain and no clear etiology was found. She was treated for GERD and discharged home. She denied having any other significant complaints today. She denied having any chest pain, shortness breath, cough or hemoptysis. She had repeat bone marrow biopsy and aspirate performed last week and the patient is here today for evaluation and discussion of the biopsy results and recommendation regarding her condition. Her neutrophil count was low on 09/26/2011 at the time of the bone marrow biopsy and the patient received one injection of Neupogen 300 mcg subcutaneously today.  MEDICAL HISTORY: Past Medical History  Diagnosis Date  . Renal cell carcinoma   . Hypertension   . Hypercholesteremia   . Non Hodgkin's lymphoma   . Glaucoma   . Arthritis   . Anemia   . Stroke     hx of TIA  . Blood transfusion   . Hypothyroidism   . Hypothyroid     on synthroid   . Cancer     kidney cancer, NHL   . Cancer     last chemo 11/04/10   . Thrush 12/17/2010    ALLERGIES:  is allergic to crab and dilaudid.  MEDICATIONS:  Current Outpatient Prescriptions  Medication  Sig Dispense Refill  . aspirin EC 81 MG tablet Take 81 mg by mouth at bedtime.       . Calcium Citrate (CITRACAL PO) Take 1 tablet by mouth daily.      . cholecalciferol (VITAMIN D) 400 UNITS TABS Take 400 Units by mouth daily.        . Coenzyme Q10 100 MG TABS Take 1 tablet by mouth daily.      . Garlic 1000 MG CAPS Take 1,000 mg by mouth daily.        . Ginger, Zingiber officinalis, (GINGER ROOT) 550 MG CAPS Take 550 mg by mouth daily.        [name] HYDROcodone-acetaminophen (NORCO) 5-325 MG per tablet Take 1 tablet by mouth every 6 (six) hours as needed for pain.  20 tablet  0  . levothyroxine (SYNTHROID, LEVOTHROID) 50 MCG tablet Take 50 mcg by mouth every morning.       . Multiple Vitamin (MULITIVITAMIN WITH MINERALS) TABS Take 1 tablet by mouth daily.        . nadolol (CORGARD) 40 MG tablet Take 40 mg by mouth at bedtime.       . pantoprazole (PROTONIX) 20 MG tablet Take 1 tablet (20 mg total) by mouth daily.  30 tablet  0  . simvastatin (ZOCOR) 20 MG tablet Take 7 mg by mouth at bedtime. She takes a third of a tablet.      [name]  vitamin B-12 (CYANOCOBALAMIN) 1000 MCG tablet Place 1,000 mcg under the tongue daily.       [name] UNABLE TO FIND Take 2,000 mg by mouth daily. Wheat grass tablets        SURGICAL HISTORY:  Past Surgical History  Procedure Date  . Nephrectomy     Right Side  . Tonsillectomy   . Portacath placement     right subclavian    REVIEW OF SYSTEMS:  A comprehensive review of systems was negative except for: Constitutional: positive for fatigue and weight loss   PHYSICAL EXAMINATION: General appearance: alert, cooperative and no distress Neck: no adenopathy Lymph nodes: Cervical, supraclavicular, and axillary nodes normal. Resp: clear to auscultation bilaterally Cardio: regular rate and rhythm, S1, S2 normal, no murmur, click, rub or gallop GI: soft, non-tender; bowel sounds normal; no masses,  no organomegaly Extremities: extremities normal, atraumatic, no cyanosis or  edema Neurologic: Alert and oriented X 3, normal strength and tone. Normal symmetric reflexes. Normal coordination and gait  ECOG PERFORMANCE STATUS: 1 - Symptomatic but completely ambulatory  Blood pressure 178/87, pulse 67, temperature 97.5 F (36.4 C), temperature source Oral, resp. rate 18, height 5\' 3"  (1.6 m), weight 113 lb 6.4 oz (51.438 kg).  LABORATORY DATA: Lab Results  Component Value Date   WBC 5.0 09/30/2011   HGB 13.3 09/30/2011   HCT 38.9 09/30/2011   MCV 100.2 09/30/2011   PLT 185 09/30/2011      Chemistry      Component Value Date/Time   NA 140 09/27/2011 1600   NA 140 09/09/2011 0908   K 3.7 09/27/2011 1600   K 4.4 09/09/2011 0908   CL 103 09/27/2011 1600   CL 100 09/09/2011 0908   CO2 29 09/27/2011 1600   CO2 31* 09/09/2011 0908   BUN 14 09/27/2011 1600   BUN 12.0 09/09/2011 0908   CREATININE 0.84 09/27/2011 1600   CREATININE 0.9 09/09/2011 0908      Component Value Date/Time   CALCIUM 9.5 09/27/2011 1600   CALCIUM 9.9 09/09/2011 0908   ALKPHOS 131 09/09/2011 0908   ALKPHOS 106 03/12/2011 0842   AST 30 09/09/2011 0908   AST 22 03/12/2011 0842   ALT 24 09/09/2011 0908   ALT 12 03/12/2011 0842   BILITOT 0.50 09/09/2011 0908   BILITOT 0.4 03/12/2011 0842       RADIOGRAPHIC STUDIES: Ct Abdomen Pelvis Wo Contrast  09/09/2011  *RADIOLOGY REPORT*  Clinical Data:  Restaging for lymphoma.  Diffuse large cell lymphoma.  Right renal cell carcinoma diagnosed in 2012.  CT CHEST, ABDOMEN AND PELVIS WITHOUT CONTRAST  Technique:  Multidetector CT imaging of the chest, abdomen and pelvis was performed following the standard protocol without IV contrast.  Comparison:  02/14/2011  CT CHEST  Findings:  Right chest wall Port-A-Cath.  Terminates near the junction of the superior vena cava and right atrium.  Mild cardiomegaly.  Coronary artery atherosclerotic calcification. Stable mildly ectatic thoracic aorta with scattered atherosclerotic calcification.  Normal thyroid gland.  Negative for supraclavicular,  axillary, mediastinal, or hilar lymphadenopathy.  Esophagus is unremarkable. Negative for pleural effusion.  Very tiny pericardial effusion noted anteriorly, new from prior study.  The trachea and mainstem bronchi are patent.  The lungs are well expanded and clear.  No nodules, masses, or airspace disease.  Soft tissues of the breasts are unremarkable by CT.  Thoracic spine vertebral bodies are normal in height alignment.  No acute or suspicious osseous abnormality.  IMPRESSION:  1.  No evidence  of lymphoma in the chest. 2.  Tiny pericardial effusion, new. 3.  Mild cardiomegaly, and atherosclerotic calcification of the coronary arteries and ectatic thoracic aorta.  CT ABDOMEN AND PELVIS  Findings:  Right nephrectomy.  Nephrectomy bed unremarkable. Stable 1.5 cm circumscribed low density lesion in the right hepatic lobe. Previously described left hepatic lobe low density lesion not definite visualized on today's study.  No new hepatic lesion or biliary ductal dilatation.  There is mild fullness of the left renal collecting system without definite hydronephrosis, stable compared to prior examination of December 2012.  The left kidney is normal in contour and size.  No evidence of renal mass on noncontrast imaging.  Negative for renal calculus.  The spleen, gallbladder, adrenal glands, and pancreas have normal noncontrast appearances.  Stable retroperitoneal soft tissue stranding in the aortocaval space on image number 62.  This likely reflects treated lymphoma.  No mesenteric or retroperitoneal abdominal or pelvic lymphadenopathy is identified.  Atherosclerotic calcification of the normal caliber abdominal aorta.  Left adnexal cyst measuring approximately 3.8 x 2.5 cm, with suggestion of a thin internal septation is stable in size.  Bowel loops are normal in caliber.  The appendix is normal. Urinary bladder is normal.  Faint high density within the central use could reflect some endometrial calcifications or calcified  fibroids.  Urinary bladder has normal appearances.  Negative for ascites.  Stable dextroscoliosis of the lumbar spine.  Vertebral bodies are normal in height.  Grade 1 retrolisthesis of L2 on L3 is stable, with marked degenerative disc disease at L2-3 and moderate degenerative disc disease at L3-4 L5 S1.  No acute or suspicious bony abnormality is identified.  IMPRESSION:  1.  Stable examination.  Negative for abdominal or pelvic lymphadenopathy.  Stable stranding in the aortocaval space of the retroperitoneum likely reflects treated lymphoma. 2.  Stable approximately 4 cm left adnexal cystic mass. This area was not hypermetabolic on prior PET CT. 3.  Right nephrectomy. 4.  Stable circumscribed probable right hepatic lobe cyst. Previously described left hepatic lobe low density lesion not definitely visualized.   Original Report Authenticated By: Britta Mccreedy, M.D.    Ct Chest Wo Contrast  09/09/2011  *RADIOLOGY REPORT*  Clinical Data:  Restaging for lymphoma.  Diffuse large cell lymphoma.  Right renal cell carcinoma diagnosed in 2012.  CT CHEST, ABDOMEN AND PELVIS WITHOUT CONTRAST  Technique:  Multidetector CT imaging of the chest, abdomen and pelvis was performed following the standard protocol without IV contrast.  Comparison:  02/14/2011  CT CHEST  Findings:  Right chest wall Port-A-Cath.  Terminates near the junction of the superior vena cava and right atrium.  Mild cardiomegaly.  Coronary artery atherosclerotic calcification. Stable mildly ectatic thoracic aorta with scattered atherosclerotic calcification.  Normal thyroid gland.  Negative for supraclavicular, axillary, mediastinal, or hilar lymphadenopathy.  Esophagus is unremarkable. Negative for pleural effusion.  Very tiny pericardial effusion noted anteriorly, new from prior study.  The trachea and mainstem bronchi are patent.  The lungs are well expanded and clear.  No nodules, masses, or airspace disease.  Soft tissues of the breasts are unremarkable  by CT.  Thoracic spine vertebral bodies are normal in height alignment.  No acute or suspicious osseous abnormality.  IMPRESSION:  1.  No evidence of lymphoma in the chest. 2.  Tiny pericardial effusion, new. 3.  Mild cardiomegaly, and atherosclerotic calcification of the coronary arteries and ectatic thoracic aorta.  CT ABDOMEN AND PELVIS  Findings:  Right nephrectomy.  Nephrectomy bed  unremarkable. Stable 1.5 cm circumscribed low density lesion in the right hepatic lobe. Previously described left hepatic lobe low density lesion not definite visualized on today's study.  No new hepatic lesion or biliary ductal dilatation.  There is mild fullness of the left renal collecting system without definite hydronephrosis, stable compared to prior examination of December 2012.  The left kidney is normal in contour and size.  No evidence of renal mass on noncontrast imaging.  Negative for renal calculus.  The spleen, gallbladder, adrenal glands, and pancreas have normal noncontrast appearances.  Stable retroperitoneal soft tissue stranding in the aortocaval space on image number 62.  This likely reflects treated lymphoma.  No mesenteric or retroperitoneal abdominal or pelvic lymphadenopathy is identified.  Atherosclerotic calcification of the normal caliber abdominal aorta.  Left adnexal cyst measuring approximately 3.8 x 2.5 cm, with suggestion of a thin internal septation is stable in size.  Bowel loops are normal in caliber.  The appendix is normal. Urinary bladder is normal.  Faint high density within the central use could reflect some endometrial calcifications or calcified fibroids.  Urinary bladder has normal appearances.  Negative for ascites.  Stable dextroscoliosis of the lumbar spine.  Vertebral bodies are normal in height.  Grade 1 retrolisthesis of L2 on L3 is stable, with marked degenerative disc disease at L2-3 and moderate degenerative disc disease at L3-4 L5 S1.  No acute or suspicious bony abnormality is  identified.  IMPRESSION:  1.  Stable examination.  Negative for abdominal or pelvic lymphadenopathy.  Stable stranding in the aortocaval space of the retroperitoneum likely reflects treated lymphoma. 2.  Stable approximately 4 cm left adnexal cystic mass. This area was not hypermetabolic on prior PET CT. 3.  Right nephrectomy. 4.  Stable circumscribed probable right hepatic lobe cyst. Previously described left hepatic lobe low density lesion not definitely visualized.   Original Report Authenticated By: Britta Mccreedy, M.D.    Dg Chest Port 1 View  09/27/2011  *RADIOLOGY REPORT*  Clinical Data: Pain, history of Hodgkin's lymphoma  PORTABLE CHEST - 1 VIEW  Comparison: 09/09/2011  Findings: Cardiomediastinal silhouette is unremarkable.  No acute infiltrate or pulmonary edema.  There is right IJ Port-A-Cath with tip in SVC right atrium junction. No diagnostic pneumothorax.  IMPRESSION: No active disease.  No significant change.   Original Report Authenticated By: Natasha Mead, M.D.     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 The patient had a recent bone marrow biopsy and aspirate. The final report is still pending but discussion with Dr. Laureen Ochs from pathology indicate no evidence for lymphoma recurrence. The patient also has recovery of her leukocyte count as well as neutrophils after the single Neupogen injection.  PLAN: I discussed the lab and biopsy results with the patient and her son. I recommended for her to continue on observation for now with repeat CT scan of the chest, abdomen and pelvis in 6 months. I also advised the patient to have monthly CBC to evaluate her neutropenia.  She was advised to call me immediately if she has any concerning symptoms in the  interval.  All questions were answered. The patient knows to call the clinic with any problems, questions or concerns. We can certainly see the patient much sooner if necessary.

## 2011-09-30 NOTE — Patient Instructions (Signed)
Bone marrow biopsy was unremarkable for lymphoma recurrence. Followup in 6 months with repeat CT scan of the chest, abdomen and pelvis. Monthly CBC

## 2011-09-30 NOTE — Telephone Encounter (Signed)
Printed and gv pt appt schedule for oct-march 2014...Marland KitchenMarland KitchenMarland Kitchensed

## 2011-10-07 ENCOUNTER — Telehealth: Payer: Self-pay | Admitting: Medical Oncology

## 2011-10-07 NOTE — Telephone Encounter (Signed)
Per Dr Arbutus Ped the full report is what i told her at last appt . I told pt she can get a flu vaccine

## 2011-10-07 NOTE — Telephone Encounter (Signed)
Asking if full report back from bone marrow

## 2011-10-08 ENCOUNTER — Ambulatory Visit: Payer: Medicare Other | Admitting: Internal Medicine

## 2011-10-08 ENCOUNTER — Other Ambulatory Visit: Payer: Medicare Other | Admitting: Lab

## 2011-10-30 ENCOUNTER — Other Ambulatory Visit (HOSPITAL_BASED_OUTPATIENT_CLINIC_OR_DEPARTMENT_OTHER): Payer: Medicare Other | Admitting: Lab

## 2011-10-30 DIAGNOSIS — C8590 Non-Hodgkin lymphoma, unspecified, unspecified site: Secondary | ICD-10-CM

## 2011-10-30 DIAGNOSIS — C8589 Other specified types of non-Hodgkin lymphoma, extranodal and solid organ sites: Secondary | ICD-10-CM

## 2011-10-30 DIAGNOSIS — C8330 Diffuse large B-cell lymphoma, unspecified site: Secondary | ICD-10-CM

## 2011-10-30 LAB — CBC WITH DIFFERENTIAL/PLATELET
Basophils Absolute: 0 10*3/uL (ref 0.0–0.1)
EOS%: 2.6 % (ref 0.0–7.0)
HCT: 39.8 % (ref 34.8–46.6)
HGB: 13.7 g/dL (ref 11.6–15.9)
MCH: 34.6 pg — ABNORMAL HIGH (ref 25.1–34.0)
MONO#: 0.3 10*3/uL (ref 0.1–0.9)
NEUT#: 4.1 10*3/uL (ref 1.5–6.5)
NEUT%: 77.7 % — ABNORMAL HIGH (ref 38.4–76.8)
RDW: 14.6 % — ABNORMAL HIGH (ref 11.2–14.5)
WBC: 5.3 10*3/uL (ref 3.9–10.3)
lymph#: 0.7 10*3/uL — ABNORMAL LOW (ref 0.9–3.3)

## 2011-10-30 LAB — COMPREHENSIVE METABOLIC PANEL (CC13)
ALT: 21 U/L (ref 0–55)
AST: 19 U/L (ref 5–34)
Albumin: 3.8 g/dL (ref 3.5–5.0)
BUN: 17 mg/dL (ref 7.0–26.0)
CO2: 29 mEq/L (ref 22–29)
Calcium: 10 mg/dL (ref 8.4–10.4)
Chloride: 102 mEq/L (ref 98–107)
Creatinine: 0.8 mg/dL (ref 0.6–1.1)
Potassium: 3.9 mEq/L (ref 3.5–5.1)

## 2011-11-01 DIAGNOSIS — Z23 Encounter for immunization: Secondary | ICD-10-CM | POA: Diagnosis not present

## 2011-11-11 DIAGNOSIS — C649 Malignant neoplasm of unspecified kidney, except renal pelvis: Secondary | ICD-10-CM | POA: Diagnosis not present

## 2011-11-13 ENCOUNTER — Telehealth: Payer: Self-pay | Admitting: *Deleted

## 2011-11-13 NOTE — Telephone Encounter (Signed)
[facility] note from Dr. Retta Diones given to Dr Donnald Garre to review.  Note indicates that Dr Donnald Garre will now oversee lymphoma as well as renal cell carcinoma and Dr. Retta Diones will see pt on PRN basis.  SLJ

## 2011-12-01 ENCOUNTER — Ambulatory Visit (HOSPITAL_BASED_OUTPATIENT_CLINIC_OR_DEPARTMENT_OTHER): Payer: Medicare Other

## 2011-12-01 ENCOUNTER — Other Ambulatory Visit (HOSPITAL_BASED_OUTPATIENT_CLINIC_OR_DEPARTMENT_OTHER): Payer: Medicare Other | Admitting: Lab

## 2011-12-01 VITALS — BP 161/73 | HR 59 | Temp 98.3°F | Resp 20

## 2011-12-01 DIAGNOSIS — C8589 Other specified types of non-Hodgkin lymphoma, extranodal and solid organ sites: Secondary | ICD-10-CM

## 2011-12-01 DIAGNOSIS — C8590 Non-Hodgkin lymphoma, unspecified, unspecified site: Secondary | ICD-10-CM

## 2011-12-01 DIAGNOSIS — Z452 Encounter for adjustment and management of vascular access device: Secondary | ICD-10-CM

## 2011-12-01 LAB — CBC WITH DIFFERENTIAL/PLATELET
BASO%: 0.5 % (ref 0.0–2.0)
EOS%: 3.1 % (ref 0.0–7.0)
HGB: 13.4 g/dL (ref 11.6–15.9)
MCH: 33.9 pg (ref 25.1–34.0)
MCHC: 34.3 g/dL (ref 31.5–36.0)
MONO#: 0.5 10*3/uL (ref 0.1–0.9)
RDW: 14.8 % — ABNORMAL HIGH (ref 11.2–14.5)
WBC: 4.7 10*3/uL (ref 3.9–10.3)
lymph#: 0.6 10*3/uL — ABNORMAL LOW (ref 0.9–3.3)

## 2011-12-01 MED ORDER — SODIUM CHLORIDE 0.9 % IJ SOLN
10.0000 mL | INTRAMUSCULAR | Status: DC | PRN
  Administered 2011-12-01: 10 mL via INTRAVENOUS
  Filled 2011-12-01: qty 10

## 2011-12-01 MED ORDER — HEPARIN SOD (PORK) LOCK FLUSH 100 UNIT/ML IV SOLN
500.0000 [IU] | Freq: Once | INTRAVENOUS | Status: AC
  Administered 2011-12-01: 500 [IU] via INTRAVENOUS
  Filled 2011-12-01: qty 5

## 2011-12-30 ENCOUNTER — Other Ambulatory Visit (HOSPITAL_BASED_OUTPATIENT_CLINIC_OR_DEPARTMENT_OTHER): Payer: Medicare Other | Admitting: Lab

## 2011-12-30 DIAGNOSIS — D709 Neutropenia, unspecified: Secondary | ICD-10-CM

## 2011-12-30 DIAGNOSIS — C8589 Other specified types of non-Hodgkin lymphoma, extranodal and solid organ sites: Secondary | ICD-10-CM | POA: Diagnosis not present

## 2011-12-30 DIAGNOSIS — C649 Malignant neoplasm of unspecified kidney, except renal pelvis: Secondary | ICD-10-CM

## 2011-12-30 DIAGNOSIS — C8330 Diffuse large B-cell lymphoma, unspecified site: Secondary | ICD-10-CM

## 2011-12-30 LAB — CBC WITH DIFFERENTIAL/PLATELET
Basophils Absolute: 0 10*3/uL (ref 0.0–0.1)
EOS%: 2.6 % (ref 0.0–7.0)
Eosinophils Absolute: 0.1 10*3/uL (ref 0.0–0.5)
HCT: 40.1 % (ref 34.8–46.6)
HGB: 13.8 g/dL (ref 11.6–15.9)
MONO#: 0.3 10*3/uL (ref 0.1–0.9)
NEUT#: 2.8 10*3/uL (ref 1.5–6.5)
RDW: 14.6 % — ABNORMAL HIGH (ref 11.2–14.5)
lymph#: 0.6 10*3/uL — ABNORMAL LOW (ref 0.9–3.3)

## 2011-12-30 LAB — COMPREHENSIVE METABOLIC PANEL (CC13)
ALT: 23 U/L (ref 0–55)
Alkaline Phosphatase: 139 U/L (ref 40–150)
Creatinine: 1 mg/dL (ref 0.6–1.1)
Potassium: 3.9 mEq/L (ref 3.5–5.1)
Total Bilirubin: 0.54 mg/dL (ref 0.20–1.20)
Total Protein: 6 g/dL — ABNORMAL LOW (ref 6.4–8.3)

## 2012-01-27 ENCOUNTER — Telehealth: Payer: Self-pay | Admitting: *Deleted

## 2012-01-27 NOTE — Telephone Encounter (Signed)
I have called patient and moved appts to later in the morning due to a meeting.  JMW

## 2012-01-29 ENCOUNTER — Telehealth: Payer: Self-pay | Admitting: Medical Oncology

## 2012-01-29 NOTE — Telephone Encounter (Signed)
confirmed appts  

## 2012-01-30 ENCOUNTER — Ambulatory Visit (HOSPITAL_BASED_OUTPATIENT_CLINIC_OR_DEPARTMENT_OTHER): Payer: Medicare Other

## 2012-01-30 ENCOUNTER — Other Ambulatory Visit: Payer: Medicare Other

## 2012-01-30 VITALS — BP 155/65 | HR 64 | Temp 98.2°F | Resp 16

## 2012-01-30 DIAGNOSIS — Z452 Encounter for adjustment and management of vascular access device: Secondary | ICD-10-CM | POA: Diagnosis not present

## 2012-01-30 DIAGNOSIS — C649 Malignant neoplasm of unspecified kidney, except renal pelvis: Secondary | ICD-10-CM

## 2012-01-30 DIAGNOSIS — D709 Neutropenia, unspecified: Secondary | ICD-10-CM

## 2012-01-30 DIAGNOSIS — C8589 Other specified types of non-Hodgkin lymphoma, extranodal and solid organ sites: Secondary | ICD-10-CM | POA: Diagnosis not present

## 2012-01-30 DIAGNOSIS — C8330 Diffuse large B-cell lymphoma, unspecified site: Secondary | ICD-10-CM

## 2012-01-30 DIAGNOSIS — C8590 Non-Hodgkin lymphoma, unspecified, unspecified site: Secondary | ICD-10-CM

## 2012-01-30 LAB — CBC WITH DIFFERENTIAL/PLATELET
BASO%: 0.3 % (ref 0.0–2.0)
EOS%: 2 % (ref 0.0–7.0)
HCT: 39.9 % (ref 34.8–46.6)
LYMPH%: 10.3 % — ABNORMAL LOW (ref 14.0–49.7)
MCH: 33.4 pg (ref 25.1–34.0)
MCHC: 34.5 g/dL (ref 31.5–36.0)
MCV: 96.9 fL (ref 79.5–101.0)
MONO%: 7.3 % (ref 0.0–14.0)
NEUT%: 80.1 % — ABNORMAL HIGH (ref 38.4–76.8)
Platelets: 210 10*3/uL (ref 145–400)
RBC: 4.11 10*6/uL (ref 3.70–5.45)

## 2012-01-30 MED ORDER — SODIUM CHLORIDE 0.9 % IJ SOLN
10.0000 mL | INTRAMUSCULAR | Status: DC | PRN
  Administered 2012-01-30: 10 mL via INTRAVENOUS
  Filled 2012-01-30: qty 10

## 2012-01-30 MED ORDER — HEPARIN SOD (PORK) LOCK FLUSH 100 UNIT/ML IV SOLN
500.0000 [IU] | Freq: Once | INTRAVENOUS | Status: AC
  Administered 2012-01-30: 500 [IU] via INTRAVENOUS
  Filled 2012-01-30: qty 5

## 2012-02-17 DIAGNOSIS — Z79899 Other long term (current) drug therapy: Secondary | ICD-10-CM | POA: Diagnosis not present

## 2012-02-17 DIAGNOSIS — E039 Hypothyroidism, unspecified: Secondary | ICD-10-CM | POA: Diagnosis not present

## 2012-02-17 DIAGNOSIS — I10 Essential (primary) hypertension: Secondary | ICD-10-CM | POA: Diagnosis not present

## 2012-02-17 DIAGNOSIS — E785 Hyperlipidemia, unspecified: Secondary | ICD-10-CM | POA: Diagnosis not present

## 2012-03-01 ENCOUNTER — Other Ambulatory Visit (HOSPITAL_BASED_OUTPATIENT_CLINIC_OR_DEPARTMENT_OTHER): Payer: Medicare Other | Admitting: Lab

## 2012-03-01 DIAGNOSIS — C8589 Other specified types of non-Hodgkin lymphoma, extranodal and solid organ sites: Secondary | ICD-10-CM | POA: Diagnosis not present

## 2012-03-01 DIAGNOSIS — C649 Malignant neoplasm of unspecified kidney, except renal pelvis: Secondary | ICD-10-CM

## 2012-03-01 DIAGNOSIS — D709 Neutropenia, unspecified: Secondary | ICD-10-CM | POA: Diagnosis not present

## 2012-03-01 LAB — CBC WITH DIFFERENTIAL/PLATELET
BASO%: 0.3 % (ref 0.0–2.0)
EOS%: 2.7 % (ref 0.0–7.0)
HCT: 42 % (ref 34.8–46.6)
MCH: 33.1 pg (ref 25.1–34.0)
MCHC: 33.6 g/dL (ref 31.5–36.0)
NEUT%: 68 % (ref 38.4–76.8)
RBC: 4.26 10*6/uL (ref 3.70–5.45)
RDW: 14.1 % (ref 11.2–14.5)
lymph#: 0.7 10*3/uL — ABNORMAL LOW (ref 0.9–3.3)
nRBC: 0 % (ref 0–0)

## 2012-03-17 ENCOUNTER — Other Ambulatory Visit: Payer: Self-pay | Admitting: Medical Oncology

## 2012-03-25 ENCOUNTER — Telehealth: Payer: Self-pay | Admitting: Internal Medicine

## 2012-03-25 ENCOUNTER — Other Ambulatory Visit: Payer: Self-pay | Admitting: *Deleted

## 2012-03-26 ENCOUNTER — Telehealth: Payer: Self-pay | Admitting: Internal Medicine

## 2012-03-29 ENCOUNTER — Ambulatory Visit (HOSPITAL_COMMUNITY): Payer: Medicare Other

## 2012-03-31 ENCOUNTER — Other Ambulatory Visit: Payer: Medicare Other | Admitting: Lab

## 2012-03-31 ENCOUNTER — Ambulatory Visit (HOSPITAL_COMMUNITY): Payer: Medicare Other

## 2012-04-01 ENCOUNTER — Other Ambulatory Visit (HOSPITAL_BASED_OUTPATIENT_CLINIC_OR_DEPARTMENT_OTHER): Payer: Medicare Other | Admitting: Lab

## 2012-04-01 ENCOUNTER — Ambulatory Visit (HOSPITAL_BASED_OUTPATIENT_CLINIC_OR_DEPARTMENT_OTHER): Payer: Medicare Other

## 2012-04-01 ENCOUNTER — Ambulatory Visit (HOSPITAL_COMMUNITY)
Admission: RE | Admit: 2012-04-01 | Discharge: 2012-04-01 | Disposition: A | Discharge status: Home / Self Care | Payer: Medicare Other | Source: Ambulatory Visit | Attending: Internal Medicine | Admitting: Internal Medicine

## 2012-04-01 ENCOUNTER — Encounter (HOSPITAL_COMMUNITY): Payer: Self-pay

## 2012-04-01 VITALS — Temp 97.4°F

## 2012-04-01 DIAGNOSIS — Q619 Cystic kidney disease, unspecified: Secondary | ICD-10-CM | POA: Diagnosis not present

## 2012-04-01 DIAGNOSIS — C649 Malignant neoplasm of unspecified kidney, except renal pelvis: Secondary | ICD-10-CM | POA: Insufficient documentation

## 2012-04-01 DIAGNOSIS — C8590 Non-Hodgkin lymphoma, unspecified, unspecified site: Secondary | ICD-10-CM

## 2012-04-01 DIAGNOSIS — Z452 Encounter for adjustment and management of vascular access device: Secondary | ICD-10-CM

## 2012-04-01 DIAGNOSIS — C8330 Diffuse large B-cell lymphoma, unspecified site: Secondary | ICD-10-CM

## 2012-04-01 DIAGNOSIS — R911 Solitary pulmonary nodule: Secondary | ICD-10-CM | POA: Insufficient documentation

## 2012-04-01 DIAGNOSIS — D709 Neutropenia, unspecified: Secondary | ICD-10-CM

## 2012-04-01 DIAGNOSIS — C8589 Other specified types of non-Hodgkin lymphoma, extranodal and solid organ sites: Secondary | ICD-10-CM | POA: Diagnosis not present

## 2012-04-01 DIAGNOSIS — N281 Cyst of kidney, acquired: Secondary | ICD-10-CM | POA: Diagnosis not present

## 2012-04-01 LAB — CBC WITH DIFFERENTIAL/PLATELET
EOS%: 2.9 % (ref 0.0–7.0)
Eosinophils Absolute: 0.2 10*3/uL (ref 0.0–0.5)
MCV: 96.8 fL (ref 79.5–101.0)
MONO%: 8.6 % (ref 0.0–14.0)
NEUT#: 4.4 10*3/uL (ref 1.5–6.5)
RBC: 4.12 10*6/uL (ref 3.70–5.45)
RDW: 14.7 % — ABNORMAL HIGH (ref 11.2–14.5)

## 2012-04-01 LAB — COMPREHENSIVE METABOLIC PANEL (CC13)
AST: 21 U/L (ref 5–34)
Albumin: 3.6 g/dL (ref 3.5–5.0)
Alkaline Phosphatase: 122 U/L (ref 40–150)
Glucose: 88 mg/dl (ref 70–99)
Potassium: 4.1 mEq/L (ref 3.5–5.1)
Sodium: 141 mEq/L (ref 136–145)
Total Protein: 6.3 g/dL — ABNORMAL LOW (ref 6.4–8.3)

## 2012-04-01 MED ORDER — IOHEXOL 300 MG/ML  SOLN
100.0000 mL | Freq: Once | INTRAMUSCULAR | Status: AC | PRN
  Administered 2012-04-01: 100 mL via INTRAVENOUS

## 2012-04-01 MED ORDER — SODIUM CHLORIDE 0.9 % IJ SOLN
10.0000 mL | INTRAMUSCULAR | Status: DC | PRN
  Administered 2012-04-01: 10 mL via INTRAVENOUS
  Filled 2012-04-01: qty 10

## 2012-04-01 MED ORDER — HEPARIN SOD (PORK) LOCK FLUSH 100 UNIT/ML IV SOLN
500.0000 [IU] | Freq: Once | INTRAVENOUS | Status: AC
  Administered 2012-04-01: 500 [IU] via INTRAVENOUS
  Filled 2012-04-01: qty 5

## 2012-04-05 ENCOUNTER — Other Ambulatory Visit: Payer: Medicare Other | Admitting: Lab

## 2012-04-05 ENCOUNTER — Ambulatory Visit (HOSPITAL_BASED_OUTPATIENT_CLINIC_OR_DEPARTMENT_OTHER): Payer: Medicare Other | Admitting: Internal Medicine

## 2012-04-05 ENCOUNTER — Telehealth: Payer: Self-pay | Admitting: Internal Medicine

## 2012-04-05 ENCOUNTER — Encounter: Payer: Self-pay | Admitting: Internal Medicine

## 2012-04-05 VITALS — BP 150/81 | HR 65 | Temp 97.1°F | Resp 20 | Ht 63.0 in | Wt 114.9 lb

## 2012-04-05 DIAGNOSIS — C649 Malignant neoplasm of unspecified kidney, except renal pelvis: Secondary | ICD-10-CM

## 2012-04-05 DIAGNOSIS — C641 Malignant neoplasm of right kidney, except renal pelvis: Secondary | ICD-10-CM

## 2012-04-05 DIAGNOSIS — C8330 Diffuse large B-cell lymphoma, unspecified site: Secondary | ICD-10-CM

## 2012-04-05 DIAGNOSIS — C8589 Other specified types of non-Hodgkin lymphoma, extranodal and solid organ sites: Secondary | ICD-10-CM

## 2012-04-05 NOTE — Telephone Encounter (Signed)
Gave pt appt for lab and MD for June 2014, Ct will be scheduled by Radiology

## 2012-04-05 NOTE — Progress Notes (Signed)
[facility] [phone]   Fax:([phone]  OFFICE PROGRESS NOTE  [name], MD [address] [place] [address]  DIAGNOSIS:  #1 non-Hodgkin's lymphoma diagnosed on bone marrow biopsy in June of 2012  #2 stage III (T3a N0M0) renal cell carcinoma diagnosed in June of 2012   PRIOR THERAPY:  1) Status post right nephrectomy under the care of Dr. Retta Diones on 09/02/2010.  2) Systemic chemotherapy with CHOP/Rituxan given every 3 weeks, status post 6 cycles.   CURRENT THERAPY: Observation.  INTERVAL HISTORY: Melissa Cross 80 y.o. female returns to the clinic today for followup visit accompanied her son. The patient is feeling fine today with no specific complaints. She denied having any significant chest pain, shortness breath, cough or hemoptysis. She has no weight loss or night sweats. The patient had repeat CT scan of the chest, abdomen and pelvis performed recently and she is here for evaluation and discussion of her scan results.  MEDICAL HISTORY: Past Medical History  Diagnosis Date  . Renal cell carcinoma   . Hypertension   . Hypercholesteremia   . Non Hodgkin's lymphoma   . Glaucoma   . Arthritis   . Anemia   . Stroke     hx of TIA  . Blood transfusion   . Hypothyroidism   . Hypothyroid     on synthroid   . Cancer     kidney cancer, NHL   . Cancer     last chemo 11/04/10   . Thrush 12/17/2010    ALLERGIES:  is allergic to crab and dilaudid.  MEDICATIONS:  Current Outpatient Prescriptions  Medication Sig Dispense Refill  . aspirin EC 81 MG tablet Take 81 mg by mouth at bedtime.       . Calcium Citrate (CITRACAL PO) Take 1 tablet by mouth 3 (three) times a week.       . cholecalciferol (VITAMIN D) 400 UNITS TABS Take 400 Units by mouth daily.        . Coenzyme Q10 100 MG TABS Take 1 tablet by mouth daily.      . Garlic 1000 MG CAPS Take 1,000 mg by mouth daily.        . Ginger, Zingiber officinalis, (GINGER  ROOT) 550 MG CAPS Take 550 mg by mouth daily.        [name] levothyroxine (SYNTHROID, LEVOTHROID) 50 MCG tablet Take 50 mcg by mouth every morning.       . Multiple Vitamin (MULITIVITAMIN WITH MINERALS) TABS Take 1 tablet by mouth daily.        . nadolol (CORGARD) 40 MG tablet Take 40 mg by mouth at bedtime.       . simvastatin (ZOCOR) 20 MG tablet Take 7 mg by mouth at bedtime. She takes a third of a tablet.      [name] UNABLE TO FIND Take 2,000 mg by mouth daily. Wheat grass tablets      . vitamin B-12 (CYANOCOBALAMIN) 1000 MCG tablet Place 1,000 mcg under the tongue daily.        No current facility-administered medications for this visit.    SURGICAL HISTORY:  Past Surgical History  Procedure Laterality Date  . Nephrectomy      Right Side  . Tonsillectomy    . Portacath placement      right subclavian    REVIEW OF SYSTEMS:  A comprehensive review of systems was negative.   PHYSICAL EXAMINATION: General appearance: alert, cooperative and  no distress Head: Normocephalic, without obvious abnormality, atraumatic Neck: no adenopathy Resp: clear to auscultation bilaterally Cardio: regular rate and rhythm, S1, S2 normal, no murmur, click, rub or gallop GI: soft, non-tender; bowel sounds normal; no masses,  no organomegaly Extremities: extremities normal, atraumatic, no cyanosis or edema  ECOG PERFORMANCE STATUS: 1 - Symptomatic but completely ambulatory  Blood pressure 150/81, pulse 65, temperature 97.1 F (36.2 C), temperature source Oral, resp. rate 20, height 5\' 3"  (1.6 m), weight 114 lb 14.4 oz (52.118 kg).  LABORATORY DATA: Lab Results  Component Value Date   WBC 5.8 04/01/2012   HGB 13.6 04/01/2012   HCT 39.8 04/01/2012   MCV 96.8 04/01/2012   PLT 197 04/01/2012      Chemistry      Component Value Date/Time   NA 141 04/01/2012 0805   NA 140 09/27/2011 1600   K 4.1 04/01/2012 0805   K 3.7 09/27/2011 1600   CL 102 04/01/2012 0805   CL 103 09/27/2011 1600   CO2 29 04/01/2012 0805    CO2 29 09/27/2011 1600   BUN 16.0 04/01/2012 0805   BUN 14 09/27/2011 1600   CREATININE 0.9 04/01/2012 0805   CREATININE 0.84 09/27/2011 1600      Component Value Date/Time   CALCIUM 9.6 04/01/2012 0805   CALCIUM 9.5 09/27/2011 1600   ALKPHOS 122 04/01/2012 0805   ALKPHOS 106 03/12/2011 0842   AST 21 04/01/2012 0805   AST 22 03/12/2011 0842   ALT 19 04/01/2012 0805   ALT 12 03/12/2011 0842   BILITOT 0.54 04/01/2012 0805   BILITOT 0.4 03/12/2011 0842       RADIOGRAPHIC STUDIES: Ct Chest W Contrast  04/01/2012  *RADIOLOGY REPORT*  Clinical Data:  Follow up lymphoma  CT CHEST, ABDOMEN AND PELVIS WITH CONTRAST  Technique:  Multidetector CT imaging of the chest, abdomen and pelvis was performed following the standard protocol during bolus administration of intravenous contrast.  Contrast: OMNIPAQUE IOHEXOL 300 MG/ML  SOLN  Comparison:  09/09/2011   CT CHEST  Findings:  Lungs/pleura: No pleural effusion.  Right middle lobe subpleural nodule measures 7 x 11 mm, image 36/series 4.  This is new from previous exam.  No additional pulmonary nodules or masses identified.  Heart/Mediastinum: Normal heart size.  No enlarged mediastinal or hilar lymph nodes.  No pericardial effusion identified.  Bones/Musculoskeletal:  No enlarged axillary or supraclavicular lymph nodes.  There are no aggressive lytic or sclerotic bone lesions identified.  IMPRESSION:  1.  No acute findings. 2.  New subpleural nodule in the right middle lobe.  Consider further evaluation with PET CT. Alternatively short-term follow-up imaging in 3 months would be advised. 3.  No evidence for residual or recurrent adenopathy within the chest.    ABDOMEN AND PELVIS  Findings:  Stable low attenuation structure within the right hepatic lobe measuring 1.3 cm, image number 57/series 2. Gallbladder is normal.  There is mild increased caliber of the common bile duct which measures 7 mm, image 67/series 2. Pancreatic duct is normal.  The pancreas is  unremarkable.  Normal appearance of the spleen.  The adrenal glands are both normal.  There is a small cyst identified within the inferior pole of the left kidney measuring 7 mm, image 67/series 2.  Absent right kidney.  The urinary bladder appears within normal limits.  The uterus appears normal. Septated cyst noted within the left ovary measures 3.8 cm, image 87/series 2. This appears unchanged from previous exam.  Normal  caliber of the abdominal aorta.  There is calcified atherosclerotic disease affecting the aorta.  There is no upper abdominal adenopathy.  No pelvic or inguinal adenopathy identified.  Review of the visualized osseous structures is significant for mild scoliosis and multilevel degenerative disc disease.  IMPRESSION:  1.  No acute findings within the abdomen or pelvis. 2.  No evidence for mass or adenopathy. 3.  Stable septated cystic structure within the left adnexa.   Original Report Authenticated By: Signa Kell, M.D.    ASSESSMENT: This is a very pleasant 79 years old white female with history of non-Hodgkin lymphoma based on bone marrow biopsy status post treatment with CHOP/Rituxan for 6 cycles in addition to history of stage IIIB metastatic carcinoma status post right nephrectomy. The patient has been observation with no evidence for disease recurrence but there was a questionable subpleural nodule in the right middle lobe.   PLAN: I discussed the scan results with the patient and her son. I recommended for her to continue on observation with repeat CT scan of the chest in 3 months for evaluation of the right middle lobe subpleural nodule.  She was advised to call immediately she has any concerning symptoms in the interval.  All questions were answered. The patient knows to call the clinic with any problems, questions or concerns. We can certainly see the patient much sooner if necessary.

## 2012-04-05 NOTE — Patient Instructions (Signed)
No evidence for disease recurrence except for subpleural nodule in the right middle lobe of the lung. Repeat CT scan of the chest in 3 months for evaluation of this nodule.

## 2012-05-03 DIAGNOSIS — H2510 Age-related nuclear cataract, unspecified eye: Secondary | ICD-10-CM | POA: Diagnosis not present

## 2012-05-03 DIAGNOSIS — H5200 Hypermetropia, unspecified eye: Secondary | ICD-10-CM | POA: Diagnosis not present

## 2012-05-17 ENCOUNTER — Ambulatory Visit (HOSPITAL_BASED_OUTPATIENT_CLINIC_OR_DEPARTMENT_OTHER): Payer: Medicare Other

## 2012-05-17 VITALS — BP 155/83 | HR 59 | Temp 97.2°F

## 2012-05-17 DIAGNOSIS — Z452 Encounter for adjustment and management of vascular access device: Secondary | ICD-10-CM

## 2012-05-17 DIAGNOSIS — C8590 Non-Hodgkin lymphoma, unspecified, unspecified site: Secondary | ICD-10-CM

## 2012-05-17 DIAGNOSIS — C8589 Other specified types of non-Hodgkin lymphoma, extranodal and solid organ sites: Secondary | ICD-10-CM

## 2012-05-17 MED ORDER — HEPARIN SOD (PORK) LOCK FLUSH 100 UNIT/ML IV SOLN
500.0000 [IU] | Freq: Once | INTRAVENOUS | Status: AC
  Administered 2012-05-17: 500 [IU] via INTRAVENOUS
  Filled 2012-05-17: qty 5

## 2012-05-17 MED ORDER — SODIUM CHLORIDE 0.9 % IJ SOLN
10.0000 mL | INTRAMUSCULAR | Status: AC | PRN
  Administered 2012-05-17: 10 mL via INTRAVENOUS
  Filled 2012-05-17: qty 10

## 2012-05-18 DIAGNOSIS — H5310 Unspecified subjective visual disturbances: Secondary | ICD-10-CM | POA: Diagnosis not present

## 2012-05-25 DIAGNOSIS — H2510 Age-related nuclear cataract, unspecified eye: Secondary | ICD-10-CM | POA: Diagnosis not present

## 2012-05-25 DIAGNOSIS — H5319 Other subjective visual disturbances: Secondary | ICD-10-CM | POA: Diagnosis not present

## 2012-05-27 DIAGNOSIS — H2510 Age-related nuclear cataract, unspecified eye: Secondary | ICD-10-CM | POA: Diagnosis not present

## 2012-05-27 DIAGNOSIS — H2589 Other age-related cataract: Secondary | ICD-10-CM | POA: Diagnosis not present

## 2012-07-02 ENCOUNTER — Encounter (HOSPITAL_COMMUNITY): Payer: Self-pay

## 2012-07-02 ENCOUNTER — Other Ambulatory Visit (HOSPITAL_BASED_OUTPATIENT_CLINIC_OR_DEPARTMENT_OTHER): Payer: Medicare Other | Admitting: Lab

## 2012-07-02 ENCOUNTER — Ambulatory Visit (HOSPITAL_COMMUNITY)
Admission: RE | Admit: 2012-07-02 | Discharge: 2012-07-02 | Disposition: A | Discharge status: Home / Self Care | Payer: Medicare Other | Source: Ambulatory Visit | Attending: Internal Medicine | Admitting: Internal Medicine

## 2012-07-02 ENCOUNTER — Ambulatory Visit (HOSPITAL_BASED_OUTPATIENT_CLINIC_OR_DEPARTMENT_OTHER): Payer: Medicare Other

## 2012-07-02 VITALS — BP 175/86 | HR 63 | Temp 98.5°F | Resp 18

## 2012-07-02 DIAGNOSIS — M479 Spondylosis, unspecified: Secondary | ICD-10-CM | POA: Diagnosis not present

## 2012-07-02 DIAGNOSIS — R599 Enlarged lymph nodes, unspecified: Secondary | ICD-10-CM | POA: Diagnosis not present

## 2012-07-02 DIAGNOSIS — C649 Malignant neoplasm of unspecified kidney, except renal pelvis: Secondary | ICD-10-CM | POA: Diagnosis not present

## 2012-07-02 DIAGNOSIS — R911 Solitary pulmonary nodule: Secondary | ICD-10-CM | POA: Diagnosis not present

## 2012-07-02 DIAGNOSIS — D709 Neutropenia, unspecified: Secondary | ICD-10-CM

## 2012-07-02 DIAGNOSIS — C8589 Other specified types of non-Hodgkin lymphoma, extranodal and solid organ sites: Secondary | ICD-10-CM | POA: Diagnosis not present

## 2012-07-02 DIAGNOSIS — C641 Malignant neoplasm of right kidney, except renal pelvis: Secondary | ICD-10-CM

## 2012-07-02 DIAGNOSIS — R918 Other nonspecific abnormal finding of lung field: Secondary | ICD-10-CM | POA: Insufficient documentation

## 2012-07-02 DIAGNOSIS — I700 Atherosclerosis of aorta: Secondary | ICD-10-CM | POA: Insufficient documentation

## 2012-07-02 DIAGNOSIS — I77810 Thoracic aortic ectasia: Secondary | ICD-10-CM | POA: Insufficient documentation

## 2012-07-02 DIAGNOSIS — I2510 Atherosclerotic heart disease of native coronary artery without angina pectoris: Secondary | ICD-10-CM | POA: Diagnosis not present

## 2012-07-02 DIAGNOSIS — C8330 Diffuse large B-cell lymphoma, unspecified site: Secondary | ICD-10-CM

## 2012-07-02 DIAGNOSIS — M949 Disorder of cartilage, unspecified: Secondary | ICD-10-CM | POA: Insufficient documentation

## 2012-07-02 DIAGNOSIS — M899 Disorder of bone, unspecified: Secondary | ICD-10-CM | POA: Diagnosis not present

## 2012-07-02 LAB — CBC WITH DIFFERENTIAL/PLATELET
Eosinophils Absolute: 0.1 10*3/uL (ref 0.0–0.5)
HCT: 40.3 % (ref 34.8–46.6)
LYMPH%: 14.3 % (ref 14.0–49.7)
MONO#: 0.4 10*3/uL (ref 0.1–0.9)
NEUT#: 4.1 10*3/uL (ref 1.5–6.5)
Platelets: 196 10*3/uL (ref 145–400)
RBC: 4.13 10*6/uL (ref 3.70–5.45)
WBC: 5.4 10*3/uL (ref 3.9–10.3)
lymph#: 0.8 10*3/uL — ABNORMAL LOW (ref 0.9–3.3)
nRBC: 0 % (ref 0–0)

## 2012-07-02 LAB — COMPREHENSIVE METABOLIC PANEL (CC13)
ALT: 17 U/L (ref 0–55)
AST: 18 U/L (ref 5–34)
Albumin: 3.8 g/dL (ref 3.5–5.0)
CO2: 30 mEq/L — ABNORMAL HIGH (ref 22–29)
Calcium: 9.7 mg/dL (ref 8.4–10.4)
Chloride: 105 mEq/L (ref 98–109)
Creatinine: 0.9 mg/dL (ref 0.6–1.1)
Potassium: 4 mEq/L (ref 3.5–5.1)
Total Protein: 6.5 g/dL (ref 6.4–8.3)

## 2012-07-02 MED ORDER — HEPARIN SOD (PORK) LOCK FLUSH 100 UNIT/ML IV SOLN
500.0000 [IU] | Freq: Once | INTRAVENOUS | Status: AC
  Administered 2012-07-02: 500 [IU] via INTRAVENOUS
  Filled 2012-07-02: qty 5

## 2012-07-02 MED ORDER — SODIUM CHLORIDE 0.9 % IJ SOLN
10.0000 mL | INTRAMUSCULAR | Status: DC | PRN
  Administered 2012-07-02: 10 mL via INTRAVENOUS
  Filled 2012-07-02: qty 10

## 2012-07-05 ENCOUNTER — Encounter: Payer: Self-pay | Admitting: Internal Medicine

## 2012-07-05 ENCOUNTER — Telehealth: Payer: Self-pay | Admitting: Internal Medicine

## 2012-07-05 ENCOUNTER — Ambulatory Visit (HOSPITAL_BASED_OUTPATIENT_CLINIC_OR_DEPARTMENT_OTHER): Payer: Medicare Other | Admitting: Internal Medicine

## 2012-07-05 VITALS — BP 153/85 | HR 69 | Temp 97.0°F | Resp 20 | Ht 63.0 in | Wt 118.9 lb

## 2012-07-05 DIAGNOSIS — C8590 Non-Hodgkin lymphoma, unspecified, unspecified site: Secondary | ICD-10-CM

## 2012-07-05 DIAGNOSIS — C8330 Diffuse large B-cell lymphoma, unspecified site: Secondary | ICD-10-CM

## 2012-07-05 DIAGNOSIS — C8589 Other specified types of non-Hodgkin lymphoma, extranodal and solid organ sites: Secondary | ICD-10-CM

## 2012-07-05 DIAGNOSIS — C649 Malignant neoplasm of unspecified kidney, except renal pelvis: Secondary | ICD-10-CM | POA: Diagnosis not present

## 2012-07-05 DIAGNOSIS — C641 Malignant neoplasm of right kidney, except renal pelvis: Secondary | ICD-10-CM

## 2012-07-05 NOTE — Patient Instructions (Signed)
There are enlarging pulmonary nodules as well as subcarinal lymphadenopathy on the recent scan. I will order a PET scan for further evaluation of these lesions. Followup visit in 2 weeks.

## 2012-07-05 NOTE — Progress Notes (Signed)
[facility] [phone]   Fax:(336) [phone]  OFFICE PROGRESS NOTE  [name], MD [address] [place] [address]  DIAGNOSIS:  #1 non-Hodgkin's lymphoma diagnosed on bone marrow biopsy in June of 2012  #2 stage III (T3a N0M0) renal cell carcinoma diagnosed in June of 2012   PRIOR THERAPY:  1) Status post right nephrectomy under the care of Dr. Retta Diones on 09/02/2010.  2) Systemic chemotherapy with CHOP/Rituxan given every 3 weeks, status post 6 cycles.   CURRENT THERAPY: Observation.  INTERVAL HISTORY: Melissa Cross 79 y.o. female returns to the clinic today for followup visit accompanied by her son Melissa Cross. The patient is feeling fine today with no specific complaints except for mild fatigue. She denied having any significant weight loss or night sweats. She denied having any bleeding issues or palpable lymphadenopathy. The patient has no chest pain, shortness breath, cough or hemoptysis. She had repeat CT scan of the chest, abdomen and pelvis performed recently and she is here for evaluation and discussion of her scan results.  MEDICAL HISTORY: Past Medical History  Diagnosis Date  . Hypertension   . Hypercholesteremia   . Glaucoma   . Arthritis   . Anemia   . Stroke     hx of TIA  . Blood transfusion   . Hypothyroidism   . Hypothyroid     on synthroid   . Thrush 12/17/2010  . Renal cell carcinoma   . Non Hodgkin's lymphoma   . Cancer     kidney cancer, NHL   . Cancer     last chemo 11/04/10     ALLERGIES:  is allergic to crab and dilaudid.  MEDICATIONS:  Current Outpatient Prescriptions  Medication Sig Dispense Refill  . aspirin EC 81 MG tablet Take 81 mg by mouth at bedtime.       . Calcium Citrate (CITRACAL PO) Take 1 tablet by mouth 3 (three) times a week.       . cholecalciferol (VITAMIN D) 400 UNITS TABS Take 400 Units by mouth daily.        . Coenzyme Q10 100 MG TABS Take 1 tablet by mouth daily.      .  Garlic 1000 MG CAPS Take 1,000 mg by mouth daily.        . Ginger, Zingiber officinalis, (GINGER ROOT) 550 MG CAPS Take 550 mg by mouth daily.        [name] levothyroxine (SYNTHROID, LEVOTHROID) 50 MCG tablet Take 50 mcg by mouth every morning.       . Multiple Vitamin (MULITIVITAMIN WITH MINERALS) TABS Take 1 tablet by mouth daily.        . nadolol (CORGARD) 40 MG tablet Take 40 mg by mouth at bedtime.       . simvastatin (ZOCOR) 20 MG tablet Take 7 mg by mouth at bedtime. She takes a third of a tablet.      [name] UNABLE TO FIND Take 2,000 mg by mouth daily. Wheat grass tablets      . vitamin B-12 (CYANOCOBALAMIN) 1000 MCG tablet Place 1,000 mcg under the tongue daily.        No current facility-administered medications for this visit.   Facility-Administered Medications Ordered in Other Visits  Medication Dose Route Frequency Provider Last Rate Last Dose  . sodium chloride 0.9 % injection 10 mL  10 mL Intravenous PRN Si Gaul, MD   10 mL at 05/17/12 0846    SURGICAL HISTORY:  Past Surgical History  Procedure Laterality Date  . Nephrectomy      Right Side  . Tonsillectomy    . Portacath placement      right subclavian    REVIEW OF SYSTEMS:  A comprehensive review of systems was negative except for: Constitutional: positive for fatigue   PHYSICAL EXAMINATION: General appearance: alert, cooperative and no distress Head: Normocephalic, without obvious abnormality, atraumatic Neck: no adenopathy Lymph nodes: Cervical, supraclavicular, and axillary nodes normal. Resp: clear to auscultation bilaterally Cardio: regular rate and rhythm, S1, S2 normal, no murmur, click, rub or gallop GI: soft, non-tender; bowel sounds normal; no masses,  no organomegaly Extremities: extremities normal, atraumatic, no cyanosis or edema  ECOG PERFORMANCE STATUS: 1 - Symptomatic but completely ambulatory  Blood pressure 153/85, pulse 69, temperature 97 F (36.1 C), temperature source Oral, resp. rate 20,  height 5\' 3"  (1.6 m), weight 118 lb 14.4 oz (53.933 kg).  LABORATORY DATA: Lab Results  Component Value Date   WBC 5.4 07/02/2012   HGB 13.5 07/02/2012   HCT 40.3 07/02/2012   MCV 97.6 07/02/2012   PLT 196 07/02/2012      Chemistry      Component Value Date/Time   NA 143 07/02/2012 0805   NA 140 09/27/2011 1600   K 4.0 07/02/2012 0805   K 3.7 09/27/2011 1600   CL 102 04/01/2012 0805   CL 103 09/27/2011 1600   CO2 30* 07/02/2012 0805   CO2 29 09/27/2011 1600   BUN 19.5 07/02/2012 0805   BUN 14 09/27/2011 1600   CREATININE 0.9 07/02/2012 0805   CREATININE 0.84 09/27/2011 1600      Component Value Date/Time   CALCIUM 9.7 07/02/2012 0805   CALCIUM 9.5 09/27/2011 1600   ALKPHOS 100 07/02/2012 0805   ALKPHOS 106 03/12/2011 0842   AST 18 07/02/2012 0805   AST 22 03/12/2011 0842   ALT 17 07/02/2012 0805   ALT 12 03/12/2011 0842   BILITOT 0.65 07/02/2012 0805   BILITOT 0.4 03/12/2011 0842       RADIOGRAPHIC STUDIES: Ct Chest Wo Contrast  07/02/2012   *RADIOLOGY REPORT*  Clinical Data: Restaging renal cell carcinoma  CT CHEST WITHOUT CONTRAST  Technique:  Multidetector CT imaging of the chest was performed following the standard protocol without IV contrast.  Comparison: 04/01/12  Findings: There is no pleural effusion identified.  Subpleural nodule within the anterior right middle lobe measures 1.5 cm, image 36/series 4.  Previously 1.1 cm.  Slightly more caudal are two right middle lobe enlarging nodules.  The largest measures 1.7 cm, image 39/series 4.  Enlarging right middle lobe subpleural nodule measures 6 mm, image 30/series 4.  Previously 3 mm.  Heart size appears normal.  There is no pericardial effusion. Calcifications involve the thoracic aorta, LAD and left circumflex coronary artery.  The ascending thoracic aorta is mildly increased in diameter measuring 4.1 cm, image 34/series 2.  Subcarinal lymph node measures 1.1 cm, image 28/series 2.  This is a new finding from previous exam.  No axillary or  supraclavicular adenopathy.  Review of the visualized bony structures is significant for osteopenia and spondylosis. Incidental imaging incidental imaging through the upper abdomen shows a stable 1.3 cm low attenuation structure in the right hepatic lobe.  The adrenal glands are stable in appearance.  IMPRESSION:  1.  Interval increase in size of right middle lobe pulmonary nodules which are suspicious for metastatic disease. 2. New and enlarge subcarinal lymph node is worrisome for metastatic  adenopathy. 3.  Mild aneurysmal dilatation of the ascending thoracic aorta.   Original Report Authenticated By: Signa Kell, M.D.    ASSESSMENT AND PLAN: This is a very pleasant 79 years old white female with history of non-Hodgkin lymphoma involving the bone marrow diagnosed in June of 2012 status post systemic chemotherapy with CHOP/Rituxan in addition to stage III renal cell carcinoma status post right nephrectomy. She has been observation and doing fine but unfortunately the recent CT scan of the chest showed interval increase in size of right middle lobe pulmonary nodules as well as new and enlarge subcarinal lymph node suspicious for metastatic adenopathy. I discussed the scan results and showed the images to the patient and her son. I recommended for her to have the PET scan performed to evaluate these pulmonary nodules as well as mediastinal lymphadenopathy and to rule out new primary lung cancer. I would see the patient back for followup visit in 2 weeks for evaluation and discussion of her scan results. She was advised to call immediately if she has any concerning symptoms in the interval.  All questions were answered. The patient knows to call the clinic with any problems, questions or concerns. We can certainly see the patient much sooner if necessary.

## 2012-07-05 NOTE — Telephone Encounter (Signed)
Gave pt appt for MD viisit , gave PET scan order to linda davis for precert

## 2012-07-13 ENCOUNTER — Encounter (HOSPITAL_COMMUNITY)
Admission: RE | Admit: 2012-07-13 | Discharge: 2012-07-13 | Disposition: A | Discharge status: Home / Self Care | Payer: Medicare Other | Source: Ambulatory Visit | Attending: Internal Medicine | Admitting: Internal Medicine

## 2012-07-13 ENCOUNTER — Encounter (HOSPITAL_COMMUNITY): Payer: Self-pay

## 2012-07-13 DIAGNOSIS — Z905 Acquired absence of kidney: Secondary | ICD-10-CM | POA: Insufficient documentation

## 2012-07-13 DIAGNOSIS — R918 Other nonspecific abnormal finding of lung field: Secondary | ICD-10-CM | POA: Diagnosis not present

## 2012-07-13 DIAGNOSIS — K7689 Other specified diseases of liver: Secondary | ICD-10-CM | POA: Insufficient documentation

## 2012-07-13 DIAGNOSIS — C649 Malignant neoplasm of unspecified kidney, except renal pelvis: Secondary | ICD-10-CM | POA: Insufficient documentation

## 2012-07-13 DIAGNOSIS — C8589 Other specified types of non-Hodgkin lymphoma, extranodal and solid organ sites: Secondary | ICD-10-CM | POA: Insufficient documentation

## 2012-07-13 DIAGNOSIS — C641 Malignant neoplasm of right kidney, except renal pelvis: Secondary | ICD-10-CM

## 2012-07-13 DIAGNOSIS — C8330 Diffuse large B-cell lymphoma, unspecified site: Secondary | ICD-10-CM

## 2012-07-13 DIAGNOSIS — C8590 Non-Hodgkin lymphoma, unspecified, unspecified site: Secondary | ICD-10-CM

## 2012-07-13 DIAGNOSIS — C969 Malignant neoplasm of lymphoid, hematopoietic and related tissue, unspecified: Secondary | ICD-10-CM | POA: Diagnosis not present

## 2012-07-13 LAB — GLUCOSE, CAPILLARY: Glucose-Capillary: 102 mg/dL — ABNORMAL HIGH (ref 70–99)

## 2012-07-13 MED ORDER — FLUDEOXYGLUCOSE F - 18 (FDG) INJECTION
18.3000 | Freq: Once | INTRAVENOUS | Status: AC | PRN
  Administered 2012-07-13: 18.3 via INTRAVENOUS

## 2012-07-19 ENCOUNTER — Ambulatory Visit (HOSPITAL_BASED_OUTPATIENT_CLINIC_OR_DEPARTMENT_OTHER): Payer: Medicare Other | Admitting: Internal Medicine

## 2012-07-19 ENCOUNTER — Encounter: Payer: Self-pay | Admitting: Internal Medicine

## 2012-07-19 ENCOUNTER — Telehealth: Payer: Self-pay | Admitting: Internal Medicine

## 2012-07-19 VITALS — BP 148/82 | HR 76 | Temp 97.6°F | Resp 18 | Ht 63.0 in | Wt 118.0 lb

## 2012-07-19 DIAGNOSIS — C8330 Diffuse large B-cell lymphoma, unspecified site: Secondary | ICD-10-CM

## 2012-07-19 DIAGNOSIS — C641 Malignant neoplasm of right kidney, except renal pelvis: Secondary | ICD-10-CM

## 2012-07-19 DIAGNOSIS — C649 Malignant neoplasm of unspecified kidney, except renal pelvis: Secondary | ICD-10-CM

## 2012-07-19 DIAGNOSIS — C8589 Other specified types of non-Hodgkin lymphoma, extranodal and solid organ sites: Secondary | ICD-10-CM

## 2012-07-19 NOTE — Patient Instructions (Addendum)
PET scan showed activity in the right upper lobe nodule as well as subcarinal lymph node. I will order CT-guided biopsy of the right upper lobe lung nodule. Followup visit in 2 weeks

## 2012-07-19 NOTE — Progress Notes (Signed)
[facility] [phone]   [phone]  OFFICE PROGRESS NOTE  [name], MD [address] [place] [address]  DIAGNOSIS:  #1 non-Hodgkin's lymphoma diagnosed on bone marrow biopsy in June of 2012  #2 stage III (T3a N0M0) renal cell carcinoma diagnosed in June of 2012   PRIOR THERAPY:  1) Status post right nephrectomy under the care of Dr. Retta Diones on 09/02/2010.  2) Systemic chemotherapy with CHOP/Rituxan given every 3 weeks, status post 6 cycles.   CURRENT THERAPY: Observation.   INTERVAL HISTORY: Melissa Cross 79 y.o. female returns to the clinic today for follow up visit accompanied by her son Melissa Cross. The patient has no complaints today except for mild fatigue. She denied having any significant chest pain, shortness breath, cough or hemoptysis. She denied having any significant weight loss or night sweats. She has no nausea or vomiting. She was found on recent CT scan of the chest to have questionable right lung nodule in addition to subcarinal lymphadenopathy. The patient had a PET scan performed recently for further evaluation of this lesions and she is here today for discussion of his scan results and recommendation regarding treatment of her condition.  MEDICAL HISTORY: Past Medical History  Diagnosis Date  . Hypertension   . Hypercholesteremia   . Glaucoma   . Arthritis   . Anemia   . Stroke     hx of TIA  . Blood transfusion   . Hypothyroidism   . Hypothyroid     on synthroid   . Thrush 12/17/2010  . Renal cell carcinoma   . Non Hodgkin's lymphoma   . Cancer     kidney cancer, NHL   . Cancer     last chemo 11/04/10     ALLERGIES:  is allergic to crab and dilaudid.  MEDICATIONS:  Current Outpatient Prescriptions  Medication Sig Dispense Refill  . aspirin EC 81 MG tablet Take 81 mg by mouth at bedtime.       . Calcium Citrate (CITRACAL PO) Take 1 tablet by mouth 3 (three) times a week.       .  cholecalciferol (VITAMIN D) 400 UNITS TABS Take 400 Units by mouth daily.        . Coenzyme Q10 100 MG TABS Take 1 tablet by mouth daily.      . Garlic 1000 MG CAPS Take 1,000 mg by mouth daily.        . Ginger, Zingiber officinalis, (GINGER ROOT) 550 MG CAPS Take 550 mg by mouth daily.        [name] levothyroxine (SYNTHROID, LEVOTHROID) 50 MCG tablet Take 50 mcg by mouth every morning.       . Multiple Vitamin (MULITIVITAMIN WITH MINERALS) TABS Take 1 tablet by mouth daily.        . nadolol (CORGARD) 40 MG tablet Take 40 mg by mouth at bedtime.       . simvastatin (ZOCOR) 20 MG tablet Take 7 mg by mouth at bedtime. She takes a third of a tablet.      [name] UNABLE TO FIND Take 2,000 mg by mouth daily. Wheat grass tablets      . vitamin B-12 (CYANOCOBALAMIN) 1000 MCG tablet Place 1,000 mcg under the tongue daily.        No current facility-administered medications for this visit.   Facility-Administered Medications Ordered in Other Visits  Medication Dose Route Frequency Provider Last Rate Last Dose  . sodium chloride 0.9 %  injection 10 mL  10 mL Intravenous PRN Si Gaul, MD   10 mL at 05/17/12 0846    SURGICAL HISTORY:  Past Surgical History  Procedure Laterality Date  . Nephrectomy      Right Side  . Tonsillectomy    . Portacath placement      right subclavian    REVIEW OF SYSTEMS:  A comprehensive review of systems was negative.   PHYSICAL EXAMINATION: General appearance: alert, cooperative, fatigued and no distress Head: Normocephalic, without obvious abnormality, atraumatic Neck: no adenopathy Lymph nodes: Cervical, supraclavicular, and axillary nodes normal. Resp: clear to auscultation bilaterally Cardio: regular rate and rhythm, S1, S2 normal, no murmur, click, rub or gallop GI: soft, non-tender; bowel sounds normal; no masses,  no organomegaly Extremities: extremities normal, atraumatic, no cyanosis or edema  ECOG PERFORMANCE STATUS: 1 - Symptomatic but completely  ambulatory  Blood pressure 148/82, pulse 76, temperature 97.6 F (36.4 C), temperature source Oral, resp. rate 18, height 5\' 3"  (1.6 m), weight 118 lb (53.524 kg).  LABORATORY DATA: Lab Results  Component Value Date   WBC 5.4 07/02/2012   HGB 13.5 07/02/2012   HCT 40.3 07/02/2012   MCV 97.6 07/02/2012   PLT 196 07/02/2012      Chemistry      Component Value Date/Time   NA 143 07/02/2012 0805   NA 140 09/27/2011 1600   K 4.0 07/02/2012 0805   K 3.7 09/27/2011 1600   CL 102 04/01/2012 0805   CL 103 09/27/2011 1600   CO2 30* 07/02/2012 0805   CO2 29 09/27/2011 1600   BUN 19.5 07/02/2012 0805   BUN 14 09/27/2011 1600   CREATININE 0.9 07/02/2012 0805   CREATININE 0.84 09/27/2011 1600      Component Value Date/Time   CALCIUM 9.7 07/02/2012 0805   CALCIUM 9.5 09/27/2011 1600   ALKPHOS 100 07/02/2012 0805   ALKPHOS 106 03/12/2011 0842   AST 18 07/02/2012 0805   AST 22 03/12/2011 0842   ALT 17 07/02/2012 0805   ALT 12 03/12/2011 0842   BILITOT 0.65 07/02/2012 0805   BILITOT 0.4 03/12/2011 0842       RADIOGRAPHIC STUDIES: Ct Chest Wo Contrast  07/02/2012   *RADIOLOGY REPORT*  Clinical Data: Restaging renal cell carcinoma  CT CHEST WITHOUT CONTRAST  Technique:  Multidetector CT imaging of the chest was performed following the standard protocol without IV contrast.  Comparison: 04/01/12  Findings: There is no pleural effusion identified.  Subpleural nodule within the anterior right middle lobe measures 1.5 cm, image 36/series 4.  Previously 1.1 cm.  Slightly more caudal are two right middle lobe enlarging nodules.  The largest measures 1.7 cm, image 39/series 4.  Enlarging right middle lobe subpleural nodule measures 6 mm, image 30/series 4.  Previously 3 mm.  Heart size appears normal.  There is no pericardial effusion. Calcifications involve the thoracic aorta, LAD and left circumflex coronary artery.  The ascending thoracic aorta is mildly increased in diameter measuring 4.1 cm, image 34/series 2.   Subcarinal lymph node measures 1.1 cm, image 28/series 2.  This is a new finding from previous exam.  No axillary or supraclavicular adenopathy.  Review of the visualized bony structures is significant for osteopenia and spondylosis. Incidental imaging incidental imaging through the upper abdomen shows a stable 1.3 cm low attenuation structure in the right hepatic lobe.  The adrenal glands are stable in appearance.  IMPRESSION:  1.  Interval increase in size of right middle lobe pulmonary nodules  which are suspicious for metastatic disease. 2. New and enlarge subcarinal lymph node is worrisome for metastatic adenopathy. 3.  Mild aneurysmal dilatation of the ascending thoracic aorta.   Original Report Authenticated By: Signa Kell, M.D.   Nm Pet Image Initial (pi) Skull Base To Thigh  07/13/2012   *RADIOLOGY REPORT*  Clinical Data: Initial treatment strategy for pulmonary nodules. History of non-Hodgkins lymphoma and renal cell carcinoma.  NUCLEAR MEDICINE PET SKULL BASE TO THIGH  Fasting Blood Glucose:  102  Technique:  18.3 mCi F-18 FDG was injected intravenously. CT data was obtained and used for attenuation correction and anatomic localization only.  (This was not acquired as a diagnostic CT examination.) Additional exam technical data entered on technologist worksheet.  Comparison:  07/02/2012 chest CT.  04/01/2012 chest, abdomen, and pelvic CTs.  Prior PET of 06/24/2010.  Findings:  Neck: No abnormal hypermetabolism.  Chest:  Hypermetabolic subcarinal node which measures 1.5 cm and a S.U.V. max of 10.1 on image 76/series 2.  Hypermetabolism corresponding to 2 adjacent right middle lobe lung nodules.  These measure up to 1.9 cm and a S.U.V. max of 6.2 on image 88/series 2.  Abdomen/Pelvis:  Equivocal hypermetabolism in the region of mild left adrenal nodularity.  Example image 115/series 2.  Skeleton:  No abnormal marrow activity.  CT  images performed for attenuation correction demonstrate no significant  findings within the neck.  A right-sided Port-A-Cath which terminates at the low SVC.  Other chest findings deferred to recent diagnostic CT.  Right nephrectomy.  Motion degraded evaluation of the abdomen. Right hepatic lobe cyst.  Redemonstration of a left adnexal cystic lesion at 3.0 cm on image 171/series 2.  IMPRESSION:  1.  Hypermetabolic subcarinal node, suspicious for metastatic disease. 2.  Hypermetabolism corresponding to peripheral right middle lobe nodules.  Also suspicious for metastasis.  Infectious etiology of the pulmonary findings is felt less likely. 3.  Equivocal left adrenal hypermetabolism with nodularity.  This warrants followup attention.   Original Report Authenticated By: Jeronimo Greaves, M.D.    ASSESSMENT AND PLAN: this is a very pleasant 79 years old white female with history of non-Hodgkin lymphoma, status post systemic chemotherapy with CHOP/Rituxan in addition to history of stage III renal cell carcinoma status post right nephrectomy. The recent CT scan of the chest in addition to the PET scan showed right middle lobe lung nodule that is hypermetabolic in addition to hypermetabolic subcarinal lymphadenopathy, suspicious to be related to her renal cell carcinoma versus primary lung carcinoma. I have a lengthy discussion with the patient and her son today and I showed them the images of the PET scan.  I initially recommended for the patient to proceed with CT guided core biopsy of the right middle lobe lung nodule by interventional radiology but I received a call from Dr.watts indicating that there is a high risk for bleeding with biopsy of the right middle lobe lung nodule if it is metastatic disease from renal cell carcinoma. He recommended consideration of needle biopsy of the subcarinal lymph nodes via bronchoscopy and EBUS but endoscopic ultrasound by gastroenterology is another option. I will consult with pulmonary/cardiothoracic surgery for consideration of bronchoscopy and  EBUS with biopsy of the subcarinal lymph node.   The patient would come back for follow up visit in 2 weeks for reevaluation and discussion of her biopsy results and treatment options. She was advised to call immediately if she has any concerning symptoms in the interval.  All questions were answered. The patient  knows to call the clinic with any problems, questions or concerns. We can certainly see the patient much sooner if necessary.

## 2012-07-19 NOTE — Telephone Encounter (Signed)
Gave pt appt for Md 2 weeks from today

## 2012-07-23 ENCOUNTER — Encounter: Payer: Self-pay | Admitting: Emergency Medicine

## 2012-07-23 ENCOUNTER — Ambulatory Visit (INDEPENDENT_AMBULATORY_CARE_PROVIDER_SITE_OTHER): Payer: Medicare Other | Admitting: Emergency Medicine

## 2012-07-23 VITALS — BP 118/76 | HR 75 | Temp 97.2°F | Ht 63.5 in | Wt 119.2 lb

## 2012-07-23 DIAGNOSIS — R590 Localized enlarged lymph nodes: Secondary | ICD-10-CM

## 2012-07-23 DIAGNOSIS — R599 Enlarged lymph nodes, unspecified: Secondary | ICD-10-CM | POA: Diagnosis not present

## 2012-07-23 DIAGNOSIS — R911 Solitary pulmonary nodule: Secondary | ICD-10-CM

## 2012-07-23 NOTE — Patient Instructions (Addendum)
Will set up EBUS/possible ENB over at [facility]

## 2012-07-23 NOTE — Progress Notes (Signed)
Subjective:    Patient ID: Melissa Cross, female    DOB: Jul 21, 1925, 79 y.o.   MRN: [id number]  HPI BW is a 79 yo F with hx of right nephorectomy  for renal carcinoma (2012) and hx of remission after chemo treatment for non-hodkin's lumphoma (completed in 2013). She presents with recent discovery of two prominent masses in peripheral lateral rt lung and a lymph node cluster inferior to carina, discovered on CT and PET (06/2012).   Functionally she tires easily and experiences exertional dyspnea, but denies cough, sputum, chest tightness, chest pain, leg swelling, and fever/chills/sweats. In June she had one episode of right-thoracic chest pain described as "sharp, gaslike" discomfort, but has not had this pain again since.  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 No recent hospitalizations or illness.   PET 6/30, IMPRESSION:  1. Hypermetabolic subcarinal node, suspicious for metastatic  disease.  2. Hypermetabolism corresponding to peripheral right middle lobe  nodules. Also suspicious for metastasis. Infectious etiology of  the pulmonary findings is felt less likely.  3. Equivocal left adrenal hypermetabolism with nodularity. This  warrants followup attention.  CT scan w/o contrast (6/27) IMPRESSION:  1. Interval increase in size of right middle lobe pulmonary  nodules which are suspicious for metastatic disease.  2. New and enlarge subcarinal lymph node is worrisome for  metastatic adenopathy.  3. Mild aneurysmal dilatation of the ascending thoracic aorta.   Comparison: 04/01/12  Findings: There is no pleural effusion identified. Subpleural  nodule within the anterior right middle lobe measures 1.5 cm, image  36/series 4. Previously 1.1 cm. Slightly more caudal are two  right middle lobe enlarging nodules. The largest measures 1.7 cm,  image 39/series 4. Enlarging right middle lobe subpleural nodule  measures 6 mm,  image 30/series 4. Previously 3 mm.    Review of Systems  Constitutional: Negative for fever and unexpected weight change.  HENT: Negative for ear pain, nosebleeds, congestion, sore throat, rhinorrhea, sneezing, trouble swallowing, dental problem, postnasal drip and sinus pressure.   Eyes: Negative for redness and itching.  Respiratory: Positive for shortness of breath. Negative for cough, chest tightness and wheezing.   Cardiovascular: Negative for palpitations and leg swelling.  Gastrointestinal: Negative for nausea and vomiting.  Genitourinary: Negative for dysuria.  Musculoskeletal: Negative for joint swelling.  Skin: Negative for rash.  Neurological: Negative for headaches.  Hematological: Does not bruise/bleed easily.  Psychiatric/Behavioral: Negative for dysphoric mood. The patient is not nervous/anxious.    Past Medical History  Diagnosis Date  . Hypertension   . Hypercholesteremia   . Glaucoma   . Arthritis   . Anemia   . Stroke     hx of TIA  . Blood transfusion   . Hypothyroidism   . Hypothyroid     on synthroid   . Thrush 12/17/2010  . Renal cell carcinoma   . Non Hodgkin's lymphoma   . Cancer     kidney cancer, NHL   . Cancer     last chemo 11/04/10      Family History  Problem Relation Age of Onset  . Colon cancer Father   . Stroke Mother      History   Social History  . Marital Status: Single    Spouse Name: N/A    Number of Children: N/A  . Years of Education: N/A   Occupational History  . Not on file.   Social History Main  Topics  . Smoking status: Former Smoker -- 0.50 packs/day for 25 years    Quit date: 01/07/1988  . Smokeless tobacco: Not on file  . Alcohol Use: No  . Drug Use: No  . Sexually Active: No   Other Topics Concern  . Not on file   Social History Narrative  . No narrative on file     Allergies  Allergen Reactions  . Crab (Shellfish Allergy) Nausea And Vomiting  . Dilaudid (Hydromorphone Hcl) Other (See  Comments)    Hallucinations and nausea     Outpatient Prescriptions Prior to Visit  Medication Sig Dispense Refill  . aspirin EC 81 MG tablet Take 81 mg by mouth at bedtime.       . Calcium Citrate (CITRACAL PO) Take 1 tablet by mouth 3 (three) times a week.       . cholecalciferol (VITAMIN D) 400 UNITS TABS Take 400 Units by mouth daily.        . Coenzyme Q10 100 MG TABS Take 1 tablet by mouth daily.      . Garlic 1000 MG CAPS Take 1,000 mg by mouth daily.        . Ginger, Zingiber officinalis, (GINGER ROOT) 550 MG CAPS Take 550 mg by mouth daily.        [name] levothyroxine (SYNTHROID, LEVOTHROID) 50 MCG tablet Take 50 mcg by mouth every morning.       . Multiple Vitamin (MULITIVITAMIN WITH MINERALS) TABS Take 1 tablet by mouth daily.        . nadolol (CORGARD) 40 MG tablet Take 40 mg by mouth at bedtime.       . simvastatin (ZOCOR) 20 MG tablet Take 7 mg by mouth at bedtime. She takes a third of a tablet.      [name] UNABLE TO FIND Take 2,000 mg by mouth daily. Wheat grass tablets      . vitamin B-12 (CYANOCOBALAMIN) 1000 MCG tablet Place 1,000 mcg under the tongue daily.        Facility-Administered Medications Prior to Visit  Medication Dose Route Frequency Provider Last Rate Last Dose  . sodium chloride 0.9 % injection 10 mL  10 mL Intravenous PRN Si Gaul, MD   10 mL at 05/17/12 0846         Objective:   Physical Exam Filed Vitals:   07/23/12 1004 07/23/12 1006  BP:  118/76  Pulse:  75  Temp: 97.2 F (36.2 C)   TempSrc: Oral   Height: 5' 3.5" (1.613 m)   Weight: 119 lb 3.2 oz (54.069 kg)   SpO2:  98%    Gen: Pleasant, well-nourished, in no distress,  normal affect  ENT: No lesions,  mouth clear,  oropharynx clear, no postnasal drip  Neck: No JVD, no TMG, no carotid bruits  Lungs: No use of accessory muscles, no dullness to percussion, clear without rales or rhonchi.  Cardiovascular: RRR, heart sounds normal, no murmur or gallops, no peripheral edema  Abdomen:  soft and NT, no HSM,  BS normal  Musculoskeletal: No deformities, no cyanosis or clubbing  Neuro: alert, non focal  Skin: Warm, no lesions or rashes  No results found.        Assessment & Plan:  Schedule Bronchoscopy with Dr. Delton Coombes Educate on complications (bleeding, pneumonia, collapsed lung, and need for possible hospitalization) and incomplete biopsy   Attending:  I agree with the data, exam, assessment as amended above.  Will plan for EBU +/- ENB, arrange at [facility]  Levy Pupa, MD, PhD 10/05/2012, 3:50 PM [facility] [phone] or if no answer [phone]

## 2012-07-26 ENCOUNTER — Telehealth: Payer: Self-pay | Admitting: Internal Medicine

## 2012-07-26 ENCOUNTER — Other Ambulatory Visit: Payer: Self-pay | Admitting: Emergency Medicine

## 2012-07-26 DIAGNOSIS — R918 Other nonspecific abnormal finding of lung field: Secondary | ICD-10-CM

## 2012-07-26 NOTE — Telephone Encounter (Signed)
s.w. pt and she wanted to move 7.31.14 appt to 8.7.Marland KitchenMarland KitchenDone

## 2012-07-27 ENCOUNTER — Encounter (HOSPITAL_COMMUNITY): Payer: Self-pay | Admitting: Pharmacy Technician

## 2012-08-03 ENCOUNTER — Telehealth: Payer: Self-pay | Admitting: Emergency Medicine

## 2012-08-03 NOTE — Telephone Encounter (Signed)
Spoke with Rosey Bath at [facility] (Silva Bandy is gone for the day) Per Rosey Bath patient is scheduled for Bronch 8/1 and will have pre admit tomorrow morning @ 8am Pt needing lab ordered (if RB wants this) and consent I have advised that RB is in eLink 11pm tonight and I will forward msg to him to respond tonight, also paged  Dr. Delton Coombes please advise if you want labs ordered for patient bronch and pts consent needed thanks

## 2012-08-03 NOTE — Progress Notes (Signed)
Spoke with Nicholos Johns requesting orders be put into epic

## 2012-08-03 NOTE — Pre-Procedure Instructions (Signed)
AVALEY COOP  08/03/2012   Your procedure is scheduled on:  Fri, Aug 1 @ 7:30 AM  Report to [facility] at 5:30 AM.  Call this number if you have problems the morning of surgery: [phone]   Remember:   Do not eat food or drink liquids after midnight.   Take these medicines the morning of surgery with A SIP OF WATER: Levothyroxine(Synthroid)              Stop taking your Aspirin and COQ10.No Goody's,BC's,Aleve,Ibuprofen,or any Herbal Medications   Do not wear jewelry, make-up or nail polish.  Do not wear lotions, powders, or perfumes. You may wear deodorant.  Do not shave 48 hours prior to surgery.   Do not bring valuables to the hospital.  [facility] is not responsible                   for any belongings or valuables.  Contacts, dentures or bridgework may not be worn into surgery.  Leave suitcase in the car. After surgery it may be brought to your room.  For patients admitted to the hospital, checkout time is 11:00 AM the day of  discharge.   Patients discharged the day of surgery will not be allowed to drive  home.    Special Instructions: Shower using CHG 2 nights before surgery and the night before surgery.  If you shower the day of surgery use CHG.  Use special wash - you have one bottle of CHG for all showers.  You should use approximately 1/3 of the bottle for each shower.   Please read over the following fact sheets that you were given: Pain Booklet, Coughing and Deep Breathing and Surgical Site Infection Prevention

## 2012-08-04 ENCOUNTER — Encounter (HOSPITAL_COMMUNITY)
Admission: RE | Admit: 2012-08-04 | Discharge: 2012-08-04 | Disposition: A | Discharge status: Home / Self Care | Payer: Medicare Other | Source: Ambulatory Visit | Attending: Anesthesiology | Admitting: Anesthesiology

## 2012-08-04 ENCOUNTER — Encounter (HOSPITAL_COMMUNITY)
Admission: RE | Admit: 2012-08-04 | Discharge: 2012-08-04 | Disposition: A | Discharge status: Home / Self Care | Payer: Medicare Other | Source: Ambulatory Visit | Attending: Emergency Medicine | Admitting: Emergency Medicine

## 2012-08-04 ENCOUNTER — Encounter (HOSPITAL_COMMUNITY): Payer: Self-pay

## 2012-08-04 DIAGNOSIS — H409 Unspecified glaucoma: Secondary | ICD-10-CM | POA: Diagnosis not present

## 2012-08-04 DIAGNOSIS — Z01818 Encounter for other preprocedural examination: Secondary | ICD-10-CM | POA: Diagnosis not present

## 2012-08-04 DIAGNOSIS — Z905 Acquired absence of kidney: Secondary | ICD-10-CM | POA: Diagnosis not present

## 2012-08-04 DIAGNOSIS — M129 Arthropathy, unspecified: Secondary | ICD-10-CM | POA: Diagnosis not present

## 2012-08-04 DIAGNOSIS — Z886 Allergy status to analgesic agent status: Secondary | ICD-10-CM | POA: Diagnosis not present

## 2012-08-04 DIAGNOSIS — C3430 Malignant neoplasm of lower lobe, unspecified bronchus or lung: Secondary | ICD-10-CM | POA: Diagnosis not present

## 2012-08-04 DIAGNOSIS — E7800 Pure hypercholesterolemia, unspecified: Secondary | ICD-10-CM | POA: Diagnosis not present

## 2012-08-04 DIAGNOSIS — Z8673 Personal history of transient ischemic attack (TIA), and cerebral infarction without residual deficits: Secondary | ICD-10-CM | POA: Diagnosis not present

## 2012-08-04 DIAGNOSIS — Z91013 Allergy to seafood: Secondary | ICD-10-CM | POA: Diagnosis not present

## 2012-08-04 DIAGNOSIS — Z885 Allergy status to narcotic agent status: Secondary | ICD-10-CM | POA: Diagnosis not present

## 2012-08-04 DIAGNOSIS — R6889 Other general symptoms and signs: Secondary | ICD-10-CM | POA: Diagnosis not present

## 2012-08-04 DIAGNOSIS — Z9221 Personal history of antineoplastic chemotherapy: Secondary | ICD-10-CM | POA: Diagnosis not present

## 2012-08-04 DIAGNOSIS — I10 Essential (primary) hypertension: Secondary | ICD-10-CM | POA: Diagnosis not present

## 2012-08-04 DIAGNOSIS — C8589 Other specified types of non-Hodgkin lymphoma, extranodal and solid organ sites: Secondary | ICD-10-CM | POA: Diagnosis not present

## 2012-08-04 DIAGNOSIS — D649 Anemia, unspecified: Secondary | ICD-10-CM | POA: Diagnosis not present

## 2012-08-04 DIAGNOSIS — E039 Hypothyroidism, unspecified: Secondary | ICD-10-CM | POA: Diagnosis not present

## 2012-08-04 DIAGNOSIS — Z85528 Personal history of other malignant neoplasm of kidney: Secondary | ICD-10-CM | POA: Diagnosis not present

## 2012-08-04 DIAGNOSIS — Z79899 Other long term (current) drug therapy: Secondary | ICD-10-CM | POA: Diagnosis not present

## 2012-08-04 DIAGNOSIS — R599 Enlarged lymph nodes, unspecified: Secondary | ICD-10-CM | POA: Diagnosis not present

## 2012-08-04 HISTORY — DX: Shortness of breath: R06.02

## 2012-08-04 HISTORY — DX: Unspecified cataract: H26.9

## 2012-08-04 HISTORY — DX: Transient cerebral ischemic attack, unspecified: G45.9

## 2012-08-04 HISTORY — DX: Headache: R51

## 2012-08-04 HISTORY — DX: Personal history of other infectious and parasitic diseases: Z86.19

## 2012-08-04 HISTORY — DX: Nausea with vomiting, unspecified: R11.2

## 2012-08-04 HISTORY — DX: Other specified postprocedural states: Z98.890

## 2012-08-04 HISTORY — DX: Scoliosis, unspecified: M41.9

## 2012-08-04 HISTORY — DX: Personal history of urinary (tract) infections: Z87.440

## 2012-08-04 HISTORY — DX: Polyneuropathy, unspecified: G62.9

## 2012-08-04 HISTORY — DX: Personal history of other medical treatment: Z92.89

## 2012-08-04 LAB — BASIC METABOLIC PANEL
BUN: 16 mg/dL (ref 6–23)
Chloride: 101 mEq/L (ref 96–112)
GFR calc Af Amer: 58 mL/min — ABNORMAL LOW (ref >90)
Potassium: 4 mEq/L (ref 3.5–5.1)

## 2012-08-04 LAB — CBC
HCT: 42.4 % (ref 36.0–46.0)
Platelets: 202 10*3/uL (ref 150–400)
RBC: 4.35 MIL/uL (ref 3.87–5.11)
RDW: 13.2 % (ref 11.5–15.5)
WBC: 5.1 10*3/uL (ref 4.0–10.5)

## 2012-08-04 LAB — PROTIME-INR: Prothrombin Time: 12.6 seconds (ref 11.6–15.2)

## 2012-08-04 NOTE — Progress Notes (Addendum)
Pt doesn't have a cardiologist  Echo report in epic from 2012  Denies ever having a stress test/heart cath  Medical MD is Dr.Sharon Laurine Blazer  EKG in epic from 09-27-11  Denies CXR in past yr

## 2012-08-04 NOTE — Telephone Encounter (Signed)
Thank you - done this am

## 2012-08-05 ENCOUNTER — Ambulatory Visit: Payer: Medicare Other | Admitting: Internal Medicine

## 2012-08-06 ENCOUNTER — Ambulatory Visit (HOSPITAL_COMMUNITY)
Admission: RE | Admit: 2012-08-06 | Discharge: 2012-08-06 | Disposition: A | Discharge status: Home / Self Care | Payer: Medicare Other | Source: Ambulatory Visit | Attending: Emergency Medicine | Admitting: Emergency Medicine

## 2012-08-06 ENCOUNTER — Ambulatory Visit (HOSPITAL_COMMUNITY): Payer: Medicare Other | Admitting: Anesthesiology

## 2012-08-06 ENCOUNTER — Ambulatory Visit (HOSPITAL_COMMUNITY): Payer: Medicare Other

## 2012-08-06 ENCOUNTER — Encounter (HOSPITAL_COMMUNITY): Payer: Self-pay | Admitting: Anesthesiology

## 2012-08-06 ENCOUNTER — Encounter (HOSPITAL_COMMUNITY): Payer: Self-pay | Admitting: *Deleted

## 2012-08-06 ENCOUNTER — Encounter (HOSPITAL_COMMUNITY)
Admission: RE | Disposition: A | Discharge status: Home / Self Care | Payer: Self-pay | Source: Ambulatory Visit | Attending: Emergency Medicine

## 2012-08-06 DIAGNOSIS — R6889 Other general symptoms and signs: Secondary | ICD-10-CM | POA: Diagnosis not present

## 2012-08-06 DIAGNOSIS — R911 Solitary pulmonary nodule: Secondary | ICD-10-CM | POA: Diagnosis not present

## 2012-08-06 DIAGNOSIS — Z87898 Personal history of other specified conditions: Secondary | ICD-10-CM | POA: Diagnosis not present

## 2012-08-06 DIAGNOSIS — M129 Arthropathy, unspecified: Secondary | ICD-10-CM | POA: Insufficient documentation

## 2012-08-06 DIAGNOSIS — Z85528 Personal history of other malignant neoplasm of kidney: Secondary | ICD-10-CM | POA: Insufficient documentation

## 2012-08-06 DIAGNOSIS — E7800 Pure hypercholesterolemia, unspecified: Secondary | ICD-10-CM | POA: Insufficient documentation

## 2012-08-06 DIAGNOSIS — Z91013 Allergy to seafood: Secondary | ICD-10-CM | POA: Insufficient documentation

## 2012-08-06 DIAGNOSIS — Z79899 Other long term (current) drug therapy: Secondary | ICD-10-CM | POA: Insufficient documentation

## 2012-08-06 DIAGNOSIS — C8330 Diffuse large B-cell lymphoma, unspecified site: Secondary | ICD-10-CM | POA: Diagnosis present

## 2012-08-06 DIAGNOSIS — C779 Secondary and unspecified malignant neoplasm of lymph node, unspecified: Secondary | ICD-10-CM | POA: Diagnosis not present

## 2012-08-06 DIAGNOSIS — I10 Essential (primary) hypertension: Secondary | ICD-10-CM | POA: Insufficient documentation

## 2012-08-06 DIAGNOSIS — Z886 Allergy status to analgesic agent status: Secondary | ICD-10-CM | POA: Insufficient documentation

## 2012-08-06 DIAGNOSIS — Z9221 Personal history of antineoplastic chemotherapy: Secondary | ICD-10-CM | POA: Insufficient documentation

## 2012-08-06 DIAGNOSIS — C8589 Other specified types of non-Hodgkin lymphoma, extranodal and solid organ sites: Secondary | ICD-10-CM | POA: Insufficient documentation

## 2012-08-06 DIAGNOSIS — Z905 Acquired absence of kidney: Secondary | ICD-10-CM | POA: Insufficient documentation

## 2012-08-06 DIAGNOSIS — Z8673 Personal history of transient ischemic attack (TIA), and cerebral infarction without residual deficits: Secondary | ICD-10-CM | POA: Insufficient documentation

## 2012-08-06 DIAGNOSIS — D649 Anemia, unspecified: Secondary | ICD-10-CM | POA: Insufficient documentation

## 2012-08-06 DIAGNOSIS — Z885 Allergy status to narcotic agent status: Secondary | ICD-10-CM | POA: Insufficient documentation

## 2012-08-06 DIAGNOSIS — C3430 Malignant neoplasm of lower lobe, unspecified bronchus or lung: Secondary | ICD-10-CM | POA: Insufficient documentation

## 2012-08-06 DIAGNOSIS — R599 Enlarged lymph nodes, unspecified: Secondary | ICD-10-CM | POA: Insufficient documentation

## 2012-08-06 DIAGNOSIS — E039 Hypothyroidism, unspecified: Secondary | ICD-10-CM | POA: Insufficient documentation

## 2012-08-06 DIAGNOSIS — R0602 Shortness of breath: Secondary | ICD-10-CM | POA: Diagnosis not present

## 2012-08-06 DIAGNOSIS — R918 Other nonspecific abnormal finding of lung field: Secondary | ICD-10-CM | POA: Diagnosis not present

## 2012-08-06 DIAGNOSIS — H409 Unspecified glaucoma: Secondary | ICD-10-CM | POA: Insufficient documentation

## 2012-08-06 HISTORY — PX: VIDEO BRONCHOSCOPY WITH ENDOBRONCHIAL NAVIGATION: SHX6175

## 2012-08-06 HISTORY — PX: VIDEO BRONCHOSCOPY WITH ENDOBRONCHIAL ULTRASOUND: SHX6177

## 2012-08-06 SURGERY — BRONCHOSCOPY, WITH EBUS
Anesthesia: General | Site: Chest | Wound class: Clean Contaminated

## 2012-08-06 MED ORDER — OXYCODONE HCL 5 MG/5ML PO SOLN
5.0000 mg | Freq: Once | ORAL | Status: DC | PRN
Start: 2012-08-06 — End: 2012-08-06

## 2012-08-06 MED ORDER — 0.9 % SODIUM CHLORIDE (POUR BTL) OPTIME
TOPICAL | Status: DC | PRN
  Administered 2012-08-06: 1000 mL

## 2012-08-06 MED ORDER — LIDOCAINE HCL (CARDIAC) 20 MG/ML IV SOLN
INTRAVENOUS | Status: DC | PRN
  Administered 2012-08-06: 100 mg via INTRAVENOUS

## 2012-08-06 MED ORDER — NEOSTIGMINE METHYLSULFATE 1 MG/ML IJ SOLN
INTRAMUSCULAR | Status: DC | PRN
  Administered 2012-08-06: 3 mg via INTRAVENOUS

## 2012-08-06 MED ORDER — MEPERIDINE HCL 25 MG/ML IJ SOLN: 6.2500 mg | INTRAMUSCULAR | Status: DC | PRN

## 2012-08-06 MED ORDER — LACTATED RINGERS IV SOLN
INTRAVENOUS | Status: DC | PRN
  Administered 2012-08-06: 07:00:00 via INTRAVENOUS

## 2012-08-06 MED ORDER — LIDOCAINE HCL 4 % MT SOLN
OROMUCOSAL | Status: DC | PRN
  Administered 2012-08-06: 4 mL via TOPICAL

## 2012-08-06 MED ORDER — SUCCINYLCHOLINE CHLORIDE 20 MG/ML IJ SOLN
INTRAMUSCULAR | Status: DC | PRN
  Administered 2012-08-06: 140 mg via INTRAVENOUS

## 2012-08-06 MED ORDER — ONDANSETRON HCL 4 MG/2ML IJ SOLN
INTRAMUSCULAR | Status: AC
  Filled 2012-08-06: qty 2

## 2012-08-06 MED ORDER — FENTANYL CITRATE 0.05 MG/ML IJ SOLN
INTRAMUSCULAR | Status: DC | PRN
  Administered 2012-08-06: 50 ug via INTRAVENOUS
  Administered 2012-08-06: 100 ug via INTRAVENOUS

## 2012-08-06 MED ORDER — ROCURONIUM BROMIDE 100 MG/10ML IV SOLN
INTRAVENOUS | Status: DC | PRN
  Administered 2012-08-06: 5 mg via INTRAVENOUS
  Administered 2012-08-06 (×2): 10 mg via INTRAVENOUS

## 2012-08-06 MED ORDER — OXYCODONE HCL 5 MG PO TABS: 5.0000 mg | ORAL_TABLET | Freq: Once | ORAL | Status: DC | PRN

## 2012-08-06 MED ORDER — HYDROMORPHONE HCL PF 1 MG/ML IJ SOLN: 0.2500 mg | INTRAMUSCULAR | Status: DC | PRN

## 2012-08-06 MED ORDER — PROMETHAZINE HCL 25 MG/ML IJ SOLN
INTRAMUSCULAR | Status: AC
  Filled 2012-08-06: qty 1

## 2012-08-06 MED ORDER — ONDANSETRON HCL 4 MG/2ML IJ SOLN
4.0000 mg | Freq: Once | INTRAMUSCULAR | Status: AC | PRN
  Administered 2012-08-06: 4 mg via INTRAVENOUS

## 2012-08-06 MED ORDER — GLYCOPYRROLATE 0.2 MG/ML IJ SOLN
INTRAMUSCULAR | Status: DC | PRN
  Administered 2012-08-06: 0.4 mg via INTRAVENOUS

## 2012-08-06 MED ORDER — EPINEPHRINE HCL 1 MG/ML IJ SOLN
INTRAMUSCULAR | Status: AC
  Filled 2012-08-06: qty 1

## 2012-08-06 MED ORDER — ARTIFICIAL TEARS OP OINT
TOPICAL_OINTMENT | OPHTHALMIC | Status: DC | PRN
  Administered 2012-08-06: 1 via OPHTHALMIC

## 2012-08-06 MED ORDER — PROPOFOL 10 MG/ML IV BOLUS
INTRAVENOUS | Status: DC | PRN
  Administered 2012-08-06: 120 mg via INTRAVENOUS

## 2012-08-06 MED ORDER — ONDANSETRON HCL 4 MG/2ML IJ SOLN
INTRAMUSCULAR | Status: DC | PRN
  Administered 2012-08-06: 4 mg via INTRAVENOUS

## 2012-08-06 MED ORDER — EPHEDRINE SULFATE 50 MG/ML IJ SOLN
INTRAMUSCULAR | Status: DC | PRN
  Administered 2012-08-06: 15 mg via INTRAVENOUS
  Administered 2012-08-06: 10 mg via INTRAVENOUS

## 2012-08-06 MED ORDER — DEXAMETHASONE SODIUM PHOSPHATE 4 MG/ML IJ SOLN
INTRAMUSCULAR | Status: DC | PRN
  Administered 2012-08-06: 4 mg via INTRAVENOUS

## 2012-08-06 MED ORDER — PROMETHAZINE HCL 25 MG/ML IJ SOLN
6.2500 mg | Freq: Once | INTRAMUSCULAR | Status: AC
  Administered 2012-08-06: 6.25 mg via INTRAVENOUS

## 2012-08-06 MED ORDER — SCOPOLAMINE 1 MG/3DAYS TD PT72
MEDICATED_PATCH | TRANSDERMAL | Status: AC
  Administered 2012-08-06: 1 via TRANSDERMAL
  Filled 2012-08-06: qty 1

## 2012-08-06 SURGICAL SUPPLY — 41 items
BRUSH CYTOL CELLEBRITY 1.5X140 (MISCELLANEOUS) ×2 IMPLANT
BRUSH SUPERTRAX BIOPSY (INSTRUMENTS) ×1 IMPLANT
BRUSH SUPERTRAX NDL-TIP CYTO (INSTRUMENTS) ×1 IMPLANT
CANISTER SUCTION 2500CC (MISCELLANEOUS) ×2 IMPLANT
CHANNEL WORK EXTEND EDGE 180 (KITS) IMPLANT
CHANNEL WORK EXTEND EDGE 45 (KITS) IMPLANT
CHANNEL WORK EXTEND EDGE 90 (KITS) IMPLANT
CLOTH BEACON ORANGE TIMEOUT ST (SAFETY) ×2 IMPLANT
CONT SPEC 4OZ CLIKSEAL STRL BL (MISCELLANEOUS) ×4 IMPLANT
COVER TABLE BACK 60X90 (DRAPES) ×2 IMPLANT
FILTER STRAW FLUID ASPIR (MISCELLANEOUS) IMPLANT
FORCEPS BIOP RJ4 1.8 (CUTTING FORCEPS) IMPLANT
FORCEPS BIOP SUPERTRX PREMAR (INSTRUMENTS) ×1 IMPLANT
GLOVE BIOGEL M STRL SZ7.5 (GLOVE) ×4 IMPLANT
GLOVE BIOGEL PI IND STRL 6.5 (GLOVE) IMPLANT
GLOVE BIOGEL PI INDICATOR 6.5 (GLOVE) ×1
GOWN STRL NON-REIN LRG LVL3 (GOWN DISPOSABLE) ×1 IMPLANT
KIT LOCATABLE GUIDE (CANNULA) IMPLANT
KIT MARKER FIDUCIAL DELIVERY (KITS) IMPLANT
KIT PROCEDURE EDGE 180 (KITS) IMPLANT
KIT PROCEDURE EDGE 45 (KITS) IMPLANT
KIT PROCEDURE EDGE 90 (KITS) ×1 IMPLANT
KIT ROOM TURNOVER OR (KITS) ×2 IMPLANT
MARKER SKIN DUAL TIP RULER LAB (MISCELLANEOUS) ×2 IMPLANT
NDL BIOPSY TRANSBRONCH 21G (NEEDLE) IMPLANT
NDL SUPERTRX PREMARK BIOPSY (NEEDLE) IMPLANT
NEEDLE BIOPSY TRANSBRONCH 21G (NEEDLE) IMPLANT
NEEDLE SUPERTRX PREMARK BIOPSY (NEEDLE) IMPLANT
NEEDLE SYS SONOTIP II EBUSTBNA (NEEDLE) ×1 IMPLANT
NS IRRIG 1000ML POUR BTL (IV SOLUTION) ×2 IMPLANT
OIL SILICONE PENTAX (PARTS (SERVICE/REPAIRS)) ×2 IMPLANT
PAD ARMBOARD 7.5X6 YLW CONV (MISCELLANEOUS) ×4 IMPLANT
PATCHES PATIENT (LABEL) ×2 IMPLANT
SPONGE GAUZE 4X4 12PLY (GAUZE/BANDAGES/DRESSINGS) ×3 IMPLANT
SYR 20CC LL (SYRINGE) ×2 IMPLANT
SYR 20ML ECCENTRIC (SYRINGE) ×4 IMPLANT
SYR 5ML LUER SLIP (SYRINGE) ×2 IMPLANT
SYRINGE TOOMEY DISP (SYRINGE) ×2 IMPLANT
TOWEL OR 17X24 6PK STRL BLUE (TOWEL DISPOSABLE) ×2 IMPLANT
TRAP SPECIMEN MUCOUS 40CC (MISCELLANEOUS) ×2 IMPLANT
TUBE CONNECTING 12X1/4 (SUCTIONS) ×4 IMPLANT

## 2012-08-06 NOTE — Anesthesia Procedure Notes (Addendum)
Procedure Name: Intubation Date/Time: 08/06/2012 7:44 AM Performed by: Gayla Medicus Pre-anesthesia Checklist: Patient identified, Timeout performed, Emergency Drugs available, Suction available and Patient being monitored Patient Re-evaluated:Patient Re-evaluated prior to inductionOxygen Delivery Method: Circle system utilized Preoxygenation: Pre-oxygenation with 100% oxygen Intubation Type: IV induction, Rapid sequence and Cricoid Pressure applied Laryngoscope Size: Mac and 3 Grade View: Grade II Tube type: Oral Tube size: 8.5 mm Number of attempts: 1 Airway Equipment and Method: Stylet and LTA kit utilized Placement Confirmation: ETT inserted through vocal cords under direct vision,  positive ETCO2 and breath sounds checked- equal and bilateral Secured at: 21 cm Tube secured with: Tape Dental Injury: Teeth and Oropharynx as per pre-operative assessment  Comments: RSI with cricoid planned d/t pt feeling nauseated. KH

## 2012-08-06 NOTE — H&P (View-Only) (Signed)
    [facility] [phone]   [phone]  OFFICE PROGRESS NOTE  Cross,[name], MD [address]  [place] [address]  DIAGNOSIS:  #1 non-Hodgkin's lymphoma diagnosed on bone marrow biopsy in June of 2012  #2 stage III (T3a N0M0) renal cell carcinoma diagnosed in June of 2012   PRIOR THERAPY:  1) Status post right nephrectomy under the care of Dr. Dahlstedt on 09/02/2010.  2) Systemic chemotherapy with CHOP/Rituxan given every 3 weeks, status post 6 cycles.   CURRENT THERAPY: Observation.   INTERVAL HISTORY: Melissa Cross 79 y.o. female returns to the clinic today for follow up visit accompanied by her son Melissa Cross. The patient has no complaints today except for mild fatigue. She denied having any significant chest pain, shortness breath, cough or hemoptysis. She denied having any significant weight loss or night sweats. She has no nausea or vomiting. She was found on recent CT scan of the chest to have questionable right lung nodule in addition to subcarinal lymphadenopathy. The patient had a PET scan performed recently for further evaluation of this lesions and she is here today for discussion of his scan results and recommendation regarding treatment of her condition.  MEDICAL HISTORY: Past Medical History  Diagnosis Date  . Hypertension   . Hypercholesteremia   . Glaucoma   . Arthritis   . Anemia   . Stroke     hx of TIA  . Blood transfusion   . Hypothyroidism   . Hypothyroid     on synthroid   . Thrush 12/17/2010  . Renal cell carcinoma   . Non Hodgkin's lymphoma   . Cancer     kidney cancer, NHL   . Cancer     last chemo 11/04/10     ALLERGIES:  is allergic to crab and dilaudid.  MEDICATIONS:  Current Outpatient Prescriptions  Medication Sig Dispense Refill  . aspirin EC 81 MG tablet Take 81 mg by mouth at bedtime.       . Calcium Citrate (CITRACAL PO) Take 1 tablet by mouth 3 (three) times a week.       .  cholecalciferol (VITAMIN D) 400 UNITS TABS Take 400 Units by mouth daily.        . Coenzyme Q10 100 MG TABS Take 1 tablet by mouth daily.      . Garlic 1000 MG CAPS Take 1,000 mg by mouth daily.        . Ginger, Zingiber officinalis, (GINGER ROOT) 550 MG CAPS Take 550 mg by mouth daily.        . levothyroxine (SYNTHROID, LEVOTHROID) 50 MCG tablet Take 50 mcg by mouth every morning.       . Multiple Vitamin (MULITIVITAMIN WITH MINERALS) TABS Take 1 tablet by mouth daily.        . nadolol (CORGARD) 40 MG tablet Take 40 mg by mouth at bedtime.       . simvastatin (ZOCOR) 20 MG tablet Take 7 mg by mouth at bedtime. She takes a third of a tablet.      . UNABLE TO FIND Take 2,000 mg by mouth daily. Wheat grass tablets      . vitamin B-12 (CYANOCOBALAMIN) 1000 MCG tablet Place 1,000 mcg under the tongue daily.        No current facility-administered medications for this visit.   Facility-Administered Medications Ordered in Other Visits  Medication Dose Route Frequency Provider Last Rate Last Dose  . sodium chloride 0.9 %   injection 10 mL  10 mL Intravenous PRN Fidelia Cathers, MD   10 mL at 05/17/12 0846    SURGICAL HISTORY:  Past Surgical History  Procedure Laterality Date  . Nephrectomy      Right Side  . Tonsillectomy    . Portacath placement      right subclavian    REVIEW OF SYSTEMS:  A comprehensive review of systems was negative.   PHYSICAL EXAMINATION: General appearance: alert, cooperative, fatigued and no distress Head: Normocephalic, without obvious abnormality, atraumatic Neck: no adenopathy Lymph nodes: Cervical, supraclavicular, and axillary nodes normal. Resp: clear to auscultation bilaterally Cardio: regular rate and rhythm, S1, S2 normal, no murmur, click, rub or gallop GI: soft, non-tender; bowel sounds normal; no masses,  no organomegaly Extremities: extremities normal, atraumatic, no cyanosis or edema  ECOG PERFORMANCE STATUS: 1 - Symptomatic but completely  ambulatory  Blood pressure 148/82, pulse 76, temperature 97.6 F (36.4 C), temperature source Oral, resp. rate 18, height 5' 3" (1.6 m), weight 118 lb (53.524 kg).  LABORATORY DATA: Lab Results  Component Value Date   WBC 5.4 07/02/2012   HGB 13.5 07/02/2012   HCT 40.3 07/02/2012   MCV 97.6 07/02/2012   PLT 196 07/02/2012      Chemistry      Component Value Date/Time   NA 143 07/02/2012 0805   NA 140 09/27/2011 1600   K 4.0 07/02/2012 0805   K 3.7 09/27/2011 1600   CL 102 04/01/2012 0805   CL 103 09/27/2011 1600   CO2 30* 07/02/2012 0805   CO2 29 09/27/2011 1600   BUN 19.5 07/02/2012 0805   BUN 14 09/27/2011 1600   CREATININE 0.9 07/02/2012 0805   CREATININE 0.84 09/27/2011 1600      Component Value Date/Time   CALCIUM 9.7 07/02/2012 0805   CALCIUM 9.5 09/27/2011 1600   ALKPHOS 100 07/02/2012 0805   ALKPHOS 106 03/12/2011 0842   AST 18 07/02/2012 0805   AST 22 03/12/2011 0842   ALT 17 07/02/2012 0805   ALT 12 03/12/2011 0842   BILITOT 0.65 07/02/2012 0805   BILITOT 0.4 03/12/2011 0842       RADIOGRAPHIC STUDIES: Ct Chest Wo Contrast  07/02/2012   *RADIOLOGY REPORT*  Clinical Data: Restaging renal cell carcinoma  CT CHEST WITHOUT CONTRAST  Technique:  Multidetector CT imaging of the chest was performed following the standard protocol without IV contrast.  Comparison: 04/01/12  Findings: There is no pleural effusion identified.  Subpleural nodule within the anterior right middle lobe measures 1.5 cm, image 36/series 4.  Previously 1.1 cm.  Slightly more caudal are two right middle lobe enlarging nodules.  The largest measures 1.7 cm, image 39/series 4.  Enlarging right middle lobe subpleural nodule measures 6 mm, image 30/series 4.  Previously 3 mm.  Heart size appears normal.  There is no pericardial effusion. Calcifications involve the thoracic aorta, LAD and left circumflex coronary artery.  The ascending thoracic aorta is mildly increased in diameter measuring 4.1 cm, image 34/series 2.   Subcarinal lymph node measures 1.1 cm, image 28/series 2.  This is a new finding from previous exam.  No axillary or supraclavicular adenopathy.  Review of the visualized bony structures is significant for osteopenia and spondylosis. Incidental imaging incidental imaging through the upper abdomen shows a stable 1.3 cm low attenuation structure in the right hepatic lobe.  The adrenal glands are stable in appearance.  IMPRESSION:  1.  Interval increase in size of right middle lobe pulmonary nodules   which are suspicious for metastatic disease. 2. New and enlarge subcarinal lymph node is worrisome for metastatic adenopathy. 3.  Mild aneurysmal dilatation of the ascending thoracic aorta.   Original Report Authenticated By: Taylor Stroud, M.D.   Nm Pet Image Initial (pi) Skull Base To Thigh  07/13/2012   *RADIOLOGY REPORT*  Clinical Data: Initial treatment strategy for pulmonary nodules. History of non-Hodgkins lymphoma and renal cell carcinoma.  NUCLEAR MEDICINE PET SKULL BASE TO THIGH  Fasting Blood Glucose:  102  Technique:  18.3 mCi F-18 FDG was injected intravenously. CT data was obtained and used for attenuation correction and anatomic localization only.  (This was not acquired as a diagnostic CT examination.) Additional exam technical data entered on technologist worksheet.  Comparison:  07/02/2012 chest CT.  04/01/2012 chest, abdomen, and pelvic CTs.  Prior PET of 06/24/2010.  Findings:  Neck: No abnormal hypermetabolism.  Chest:  Hypermetabolic subcarinal node which measures 1.5 cm and a S.U.V. max of 10.1 on image 76/series 2.  Hypermetabolism corresponding to 2 adjacent right middle lobe lung nodules.  These measure up to 1.9 cm and a S.U.V. max of 6.2 on image 88/series 2.  Abdomen/Pelvis:  Equivocal hypermetabolism in the region of mild left adrenal nodularity.  Example image 115/series 2.  Skeleton:  No abnormal marrow activity.  CT  images performed for attenuation correction demonstrate no significant  findings within the neck.  A right-sided Port-A-Cath which terminates at the low SVC.  Other chest findings deferred to recent diagnostic CT.  Right nephrectomy.  Motion degraded evaluation of the abdomen. Right hepatic lobe cyst.  Redemonstration of a left adnexal cystic lesion at 3.0 cm on image 171/series 2.  IMPRESSION:  1.  Hypermetabolic subcarinal node, suspicious for metastatic disease. 2.  Hypermetabolism corresponding to peripheral right middle lobe nodules.  Also suspicious for metastasis.  Infectious etiology of the pulmonary findings is felt less likely. 3.  Equivocal left adrenal hypermetabolism with nodularity.  This warrants followup attention.   Original Report Authenticated By: Kyle Talbot, M.D.    ASSESSMENT AND PLAN: this is a very pleasant 79 years old white female with history of non-Hodgkin lymphoma, status post systemic chemotherapy with CHOP/Rituxan in addition to history of stage III renal cell carcinoma status post right nephrectomy. The recent CT scan of the chest in addition to the PET scan showed right middle lobe lung nodule that is hypermetabolic in addition to hypermetabolic subcarinal lymphadenopathy, suspicious to be related to her renal cell carcinoma versus primary lung carcinoma. I have a lengthy discussion with the patient and her son today and I showed them the images of the PET scan.  I initially recommended for the patient to proceed with CT guided core biopsy of the right middle lobe lung nodule by interventional radiology but I received a call from Dr.watts indicating that there is a high risk for bleeding with biopsy of the right middle lobe lung nodule if it is metastatic disease from renal cell carcinoma. He recommended consideration of needle biopsy of the subcarinal lymph nodes via bronchoscopy and EBUS but endoscopic ultrasound by gastroenterology is another option. I will consult with pulmonary/cardiothoracic surgery for consideration of bronchoscopy and  EBUS with biopsy of the subcarinal lymph node.   The patient would come back for follow up visit in 2 weeks for reevaluation and discussion of her biopsy results and treatment options. She was advised to call immediately if she has any concerning symptoms in the interval.  All questions were answered. The patient   knows to call the clinic with any problems, questions or concerns. We can certainly see the patient much sooner if necessary.        

## 2012-08-06 NOTE — Progress Notes (Signed)
Nausea is better

## 2012-08-06 NOTE — Anesthesia Preprocedure Evaluation (Addendum)
Anesthesia Evaluation  Patient identified by MRN, date of birth, ID band Patient awake    Reviewed: Allergy & Precautions, H&P , NPO status , Patient's Chart, lab work & pertinent test results, reviewed documented beta blocker date and time   History of Anesthesia Complications (+) PONV  Airway Mallampati: II TM Distance: >3 FB Neck ROM: Full    Dental  (+) Poor Dentition and Dental Advisory Given   Pulmonary shortness of breath,          Cardiovascular hypertension, Pt. on medications and Pt. on home beta blockers     Neuro/Psych  Headaches, TIA Neuromuscular disease    GI/Hepatic   Endo/Other  Hypothyroidism   Renal/GU Renal diseaseHx renal cell carcinoma     Musculoskeletal   Abdominal   Peds  Hematology   Anesthesia Other Findings   Reproductive/Obstetrics                          Anesthesia Physical Anesthesia Plan  ASA: III  Anesthesia Plan: General   Post-op Pain Management:    Induction: Intravenous  Airway Management Planned: Oral ETT  Additional Equipment:   Intra-op Plan:   Post-operative Plan: Extubation in OR  Informed Consent: I have reviewed the patients History and Physical, chart, labs and discussed the procedure including the risks, benefits and alternatives for the proposed anesthesia with the patient or authorized representative who has indicated his/her understanding and acceptance.   Dental advisory given  Plan Discussed with: CRNA and Surgeon  Anesthesia Plan Comments:       Anesthesia Quick Evaluation

## 2012-08-06 NOTE — Anesthesia Postprocedure Evaluation (Signed)
Anesthesia Post Note  Patient: Melissa Cross  Procedure(s) Performed: Procedure(s) (LRB): VIDEO BRONCHOSCOPY WITH ENDOBRONCHIAL ULTRASOUND (N/A) VIDEO BRONCHOSCOPY WITH ENDOBRONCHIAL NAVIGATION (N/A)  Anesthesia type: general  Patient location: PACU  Post pain: Pain level controlled  Post assessment: Patient's Cardiovascular Status Stable  Last Vitals:  Filed Vitals:   08/06/12 1018  BP:   Pulse:   Temp: 36.3 C  Resp:     Post vital signs: Reviewed and stable  Level of consciousness: sedated  Complications: No apparent anesthesia complications

## 2012-08-06 NOTE — Progress Notes (Signed)
Patient discharged to home with family,  Rates nausea as mild.  Advised to rest today.

## 2012-08-06 NOTE — Transfer of Care (Signed)
Immediate Anesthesia Transfer of Care Note  Patient: Melissa Cross  Procedure(s) Performed: Procedure(s): VIDEO BRONCHOSCOPY WITH ENDOBRONCHIAL ULTRASOUND (N/A) VIDEO BRONCHOSCOPY WITH ENDOBRONCHIAL NAVIGATION (N/A)  Patient Location: PACU  Anesthesia Type:General  Level of Consciousness: awake, alert  and oriented  Airway & Oxygen Therapy: Patient Spontanous Breathing and Patient connected to nasal cannula oxygen  Post-op Assessment: Report given to PACU RN and Post -op Vital signs reviewed and stable  Post vital signs: Reviewed and stable  Complications: No apparent anesthesia complications

## 2012-08-06 NOTE — Interval H&P Note (Signed)
PCCM Interval  Pt presents for bx of R spiculated nodule and mediastinal LAD  Filed Vitals:   08/06/12 0600 08/06/12 0605 08/06/12 0608  BP: 207/91 200/89 192/86  Pulse: 68    Temp: 97 F (36.1 C)    TempSrc: Oral    Resp: 17    SpO2: 100%      Plans: Discussed case with Pt and Dr Arbutus Ped.  Planning for EBUS +/- ENB to bx these lesions. No barriers to procedure.   Levy Pupa, MD, PhD 08/06/2012, 7:39 AM [facility] [phone] or if no answer [phone]

## 2012-08-06 NOTE — Progress Notes (Signed)
Restarted LR at 200cc/hr for unremitting nausea via existing PIV and left arm

## 2012-08-06 NOTE — Preoperative (Signed)
Beta Blockers   Reason not to administer Beta Blockers:Not Applicable 

## 2012-08-06 NOTE — Op Note (Signed)
Video Bronchoscopy with Endobronchial Ultrasound and Electromagnetic Navigation Procedure Note  Date of Operation: 08/06/2012  Pre-op Diagnosis: RLL nodules and subcarinal lymphadenopathy  Post-op Diagnosis: same  Surgeon: Levy Pupa  Assistants: none   Anesthesia: General endotracheal anesthesia  Operation: Flexible video fiberoptic bronchoscopy with endobronchial ultrasound and biopsies.  Estimated Blood Loss: 15cc  Complications: none apparent  Indications and History: Melissa Cross is a 79 y.o. female with history of lymphoma and renal cell carcinoma. CT scan of the chest from 07/02/12 and subsequent PET scan from 07/13/12 identified hypermetabolic right lower lobe nodules and a subcarinal lymph node. Condition was made for tissue biopsy via bronchoscopy with endobronchial ultrasound and electromagnetic navigation.  The risks, benefits, complications, treatment options and expected outcomes were discussed with the patient.  The possibilities of pneumothorax, pneumonia, reaction to medication, pulmonary aspiration, perforation of a viscus, bleeding, failure to diagnose a condition and creating a complication requiring transfusion or operation were discussed with the patient who freely signed the consent.    Description of Procedure: The patient was examined in the preoperative area and history and data from the preprocedure consultation were reviewed. It was deemed appropriate to proceed.  The patient was taken to OR 10, identified as Melissa Cross and the procedure verified as Flexible Video Fiberoptic Bronchoscopy.  A Time Out was held and the above information confirmed. General anesthesia was initiated and the patient  was orally intubated. The video fiberoptic bronchoscope was introduced via the endotracheal tube and a general inspection was performed which showed normal airways throughout. There were no abnormal secretions or endobronchial lesions seen. The standard scope was then  withdrawn and the endobronchial ultrasound was used to identify and characterize the peritracheal, hilar and bronchial lymph nodes. Inspection showed enlargement of the subcarinal Station 7 lymph node. All other nodes were of normal size. Using real-time ultrasound guidance Wang needle biopsies were take from Station 7 node and were sent for cytology and flow cytometry.   Prior to the date of the procedure a high-resolution CT scan of the chest was performed. Utilizing superDimension software a virtual tracheobronchial tree was generated to allow the creation of distinct navigation pathways to the patient's right lower lobe nodules. The standard bronchoscope was reintroduced through the endotracheal tube. The extendable working channel and locator guide were introduced into the bronchoscope. The distinct navigation pathways prepared prior to this procedure were then utilized to navigate to within 0.8-1.5 cm of patient's lesions identified on CT scan. The extendable working channel was secured into place and the locator guide was withdrawn. Under fluoroscopic guidance transbronchial brushings were performed to be sent for cytology. A bronchioalveolar lavage was performed in the right lower lobe and sent for cytology. At the end of the procedure a general airway inspection was performed and there was no evidence of active bleeding. The bronchoscope was removed.  The patient tolerated the procedure well. There was no significant blood loss and there were no obvious complications. A post-procedural chest x-ray is pending.  Samples: 1. Wang needle biopsies from station 7 lymph node 2. Transbronchial brushings from right lower lobe nodules 3. Bronchoalveolar lavage from right lower lobe  Plans:  The patient will be discharged from the PACU to home when recovered from anesthesia and after chest x-ray is reviewed. We will review the cytology results with the patient when they become available. Outpatient followup  will be with Dr Delton Coombes and Dr Arbutus Ped.    Levy Pupa, MD, PhD 08/06/2012, 9:08 AM [facility] [phone] or if no answer [phone]

## 2012-08-10 ENCOUNTER — Encounter (HOSPITAL_COMMUNITY): Payer: Self-pay | Admitting: Emergency Medicine

## 2012-08-11 ENCOUNTER — Other Ambulatory Visit: Payer: Self-pay

## 2012-08-12 ENCOUNTER — Ambulatory Visit (HOSPITAL_BASED_OUTPATIENT_CLINIC_OR_DEPARTMENT_OTHER): Payer: Medicare Other | Admitting: Internal Medicine

## 2012-08-12 VITALS — BP 151/81 | HR 70 | Temp 97.3°F | Resp 18 | Ht 62.0 in | Wt 118.9 lb

## 2012-08-12 DIAGNOSIS — C8589 Other specified types of non-Hodgkin lymphoma, extranodal and solid organ sites: Secondary | ICD-10-CM | POA: Diagnosis not present

## 2012-08-12 DIAGNOSIS — C649 Malignant neoplasm of unspecified kidney, except renal pelvis: Secondary | ICD-10-CM | POA: Diagnosis not present

## 2012-08-12 DIAGNOSIS — C771 Secondary and unspecified malignant neoplasm of intrathoracic lymph nodes: Secondary | ICD-10-CM | POA: Diagnosis not present

## 2012-08-12 DIAGNOSIS — R911 Solitary pulmonary nodule: Secondary | ICD-10-CM | POA: Diagnosis not present

## 2012-08-12 NOTE — Progress Notes (Signed)
[facility] [phone]   [phone]  OFFICE PROGRESS NOTE  Melissa Reeve, MD [address] [place] [address]  DIAGNOSIS:  #1 non-Hodgkin's lymphoma diagnosed on bone marrow biopsy in June of 2012  #2 stage III (T3a N0M0) renal cell carcinoma diagnosed in June of 2012   PRIOR THERAPY:  1) Status post right nephrectomy under the care of Dr. Retta Diones on 09/02/2010.  2) Systemic chemotherapy with CHOP/Rituxan given every 3 weeks, status post 6 cycles.   CURRENT THERAPY: Observation.   INTERVAL HISTORY: Melissa Cross 79 y.o. female returns to the clinic today for follow up visit accompanied by her son Melissa Cross. The patient is feeling fine today with no specific complaints. She denied having any significant chest pain, shortness of breath, cough or hemoptysis. The patient denied having any nausea or vomiting. She recently underwent a video bronchoscopy with endobronchial ultrasound and electromagnetic navigational bronchoscopy under the care of Dr. Delton Coombes on 08/06/2012. The final cytology (Accession: [id number]) from the fine needle aspiration of the subcarinal lymph node showed malignant cells consistent with carcinoma but the tissue sample was not sufficient to identify the type of carcinoma. The patient is here today for evaluation and recommendation regarding her condition.   MEDICAL HISTORY: Past Medical History  Diagnosis Date  . Hypercholesteremia     takes Zocor daily  . Glaucoma   . Anemia   . Stroke     hx of TIA  . Blood transfusion   . Thrush 12/17/2010  . Renal cell carcinoma   . Non Hodgkin's lymphoma   . Cancer     kidney cancer, NHL   . Cancer     last chemo 11/04/10   . Hypertension     takes Nadolol daily  . Hypothyroidism     takes Synthroid daily  . Hypothyroid   . PONV (postoperative nausea and vomiting)   . Shortness of breath     with exertion  . Headache(784.0)     hx of migraines  . TIA  (transient ischemic attack)   . Peripheral neuropathy   . Arthritis     knee /hip  . Scoliosis   . History of bladder infections   . History of blood transfusion   . Cataract     right  . History of shingles     ALLERGIES:  is allergic to crab and dilaudid.  MEDICATIONS:  Current Outpatient Prescriptions  Medication Sig Dispense Refill  . aspirin EC 81 MG tablet Take 81 mg by mouth at bedtime.       . Calcium Citrate (CITRACAL PO) Take 1 tablet by mouth 3 (three) times a week.       . cholecalciferol (VITAMIN D) 400 UNITS TABS Take 400 Units by mouth daily.        . Coenzyme Q10 100 MG TABS Take 1 tablet by mouth daily.      . Garlic 1000 MG CAPS Take 1,000 mg by mouth daily.        . Ginger, Zingiber officinalis, (GINGER ROOT) 550 MG CAPS Take 550 mg by mouth daily.        [name] levothyroxine (SYNTHROID, LEVOTHROID) 50 MCG tablet Take 50 mcg by mouth every morning.       . Multiple Vitamin (MULITIVITAMIN WITH MINERALS) TABS Take 1 tablet by mouth daily.        . nadolol (CORGARD) 40 MG tablet Take 40 mg by mouth at bedtime.       [name]  OVER THE COUNTER MEDICATION Take 2,000 mg by mouth daily. Wheat Grass tablets      . simvastatin (ZOCOR) 20 MG tablet Take 7 mg by mouth at bedtime. She takes a third of a tablet.      . sodium chloride (MURO 128) 5 % ophthalmic solution Place 1 drop into the left eye 3 (three) times daily.      . vitamin B-12 (CYANOCOBALAMIN) 1000 MCG tablet Place 1,000 mcg under the tongue daily.        No current facility-administered medications for this visit.   Facility-Administered Medications Ordered in Other Visits  Medication Dose Route Frequency Provider Last Rate Last Dose  . sodium chloride 0.9 % injection 10 mL  10 mL Intravenous PRN Si Gaul, MD   10 mL at 05/17/12 0846    SURGICAL HISTORY:  Past Surgical History  Procedure Laterality Date  . Nephrectomy      Right Side  . Portacath placement      right subclavian  . Tonsillectomy      as a  child  . Mouth surgery    . Left cataract removed    . Video bronchoscopy with endobronchial ultrasound N/A 08/06/2012    Procedure: VIDEO BRONCHOSCOPY WITH ENDOBRONCHIAL ULTRASOUND;  Surgeon: Leslye Peer, MD;  Location: [facility] OR;  Service: Thoracic;  Laterality: N/A;  . Video bronchoscopy with endobronchial navigation N/A 08/06/2012    Procedure: VIDEO BRONCHOSCOPY WITH ENDOBRONCHIAL NAVIGATION;  Surgeon: Leslye Peer, MD;  Location: [facility] OR;  Service: Thoracic;  Laterality: N/A;    REVIEW OF SYSTEMS:  A comprehensive review of systems was negative.   PHYSICAL EXAMINATION: General appearance: alert, cooperative and no distress Head: Normocephalic, without obvious abnormality, atraumatic Neck: no adenopathy Lymph nodes: Cervical, supraclavicular, and axillary nodes normal. Resp: clear to auscultation bilaterally Cardio: regular rate and rhythm, S1, S2 normal, no murmur, click, rub or gallop GI: soft, non-tender; bowel sounds normal; no masses,  no organomegaly Extremities: extremities normal, atraumatic, no cyanosis or edema  ECOG PERFORMANCE STATUS: 1 - Symptomatic but completely ambulatory  Blood pressure 151/81, pulse 70, temperature 97.3 F (36.3 C), temperature source Oral, resp. rate 18, height 5\' 2"  (1.575 m), weight 118 lb 14.4 oz (53.933 kg).  LABORATORY DATA: Lab Results  Component Value Date   WBC 5.1 08/04/2012   HGB 14.2 08/04/2012   HCT 42.4 08/04/2012   MCV 97.5 08/04/2012   PLT 202 08/04/2012      Chemistry      Component Value Date/Time   NA 139 08/04/2012 0844   NA 143 07/02/2012 0805   K 4.0 08/04/2012 0844   K 4.0 07/02/2012 0805   CL 101 08/04/2012 0844   CL 102 04/01/2012 0805   CO2 30 08/04/2012 0844   CO2 30* 07/02/2012 0805   BUN 16 08/04/2012 0844   BUN 19.5 07/02/2012 0805   CREATININE 1.00 08/04/2012 0844   CREATININE 0.9 07/02/2012 0805      Component Value Date/Time   CALCIUM 9.8 08/04/2012 0844   CALCIUM 9.7 07/02/2012 0805   ALKPHOS 100 07/02/2012 0805    ALKPHOS 106 03/12/2011 0842   AST 18 07/02/2012 0805   AST 22 03/12/2011 0842   ALT 17 07/02/2012 0805   ALT 12 03/12/2011 0842   BILITOT 0.65 07/02/2012 0805   BILITOT 0.4 03/12/2011 0842       RADIOGRAPHIC STUDIES: Dg Chest 2 View  08/04/2012   *RADIOLOGY REPORT*  Clinical Data: Preoperative examination (bronchoscopy with endobronchial ultrasound,  history of smoking  CHEST - 2 VIEW  Comparison: 09/27/2011; PET CT - 07/13/2012; chest CT - 07/02/2012  Findings:  Grossly unchanged cardiac silhouette and mediastinal contours with atherosclerotic plaque within the thoracic aorta and imaged abdominal aorta.  Stable positioning of support apparatus.  The lungs appear mildly hyperexpanded.  There is blunting of the right costophrenic angle without definite pleural effusion.  Adjacent nodular opacities overlying the right mid lung correlate with the hypermetabolic right middle lobe nodule seen on recently performed PET CT.  No new focal airspace opacities.  No evidence of edema.  No pneumothorax.  Scoliotic curvature of the lower thoracic / upper lumbar spine, likely degenerative in etiology.  IMPRESSION: 1.  No acute cardiopulmonary disease. 2.  Grossly unchanged appearance of adjacent nodular opacities which correlate with the hypermetabolic nodules as seen on recently performed PET-CT.   Original Report Authenticated By: Tacey Ruiz, MD   Dg Chest Port 1 View  08/06/2012   *RADIOLOGY REPORT*  Clinical Data: Status post bronchoscopy.  PORTABLE CHEST - 1 VIEW  Comparison: 08/04/2012  Findings: Lung volumes are low.  No evidence for pneumothorax. The cardiopericardial silhouette is enlarged.  Pulmonary nodule noted at the right lung base and better characterized on recent CT scan from 07/02/2012.  Right Port-A-Cath tip projects in the region of the SVC / RA junction. Telemetry leads overlie the chest.  IMPRESSION: Low volume film without evidence for pneumothorax.   Original Report Authenticated By: Kennith Center, M.D.    Dg C-arm Bronchoscopy  08/06/2012   CLINICAL DATA: lymphoma   C-ARM BRONCHOSCOPY  Fluoroscopy was utilized by the requesting physician.  No radiographic  interpretation.     ASSESSMENT AND PLAN: This is a very pleasant 79 years old white female with history of renal cell carcinoma as well as non-Hodgkin lymphoma now presenting with right middle lobe lung mass as well as subcarinal lymphadenopathy with the final diagnosis of carcinoma but the primary is unclear because of insufficient material from the recent fine needle aspiration of the subcarinal lymph node. I discussed the biopsy results with the patient and her son. I will contact interventional radiology to ask about the possibility of proceeding with the core biopsy of the right middle lobe lung nodule for further identification of her malignancy. I discussed the case with Dr. Grace Isaac and he thinks that core biopsy of the right middle lobe lung nodule can be performed on this patient.  I would see the patient back for follow up visit in 2 weeks for evaluation and discussion of the biopsy results and recommendation regarding treatment of her condition. She was advised to call immediately if she has any concerning symptoms in the interval. The patient voices understanding of current disease status and treatment options and is in agreement with the current care plan.  All questions were answered. The patient knows to call the clinic with any problems, questions or concerns. We can certainly see the patient much sooner if necessary.

## 2012-08-13 ENCOUNTER — Other Ambulatory Visit: Payer: Self-pay | Admitting: Radiology

## 2012-08-13 ENCOUNTER — Telehealth: Payer: Self-pay | Admitting: *Deleted

## 2012-08-13 NOTE — Telephone Encounter (Signed)
Tried to call the pt to give her appts for 08/26/12 labs@11 :45am and ov @ 12:15pm, but the line was busy. i will mail a letter/avs...td

## 2012-08-14 ENCOUNTER — Encounter: Payer: Self-pay | Admitting: Internal Medicine

## 2012-08-16 ENCOUNTER — Encounter (HOSPITAL_COMMUNITY): Payer: Self-pay | Admitting: Pharmacy Technician

## 2012-08-17 ENCOUNTER — Other Ambulatory Visit: Payer: Self-pay | Admitting: Radiology

## 2012-08-18 ENCOUNTER — Telehealth: Payer: Self-pay | Admitting: Internal Medicine

## 2012-08-18 NOTE — Telephone Encounter (Signed)
returned pt call to r/s est appt.Melissa KitchenMarland KitchenMarland KitchenDone

## 2012-08-19 ENCOUNTER — Encounter (HOSPITAL_COMMUNITY): Payer: Self-pay

## 2012-08-19 ENCOUNTER — Ambulatory Visit (HOSPITAL_COMMUNITY)
Admission: RE | Admit: 2012-08-19 | Discharge: 2012-08-19 | Disposition: A | Discharge status: Home / Self Care | Payer: Medicare Other | Source: Ambulatory Visit | Attending: Diagnostic Radiology | Admitting: Diagnostic Radiology

## 2012-08-19 ENCOUNTER — Ambulatory Visit (HOSPITAL_COMMUNITY)
Admission: RE | Admit: 2012-08-19 | Discharge: 2012-08-19 | Disposition: A | Discharge status: Home / Self Care | Payer: Medicare Other | Source: Ambulatory Visit | Attending: Internal Medicine | Admitting: Internal Medicine

## 2012-08-19 DIAGNOSIS — J984 Other disorders of lung: Secondary | ICD-10-CM | POA: Diagnosis not present

## 2012-08-19 DIAGNOSIS — C8589 Other specified types of non-Hodgkin lymphoma, extranodal and solid organ sites: Secondary | ICD-10-CM | POA: Diagnosis not present

## 2012-08-19 DIAGNOSIS — Z9221 Personal history of antineoplastic chemotherapy: Secondary | ICD-10-CM | POA: Insufficient documentation

## 2012-08-19 DIAGNOSIS — Z905 Acquired absence of kidney: Secondary | ICD-10-CM | POA: Insufficient documentation

## 2012-08-19 DIAGNOSIS — E7800 Pure hypercholesterolemia, unspecified: Secondary | ICD-10-CM | POA: Insufficient documentation

## 2012-08-19 DIAGNOSIS — Z79899 Other long term (current) drug therapy: Secondary | ICD-10-CM | POA: Insufficient documentation

## 2012-08-19 DIAGNOSIS — E039 Hypothyroidism, unspecified: Secondary | ICD-10-CM | POA: Insufficient documentation

## 2012-08-19 DIAGNOSIS — I10 Essential (primary) hypertension: Secondary | ICD-10-CM | POA: Diagnosis not present

## 2012-08-19 DIAGNOSIS — Z8673 Personal history of transient ischemic attack (TIA), and cerebral infarction without residual deficits: Secondary | ICD-10-CM | POA: Diagnosis not present

## 2012-08-19 DIAGNOSIS — C3490 Malignant neoplasm of unspecified part of unspecified bronchus or lung: Secondary | ICD-10-CM | POA: Diagnosis not present

## 2012-08-19 DIAGNOSIS — Z7982 Long term (current) use of aspirin: Secondary | ICD-10-CM | POA: Diagnosis not present

## 2012-08-19 DIAGNOSIS — C649 Malignant neoplasm of unspecified kidney, except renal pelvis: Secondary | ICD-10-CM | POA: Insufficient documentation

## 2012-08-19 DIAGNOSIS — H409 Unspecified glaucoma: Secondary | ICD-10-CM | POA: Insufficient documentation

## 2012-08-19 DIAGNOSIS — C7800 Secondary malignant neoplasm of unspecified lung: Secondary | ICD-10-CM | POA: Diagnosis not present

## 2012-08-19 DIAGNOSIS — R911 Solitary pulmonary nodule: Secondary | ICD-10-CM

## 2012-08-19 LAB — CBC
MCV: 96.6 fL (ref 78.0–100.0)
Platelets: 186 10*3/uL (ref 150–400)
RBC: 3.83 MIL/uL — ABNORMAL LOW (ref 3.87–5.11)
RDW: 13.3 % (ref 11.5–15.5)
WBC: 4 10*3/uL (ref 4.0–10.5)

## 2012-08-19 LAB — APTT: aPTT: 30 seconds (ref 24–37)

## 2012-08-19 LAB — PROTIME-INR
INR: 1 (ref 0.00–1.49)
Prothrombin Time: 13 seconds (ref 11.6–15.2)

## 2012-08-19 MED ORDER — HEPARIN SOD (PORK) LOCK FLUSH 100 UNIT/ML IV SOLN
500.0000 [IU] | INTRAVENOUS | Status: AC | PRN
  Administered 2012-08-19: 500 [IU]
  Filled 2012-08-19: qty 5

## 2012-08-19 MED ORDER — FENTANYL CITRATE 0.05 MG/ML IJ SOLN
INTRAMUSCULAR | Status: AC | PRN
  Administered 2012-08-19 (×3): 25 ug via INTRAVENOUS

## 2012-08-19 MED ORDER — FENTANYL CITRATE 0.05 MG/ML IJ SOLN
INTRAMUSCULAR | Status: AC
  Filled 2012-08-19: qty 6

## 2012-08-19 MED ORDER — MIDAZOLAM HCL 2 MG/2ML IJ SOLN
INTRAMUSCULAR | Status: AC | PRN
  Administered 2012-08-19 (×3): 0.5 mg via INTRAVENOUS

## 2012-08-19 MED ORDER — MIDAZOLAM HCL 2 MG/2ML IJ SOLN
INTRAMUSCULAR | Status: AC
  Filled 2012-08-19: qty 6

## 2012-08-19 MED ORDER — SODIUM CHLORIDE 0.9 % IV SOLN
INTRAVENOUS | Status: DC
  Administered 2012-08-19: 08:00:00 via INTRAVENOUS

## 2012-08-19 NOTE — Procedures (Signed)
CT guided core biopsies of right middle lobe lesion.  No immediate complication.  Plan for follow up CXR.

## 2012-08-19 NOTE — H&P (Signed)
Melissa Cross is an 79 y.o. female.   Chief Complaint: "I'm having a lung biopsy" HPI: Patient with history of NHL, RCC with prior rt nephrectomy,  RML lung mass and recent bronchoscopic biopsy of a subcarinal lymph node revealing carcinoma of unknown origin. She presents today for CT guided core biopsy of the RML lung mass to further identify type of malignancy.  Past Medical History  Diagnosis Date  . Hypercholesteremia     takes Zocor daily  . Glaucoma   . Anemia   . Stroke     hx of TIA  . Blood transfusion   . Thrush 12/17/2010  . Renal cell carcinoma   . Non Hodgkin's lymphoma   . Cancer     kidney cancer, NHL   . Cancer     last chemo 11/04/10   . Hypertension     takes Nadolol daily  . Hypothyroidism     takes Synthroid daily  . Hypothyroid   . PONV (postoperative nausea and vomiting)   . Shortness of breath     with exertion  . Headache(784.0)     hx of migraines  . TIA (transient ischemic attack)   . Peripheral neuropathy   . Arthritis     knee /hip  . Scoliosis   . History of bladder infections   . History of blood transfusion   . Cataract     right  . History of shingles     Past Surgical History  Procedure Laterality Date  . Nephrectomy      Right Side  . Portacath placement      right subclavian  . Tonsillectomy      as a child  . Mouth surgery    . Left cataract removed    . Video bronchoscopy with endobronchial ultrasound N/A 08/06/2012    Procedure: VIDEO BRONCHOSCOPY WITH ENDOBRONCHIAL ULTRASOUND;  Surgeon: Leslye Peer, MD;  Location: [facility] OR;  Service: Thoracic;  Laterality: N/A;  . Video bronchoscopy with endobronchial navigation N/A 08/06/2012    Procedure: VIDEO BRONCHOSCOPY WITH ENDOBRONCHIAL NAVIGATION;  Surgeon: Leslye Peer, MD;  Location: [facility] OR;  Service: Thoracic;  Laterality: N/A;    Family History  Problem Relation Age of Onset  . Colon cancer Father   . Stroke Mother    Social History:  reports that she has quit smoking.  She does not have any smokeless tobacco history on file. She reports that she does not drink alcohol or use illicit drugs.  Allergies:  Allergies  Allergen Reactions  . Crab [Shellfish Allergy] Nausea And Vomiting  . Dilaudid [Hydromorphone Hcl] Other (See Comments)    Hallucinations and nausea    Current outpatient prescriptions:aspirin EC 81 MG tablet, Take 81 mg by mouth at bedtime. , Disp: , Rfl: ;  Calcium Citrate (CITRACAL PO), Take 1 tablet by mouth 3 (three) times a week. , Disp: , Rfl: ;  cholecalciferol (VITAMIN D) 400 UNITS TABS, Take 400 Units by mouth daily.  , Disp: , Rfl: ;  levothyroxine (SYNTHROID, LEVOTHROID) 50 MCG tablet, Take 50 mcg by mouth every morning. , Disp: , Rfl:  Multiple Vitamin (MULITIVITAMIN WITH MINERALS) TABS, Take 1 tablet by mouth daily.  , Disp: , Rfl: ;  nadolol (CORGARD) 40 MG tablet, Take 40 mg by mouth at bedtime. , Disp: , Rfl: ;  simvastatin (ZOCOR) 20 MG tablet, Take 7 mg by mouth at bedtime. She takes a third of a tablet., Disp: , Rfl: ;  sodium chloride (  MURO 128) 5 % ophthalmic solution, Place 1 drop into the left eye 3 (three) times daily., Disp: , Rfl:  vitamin B-12 (CYANOCOBALAMIN) 1000 MCG tablet, Place 1,000 mcg under the tongue daily. , Disp: , Rfl: ;  Coenzyme Q10 100 MG TABS, Take 1 tablet by mouth daily., Disp: , Rfl: ;  Garlic 1000 MG CAPS, Take 1,000 mg by mouth daily.  , Disp: , Rfl: ;  Ginger, Zingiber officinalis, (GINGER ROOT) 550 MG CAPS, Take 550 mg by mouth daily.  , Disp: , Rfl: ;  OVER THE COUNTER MEDICATION, Take 2,000 mg by mouth daily. Wheat Grass tablets, Disp: , Rfl:  Current facility-administered medications:0.9 %  sodium chloride infusion, , Intravenous, Continuous, Brayton El, PA-C, Last Rate: 20 mL/hr at 08/19/12 0741;  heparin lock flush 100 unit/mL, 500 Units, Intracatheter, Prior to discharge, Abundio Miu, MD Facility-Administered Medications Ordered in Other Encounters: fentaNYL (SUBLIMAZE) 0.05 MG/ML injection, , ,  , ;  midazolam (VERSED) 2 MG/2ML injection, , , , ;  sodium chloride 0.9 % injection 10 mL, 10 mL, Intravenous, PRN, Si Gaul, MD, 10 mL at 05/17/12 0846   Results for orders placed during the hospital encounter of 08/19/12 (from the past 48 hour(s))  APTT     Status: None   Collection Time    08/19/12  7:30 AM      Result Value Range   aPTT 30  24 - 37 seconds  CBC     Status: Abnormal   Collection Time    08/19/12  7:30 AM      Result Value Range   WBC 4.0  4.0 - 10.5 K/uL   RBC 3.83 (*) 3.87 - 5.11 MIL/uL   Hemoglobin 12.5  12.0 - 15.0 g/dL   HCT 13.2  44.0 - 10.2 %   MCV 96.6  78.0 - 100.0 fL   MCH 32.6  26.0 - 34.0 pg   MCHC 33.8  30.0 - 36.0 g/dL   RDW 72.5  36.6 - 44.0 %   Platelets 186  150 - 400 K/uL  PROTIME-INR     Status: None   Collection Time    08/19/12  7:30 AM      Result Value Range   Prothrombin Time 13.0  11.6 - 15.2 seconds   INR 1.00  0.00 - 1.49   No results found.  Review of Systems  Constitutional: Negative for fever and chills.  Respiratory: Negative for hemoptysis and shortness of breath.        Occ cough  Cardiovascular: Negative for chest pain.  Gastrointestinal: Negative for nausea, vomiting and abdominal pain.  Musculoskeletal: Negative for back pain.  Neurological: Negative for headaches.  Endo/Heme/Allergies: Does not bruise/bleed easily.    Blood pressure 162/81, pulse 70, temperature 98.2 F (36.8 C), temperature source Oral, resp. rate 16, SpO2 99.00%. Physical Exam  Constitutional: She is oriented to person, place, and time. She appears well-developed and well-nourished.  Cardiovascular: Normal rate and regular rhythm.   Respiratory: Effort normal.  Few bibasilar crackles, greater on right; rt chest wall PAC intact, clean and dry  GI: Bowel sounds are normal. There is no tenderness.  Musculoskeletal: Normal range of motion. She exhibits no edema.  Neurological: She is alert and oriented to person, place, and time.      Assessment/Plan Pt with hx NHL,RCC, RML lung mass and recent bronchoscopic biopsy of a subcarinal lymph node revealing carcinoma of unknown origin. Plan is for CT guided core biopsy of the RML  lung mass today to further identify type of malignancy. Details/risks of procedure d/w pt/son with their understanding and consent.  ALLRED,D KEVIN 08/19/2012, 8:19 AM

## 2012-08-24 ENCOUNTER — Inpatient Hospital Stay: Payer: Medicare Other | Admitting: Emergency Medicine

## 2012-08-26 ENCOUNTER — Other Ambulatory Visit: Payer: Medicare Other | Admitting: Lab

## 2012-08-26 ENCOUNTER — Ambulatory Visit: Payer: Medicare Other | Admitting: Internal Medicine

## 2012-08-30 ENCOUNTER — Other Ambulatory Visit (HOSPITAL_BASED_OUTPATIENT_CLINIC_OR_DEPARTMENT_OTHER): Payer: Medicare Other | Admitting: Lab

## 2012-08-30 ENCOUNTER — Encounter: Payer: Self-pay | Admitting: Internal Medicine

## 2012-08-30 ENCOUNTER — Ambulatory Visit (HOSPITAL_BASED_OUTPATIENT_CLINIC_OR_DEPARTMENT_OTHER): Payer: Medicare Other | Admitting: Internal Medicine

## 2012-08-30 ENCOUNTER — Telehealth: Payer: Self-pay | Admitting: Internal Medicine

## 2012-08-30 VITALS — BP 153/89 | HR 66 | Temp 97.4°F | Resp 20 | Ht 62.0 in | Wt 118.3 lb

## 2012-08-30 DIAGNOSIS — D709 Neutropenia, unspecified: Secondary | ICD-10-CM

## 2012-08-30 DIAGNOSIS — C649 Malignant neoplasm of unspecified kidney, except renal pelvis: Secondary | ICD-10-CM | POA: Diagnosis not present

## 2012-08-30 DIAGNOSIS — C8589 Other specified types of non-Hodgkin lymphoma, extranodal and solid organ sites: Secondary | ICD-10-CM | POA: Diagnosis not present

## 2012-08-30 DIAGNOSIS — C641 Malignant neoplasm of right kidney, except renal pelvis: Secondary | ICD-10-CM

## 2012-08-30 DIAGNOSIS — C8330 Diffuse large B-cell lymphoma, unspecified site: Secondary | ICD-10-CM

## 2012-08-30 LAB — CBC WITH DIFFERENTIAL/PLATELET
Basophils Absolute: 0 10*3/uL (ref 0.0–0.1)
EOS%: 2.6 % (ref 0.0–7.0)
Eosinophils Absolute: 0.1 10*3/uL (ref 0.0–0.5)
HGB: 13.4 g/dL (ref 11.6–15.9)
LYMPH%: 15.4 % (ref 14.0–49.7)
MCH: 32.9 pg (ref 25.1–34.0)
MCV: 96.9 fL (ref 79.5–101.0)
MONO%: 9.1 % (ref 0.0–14.0)
NEUT#: 3.1 10*3/uL (ref 1.5–6.5)
NEUT%: 72.4 % (ref 38.4–76.8)
Platelets: 202 10*3/uL (ref 145–400)

## 2012-08-30 MED ORDER — PAZOPANIB HCL 200 MG PO TABS: 800.0000 mg | ORAL_TABLET | Freq: Every day | ORAL | Status: DC

## 2012-08-30 NOTE — Progress Notes (Signed)
[facility] [phone]   [phone]  OFFICE PROGRESS NOTE  [name], MD [address] [place] [address]  DIAGNOSIS:  #1 non-Hodgkin's lymphoma diagnosed on bone marrow biopsy in June of 2012  #2 metastatic renal cell carcinoma initially diagnosed as stage III (T3a N0M0) renal cell carcinoma diagnosed in June of 2012 with pulmonary metastasis diagnosed in August of 2014.  PRIOR THERAPY:  1) Status post right nephrectomy under the care of Dr. Retta Diones on 09/02/2010.  2) Systemic chemotherapy with CHOP/Rituxan given every 3 weeks, status post 6 cycles.   CURRENT THERAPY: Votrient 800 mg by mouth daily.    INTERVAL HISTORY: Melissa Cross 79 y.o. female returns to the clinic today for followup visit accompanied by her son. The patient is feeling fine today with no specific complaints except for mild fatigue. She recently underwent CT-guided core biopsy of the right middle lobe lung nodule under the care of interventional radiology and the final pathology was consistent with metastatic renal cell carcinoma. She is here today for evaluation and recommendation regarding treatment of her condition. She denied having any significant weight loss or night sweats. The patient denied having any chest pain, shortness breath, cough or hemoptysis.  MEDICAL HISTORY: Past Medical History  Diagnosis Date  . Hypercholesteremia     takes Zocor daily  . Glaucoma   . Anemia   . Stroke     hx of TIA  . Blood transfusion   . Thrush 12/17/2010  . Renal cell carcinoma   . Non Hodgkin's lymphoma   . Cancer     kidney cancer, NHL   . Cancer     last chemo 11/04/10   . Hypertension     takes Nadolol daily  . Hypothyroidism     takes Synthroid daily  . Hypothyroid   . PONV (postoperative nausea and vomiting)   . Shortness of breath     with exertion  . Headache(784.0)     hx of migraines  . TIA (transient ischemic attack)   .  Peripheral neuropathy   . Arthritis     knee /hip  . Scoliosis   . History of bladder infections   . History of blood transfusion   . Cataract     right  . History of shingles     ALLERGIES:  is allergic to crab and dilaudid.  MEDICATIONS:  Current Outpatient Prescriptions  Medication Sig Dispense Refill  . aspirin EC 81 MG tablet Take 81 mg by mouth at bedtime.       . Calcium Citrate (CITRACAL PO) Take 1 tablet by mouth 3 (three) times a week.       . cholecalciferol (VITAMIN D) 400 UNITS TABS Take 400 Units by mouth daily.        . Coenzyme Q10 100 MG TABS Take 1 tablet by mouth daily.      . Garlic 1000 MG CAPS Take 1,000 mg by mouth daily.        . Ginger, Zingiber officinalis, (GINGER ROOT) 550 MG CAPS Take 550 mg by mouth daily.        [name] levothyroxine (SYNTHROID, LEVOTHROID) 50 MCG tablet Take 50 mcg by mouth every morning.       . Multiple Vitamin (MULITIVITAMIN WITH MINERALS) TABS Take 1 tablet by mouth daily.        . nadolol (CORGARD) 40 MG tablet Take 40 mg by mouth at bedtime.       [name]  OVER THE COUNTER MEDICATION Take 2,000 mg by mouth daily. Wheat Grass tablets      . simvastatin (ZOCOR) 20 MG tablet Take 7 mg by mouth at bedtime. She takes a third of a tablet.      . sodium chloride (MURO 128) 5 % ophthalmic solution Place 1 drop into the left eye 3 (three) times daily.      . vitamin B-12 (CYANOCOBALAMIN) 1000 MCG tablet Place 1,000 mcg under the tongue daily.        No current facility-administered medications for this visit.   Facility-Administered Medications Ordered in Other Visits  Medication Dose Route Frequency Provider Last Rate Last Dose  . sodium chloride 0.9 % injection 10 mL  10 mL Intravenous PRN Si Gaul, MD   10 mL at 05/17/12 0846    SURGICAL HISTORY:  Past Surgical History  Procedure Laterality Date  . Nephrectomy      Right Side  . Portacath placement      right subclavian  . Tonsillectomy      as a child  . Mouth surgery    .  Left cataract removed    . Video bronchoscopy with endobronchial ultrasound N/A 08/06/2012    Procedure: VIDEO BRONCHOSCOPY WITH ENDOBRONCHIAL ULTRASOUND;  Surgeon: Leslye Peer, MD;  Location: [facility] OR;  Service: Thoracic;  Laterality: N/A;  . Video bronchoscopy with endobronchial navigation N/A 08/06/2012    Procedure: VIDEO BRONCHOSCOPY WITH ENDOBRONCHIAL NAVIGATION;  Surgeon: Leslye Peer, MD;  Location: [facility] OR;  Service: Thoracic;  Laterality: N/A;    REVIEW OF SYSTEMS:  A comprehensive review of systems was negative except for: Constitutional: positive for fatigue   PHYSICAL EXAMINATION: General appearance: alert, cooperative, fatigued and no distress Head: Normocephalic, without obvious abnormality, atraumatic Neck: no adenopathy Lymph nodes: Cervical, supraclavicular, and axillary nodes normal. Resp: clear to auscultation bilaterally Cardio: regular rate and rhythm, S1, S2 normal, no murmur, click, rub or gallop GI: soft, non-tender; bowel sounds normal; no masses,  no organomegaly Extremities: extremities normal, atraumatic, no cyanosis or edema Neurologic: Alert and oriented X 3, normal strength and tone. Normal symmetric reflexes. Normal coordination and gait  ECOG PERFORMANCE STATUS: 1 - Symptomatic but completely ambulatory  Blood pressure 153/89, pulse 66, temperature 97.4 F (36.3 C), temperature source Oral, resp. rate 20, height 5\' 2"  (1.575 m), weight 118 lb 4.8 oz (53.661 kg).  LABORATORY DATA: Lab Results  Component Value Date   WBC 4.3 08/30/2012   HGB 13.4 08/30/2012   HCT 39.3 08/30/2012   MCV 96.9 08/30/2012   PLT 202 08/30/2012      Chemistry      Component Value Date/Time   NA 139 08/04/2012 0844   NA 143 07/02/2012 0805   K 4.0 08/04/2012 0844   K 4.0 07/02/2012 0805   CL 101 08/04/2012 0844   CL 102 04/01/2012 0805   CO2 30 08/04/2012 0844   CO2 30* 07/02/2012 0805   BUN 16 08/04/2012 0844   BUN 19.5 07/02/2012 0805   CREATININE 1.00 08/04/2012 0844    CREATININE 0.9 07/02/2012 0805      Component Value Date/Time   CALCIUM 9.8 08/04/2012 0844   CALCIUM 9.7 07/02/2012 0805   ALKPHOS 100 07/02/2012 0805   ALKPHOS 106 03/12/2011 0842   AST 18 07/02/2012 0805   AST 22 03/12/2011 0842   ALT 17 07/02/2012 0805   ALT 12 03/12/2011 0842   BILITOT 0.65 07/02/2012 0805   BILITOT 0.4 03/12/2011 4098  RADIOGRAPHIC STUDIES: Dg Chest 1 View  08/19/2012   *RADIOLOGY REPORT*  Clinical Data: Post right lung biopsy.  CHEST - 1 VIEW  Comparison: 08/06/2012  Findings: Negative for a pneumothorax.  Density at the right lower chest is compatible with the known lesion.  This density is more prominent today's exam and could represent a small amount of post procedure hemorrhage.  Left lung is clear.  Port-A-Cath tip is in the lower SVC.  Heart size is within normal limits.  IMPRESSION: Negative for a pneumothorax following right lung biopsy.   Original Report Authenticated By: Richarda Overlie, M.D.   Dg Chest 2 View  08/04/2012   *RADIOLOGY REPORT*  Clinical Data: Preoperative examination (bronchoscopy with endobronchial ultrasound, history of smoking  CHEST - 2 VIEW  Comparison: 09/27/2011; PET CT - 07/13/2012; chest CT - 07/02/2012  Findings:  Grossly unchanged cardiac silhouette and mediastinal contours with atherosclerotic plaque within the thoracic aorta and imaged abdominal aorta.  Stable positioning of support apparatus.  The lungs appear mildly hyperexpanded.  There is blunting of the right costophrenic angle without definite pleural effusion.  Adjacent nodular opacities overlying the right mid lung correlate with the hypermetabolic right middle lobe nodule seen on recently performed PET CT.  No new focal airspace opacities.  No evidence of edema.  No pneumothorax.  Scoliotic curvature of the lower thoracic / upper lumbar spine, likely degenerative in etiology.  IMPRESSION: 1.  No acute cardiopulmonary disease. 2.  Grossly unchanged appearance of adjacent nodular opacities  which correlate with the hypermetabolic nodules as seen on recently performed PET-CT.   Original Report Authenticated By: Tacey Ruiz, MD   Ct Biopsy  08/19/2012   *RADIOLOGY REPORT*  Clinical history:79 year old with history of renal cell carcinoma and lymphoma.  The patient has a suspicious subcarinal lymph node and right middle lobe lesions.  Previous bronchoscopy biopsy was suspicious for malignancy but additional tissue is needed.  PROCEDURE(S): CT GUIDED BIOPSY OF RIGHT MIDDLE LOBE LESION  Physician: Rachelle Hora. Henn, MD  Medications:Versed 1.5 mg, Fentanyl 75 mcg. A radiology nurse monitored the patient for moderate sedation.  Moderate sedation time:19 minutes  Procedure:The procedure was explained to the patient.  The risks and benefits of the procedure were discussed and the patient's questions were addressed.  Informed consent was obtained from the patient.  The patient was placed on the CT scanner.  Images of the chest were obtained.  The right breast area was prepped and draped in a sterile fashion.  Skin was anesthetized with 1% lidocaine.  A 17 gauge needle was directed towards the anterior right middle lobe lesions with CT guidance.  Needle position was confirmed within the lesion.  Two core biopsies obtained with an 18 gauge device. Needle was removed without complication.  Findings:Adjacent lesions involving the periphery of the anterior right middle lobe.  Needle position was confirmed within the lesion.  Two core biopsies were obtained.  No significant pneumothorax following the procedure.  Complications: None  Impression:Successful CT guided core biopsies of the right middle lobe lesion.   Original Report Authenticated By: Richarda Overlie, M.D.   Dg Chest Port 1 View  08/06/2012   *RADIOLOGY REPORT*  Clinical Data: Status post bronchoscopy.  PORTABLE CHEST - 1 VIEW  Comparison: 08/04/2012  Findings: Lung volumes are low.  No evidence for pneumothorax. The cardiopericardial silhouette is enlarged.   Pulmonary nodule noted at the right lung base and better characterized on recent CT scan from 07/02/2012.  Right Port-A-Cath tip projects in  the region of the SVC / RA junction. Telemetry leads overlie the chest.  IMPRESSION: Low volume film without evidence for pneumothorax.   Original Report Authenticated By: Kennith Center, M.D.   Dg C-arm Bronchoscopy  08/06/2012   CLINICAL DATA: lymphoma   C-ARM BRONCHOSCOPY  Fluoroscopy was utilized by the requesting physician.  No radiographic  interpretation.     ASSESSMENT AND PLAN: This is a very pleasant 79 years old white female with metastatic renal cell carcinoma based on the recent CT guided core biopsy of the right middle lobe lung mass. I have a lengthy discussion with the patient and her son today about her current disease status and treatment options. I discussed with the patient several options for her treatment including consideration of treatment with Votrient/ Sutent. I recommended for the patient Votrient 800 mg by mouth daily. I discussed with the patient adverse effect of this treatment including but not limited to hypertension, cardiac dysfunction, QT prolongation, hemorrhagic event, thromboembolic event.  The patient was given handouts about Votrient and she signed consent to proceed with the treatment. She would come back for followup visit in 2 weeks for reevaluation and management any adverse effect of her treatment. She was advised to call immediately if she has any concerning symptoms in the interval. The patient voices understanding of current disease status and treatment options and is in agreement with the current care plan.  All questions were answered. The patient knows to call the clinic with any problems, questions or concerns. We can certainly see the patient much sooner if necessary.  I spent 15 minutes counseling the patient face to face. The total time spent in the appointment was 25 minutes.

## 2012-08-30 NOTE — Telephone Encounter (Signed)
gv and printed appt sched and avs for pt  °

## 2012-08-30 NOTE — Patient Instructions (Signed)
We discussed treatment with Votrient. Followup visit in 2 weeks

## 2012-09-15 ENCOUNTER — Telehealth: Payer: Self-pay | Admitting: Medical Oncology

## 2012-09-15 NOTE — Telephone Encounter (Signed)
reviewed meds with her and told her that  simvaststin is not listed on pts med record.

## 2012-09-16 ENCOUNTER — Telehealth: Payer: Self-pay | Admitting: Internal Medicine

## 2012-09-16 ENCOUNTER — Other Ambulatory Visit (HOSPITAL_BASED_OUTPATIENT_CLINIC_OR_DEPARTMENT_OTHER): Payer: Medicare Other | Admitting: Lab

## 2012-09-16 ENCOUNTER — Encounter: Payer: Self-pay | Admitting: Physician Assistant

## 2012-09-16 ENCOUNTER — Ambulatory Visit (HOSPITAL_BASED_OUTPATIENT_CLINIC_OR_DEPARTMENT_OTHER): Payer: Medicare Other | Admitting: Physician Assistant

## 2012-09-16 VITALS — BP 184/99 | HR 67 | Temp 97.8°F | Resp 18 | Ht 62.0 in | Wt 115.9 lb

## 2012-09-16 DIAGNOSIS — C649 Malignant neoplasm of unspecified kidney, except renal pelvis: Secondary | ICD-10-CM

## 2012-09-16 DIAGNOSIS — C641 Malignant neoplasm of right kidney, except renal pelvis: Secondary | ICD-10-CM

## 2012-09-16 LAB — CBC WITH DIFFERENTIAL/PLATELET
BASO%: 0.5 % (ref 0.0–2.0)
EOS%: 2.1 % (ref 0.0–7.0)
Eosinophils Absolute: 0.1 10*3/uL (ref 0.0–0.5)
LYMPH%: 17.9 % (ref 14.0–49.7)
MCH: 33 pg (ref 25.1–34.0)
MCHC: 34.4 g/dL (ref 31.5–36.0)
MCV: 95.9 fL (ref 79.5–101.0)
MONO%: 6.8 % (ref 0.0–14.0)
Platelets: 251 10*3/uL (ref 145–400)
RBC: 4.64 10*6/uL (ref 3.70–5.45)
RDW: 14.2 % (ref 11.2–14.5)

## 2012-09-16 LAB — COMPREHENSIVE METABOLIC PANEL (CC13)
AST: 20 U/L (ref 5–34)
Alkaline Phosphatase: 109 U/L (ref 40–150)
Glucose: 95 mg/dl (ref 70–140)
Sodium: 143 mEq/L (ref 136–145)
Total Bilirubin: 0.83 mg/dL (ref 0.20–1.20)
Total Protein: 6.9 g/dL (ref 6.4–8.3)

## 2012-09-16 NOTE — Telephone Encounter (Signed)
Gave pt appt for lab and MD , september 2014

## 2012-09-16 NOTE — Progress Notes (Addendum)
[facility] [phone]   [phone]  SHARED VISIT PROGRESS NOTE  [name], MD [address] [place] [address]  DIAGNOSIS:  #1 non-Hodgkin's lymphoma diagnosed on bone marrow biopsy in June of 2012  #2 metastatic renal cell carcinoma initially diagnosed as stage III (T3a N0M0) renal cell carcinoma diagnosed in June of 2012 with pulmonary metastasis diagnosed in August of 2014.  PRIOR THERAPY:  1) Status post right nephrectomy under the care of Dr. Retta Diones on 09/02/2010.  2) Systemic chemotherapy with CHOP/Rituxan given every 3 weeks, status post 6 cycles.   CURRENT THERAPY: Votrient 800 mg by mouth daily.    INTERVAL HISTORY: Melissa Cross 79 y.o. female returns to the clinic today for followup visit accompanied by her son for her metastatic renal cell carcinoma. The patient is feeling fine today with no specific complaints except for mild fatigue and mild headache. She started taking the Votrient about 5 days ago. She has been paying out of pocket until she receives her supply from the manufacture which should arise tomorrow. Her main issue has been that of poorly controlled blood pressure. She is to see her primary care physician Dr. Mila Palmer tomorrow for further management of her hypertension. Her systolic blood pressures at home have been between the upper 170s to lobe one 80s with diastolic blood pressures in the upper 90s to low 100s. She denied any skin rash or neuropathy symptoms. She denied having any significant weight loss or night sweats. The patient denied having any chest pain, shortness breath, cough or hemoptysis. She did have some questions as to whether she should continue some of the herbal supplements that she is been taking such as wheat grass and ginger.  MEDICAL HISTORY: Past Medical History  Diagnosis Date  . Hypercholesteremia     takes Zocor daily  . Glaucoma   . Anemia   . Stroke     hx of  TIA  . Blood transfusion   . Thrush 12/17/2010  . Renal cell carcinoma   . Non Hodgkin's lymphoma   . Cancer     kidney cancer, NHL   . Cancer     last chemo 11/04/10   . Hypertension     takes Nadolol daily  . Hypothyroidism     takes Synthroid daily  . Hypothyroid   . PONV (postoperative nausea and vomiting)   . Shortness of breath     with exertion  . Headache(784.0)     hx of migraines  . TIA (transient ischemic attack)   . Peripheral neuropathy   . Arthritis     knee /hip  . Scoliosis   . History of bladder infections   . History of blood transfusion   . Cataract     right  . History of shingles     ALLERGIES:  is allergic to crab and dilaudid.  MEDICATIONS:  Current Outpatient Prescriptions  Medication Sig Dispense Refill  . aspirin EC 81 MG tablet Take 81 mg by mouth at bedtime.       . Calcium Citrate (CITRACAL PO) Take 1 tablet by mouth 3 (three) times a week.       . cholecalciferol (VITAMIN D) 400 UNITS TABS Take 400 Units by mouth daily.        . Coenzyme Q10 100 MG TABS Take 1 tablet by mouth daily.      . Garlic 1000 MG CAPS Take 1,000 mg by mouth daily.        [name]  Ginger, Zingiber officinalis, (GINGER ROOT) 550 MG CAPS Take 550 mg by mouth daily.        [name] levothyroxine (SYNTHROID, LEVOTHROID) 50 MCG tablet Take 50 mcg by mouth every morning.       . Multiple Vitamin (MULITIVITAMIN WITH MINERALS) TABS Take 1 tablet by mouth daily.        . nadolol (CORGARD) 40 MG tablet Take 40 mg by mouth at bedtime.       [name] OVER THE COUNTER MEDICATION Take 2,000 mg by mouth daily. Wheat Grass tablets      . pazopanib (VOTRIENT) 200 MG tablet Take 4 tablets (800 mg total) by mouth daily. Take on an empty stomach.  120 tablet  2  . sodium chloride (MURO 128) 5 % ophthalmic solution Place 1 drop into the left eye 3 (three) times daily.      . vitamin B-12 (CYANOCOBALAMIN) 1000 MCG tablet Place 1,000 mcg under the tongue daily.        No current facility-administered  medications for this visit.   Facility-Administered Medications Ordered in Other Visits  Medication Dose Route Frequency Provider Last Rate Last Dose  . sodium chloride 0.9 % injection 10 mL  10 mL Intravenous PRN Si Gaul, MD   10 mL at 05/17/12 0846    SURGICAL HISTORY:  Past Surgical History  Procedure Laterality Date  . Nephrectomy      Right Side  . Portacath placement      right subclavian  . Tonsillectomy      as a child  . Mouth surgery    . Left cataract removed    . Video bronchoscopy with endobronchial ultrasound N/A 08/06/2012    Procedure: VIDEO BRONCHOSCOPY WITH ENDOBRONCHIAL ULTRASOUND;  Surgeon: Leslye Peer, MD;  Location: [facility] OR;  Service: Thoracic;  Laterality: N/A;  . Video bronchoscopy with endobronchial navigation N/A 08/06/2012    Procedure: VIDEO BRONCHOSCOPY WITH ENDOBRONCHIAL NAVIGATION;  Surgeon: Leslye Peer, MD;  Location: [facility] OR;  Service: Thoracic;  Laterality: N/A;    REVIEW OF SYSTEMS:  A comprehensive review of systems was negative except for: Constitutional: positive for fatigue   PHYSICAL EXAMINATION: General appearance: alert, cooperative, fatigued and no distress Head: Normocephalic, without obvious abnormality, atraumatic Neck: no adenopathy Lymph nodes: Cervical, supraclavicular, and axillary nodes normal. Resp: clear to auscultation bilaterally Cardio: regular rate and rhythm, S1, S2 normal, no murmur, click, rub or gallop GI: soft, non-tender; bowel sounds normal; no masses,  no organomegaly Extremities: extremities normal, atraumatic, no cyanosis or edema Neurologic: Alert and oriented X 3, normal strength and tone. Normal symmetric reflexes. Normal coordination and gait  ECOG PERFORMANCE STATUS: 1 - Symptomatic but completely ambulatory  Blood pressure 184/99, pulse 67, temperature 97.8 F (36.6 C), temperature source Oral, resp. rate 18, height 5\' 2"  (1.575 m), weight 115 lb 14.4 oz (52.572 kg), SpO2 100.00%.  LABORATORY  DATA: Lab Results  Component Value Date   WBC 5.0 09/16/2012   HGB 15.3 09/16/2012   HCT 44.5 09/16/2012   MCV 95.9 09/16/2012   PLT 251 09/16/2012      Chemistry      Component Value Date/Time   NA 143 09/16/2012 0848   NA 139 08/04/2012 0844   K 4.1 09/16/2012 0848   K 4.0 08/04/2012 0844   CL 101 08/04/2012 0844   CL 102 04/01/2012 0805   CO2 30* 09/16/2012 0848   CO2 30 08/04/2012 0844   BUN 13.8 09/16/2012 0848   BUN 16 08/04/2012  0844   CREATININE 1.1 09/16/2012 0848   CREATININE 1.00 08/04/2012 0844      Component Value Date/Time   CALCIUM 10.0 09/16/2012 0848   CALCIUM 9.8 08/04/2012 0844   ALKPHOS 109 09/16/2012 0848   ALKPHOS 106 03/12/2011 0842   AST 20 09/16/2012 0848   AST 22 03/12/2011 0842   ALT 16 09/16/2012 0848   ALT 12 03/12/2011 0842   BILITOT 0.83 09/16/2012 0848   BILITOT 0.4 03/12/2011 0842       RADIOGRAPHIC STUDIES: Dg Chest 1 View  08/19/2012   *RADIOLOGY REPORT*  Clinical Data: Post right lung biopsy.  CHEST - 1 VIEW  Comparison: 08/06/2012  Findings: Negative for a pneumothorax.  Density at the right lower chest is compatible with the known lesion.  This density is more prominent today's exam and could represent a small amount of post procedure hemorrhage.  Left lung is clear.  Port-A-Cath tip is in the lower SVC.  Heart size is within normal limits.  IMPRESSION: Negative for a pneumothorax following right lung biopsy.   Original Report Authenticated By: Richarda Overlie, M.D.   Dg Chest 2 View  08/04/2012   *RADIOLOGY REPORT*  Clinical Data: Preoperative examination (bronchoscopy with endobronchial ultrasound, history of smoking  CHEST - 2 VIEW  Comparison: 09/27/2011; PET CT - 07/13/2012; chest CT - 07/02/2012  Findings:  Grossly unchanged cardiac silhouette and mediastinal contours with atherosclerotic plaque within the thoracic aorta and imaged abdominal aorta.  Stable positioning of support apparatus.  The lungs appear mildly hyperexpanded.  There is blunting of the right  costophrenic angle without definite pleural effusion.  Adjacent nodular opacities overlying the right mid lung correlate with the hypermetabolic right middle lobe nodule seen on recently performed PET CT.  No new focal airspace opacities.  No evidence of edema.  No pneumothorax.  Scoliotic curvature of the lower thoracic / upper lumbar spine, likely degenerative in etiology.  IMPRESSION: 1.  No acute cardiopulmonary disease. 2.  Grossly unchanged appearance of adjacent nodular opacities which correlate with the hypermetabolic nodules as seen on recently performed PET-CT.   Original Report Authenticated By: Tacey Ruiz, MD   Ct Biopsy  08/19/2012   *RADIOLOGY REPORT*  Clinical history:79 year old with history of renal cell carcinoma and lymphoma.  The patient has a suspicious subcarinal lymph node and right middle lobe lesions.  Previous bronchoscopy biopsy was suspicious for malignancy but additional tissue is needed.  PROCEDURE(S): CT GUIDED BIOPSY OF RIGHT MIDDLE LOBE LESION  Physician: Rachelle Hora. Henn, MD  Medications:Versed 1.5 mg, Fentanyl 75 mcg. A radiology nurse monitored the patient for moderate sedation.  Moderate sedation time:19 minutes  Procedure:The procedure was explained to the patient.  The risks and benefits of the procedure were discussed and the patient's questions were addressed.  Informed consent was obtained from the patient.  The patient was placed on the CT scanner.  Images of the chest were obtained.  The right breast area was prepped and draped in a sterile fashion.  Skin was anesthetized with 1% lidocaine.  A 17 gauge needle was directed towards the anterior right middle lobe lesions with CT guidance.  Needle position was confirmed within the lesion.  Two core biopsies obtained with an 18 gauge device. Needle was removed without complication.  Findings:Adjacent lesions involving the periphery of the anterior right middle lobe.  Needle position was confirmed within the lesion.  Two core  biopsies were obtained.  No significant pneumothorax following the procedure.  Complications: None  Impression:Successful  CT guided core biopsies of the right middle lobe lesion.   Original Report Authenticated By: Richarda Overlie, M.D.   Dg Chest Port 1 View  08/06/2012   *RADIOLOGY REPORT*  Clinical Data: Status post bronchoscopy.  PORTABLE CHEST - 1 VIEW  Comparison: 08/04/2012  Findings: Lung volumes are low.  No evidence for pneumothorax. The cardiopericardial silhouette is enlarged.  Pulmonary nodule noted at the right lung base and better characterized on recent CT scan from 07/02/2012.  Right Port-A-Cath tip projects in the region of the SVC / RA junction. Telemetry leads overlie the chest.  IMPRESSION: Low volume film without evidence for pneumothorax.   Original Report Authenticated By: Kennith Center, M.D.   Dg C-arm Bronchoscopy  08/06/2012   CLINICAL DATA: lymphoma   C-ARM BRONCHOSCOPY  Fluoroscopy was utilized by the requesting physician.  No radiographic  interpretation.     ASSESSMENT AND PLAN: This is a very pleasant 79 years old white female with metastatic renal cell carcinoma based on the recent CT guided core biopsy of the right middle lobe lung mass. She's currently being treated with Votrient at 800 mg by mouth daily status post approximately one week of therapy. Patient was discussed with an also seen by Dr. Arbutus Ped. She will continue taking Votrient 800 mg by mouth daily. She'll followup in 2 weeks for another symptom management visit. She is encouraged to followup with her primary care physician regarding the improved control of her hypertension. She is advised to discontinue the use of her herbal supplements on a. Both patient and her son voiced understanding. She'll followup in 2 weeks for another symptom management visit with repeat CBC differential and C. met.  Laural Benes, Dyquan Minks E, PA-C   She was advised to call immediately if she has any concerning symptoms in the interval. The  patient voices understanding of current disease status and treatment options and is in agreement with the current care plan.  All questions were answered. The patient knows to call the clinic with any problems, questions or concerns. We can certainly see the patient much sooner if necessary.  ADDENDUM: Hematology/Oncology Attending: I have face to face encounter with the patient today. I recommended her care plan. The patient came to the clinic today accompanied by her son. She is feeling fine with no specific complaints. She is tolerating her treatment with Votrient fairly well so far. She denied having any significant skin rash or diarrhea. Her hypertension is still an issue and she is working with her primary care physician for adjustment of her antihypertensive medication. The patient will continue on treatment with Votrient 800 mg by mouth daily. She would come back for followup visit in 2 weeks for reevaluation and repeat blood work. She was advised to call immediately if she has any concerning symptoms in the interval. Lajuana Matte., MD  09/17/2012

## 2012-09-16 NOTE — Patient Instructions (Addendum)
Continue taking Votrient 800 mg by mouth daily Follow up in weeks for another symptom management visit

## 2012-09-17 DIAGNOSIS — C801 Malignant (primary) neoplasm, unspecified: Secondary | ICD-10-CM | POA: Diagnosis not present

## 2012-09-17 DIAGNOSIS — E039 Hypothyroidism, unspecified: Secondary | ICD-10-CM | POA: Diagnosis not present

## 2012-09-17 DIAGNOSIS — E785 Hyperlipidemia, unspecified: Secondary | ICD-10-CM | POA: Diagnosis not present

## 2012-09-17 DIAGNOSIS — I10 Essential (primary) hypertension: Secondary | ICD-10-CM | POA: Diagnosis not present

## 2012-09-17 DIAGNOSIS — E46 Unspecified protein-calorie malnutrition: Secondary | ICD-10-CM | POA: Diagnosis not present

## 2012-09-30 ENCOUNTER — Other Ambulatory Visit: Payer: Self-pay

## 2012-09-30 ENCOUNTER — Encounter: Payer: Self-pay | Admitting: Physician Assistant

## 2012-09-30 ENCOUNTER — Telehealth: Payer: Self-pay | Admitting: Internal Medicine

## 2012-09-30 ENCOUNTER — Ambulatory Visit (HOSPITAL_BASED_OUTPATIENT_CLINIC_OR_DEPARTMENT_OTHER): Payer: Medicare Other | Admitting: Physician Assistant

## 2012-09-30 ENCOUNTER — Other Ambulatory Visit: Payer: Medicare Other

## 2012-09-30 ENCOUNTER — Other Ambulatory Visit (HOSPITAL_BASED_OUTPATIENT_CLINIC_OR_DEPARTMENT_OTHER): Payer: Medicare Other | Admitting: Lab

## 2012-09-30 VITALS — BP 171/103 | HR 63 | Temp 98.1°F | Resp 20 | Ht 62.0 in | Wt 116.2 lb

## 2012-09-30 DIAGNOSIS — I10 Essential (primary) hypertension: Secondary | ICD-10-CM

## 2012-09-30 DIAGNOSIS — C649 Malignant neoplasm of unspecified kidney, except renal pelvis: Secondary | ICD-10-CM

## 2012-09-30 DIAGNOSIS — Z23 Encounter for immunization: Secondary | ICD-10-CM

## 2012-09-30 DIAGNOSIS — C641 Malignant neoplasm of right kidney, except renal pelvis: Secondary | ICD-10-CM

## 2012-09-30 DIAGNOSIS — C8589 Other specified types of non-Hodgkin lymphoma, extranodal and solid organ sites: Secondary | ICD-10-CM

## 2012-09-30 LAB — CBC WITH DIFFERENTIAL/PLATELET
Eosinophils Absolute: 0.1 10*3/uL (ref 0.0–0.5)
HCT: 44.7 % (ref 34.8–46.6)
LYMPH%: 25.3 % (ref 14.0–49.7)
MCHC: 35 g/dL (ref 31.5–36.0)
MCV: 95.9 fL (ref 79.5–101.0)
MONO#: 0.3 10*3/uL (ref 0.1–0.9)
NEUT#: 2.4 10*3/uL (ref 1.5–6.5)
NEUT%: 63 % (ref 38.4–76.8)
Platelets: 188 10*3/uL (ref 145–400)
WBC: 3.8 10*3/uL — ABNORMAL LOW (ref 3.9–10.3)

## 2012-09-30 LAB — COMPREHENSIVE METABOLIC PANEL (CC13)
CO2: 32 mEq/L — ABNORMAL HIGH (ref 22–29)
Creatinine: 1 mg/dL (ref 0.6–1.1)
Glucose: 90 mg/dl (ref 70–140)
Total Bilirubin: 0.96 mg/dL (ref 0.20–1.20)

## 2012-09-30 MED ORDER — INFLUENZA VAC SPLIT QUAD 0.5 ML IM SUSP: 0.5000 mL | INTRAMUSCULAR | Status: DC

## 2012-09-30 MED ORDER — INFLUENZA VAC SPLIT QUAD 0.5 ML IM SUSP
0.5000 mL | Freq: Once | INTRAMUSCULAR | Status: AC
  Administered 2012-09-30: 0.5 mL via INTRAMUSCULAR
  Filled 2012-09-30: qty 0.5

## 2012-09-30 MED ORDER — AMLODIPINE BESYLATE 5 MG PO TABS: 5.0000 mg | ORAL_TABLET | Freq: Every day | ORAL | Status: AC

## 2012-09-30 NOTE — Telephone Encounter (Signed)
Gave pt appt for lab , flush and MD on October 2015 °

## 2012-09-30 NOTE — Progress Notes (Signed)
[facility] [phone]   [phone]  OFFICE PROGRESS NOTE  Melissa Reeve, MD [address] [place] [address]  DIAGNOSIS:  #1 non-Hodgkin's lymphoma diagnosed on bone marrow biopsy in June of 2012  #2 metastatic renal cell carcinoma initially diagnosed as stage III (T3a N0M0) renal cell carcinoma diagnosed in June of 2012 with pulmonary metastasis diagnosed in August of 2014.  PRIOR THERAPY:  1) Status post right nephrectomy under the care of Dr. Retta Diones on 09/02/2010.  2) Systemic chemotherapy with CHOP/Rituxan given every 3 weeks, status post 6 cycles.   CURRENT THERAPY: Votrient 800 mg by mouth daily, status post approximately 2-1/2 weeks of therapy.    INTERVAL HISTORY: Melissa Cross 79 y.o. female returns to the clinic today for followup visit accompanied by her son for her metastatic renal cell carcinoma. The patient is feeling fine today with no specific complaints except for mild fatigue and mild headache. Since being on the low treatment she has noticed that her hypertension has been more poorly controlled. Her primary care physician, Melissa Cross, increased her losartan to 50 mg by mouth daily. This was done approximately 3 days ago. 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  She denied any skin rash or neuropathy symptoms. She denied having any significant weight loss or night sweats. The patient denied having any discrete chest pain, shortness breath, cough or hemoptysis. She is unsure when her last EKG was. She requests a flu vaccine today. She will also be due for Port-A-Cath flush within the next one to 3 weeks.  MEDICAL HISTORY: Past Medical History  Diagnosis Date  . Hypercholesteremia     takes Zocor daily  . Glaucoma   . Anemia   . Stroke     hx of TIA  . Blood transfusion   . Thrush  12/17/2010  . Renal cell carcinoma   . Non Hodgkin's lymphoma   . Cancer     kidney cancer, NHL   . Cancer     last chemo 11/04/10   . Hypertension     takes Nadolol daily  . Hypothyroidism     takes Synthroid daily  . Hypothyroid   . PONV (postoperative nausea and vomiting)   . Shortness of breath     with exertion  . Headache(784.0)     hx of migraines  . TIA (transient ischemic attack)   . Peripheral neuropathy   . Arthritis     knee /hip  . Scoliosis   . History of bladder infections   . History of blood transfusion   . Cataract     right  . History of shingles     ALLERGIES:  is allergic to crab and dilaudid.  MEDICATIONS:  Current Outpatient Prescriptions  Medication Sig Dispense Refill  . amLODipine (NORVASC) 5 MG tablet Take 1 tablet (5 mg total) by mouth daily.  30 tablet  0  . aspirin EC 81 MG tablet Take 81 mg by mouth at bedtime.       . Calcium Citrate (CITRACAL PO) Take 1 tablet by mouth 3 (three) times a week.       . cholecalciferol (VITAMIN D) 400 UNITS TABS Take 400 Units by mouth daily.        [name] levothyroxine (SYNTHROID, LEVOTHROID) 50 MCG tablet Take 50 mcg by mouth  every morning.       [name] losartan (COZAAR) 25 MG tablet Take 50 mg by mouth daily.       . Multiple Vitamin (MULITIVITAMIN WITH MINERALS) TABS Take 1 tablet by mouth daily.        . nadolol (CORGARD) 40 MG tablet Take 40 mg by mouth at bedtime.       . pazopanib (VOTRIENT) 200 MG tablet Take 4 tablets (800 mg total) by mouth daily. Take on an empty stomach.  120 tablet  2  . sodium chloride (MURO 128) 5 % ophthalmic solution Place 1 drop into the left eye 3 (three) times daily.      . vitamin B-12 (CYANOCOBALAMIN) 1000 MCG tablet Place 1,000 mcg under the tongue daily.        No current facility-administered medications for this visit.   Facility-Administered Medications Ordered in Other Visits  Medication Dose Route Frequency Provider Last Rate Last Dose  . sodium chloride 0.9 %  injection 10 mL  10 mL Intravenous PRN Si Gaul, MD   10 mL at 05/17/12 0846    SURGICAL HISTORY:  Past Surgical History  Procedure Laterality Date  . Nephrectomy      Right Side  . Portacath placement      right subclavian  . Tonsillectomy      as a child  . Mouth surgery    . Left cataract removed    . Video bronchoscopy with endobronchial ultrasound N/A 08/06/2012    Procedure: VIDEO BRONCHOSCOPY WITH ENDOBRONCHIAL ULTRASOUND;  Surgeon: Leslye Peer, MD;  Location: [facility] OR;  Service: Thoracic;  Laterality: N/A;  . Video bronchoscopy with endobronchial navigation N/A 08/06/2012    Procedure: VIDEO BRONCHOSCOPY WITH ENDOBRONCHIAL NAVIGATION;  Surgeon: Leslye Peer, MD;  Location: [facility] OR;  Service: Thoracic;  Laterality: N/A;    REVIEW OF SYSTEMS:  A comprehensive review of systems was negative except for: Constitutional: positive for fatigue Neurological: positive for headaches   PHYSICAL EXAMINATION: General appearance: alert, cooperative, fatigued and no distress Head: Normocephalic, without obvious abnormality, atraumatic Neck: no adenopathy Lymph nodes: Cervical, supraclavicular, and axillary nodes normal. Resp: clear to auscultation bilaterally Cardio: regular rate and rhythm, S1, S2 normal, no murmur, click, rub or gallop GI: soft, non-tender; bowel sounds normal; no masses,  no organomegaly Extremities: extremities normal, atraumatic, no cyanosis or edema Neurologic: Alert and oriented X 3, normal strength and tone. Normal symmetric reflexes. Normal coordination and gait  ECOG PERFORMANCE STATUS: 1 - Symptomatic but completely ambulatory  Blood pressure 171/103, pulse 63, temperature 98.1 F (36.7 C), temperature source Oral, resp. rate 20, height 5\' 2"  (1.575 m), weight 116 lb 3.2 oz (52.708 kg).  LABORATORY DATA: Lab Results  Component Value Date   WBC 3.8* 09/30/2012   HGB 15.6 09/30/2012   HCT 44.7 09/30/2012   MCV 95.9 09/30/2012   PLT 188 09/30/2012       Chemistry      Component Value Date/Time   NA 143 09/30/2012 0848   NA 139 08/04/2012 0844   K 4.1 09/30/2012 0848   K 4.0 08/04/2012 0844   CL 101 08/04/2012 0844   CL 102 04/01/2012 0805   CO2 32* 09/30/2012 0848   CO2 30 08/04/2012 0844   BUN 14.8 09/30/2012 0848   BUN 16 08/04/2012 0844   CREATININE 1.0 09/30/2012 0848   CREATININE 1.00 08/04/2012 0844      Component Value Date/Time   CALCIUM 9.9 09/30/2012 0848   CALCIUM 9.8 08/04/2012  0844   ALKPHOS 119 09/30/2012 0848   ALKPHOS 106 03/12/2011 0842   AST 21 09/30/2012 0848   AST 22 03/12/2011 0842   ALT 18 09/30/2012 0848   ALT 12 03/12/2011 0842   BILITOT 0.96 09/30/2012 0848   BILITOT 0.4 03/12/2011 0842     EKG performed today revealed a ventricular rate of 61 beats per minute deemed normal sinus rhythm, there is left axis deviation with left ventricular hypertrophy with repolarization abnormalities.  RADIOGRAPHIC STUDIES: Dg Chest 1 View  08/19/2012   *RADIOLOGY REPORT*  Clinical Data: Post right lung biopsy.  CHEST - 1 VIEW  Comparison: 08/06/2012  Findings: Negative for a pneumothorax.  Density at the right lower chest is compatible with the known lesion.  This density is more prominent today's exam and could represent a small amount of post procedure hemorrhage.  Left lung is clear.  Port-A-Cath tip is in the lower SVC.  Heart size is within normal limits.  IMPRESSION: Negative for a pneumothorax following right lung biopsy.   Original Report Authenticated By: Richarda Overlie, M.D.   Dg Chest 2 View  08/04/2012   *RADIOLOGY REPORT*  Clinical Data: Preoperative examination (bronchoscopy with endobronchial ultrasound, history of smoking  CHEST - 2 VIEW  Comparison: 09/27/2011; PET CT - 07/13/2012; chest CT - 07/02/2012  Findings:  Grossly unchanged cardiac silhouette and mediastinal contours with atherosclerotic plaque within the thoracic aorta and imaged abdominal aorta.  Stable positioning of support apparatus.  The lungs appear mildly  hyperexpanded.  There is blunting of the right costophrenic angle without definite pleural effusion.  Adjacent nodular opacities overlying the right mid lung correlate with the hypermetabolic right middle lobe nodule seen on recently performed PET CT.  No new focal airspace opacities.  No evidence of edema.  No pneumothorax.  Scoliotic curvature of the lower thoracic / upper lumbar spine, likely degenerative in etiology.  IMPRESSION: 1.  No acute cardiopulmonary disease. 2.  Grossly unchanged appearance of adjacent nodular opacities which correlate with the hypermetabolic nodules as seen on recently performed PET-CT.   Original Report Authenticated By: Tacey Ruiz, MD   Ct Biopsy  08/19/2012   *RADIOLOGY REPORT*  Clinical history:79 year old with history of renal cell carcinoma and lymphoma.  The patient has a suspicious subcarinal lymph node and right middle lobe lesions.  Previous bronchoscopy biopsy was suspicious for malignancy but additional tissue is needed.  PROCEDURE(S): CT GUIDED BIOPSY OF RIGHT MIDDLE LOBE LESION  Physician: Rachelle Hora. Henn, MD  Medications:Versed 1.5 mg, Fentanyl 75 mcg. A radiology nurse monitored the patient for moderate sedation.  Moderate sedation time:19 minutes  Procedure:The procedure was explained to the patient.  The risks and benefits of the procedure were discussed and the patient's questions were addressed.  Informed consent was obtained from the patient.  The patient was placed on the CT scanner.  Images of the chest were obtained.  The right breast area was prepped and draped in a sterile fashion.  Skin was anesthetized with 1% lidocaine.  A 17 gauge needle was directed towards the anterior right middle lobe lesions with CT guidance.  Needle position was confirmed within the lesion.  Two core biopsies obtained with an 18 gauge device. Needle was removed without complication.  Findings:Adjacent lesions involving the periphery of the anterior right middle lobe.  Needle  position was confirmed within the lesion.  Two core biopsies were obtained.  No significant pneumothorax following the procedure.  Complications: None  Impression:Successful CT guided core biopsies of  the right middle lobe lesion.   Original Report Authenticated By: Richarda Overlie, M.D.   Dg Chest Port 1 View  08/06/2012   *RADIOLOGY REPORT*  Clinical Data: Status post bronchoscopy.  PORTABLE CHEST - 1 VIEW  Comparison: 08/04/2012  Findings: Lung volumes are low.  No evidence for pneumothorax. The cardiopericardial silhouette is enlarged.  Pulmonary nodule noted at the right lung base and better characterized on recent CT scan from 07/02/2012.  Right Port-A-Cath tip projects in the region of the SVC / RA junction. Telemetry leads overlie the chest.  IMPRESSION: Low volume film without evidence for pneumothorax.   Original Report Authenticated By: Kennith Center, M.D.   Dg C-arm Bronchoscopy  08/06/2012   CLINICAL DATA: lymphoma   C-ARM BRONCHOSCOPY  Fluoroscopy was utilized by the requesting physician.  No radiographic  interpretation.     ASSESSMENT AND PLAN: This is a very pleasant 79 years old white female with metastatic renal cell carcinoma based on the recent CT guided core biopsy of the right middle lobe lung mass. She's currently being treated with Votrient at 800 mg by mouth daily status post approximately one week of therapy. Patient was discussed with  Dr. Truett Perna. He recommended that she continue taking Votrient 800 mg by mouth daily, with no dose reduction. We will add Norvasc 5 mg by mouth daily to her current antihypertensive medications. Should her blood pressures continue to run high despite these measures she is advised to either see her primary care physician urgently or present to the nearest emergency room for further evaluation and management of her poorly controlled hypertension. The Votrient treatment can cause hypertension and we will need to monitor this closely as well. She'll follow up  with Dr. Arbutus Ped in 2 weeks for another symptom management visit. She is encouraged to followup with her primary care physician regarding the improved control of her hypertension. Her persistent poorly controlled hypertension is concerning given that she is status post right nephrectomy and only has one kidney. Patient was given a flu vaccine today. When she returns in 2 weeks we'll arrange to have her Port-A-Cath flushed and obtain her labs from the port a cath.   Laural Benes, Malikhi Ogan E, PA-C   She was advised to call immediately if she has any concerning symptoms in the interval. The patient voices understanding of current disease status and treatment options and is in agreement with the current care plan.  All questions were answered. The patient knows to call the clinic with any problems, questions or concerns. We can certainly see the patient much sooner if necessary.

## 2012-10-02 NOTE — Patient Instructions (Signed)
Take Norvasc 5 mg by mouth daily in addition to your current medications for your high blood pressure. Continue taking Votrient 800 mg by mouth daily Follow up in 2 weeks If your blood pressure continues to remain high, see your primary care physician urgently or present to the nearest emergency room for further evaluation and management

## 2012-10-18 ENCOUNTER — Other Ambulatory Visit (HOSPITAL_BASED_OUTPATIENT_CLINIC_OR_DEPARTMENT_OTHER): Payer: Medicare Other | Admitting: Lab

## 2012-10-18 ENCOUNTER — Ambulatory Visit: Payer: Medicare Other | Admitting: Lab

## 2012-10-18 ENCOUNTER — Telehealth: Payer: Self-pay | Admitting: Internal Medicine

## 2012-10-18 ENCOUNTER — Ambulatory Visit (HOSPITAL_BASED_OUTPATIENT_CLINIC_OR_DEPARTMENT_OTHER): Payer: Medicare Other

## 2012-10-18 ENCOUNTER — Encounter: Payer: Self-pay | Admitting: Physician Assistant

## 2012-10-18 ENCOUNTER — Ambulatory Visit (HOSPITAL_BASED_OUTPATIENT_CLINIC_OR_DEPARTMENT_OTHER): Payer: Medicare Other | Admitting: Physician Assistant

## 2012-10-18 VITALS — BP 155/85 | HR 63 | Temp 97.2°F | Resp 18

## 2012-10-18 VITALS — BP 157/84 | HR 108 | Temp 98.4°F | Resp 20 | Ht 62.0 in | Wt 115.8 lb

## 2012-10-18 DIAGNOSIS — C8589 Other specified types of non-Hodgkin lymphoma, extranodal and solid organ sites: Secondary | ICD-10-CM | POA: Diagnosis not present

## 2012-10-18 DIAGNOSIS — C649 Malignant neoplasm of unspecified kidney, except renal pelvis: Secondary | ICD-10-CM

## 2012-10-18 DIAGNOSIS — C7800 Secondary malignant neoplasm of unspecified lung: Secondary | ICD-10-CM | POA: Diagnosis not present

## 2012-10-18 DIAGNOSIS — I10 Essential (primary) hypertension: Secondary | ICD-10-CM

## 2012-10-18 DIAGNOSIS — C641 Malignant neoplasm of right kidney, except renal pelvis: Secondary | ICD-10-CM

## 2012-10-18 DIAGNOSIS — C8590 Non-Hodgkin lymphoma, unspecified, unspecified site: Secondary | ICD-10-CM

## 2012-10-18 LAB — CBC WITH DIFFERENTIAL/PLATELET
Eosinophils Absolute: 0.1 10*3/uL (ref 0.0–0.5)
HCT: 44 % (ref 34.8–46.6)
HGB: 15.4 g/dL (ref 11.6–15.9)
LYMPH%: 14.3 % (ref 14.0–49.7)
MONO#: 0.3 10*3/uL (ref 0.1–0.9)
NEUT#: 4.1 10*3/uL (ref 1.5–6.5)
NEUT%: 76.9 % — ABNORMAL HIGH (ref 38.4–76.8)
Platelets: 204 10*3/uL (ref 145–400)
WBC: 5.3 10*3/uL (ref 3.9–10.3)

## 2012-10-18 LAB — COMPREHENSIVE METABOLIC PANEL (CC13)
Anion Gap: 10 mEq/L (ref 3–11)
CO2: 29 mEq/L (ref 22–29)
Creatinine: 0.9 mg/dL (ref 0.6–1.1)
Glucose: 90 mg/dl (ref 70–140)
Sodium: 142 mEq/L (ref 136–145)
Total Bilirubin: 0.84 mg/dL (ref 0.20–1.20)
Total Protein: 7.2 g/dL (ref 6.4–8.3)

## 2012-10-18 LAB — LACTATE DEHYDROGENASE (CC13): LDH: 247 U/L — ABNORMAL HIGH (ref 125–245)

## 2012-10-18 MED ORDER — HEPARIN SOD (PORK) LOCK FLUSH 100 UNIT/ML IV SOLN
500.0000 [IU] | Freq: Once | INTRAVENOUS | Status: AC
  Administered 2012-10-18: 500 [IU] via INTRAVENOUS
  Filled 2012-10-18: qty 5

## 2012-10-18 MED ORDER — SODIUM CHLORIDE 0.9 % IJ SOLN
10.0000 mL | INTRAMUSCULAR | Status: DC | PRN
  Administered 2012-10-18: 10 mL via INTRAVENOUS
  Filled 2012-10-18: qty 10

## 2012-10-18 NOTE — Telephone Encounter (Signed)
lmonvm for pt re lb/flush/fu 11/11 @ 9:30am. [facility] will call w/ct and pt made aware prior to leaving today. Schedule mailed.

## 2012-10-18 NOTE — Patient Instructions (Signed)
Check back with your primary care physician regarding your high blood pressure medications. Consider having the dose of your Norvasc increased as opposed to adding a diuretic Followup with Dr. Arbutus Ped in one month with a restaging CT scan of your chest, abdomen and pelvis to reevaluate her disease

## 2012-10-18 NOTE — Progress Notes (Addendum)
[facility] [phone]   [phone]  SHARED VISIT PROGRESS NOTE  Melissa Reeve, MD [address] [place] [address]  DIAGNOSIS:  #1 non-Hodgkin's lymphoma diagnosed on bone marrow biopsy in June of 2012  #2 metastatic renal cell carcinoma initially diagnosed as stage III (T3a N0M0) renal cell carcinoma diagnosed in June of 2012 with pulmonary metastasis diagnosed in August of 2014.  PRIOR THERAPY:  1) Status post right nephrectomy under the care of Dr. Retta Diones on 09/02/2010.  2) Systemic chemotherapy with CHOP/Rituxan given every 3 weeks, status post 6 cycles.   CURRENT THERAPY: Votrient 800 mg by mouth daily, status post approximately 1 month of therapy.    INTERVAL HISTORY: Melissa Cross 79 y.o. female returns to the clinic today for followup visit accompanied by her son for her metastatic renal cell carcinoma. The patient is feeling fine today with no specific complaints except for mild fatigue and mild headache. Since being on the Votrient treatment she has noticed that her hypertension has been more poorly controlled. Her primary care physician, Dr. Paulino Rily, increased her losartan to 50 mg by mouth daily. We added Norvasc 5 mg by mouth daily at her last office visit. Blood pressures at home have been in the 150's/mid 80's. This is an improvement from when her blood pressure was runnung systolic between 811  And 213 and diastolic between 99 and 107.  She states that Dr. Laurine Blazer made of further change to her blood pressure medication by switching from will start in 2 losartan +12.5 mg of HCTZ. Patient was reluctant to start this medication without a reviewing it with Dr. Arbutus Ped. It should be noted the patient is status post right nephrectomy on 09/02/2010 therefore having only the left kidney remaining. She denied any skin rash or neuropathy symptoms related to the Votrient therapy. She denied having any significant weight  loss or night sweats. The patient denied having any discrete chest pain, shortness breath, cough or hemoptysis. She was due for a Port-A-Cath flush today however the nurses were unable to give blood return.   MEDICAL HISTORY: Past Medical History  Diagnosis Date  . Hypercholesteremia     takes Zocor daily  . Glaucoma   . Anemia   . Stroke     hx of TIA  . Blood transfusion   . Thrush 12/17/2010  . Renal cell carcinoma   . Non Hodgkin's lymphoma   . Cancer     kidney cancer, NHL   . Cancer     last chemo 11/04/10   . Hypertension     takes Nadolol daily  . Hypothyroidism     takes Synthroid daily  . Hypothyroid   . PONV (postoperative nausea and vomiting)   . Shortness of breath     with exertion  . Headache(784.0)     hx of migraines  . TIA (transient ischemic attack)   . Peripheral neuropathy   . Arthritis     knee /hip  . Scoliosis   . History of bladder infections   . History of blood transfusion   . Cataract     right  . History of shingles     ALLERGIES:  is allergic to crab and dilaudid.  MEDICATIONS:  Current Outpatient Prescriptions  Medication Sig Dispense Refill  . amLODipine (NORVASC) 5 MG tablet Take 1 tablet (5 mg total) by mouth daily.  30 tablet  0  . aspirin EC 81 MG tablet  Take 81 mg by mouth at bedtime.       . Calcium Citrate (CITRACAL PO) Take 1 tablet by mouth 3 (three) times a week.       . cholecalciferol (VITAMIN D) 400 UNITS TABS Take 400 Units by mouth daily.        [name] levothyroxine (SYNTHROID, LEVOTHROID) 50 MCG tablet Take 50 mcg by mouth every morning.       [name] losartan (COZAAR) 25 MG tablet Take 50 mg by mouth daily.       . Multiple Vitamin (MULITIVITAMIN WITH MINERALS) TABS Take 1 tablet by mouth daily.        . nadolol (CORGARD) 40 MG tablet Take 40 mg by mouth at bedtime.       . pazopanib (VOTRIENT) 200 MG tablet Take 4 tablets (800 mg total) by mouth daily. Take on an empty stomach.  120 tablet  2  . sodium chloride (MURO 128) 5  % ophthalmic solution Place 1 drop into the left eye 3 (three) times daily.      . vitamin B-12 (CYANOCOBALAMIN) 1000 MCG tablet Place 1,000 mcg under the tongue daily.        No current facility-administered medications for this visit.   Facility-Administered Medications Ordered in Other Visits  Medication Dose Route Frequency Provider Last Rate Last Dose  . sodium chloride 0.9 % injection 10 mL  10 mL Intravenous PRN Si Gaul, MD   10 mL at 05/17/12 0846    SURGICAL HISTORY:  Past Surgical History  Procedure Laterality Date  . Nephrectomy      Right Side  . Portacath placement      right subclavian  . Tonsillectomy      as a child  . Mouth surgery    . Left cataract removed    . Video bronchoscopy with endobronchial ultrasound N/A 08/06/2012    Procedure: VIDEO BRONCHOSCOPY WITH ENDOBRONCHIAL ULTRASOUND;  Surgeon: Leslye Peer, MD;  Location: [facility] OR;  Service: Thoracic;  Laterality: N/A;  . Video bronchoscopy with endobronchial navigation N/A 08/06/2012    Procedure: VIDEO BRONCHOSCOPY WITH ENDOBRONCHIAL NAVIGATION;  Surgeon: Leslye Peer, MD;  Location: [facility] OR;  Service: Thoracic;  Laterality: N/A;    REVIEW OF SYSTEMS:  A comprehensive review of systems was negative except for: Constitutional: positive for fatigue Neurological: positive for headaches   PHYSICAL EXAMINATION: General appearance: alert, cooperative, fatigued and no distress Head: Normocephalic, without obvious abnormality, atraumatic Neck: no adenopathy Lymph nodes: Cervical, supraclavicular, and axillary nodes normal. Resp: clear to auscultation bilaterally Cardio: regular rate and rhythm, S1, S2 normal, no murmur, click, rub or gallop GI: soft, non-tender; bowel sounds normal; no masses,  no organomegaly Extremities: extremities normal, atraumatic, no cyanosis or edema Neurologic: Alert and oriented X 3, normal strength and tone. Normal symmetric reflexes. Normal coordination and gait  ECOG PERFORMANCE  STATUS: 1 - Symptomatic but completely ambulatory  Blood pressure 157/84, pulse 108, temperature 98.4 F (36.9 C), temperature source Oral, resp. rate 20, height 5\' 2"  (1.575 m), weight 115 lb 12.8 oz (52.527 kg).  LABORATORY DATA: Lab Results  Component Value Date   WBC 5.3 10/18/2012   HGB 15.4 10/18/2012   HCT 44.0 10/18/2012   MCV 96.5 10/18/2012   PLT 204 10/18/2012      Chemistry      Component Value Date/Time   NA 142 10/18/2012 0839   NA 139 08/04/2012 0844   K 4.1 10/18/2012 0839   K 4.0 08/04/2012 0844  CL 101 08/04/2012 0844   CL 102 04/01/2012 0805   CO2 29 10/18/2012 0839   CO2 30 08/04/2012 0844   BUN 13.2 10/18/2012 0839   BUN 16 08/04/2012 0844   CREATININE 0.9 10/18/2012 0839   CREATININE 1.00 08/04/2012 0844      Component Value Date/Time   CALCIUM 9.8 10/18/2012 0839   CALCIUM 9.8 08/04/2012 0844   ALKPHOS 124 10/18/2012 0839   ALKPHOS 106 03/12/2011 0842   AST 22 10/18/2012 0839   AST 22 03/12/2011 0842   ALT 17 10/18/2012 0839   ALT 12 03/12/2011 0842   BILITOT 0.84 10/18/2012 0839   BILITOT 0.4 03/12/2011 0842       RADIOGRAPHIC STUDIES: Dg Chest 1 View  08/19/2012   *RADIOLOGY REPORT*  Clinical Data: Post right lung biopsy.  CHEST - 1 VIEW  Comparison: 08/06/2012  Findings: Negative for a pneumothorax.  Density at the right lower chest is compatible with the known lesion.  This density is more prominent today's exam and could represent a small amount of post procedure hemorrhage.  Left lung is clear.  Port-A-Cath tip is in the lower SVC.  Heart size is within normal limits.  IMPRESSION: Negative for a pneumothorax following right lung biopsy.   Original Report Authenticated By: Richarda Overlie, M.D.   Dg Chest 2 View  08/04/2012   *RADIOLOGY REPORT*  Clinical Data: Preoperative examination (bronchoscopy with endobronchial ultrasound, history of smoking  CHEST - 2 VIEW  Comparison: 09/27/2011; PET CT - 07/13/2012; chest CT - 07/02/2012  Findings:  Grossly unchanged  cardiac silhouette and mediastinal contours with atherosclerotic plaque within the thoracic aorta and imaged abdominal aorta.  Stable positioning of support apparatus.  The lungs appear mildly hyperexpanded.  There is blunting of the right costophrenic angle without definite pleural effusion.  Adjacent nodular opacities overlying the right mid lung correlate with the hypermetabolic right middle lobe nodule seen on recently performed PET CT.  No new focal airspace opacities.  No evidence of edema.  No pneumothorax.  Scoliotic curvature of the lower thoracic / upper lumbar spine, likely degenerative in etiology.  IMPRESSION: 1.  No acute cardiopulmonary disease. 2.  Grossly unchanged appearance of adjacent nodular opacities which correlate with the hypermetabolic nodules as seen on recently performed PET-CT.   Original Report Authenticated By: Tacey Ruiz, MD   Ct Biopsy  08/19/2012   *RADIOLOGY REPORT*  Clinical history:79 year old with history of renal cell carcinoma and lymphoma.  The patient has a suspicious subcarinal lymph node and right middle lobe lesions.  Previous bronchoscopy biopsy was suspicious for malignancy but additional tissue is needed.  PROCEDURE(S): CT GUIDED BIOPSY OF RIGHT MIDDLE LOBE LESION  Physician: Rachelle Hora. Henn, MD  Medications:Versed 1.5 mg, Fentanyl 75 mcg. A radiology nurse monitored the patient for moderate sedation.  Moderate sedation time:19 minutes  Procedure:The procedure was explained to the patient.  The risks and benefits of the procedure were discussed and the patient's questions were addressed.  Informed consent was obtained from the patient.  The patient was placed on the CT scanner.  Images of the chest were obtained.  The right breast area was prepped and draped in a sterile fashion.  Skin was anesthetized with 1% lidocaine.  A 17 gauge needle was directed towards the anterior right middle lobe lesions with CT guidance.  Needle position was confirmed within the lesion.   Two core biopsies obtained with an 18 gauge device. Needle was removed without complication.  Findings:Adjacent lesions involving the  periphery of the anterior right middle lobe.  Needle position was confirmed within the lesion.  Two core biopsies were obtained.  No significant pneumothorax following the procedure.  Complications: None  Impression:Successful CT guided core biopsies of the right middle lobe lesion.   Original Report Authenticated By: Richarda Overlie, M.D.   Dg Chest Port 1 View  08/06/2012   *RADIOLOGY REPORT*  Clinical Data: Status post bronchoscopy.  PORTABLE CHEST - 1 VIEW  Comparison: 08/04/2012  Findings: Lung volumes are low.  No evidence for pneumothorax. The cardiopericardial silhouette is enlarged.  Pulmonary nodule noted at the right lung base and better characterized on recent CT scan from 07/02/2012.  Right Port-A-Cath tip projects in the region of the SVC / RA junction. Telemetry leads overlie the chest.  IMPRESSION: Low volume film without evidence for pneumothorax.   Original Report Authenticated By: Kennith Center, M.D.   Dg C-arm Bronchoscopy  08/06/2012   CLINICAL DATA: lymphoma   C-ARM BRONCHOSCOPY  Fluoroscopy was utilized by the requesting physician.  No radiographic  interpretation.     ASSESSMENT AND PLAN: This is a very pleasant 79 years old white female with metastatic renal cell carcinoma based on the CT guided core biopsy of the right middle lobe lung mass. She's currently being treated with Votrient at 800 mg by mouth daily status post approximately one month of therapy. Patient was discussed with Thurston Hole seen by Dr. Arbutus Ped.[name] He recommended that she continue taking Votrient 800 mg by mouth daily, with no dose reduction. Regarding her blood pressure control, Dr. Arbutus Ped is concerned about adding a diuretic as Ms. Witting has only one kidney and is at greater risk for dehydration and therefore further damage to the existing kidney. We would recommend instead that her Norvasc dose  be increased to obtain tighter control of her blood pressure rather than adding even a small dose of diuretic. The patient and her son will contact Dr. Laurine Blazer and discuss her antihypertensive medications further. She will continue on Votrientat 800 mg by mouth daily and followup with Dr. Arbutus Ped in one month with a restaging CT scan of the chest, abdomen and pelvis without contrast in deference to her one kidney. When she returns in one month we will again schedule her for Port-A-Cath flush and have her labs drawn from her port. Should there still be an issue with lack of blood return, she will receive TPA to the Port-A-Cath. Of note the patient's son, [name] is starting a new position and will only be available to bring his mother on Tuesdays. Her appointments moving forward to accommodate her son's schedule will be on Tuesdays.  Laural Benes, Marshun Duva E, PA-C   She was advised to call immediately if she has any concerning symptoms in the interval. The patient voices understanding of current disease status and treatment options and is in agreement with the current care plan.  All questions were answered. The patient knows to call the clinic with any problems, questions or concerns. We can certainly see the patient much sooner if necessary.  ADDENDUM: Hematology/Oncology Attending: I had a face to face encounter with the patient today. I recommended her care plan. This is a very pleasant 79 years old white female with metastatic renal cell carcinoma currently on treatment with Votrient 800 mg by mouth twice a day started one month ago and tolerating her treatment fairly well. The patient has no specific complaints today except for uncontrolled hypertension and her blood pressure medication were changed recently by her primary care  physician including the addition of diuretic. The patient is status post right nephrectomy and I recommended for her to discuss with her primary care physician adjusting her blood  pressure medications and probably excluding the diuretics from her treatment because of her renal insufficiency. She would come back for follow up visit in one month with repeat CT scan of the chest, abdomen and pelvis without contrast for restaging of her disease. The patient was advised to call immediately if she has any concerning symptoms in the interval. Lajuana Matte., MD 10/18/2012

## 2012-10-27 ENCOUNTER — Other Ambulatory Visit: Payer: Self-pay | Admitting: *Deleted

## 2012-10-27 NOTE — Telephone Encounter (Signed)
Received refill request from [facility] pharmacy for Nystatin from 01/14/2011.  Spoke with pt and was informed that pt does not need this med at this time.  Pt stated some tenderness on tongue at times but did not interfere with eating or drinking.  Pt also stated she has changed to [employer].  Instructed pt to call office if any thrush symptoms or redness of tongue worsened.  Pt voiced understanding. [facility] and spoke with a pharmacist.  Informed pharmacist of above info.

## 2012-10-28 DIAGNOSIS — I10 Essential (primary) hypertension: Secondary | ICD-10-CM | POA: Diagnosis not present

## 2012-11-05 ENCOUNTER — Telehealth: Payer: Self-pay | Admitting: Medical Oncology

## 2012-11-05 NOTE — Telephone Encounter (Signed)
Pt.notified

## 2012-11-05 NOTE — Telephone Encounter (Signed)
Message copied by Charma Igo on Fri Nov 05, 2012  9:52 AM ------      Message from: Si Gaul      Created: Thu Nov 04, 2012  6:14 PM       She still can take it after contrast      ----- Message -----         From: Charma Igo, RN         Sent: 11/04/2012   4:46 PM           To: Si Gaul, MD            Has to drink contrast next week for ct . She is on votrient ( takes on empty stomach).      Should she skip dose that day?       ------

## 2012-11-09 ENCOUNTER — Encounter (HOSPITAL_COMMUNITY): Payer: Self-pay

## 2012-11-09 ENCOUNTER — Ambulatory Visit (HOSPITAL_COMMUNITY)
Admission: RE | Admit: 2012-11-09 | Discharge: 2012-11-09 | Disposition: A | Discharge status: Home / Self Care | Payer: Medicare Other | Source: Ambulatory Visit | Attending: Physician Assistant | Admitting: Physician Assistant

## 2012-11-09 DIAGNOSIS — C8589 Other specified types of non-Hodgkin lymphoma, extranodal and solid organ sites: Secondary | ICD-10-CM | POA: Diagnosis not present

## 2012-11-09 DIAGNOSIS — C641 Malignant neoplasm of right kidney, except renal pelvis: Secondary | ICD-10-CM

## 2012-11-09 DIAGNOSIS — K5730 Diverticulosis of large intestine without perforation or abscess without bleeding: Secondary | ICD-10-CM | POA: Diagnosis not present

## 2012-11-09 DIAGNOSIS — C8590 Non-Hodgkin lymphoma, unspecified, unspecified site: Secondary | ICD-10-CM

## 2012-11-09 DIAGNOSIS — I700 Atherosclerosis of aorta: Secondary | ICD-10-CM | POA: Insufficient documentation

## 2012-11-09 DIAGNOSIS — I2510 Atherosclerotic heart disease of native coronary artery without angina pectoris: Secondary | ICD-10-CM | POA: Diagnosis not present

## 2012-11-09 DIAGNOSIS — C7900 Secondary malignant neoplasm of unspecified kidney and renal pelvis: Secondary | ICD-10-CM | POA: Diagnosis not present

## 2012-11-09 DIAGNOSIS — R918 Other nonspecific abnormal finding of lung field: Secondary | ICD-10-CM | POA: Insufficient documentation

## 2012-11-09 DIAGNOSIS — C649 Malignant neoplasm of unspecified kidney, except renal pelvis: Secondary | ICD-10-CM | POA: Insufficient documentation

## 2012-11-09 DIAGNOSIS — Z9221 Personal history of antineoplastic chemotherapy: Secondary | ICD-10-CM | POA: Insufficient documentation

## 2012-11-09 DIAGNOSIS — Z905 Acquired absence of kidney: Secondary | ICD-10-CM | POA: Diagnosis not present

## 2012-11-09 DIAGNOSIS — K7689 Other specified diseases of liver: Secondary | ICD-10-CM | POA: Insufficient documentation

## 2012-11-09 DIAGNOSIS — M899 Disorder of bone, unspecified: Secondary | ICD-10-CM | POA: Insufficient documentation

## 2012-11-09 DIAGNOSIS — C801 Malignant (primary) neoplasm, unspecified: Secondary | ICD-10-CM | POA: Diagnosis not present

## 2012-11-16 ENCOUNTER — Other Ambulatory Visit (HOSPITAL_BASED_OUTPATIENT_CLINIC_OR_DEPARTMENT_OTHER): Payer: Medicare Other | Admitting: Lab

## 2012-11-16 ENCOUNTER — Ambulatory Visit (HOSPITAL_BASED_OUTPATIENT_CLINIC_OR_DEPARTMENT_OTHER): Payer: Medicare Other | Admitting: Internal Medicine

## 2012-11-16 ENCOUNTER — Encounter: Payer: Self-pay | Admitting: Internal Medicine

## 2012-11-16 ENCOUNTER — Ambulatory Visit (HOSPITAL_BASED_OUTPATIENT_CLINIC_OR_DEPARTMENT_OTHER): Payer: Medicare Other

## 2012-11-16 ENCOUNTER — Telehealth: Payer: Self-pay | Admitting: Internal Medicine

## 2012-11-16 VITALS — BP 152/86 | HR 64 | Temp 96.9°F | Resp 18 | Ht 62.0 in | Wt 115.4 lb

## 2012-11-16 DIAGNOSIS — C649 Malignant neoplasm of unspecified kidney, except renal pelvis: Secondary | ICD-10-CM

## 2012-11-16 DIAGNOSIS — C8589 Other specified types of non-Hodgkin lymphoma, extranodal and solid organ sites: Secondary | ICD-10-CM

## 2012-11-16 DIAGNOSIS — Z95828 Presence of other vascular implants and grafts: Secondary | ICD-10-CM

## 2012-11-16 DIAGNOSIS — C8590 Non-Hodgkin lymphoma, unspecified, unspecified site: Secondary | ICD-10-CM

## 2012-11-16 DIAGNOSIS — C641 Malignant neoplasm of right kidney, except renal pelvis: Secondary | ICD-10-CM

## 2012-11-16 DIAGNOSIS — Z452 Encounter for adjustment and management of vascular access device: Secondary | ICD-10-CM

## 2012-11-16 DIAGNOSIS — C7800 Secondary malignant neoplasm of unspecified lung: Secondary | ICD-10-CM | POA: Diagnosis not present

## 2012-11-16 LAB — COMPREHENSIVE METABOLIC PANEL (CC13)
ALT: 23 U/L (ref 0–55)
AST: 23 U/L (ref 5–34)
Albumin: 3.8 g/dL (ref 3.5–5.0)
Calcium: 9.8 mg/dL (ref 8.4–10.4)
Chloride: 96 mEq/L — ABNORMAL LOW (ref 98–109)
Potassium: 3.3 mEq/L — ABNORMAL LOW (ref 3.5–5.1)
Sodium: 133 mEq/L — ABNORMAL LOW (ref 136–145)
Total Protein: 6.3 g/dL — ABNORMAL LOW (ref 6.4–8.3)

## 2012-11-16 LAB — CBC WITH DIFFERENTIAL/PLATELET
BASO%: 0.3 % (ref 0.0–2.0)
Basophils Absolute: 0 10*3/uL (ref 0.0–0.1)
EOS%: 1.8 % (ref 0.0–7.0)
HGB: 14 g/dL (ref 11.6–15.9)
MCH: 35 pg — ABNORMAL HIGH (ref 25.1–34.0)
MCHC: 35.7 g/dL (ref 31.5–36.0)
RDW: 19.2 % — ABNORMAL HIGH (ref 11.2–14.5)
lymph#: 0.8 10*3/uL — ABNORMAL LOW (ref 0.9–3.3)

## 2012-11-16 LAB — LACTATE DEHYDROGENASE (CC13): LDH: 202 U/L (ref 125–245)

## 2012-11-16 MED ORDER — SODIUM CHLORIDE 0.9 % IJ SOLN
10.0000 mL | INTRAMUSCULAR | Status: DC | PRN
  Administered 2012-11-16: 10 mL via INTRAVENOUS
  Filled 2012-11-16: qty 10

## 2012-11-16 MED ORDER — HEPARIN SOD (PORK) LOCK FLUSH 100 UNIT/ML IV SOLN
500.0000 [IU] | Freq: Once | INTRAVENOUS | Status: AC
  Administered 2012-11-16: 500 [IU] via INTRAVENOUS
  Filled 2012-11-16: qty 5

## 2012-11-16 NOTE — Patient Instructions (Signed)
The scan showed no evidence for disease progression. Continue Votrient 800 mg by mouth daily. Followup visit in one month.

## 2012-11-16 NOTE — Progress Notes (Signed)
[facility] Telephone:([phone]   Fax:([phone]) [phone]  OFFICE PROGRESS NOTE  [name], MD [address] [place] [address]  DIAGNOSIS:  #1 non-Hodgkin's lymphoma diagnosed on bone marrow biopsy in June of 2012  #2 metastatic renal cell carcinoma initially diagnosed as stage III (T3a N0M0) renal cell carcinoma diagnosed in June of 2012 with pulmonary metastasis diagnosed in August of 2014.  PRIOR THERAPY:  1) Status post right nephrectomy under the care of Dr. Retta Diones on 09/02/2010.  2) Systemic chemotherapy with CHOP/Rituxan given every 3 weeks, status post 6 cycles.   CURRENT THERAPY: Votrient 800 mg by mouth daily. Status post 2 months of treatment.   INTERVAL HISTORY: DEADRA DIGGINS 79 y.o. female returns to the clinic today for followup visit accompanied by her son. The patient is feeling fine today with no specific complaints except for mild fatigue. She denied having any significant weight loss or night sweats. The patient denied having any chest pain, shortness of breath, cough or hemoptysis. She has no nausea or vomiting. The patient denied having any fever or chills. She is tolerating her treatment with Votrient fairly well. Patient had repeat CT scan of the chest, abdomen and pelvis performed recently and she is here for evaluation and discussion of her scan results.  MEDICAL HISTORY: Past Medical History  Diagnosis Date  . Hypercholesteremia     takes Zocor daily  . Glaucoma   . Anemia   . Stroke     hx of TIA  . Blood transfusion   . Thrush 12/17/2010  . Renal cell carcinoma   . Non Hodgkin's lymphoma   . Cancer     kidney cancer, NHL   . Cancer     last chemo 11/04/10   . Hypertension     takes Nadolol daily  . Hypothyroidism     takes Synthroid daily  . Hypothyroid   . PONV (postoperative nausea and vomiting)   . Shortness of breath     with exertion  . Headache(784.0)     hx of migraines  . TIA  (transient ischemic attack)   . Peripheral neuropathy   . Arthritis     knee /hip  . Scoliosis   . History of bladder infections   . History of blood transfusion   . Cataract     right  . History of shingles     ALLERGIES:  is allergic to crab and dilaudid.  MEDICATIONS:  Current Outpatient Prescriptions  Medication Sig Dispense Refill  . amLODipine (NORVASC) 5 MG tablet Take 1 tablet (5 mg total) by mouth daily.  30 tablet  0  . aspirin EC 81 MG tablet Take 81 mg by mouth at bedtime.       . Calcium Citrate (CITRACAL PO) Take 1 tablet by mouth 3 (three) times a week.       . cholecalciferol (VITAMIN D) 400 UNITS TABS Take 400 Units by mouth daily.        [name] levothyroxine (SYNTHROID, LEVOTHROID) 50 MCG tablet Take 50 mcg by mouth every morning.       [name] losartan-hydrochlorothiazide (HYZAAR) 50-12.5 MG per tablet Take 1 tablet by mouth daily.      . Multiple Vitamin (MULITIVITAMIN WITH MINERALS) TABS Take 1 tablet by mouth daily.        . nadolol (CORGARD) 40 MG tablet Take 40 mg by mouth at bedtime.       . pazopanib (  VOTRIENT) 200 MG tablet Take 4 tablets (800 mg total) by mouth daily. Take on an empty stomach.  120 tablet  2  . sodium chloride (MURO 128) 5 % ophthalmic solution Place 1 drop into the left eye 3 (three) times daily.      . vitamin B-12 (CYANOCOBALAMIN) 1000 MCG tablet Place 1,000 mcg under the tongue daily.        No current facility-administered medications for this visit.   Facility-Administered Medications Ordered in Other Visits  Medication Dose Route Frequency Provider Last Rate Last Dose  . sodium chloride 0.9 % injection 10 mL  10 mL Intravenous PRN Si Gaul, MD   10 mL at 05/17/12 0846    SURGICAL HISTORY:  Past Surgical History  Procedure Laterality Date  . Nephrectomy      Right Side  . Portacath placement      right subclavian  . Tonsillectomy      as a child  . Mouth surgery    . Left cataract removed    . Video bronchoscopy with  endobronchial ultrasound N/A 08/06/2012    Procedure: VIDEO BRONCHOSCOPY WITH ENDOBRONCHIAL ULTRASOUND;  Surgeon: Leslye Peer, MD;  Location: [facility] OR;  Service: Thoracic;  Laterality: N/A;  . Video bronchoscopy with endobronchial navigation N/A 08/06/2012    Procedure: VIDEO BRONCHOSCOPY WITH ENDOBRONCHIAL NAVIGATION;  Surgeon: Leslye Peer, MD;  Location: [facility] OR;  Service: Thoracic;  Laterality: N/A;    REVIEW OF SYSTEMS:  Constitutional: negative Eyes: negative Ears, nose, mouth, throat, and face: negative Respiratory: negative Cardiovascular: negative Gastrointestinal: negative Genitourinary:negative Integument/breast: negative Hematologic/lymphatic: negative Musculoskeletal:negative Neurological: negative Behavioral/Psych: negative Endocrine: negative Allergic/Immunologic: negative   PHYSICAL EXAMINATION: General appearance: alert, cooperative, fatigued and no distress Head: Normocephalic, without obvious abnormality, atraumatic Neck: no adenopathy Lymph nodes: Cervical, supraclavicular, and axillary nodes normal. Resp: clear to auscultation bilaterally Cardio: regular rate and rhythm, S1, S2 normal, no murmur, click, rub or gallop GI: soft, non-tender; bowel sounds normal; no masses,  no organomegaly Extremities: extremities normal, atraumatic, no cyanosis or edema Neurologic: Alert and oriented X 3, normal strength and tone. Normal symmetric reflexes. Normal coordination and gait  ECOG PERFORMANCE STATUS: 1 - Symptomatic but completely ambulatory  Blood pressure 152/86, pulse 64, temperature 96.9 F (36.1 C), temperature source Oral, resp. rate 18, height 5\' 2"  (1.575 m), weight 115 lb 6.4 oz (52.345 kg).  LABORATORY DATA: Lab Results  Component Value Date   WBC 3.9 11/16/2012   HGB 14.0 11/16/2012   HCT 39.4 11/16/2012   MCV 98.1 11/16/2012   PLT 221 11/16/2012      Chemistry      Component Value Date/Time   NA 142 10/18/2012 0839   NA 139 08/04/2012 0844   K  4.1 10/18/2012 0839   K 4.0 08/04/2012 0844   CL 101 08/04/2012 0844   CL 102 04/01/2012 0805   CO2 29 10/18/2012 0839   CO2 30 08/04/2012 0844   BUN 13.2 10/18/2012 0839   BUN 16 08/04/2012 0844   CREATININE 0.9 10/18/2012 0839   CREATININE 1.00 08/04/2012 0844      Component Value Date/Time   CALCIUM 9.8 10/18/2012 0839   CALCIUM 9.8 08/04/2012 0844   ALKPHOS 124 10/18/2012 0839   ALKPHOS 106 03/12/2011 0842   AST 22 10/18/2012 0839   AST 22 03/12/2011 0842   ALT 17 10/18/2012 0839   ALT 12 03/12/2011 0842   BILITOT 0.84 10/18/2012 0839   BILITOT 0.4 03/12/2011 4098  RADIOGRAPHIC STUDIES: Ct Chest Wo Contrast  11/09/2012   CLINICAL DATA:  Metastatic renal cell carcinoma. Right nephrectomy 2012. Non-Hodgkin's lymphoma 2012. Last chemotherapy in 2013.  EXAM: CT CHEST, ABDOMEN AND PELVIS WITHOUT CONTRAST  TECHNIQUE: Multidetector CT imaging of the chest, abdomen and pelvis was performed following the standard protocol without IV contrast.  COMPARISON:  PET 07/13/2012. Chest abdomen pelvic CTs 04/01/2012. Chest CT 07/02/2012.  FINDINGS:   CT CHEST FINDINGS  Lungs/Pleura: Right middle lobe lung nodules. The more cephalad measures 1.3 cm on image 35 versus 1.8 cm on 07/13/2012.  The more inferior and posterior measures 1.0 cm on image 37 versus 1.9 cm on the a prior PET.  Minimal subpleural nodularity or pleural thickening along the right major fissure on image 28.  3 mm left upper lobe lung nodule on image 15 which is likely similar to on the prior exam of 07/02/2012.  No pleural fluid.  Heart/Mediastinum: No supraclavicular adenopathy. A right-sided Port-A-Cath which terminates at the caval atrial junction.  Ascending aorta upper normal in size, 3.9 cm. Atherosclerosis within the descending segment. Heart size upper normal, without pericardial effusion. Multivessel coronary artery atherosclerosis.  A right paratracheal/precarinal node measures 9 mm on image 24 versus 7 mm on the prior PET.   Subcarinal node measures 1.2 cm on image 27 versus 1.5 cm on the prior. Hilar regions poorly evaluated without intravenous contrast.    CT ABDOMEN AND PELVIS FINDINGS  Abdomen/Pelvis: Right liver lobe 1.4 cm well-circumscribed hypo attenuating lesion is similar and likely a cyst. No suspicious liver lesion. Normal spleen. Underdistended proximal stomach. Normal pancreas, gallbladder, biliary tract, adrenal glands.  Normal appearance of the left kidney. Status post right nephrectomy, without locally recurrent disease.  No retroperitoneal or retrocrural adenopathy. Scattered colonic diverticula. Normal terminal ileum. Normal small bowel without abdominal ascites. No pelvic adenopathy. Normal urinary bladder. Hyperattenuating focus within the uterine fundus on image 92 measures 1.3 cm. Likely present on the prior exams including back to 02/14/2011.  Redemonstration of a low-density left ovarian lesion of 2.9 cm. No significant free fluid.  Bones/Musculoskeletal: Moderate osteopenia. No acute osseous abnormality. Convex right lumbar spine curvature.    IMPRESSION: CT CHEST IMPRESSION  1. Response to therapy, as evidenced by decreased size of right middle lobe lung nodules and a subcarinal lymph node. 2. Slight enlargement of a low right paratracheal/precarinal node. This is not pathologic by size criteria, but warrants followup attention. 3. Otherwise, no evidence of new or progressive disease within the chest.  CT ABDOMEN AND PELVIS IMPRESSION  1. Status post right nephrectomy. No evidence of acute process or metastatic disease within the abdomen/ pelvis. 2. Similar indeterminate low-density left ovarian lesion. 3. Similar focus of hyper attenuation within the central uterine fundus. This could represent a submucosal fibroid or even an endometrial polyp. If clinically indicated, further characterization with pelvic ultrasound should be considered.   Electronically Signed   By: Jeronimo Greaves M.D.   On: 11/09/2012  09:10   ASSESSMENT AND PLAN: This is a very pleasant 79 years old white female with metastatic renal cell carcinoma.  She is currently on treatment with Votrient 800 mg by mouth daily and tolerating it fairly well. The recent scan showed improvement in her disease especially with decrease in the right middle lobe lung nodules and subcarinal lymphadenopathy. I discussed the scan results with the patient and her son. I recommended for her to continue on treatment with Votrient with the same dose. She would come back for followup  visit in one month for reevaluation with repeat blood work.  She was advised to call immediately if she has any concerning symptoms in the interval. The patient voices understanding of current disease status and treatment options and is in agreement with the current care plan.  All questions were answered. The patient knows to call the clinic with any problems, questions or concerns. We can certainly see the patient much sooner if necessary.  I spent 15 minutes counseling the patient face to face. The total time spent in the appointment was 25 minutes.

## 2012-11-16 NOTE — Patient Instructions (Signed)
Implanted Port Instructions  An implanted port is a central line that has a round shape and is placed under the skin. It is used for long-term IV (intravenous) access for:  · Medicine.  · Fluids.  · Liquid nutrition, such as TPN (total parenteral nutrition).  · Blood samples.  Ports can be placed:  · In the chest area just below the collarbone (this is the most common place.)  · In the arms.  · In the belly (abdomen) area.  · In the legs.  PARTS OF THE PORT  A port has 2 main parts:  · The reservoir. The reservoir is round, disc-shaped, and will be a small, raised area under your skin.  · The reservoir is the part where a needle is inserted (accessed) to either give medicines or to draw blood.  · The catheter. The catheter is a long, slender tube that extends from the reservoir. The catheter is placed into a large vein.  · Medicine that is inserted into the reservoir goes into the catheter and then into the vein.  INSERTION OF THE PORT  · The port is surgically placed in either an operating room or in a procedural area (interventional radiology).  · Medicine may be given to help you relax during the procedure.  · The skin where the port will be inserted is numbed (local anesthetic).  · 1 or 2 small cuts (incisions) will be made in the skin to insert the port.  · The port can be used after it has been inserted.  INCISION SITE CARE  · The incision site may have small adhesive strips on it. This helps keep the incision site closed. Sometimes, no adhesive strips are placed. Instead of adhesive strips, a special kind of surgical glue is used to keep the incision closed.  · If adhesive strips were placed on the incision sites, do not take them off. They will fall off on their own.  · The incision site may be sore for 1 to 2 days. Pain medicine can help.  · Do not get the incision site wet. Bathe or shower as directed by your caregiver.  · The incision site should heal in 5 to 7 days. A small scar may form after the  incision has healed.  ACCESSING THE PORT  Special steps must be taken to access the port:  · Before the port is accessed, a numbing cream can be placed on the skin. This helps numb the skin over the port site.  · A sterile technique is used to access the port.  · The port is accessed with a needle. Only "non-coring" port needles should be used to access the port. Once the port is accessed, a blood return should be checked. This helps ensure the port is in the vein and is not clogged (clotted).  · If your caregiver believes your port should remain accessed, a clear (transparent) bandage will be placed over the needle site. The bandage and needle will need to be changed every week or as directed by your caregiver.  · Keep the bandage covering the needle clean and dry. Do not get it wet. Follow your caregiver's instructions on how to take a shower or bath when the port is accessed.  · If your port does not need to stay accessed, no bandage is needed over the port.  FLUSHING THE PORT  Flushing the port keeps it from getting clogged. How often the port is flushed depends on:  · If a   constant infusion is running. If a constant infusion is running, the port may not need to be flushed.  · If intermittent medicines are given.  · If the port is not being used.  For intermittent medicines:  · The port will need to be flushed:  · After medicines have been given.  · After blood has been drawn.  · As part of routine maintenance.  · A port is normally flushed with:  · Normal saline.  · Heparin.  · Follow your caregiver's advice on how often, how much, and the type of flush to use on your port.  IMPORTANT PORT INFORMATION  · Tell your caregiver if you are allergic to heparin.  · After your port is placed, you will get a manufacturer's information card. The card has information about your port. Keep this card with you at all times.  · There are many types of ports available. Know what kind of port you have.  · In case of an  emergency, it may be helpful to wear a medical alert bracelet. This can help alert health care workers that you have a port.  · The port can stay in for as long as your caregiver believes it is necessary.  · When it is time for the port to come out, surgery will be done to remove it. The surgery will be similar to how the port was put in.  · If you are in the hospital or clinic:  · Your port will be taken care of and flushed by a nurse.  · If you are at home:  · A home health care nurse may give medicines and take care of the port.  · You or a family member can get special training and directions for giving medicine and taking care of the port at home.  SEEK IMMEDIATE MEDICAL CARE IF:   · Your port does not flush or you are unable to get a blood return.  · New drainage or pus is coming from the incision.  · A bad smell is coming from the incision site.  · You develop swelling or increased redness at the incision site.  · You develop increased swelling or pain at the port site.  · You develop swelling or pain in the surrounding skin near the port.  · You have an oral temperature above 102° F (38.9° C), not controlled by medicine.  MAKE SURE YOU:   · Understand these instructions.  · Will watch your condition.  · Will get help right away if you are not doing well or get worse.  Document Released: 12/23/2004 Document Revised: 03/17/2011 Document Reviewed: 03/16/2008  [employer]® Patient Information ©2014 [employer].

## 2012-11-16 NOTE — Telephone Encounter (Signed)
gv and printed appt sched and avs for pt for DEC °

## 2012-11-23 ENCOUNTER — Telehealth: Payer: Self-pay | Admitting: *Deleted

## 2012-11-23 NOTE — Telephone Encounter (Signed)
Called and informed patient of abnormal lab results.  Instructed patient to increase dietary potassium (Dark leafy green vegetable, bananas, broccoli, etc.)  Per Tiana Loft, PA.  Patient verbalized understanding.

## 2012-11-23 NOTE — Telephone Encounter (Signed)
Message copied by Raphael Gibney on Tue Nov 23, 2012  3:39 PM ------      Message from: [name]      Created: Tue Nov 23, 2012  3:34 PM       Abnormal results, please call and notify patient to increase dietary potassium ------

## 2012-11-25 ENCOUNTER — Telehealth: Payer: Self-pay | Admitting: *Deleted

## 2012-11-25 NOTE — Telephone Encounter (Signed)
Message copied by Caren Griffins on Thu Nov 25, 2012  2:57 PM ------      Message from: [name]      Created: Tue Nov 23, 2012  3:34 PM       Abnormal results, please call and notify patient to increase dietary potassium ------

## 2012-11-29 ENCOUNTER — Telehealth: Payer: Self-pay | Admitting: Internal Medicine

## 2012-11-29 NOTE — Telephone Encounter (Signed)
returned pt call and r/s appt to 12.5.14 per pt request....done

## 2012-12-10 ENCOUNTER — Telehealth: Payer: Self-pay | Admitting: Internal Medicine

## 2012-12-10 ENCOUNTER — Ambulatory Visit (HOSPITAL_BASED_OUTPATIENT_CLINIC_OR_DEPARTMENT_OTHER): Payer: Medicare Other | Admitting: Physician Assistant

## 2012-12-10 ENCOUNTER — Encounter: Payer: Self-pay | Admitting: Physician Assistant

## 2012-12-10 ENCOUNTER — Other Ambulatory Visit: Payer: Medicare Other | Admitting: Lab

## 2012-12-10 VITALS — BP 132/86 | HR 68 | Temp 97.3°F | Resp 20 | Ht 62.0 in | Wt 115.8 lb

## 2012-12-10 DIAGNOSIS — C649 Malignant neoplasm of unspecified kidney, except renal pelvis: Secondary | ICD-10-CM | POA: Diagnosis not present

## 2012-12-10 DIAGNOSIS — C641 Malignant neoplasm of right kidney, except renal pelvis: Secondary | ICD-10-CM

## 2012-12-10 DIAGNOSIS — R5381 Other malaise: Secondary | ICD-10-CM

## 2012-12-10 DIAGNOSIS — R911 Solitary pulmonary nodule: Secondary | ICD-10-CM | POA: Diagnosis not present

## 2012-12-10 LAB — CBC WITH DIFFERENTIAL/PLATELET
Basophils Absolute: 0 10*3/uL (ref 0.0–0.1)
Eosinophils Absolute: 0.1 10*3/uL (ref 0.0–0.5)
HCT: 40.4 % (ref 34.8–46.6)
HGB: 14.2 g/dL (ref 11.6–15.9)
LYMPH%: 18.8 % (ref 14.0–49.7)
MCV: 104.2 fL — ABNORMAL HIGH (ref 79.5–101.0)
MONO#: 0.4 10*3/uL (ref 0.1–0.9)
MONO%: 8.4 % (ref 0.0–14.0)
NEUT#: 2.9 10*3/uL (ref 1.5–6.5)
NEUT%: 70.4 % (ref 38.4–76.8)
Platelets: 241 10*3/uL (ref 145–400)
RBC: 3.87 10*6/uL (ref 3.70–5.45)
WBC: 4.2 10*3/uL (ref 3.9–10.3)

## 2012-12-10 LAB — COMPREHENSIVE METABOLIC PANEL (CC13)
ALT: 18 U/L (ref 0–55)
Anion Gap: 12 mEq/L — ABNORMAL HIGH (ref 3–11)
CO2: 30 mEq/L — ABNORMAL HIGH (ref 22–29)
Calcium: 10 mg/dL (ref 8.4–10.4)
Chloride: 95 mEq/L — ABNORMAL LOW (ref 98–109)
Creatinine: 1 mg/dL (ref 0.6–1.1)
Glucose: 132 mg/dl (ref 70–140)
Potassium: 3.9 mEq/L (ref 3.5–5.1)
Total Bilirubin: 0.77 mg/dL (ref 0.20–1.20)

## 2012-12-10 LAB — LACTATE DEHYDROGENASE (CC13): LDH: 194 U/L (ref 125–245)

## 2012-12-10 NOTE — Patient Instructions (Signed)
Continue taking Votrient 800 mg by mouth daily Monitor your muscle aches and notify us immediately if they persist or worsen Followup in one month for another symptom management visit

## 2012-12-10 NOTE — Progress Notes (Addendum)
[facility] [phone]   [phone]  SHARED VISIT PROGRESS NOTE  [name], MD [address] [place] [address]  DIAGNOSIS:  #1 non-Hodgkin's lymphoma diagnosed on bone marrow biopsy in June of 2012  #2 metastatic renal cell carcinoma initially diagnosed as stage III (T3a N0M0) renal cell carcinoma diagnosed in June of 2012 with pulmonary metastasis diagnosed in August of 2014.  PRIOR THERAPY:  1) Status post right nephrectomy under the care of Dr. Retta Diones on 09/02/2010.  2) Systemic chemotherapy with CHOP/Rituxan given every 3 weeks, status post 6 cycles.   CURRENT THERAPY: Votrient 800 mg by mouth daily. Status post 3 months of treatment.   INTERVAL HISTORY: Melissa Cross 79 y.o. female returns to the clinic today for followup visit accompanied by her son. The patient is feeling fine today with no specific complaints except for mild fatigue. She is also having some intermittent episodes of leg and upper arm weakness associated with some increased fatigue. If she sits and rest for a while all the symptoms dissipate. There doesn't appear to be any specific instigating factors. She also reports that her her hair is coming out. It is more thinning heavily as opposed to falling out of clumps as with her prior chemotherapy. Regarding leg and arm weakness she's not had any falls or near falls and is not dropping any items are having difficulty carrying anything.She denied having any significant weight loss or night sweats. she does report some decreased appetite am as she states food just doesn't taste good.  The patient denied having any chest pain, shortness of breath, cough or hemoptysis. She has no nausea or vomiting. The patient denied having any fever or chills. She is tolerating her treatment with Votrient fairly well.    MEDICAL HISTORY: Past Medical History  Diagnosis Date  . Hypercholesteremia     takes Zocor  daily  . Glaucoma   . Anemia   . Stroke     hx of TIA  . Blood transfusion   . Thrush 12/17/2010  . Renal cell carcinoma   . Non Hodgkin's lymphoma   . Cancer     kidney cancer, NHL   . Cancer     last chemo 11/04/10   . Hypertension     takes Nadolol daily  . Hypothyroidism     takes Synthroid daily  . Hypothyroid   . PONV (postoperative nausea and vomiting)   . Shortness of breath     with exertion  . Headache(784.0)     hx of migraines  . TIA (transient ischemic attack)   . Peripheral neuropathy   . Arthritis     knee /hip  . Scoliosis   . History of bladder infections   . History of blood transfusion   . Cataract     right  . History of shingles     ALLERGIES:  is allergic to crab and dilaudid.  MEDICATIONS:  Current Outpatient Prescriptions  Medication Sig Dispense Refill  . amLODipine (NORVASC) 5 MG tablet Take 1 tablet (5 mg total) by mouth daily.  30 tablet  0  . aspirin EC 81 MG tablet Take 81 mg by mouth at bedtime.       . Calcium Citrate (CITRACAL PO) Take 1 tablet by mouth 3 (three) times a week.       . chlorhexidine (PERIDEX) 0.12 % solution       . cholecalciferol (VITAMIN D)  400 UNITS TABS Take 400 Units by mouth daily.        [name] levothyroxine (SYNTHROID, LEVOTHROID) 50 MCG tablet Take 50 mcg by mouth every morning.       [name] losartan-hydrochlorothiazide (HYZAAR) 50-12.5 MG per tablet Take 1 tablet by mouth daily.      . Multiple Vitamin (MULITIVITAMIN WITH MINERALS) TABS Take 1 tablet by mouth daily.        . nadolol (CORGARD) 40 MG tablet Take 40 mg by mouth at bedtime.       . pazopanib (VOTRIENT) 200 MG tablet Take 4 tablets (800 mg total) by mouth daily. Take on an empty stomach.  120 tablet  2  . sodium chloride (MURO 128) 5 % ophthalmic solution Place 1 drop into the left eye 3 (three) times daily.      . vitamin B-12 (CYANOCOBALAMIN) 1000 MCG tablet Place 1,000 mcg under the tongue daily.        No current facility-administered medications  for this visit.   Facility-Administered Medications Ordered in Other Visits  Medication Dose Route Frequency Provider Last Rate Last Dose  . sodium chloride 0.9 % injection 10 mL  10 mL Intravenous PRN Si Gaul, MD   10 mL at 05/17/12 0846    SURGICAL HISTORY:  Past Surgical History  Procedure Laterality Date  . Nephrectomy      Right Side  . Portacath placement      right subclavian  . Tonsillectomy      as a child  . Mouth surgery    . Left cataract removed    . Video bronchoscopy with endobronchial ultrasound N/A 08/06/2012    Procedure: VIDEO BRONCHOSCOPY WITH ENDOBRONCHIAL ULTRASOUND;  Surgeon: Leslye Peer, MD;  Location: [facility] OR;  Service: Thoracic;  Laterality: N/A;  . Video bronchoscopy with endobronchial navigation N/A 08/06/2012    Procedure: VIDEO BRONCHOSCOPY WITH ENDOBRONCHIAL NAVIGATION;  Surgeon: Leslye Peer, MD;  Location: [facility] OR;  Service: Thoracic;  Laterality: N/A;    REVIEW OF SYSTEMS:  Constitutional: positive for fatigue Eyes: negative Ears, nose, mouth, throat, and face: negative Respiratory: negative Cardiovascular: negative Gastrointestinal: negative Genitourinary:negative Integument/breast: negative Hematologic/lymphatic: negative Musculoskeletal:positive for myalgias Neurological: negative Behavioral/Psych: negative Endocrine: negative Allergic/Immunologic: negative   PHYSICAL EXAMINATION: General appearance: alert, cooperative, fatigued and no distress Head: Normocephalic, without obvious abnormality, atraumatic Neck: no adenopathy Lymph nodes: Cervical, supraclavicular, and axillary nodes normal. Resp: clear to auscultation bilaterally Cardio: regular rate and rhythm, S1, S2 normal, no murmur, click, rub or gallop GI: soft, non-tender; bowel sounds normal; no masses,  no organomegaly Extremities: extremities normal, atraumatic, no cyanosis or edema Neurologic: Alert and oriented X 3, normal strength and tone. Normal symmetric reflexes.  Normal coordination and gait Of note patient was able to climb onto the exam table looks well and step down off the exam table without assistance   ECOG PERFORMANCE STATUS: 1 - Symptomatic but completely ambulatory  Blood pressure 132/86, pulse 68, temperature 97.3 F (36.3 C), temperature source Oral, resp. rate 20, height 5\' 2"  (1.575 m), weight 115 lb 12.8 oz (52.527 kg).  LABORATORY DATA: Lab Results  Component Value Date   WBC 4.2 12/10/2012   HGB 14.2 12/10/2012   HCT 40.4 12/10/2012   MCV 104.2* 12/10/2012   PLT 241 12/10/2012      Chemistry      Component Value Date/Time   NA 136 12/10/2012 0908   NA 139 08/04/2012 0844   K 3.9 12/10/2012 0908   K 4.0  08/04/2012 0844   CL 101 08/04/2012 0844   CL 102 04/01/2012 0805   CO2 30* 12/10/2012 0908   CO2 30 08/04/2012 0844   BUN 13.4 12/10/2012 0908   BUN 16 08/04/2012 0844   CREATININE 1.0 12/10/2012 0908   CREATININE 1.00 08/04/2012 0844      Component Value Date/Time   CALCIUM 10.0 12/10/2012 0908   CALCIUM 9.8 08/04/2012 0844   ALKPHOS 125 12/10/2012 0908   ALKPHOS 106 03/12/2011 0842   AST 23 12/10/2012 0908   AST 22 03/12/2011 0842   ALT 18 12/10/2012 0908   ALT 12 03/12/2011 0842   BILITOT 0.77 12/10/2012 0908   BILITOT 0.4 03/12/2011 0842       RADIOGRAPHIC STUDIES: Ct Chest Wo Contrast  11/09/2012   CLINICAL DATA:  Metastatic renal cell carcinoma. Right nephrectomy 2012. Non-Hodgkin's lymphoma 2012. Last chemotherapy in 2013.  EXAM: CT CHEST, ABDOMEN AND PELVIS WITHOUT CONTRAST  TECHNIQUE: Multidetector CT imaging of the chest, abdomen and pelvis was performed following the standard protocol without IV contrast.  COMPARISON:  PET 07/13/2012. Chest abdomen pelvic CTs 04/01/2012. Chest CT 07/02/2012.  FINDINGS:   CT CHEST FINDINGS  Lungs/Pleura: Right middle lobe lung nodules. The more cephalad measures 1.3 cm on image 35 versus 1.8 cm on 07/13/2012.  The more inferior and posterior measures 1.0 cm on image 37 versus 1.9 cm on the a  prior PET.  Minimal subpleural nodularity or pleural thickening along the right major fissure on image 28.  3 mm left upper lobe lung nodule on image 15 which is likely similar to on the prior exam of 07/02/2012.  No pleural fluid.  Heart/Mediastinum: No supraclavicular adenopathy. A right-sided Port-A-Cath which terminates at the caval atrial junction.  Ascending aorta upper normal in size, 3.9 cm. Atherosclerosis within the descending segment. Heart size upper normal, without pericardial effusion. Multivessel coronary artery atherosclerosis.  A right paratracheal/precarinal node measures 9 mm on image 24 versus 7 mm on the prior PET.  Subcarinal node measures 1.2 cm on image 27 versus 1.5 cm on the prior. Hilar regions poorly evaluated without intravenous contrast.    CT ABDOMEN AND PELVIS FINDINGS  Abdomen/Pelvis: Right liver lobe 1.4 cm well-circumscribed hypo attenuating lesion is similar and likely a cyst. No suspicious liver lesion. Normal spleen. Underdistended proximal stomach. Normal pancreas, gallbladder, biliary tract, adrenal glands.  Normal appearance of the left kidney. Status post right nephrectomy, without locally recurrent disease.  No retroperitoneal or retrocrural adenopathy. Scattered colonic diverticula. Normal terminal ileum. Normal small bowel without abdominal ascites. No pelvic adenopathy. Normal urinary bladder. Hyperattenuating focus within the uterine fundus on image 92 measures 1.3 cm. Likely present on the prior exams including back to 02/14/2011.  Redemonstration of a low-density left ovarian lesion of 2.9 cm. No significant free fluid.  Bones/Musculoskeletal: Moderate osteopenia. No acute osseous abnormality. Convex right lumbar spine curvature.    IMPRESSION: CT CHEST IMPRESSION  1. Response to therapy, as evidenced by decreased size of right middle lobe lung nodules and a subcarinal lymph node. 2. Slight enlargement of a low right paratracheal/precarinal node. This is not  pathologic by size criteria, but warrants followup attention. 3. Otherwise, no evidence of new or progressive disease within the chest.  CT ABDOMEN AND PELVIS IMPRESSION  1. Status post right nephrectomy. No evidence of acute process or metastatic disease within the abdomen/ pelvis. 2. Similar indeterminate low-density left ovarian lesion. 3. Similar focus of hyper attenuation within the central uterine fundus. This could represent a  submucosal fibroid or even an endometrial polyp. If clinically indicated, further characterization with pelvic ultrasound should be considered.   Electronically Signed   By: Jeronimo Greaves M.D.   On: 11/09/2012 09:10   ASSESSMENT AND PLAN: This is a very pleasant 79 years old white female with metastatic renal cell carcinoma.  She is currently on treatment with Votrient 800 mg by mouth daily and tolerating it fairly well. The recent scan showed improvement in her disease especially with decrease in the right middle lobe lung nodules and subcarinal lymphadenopathy. patient was discussed with also seen by Dr. Arbutus Ped. Her myalgias are likely secondary to theVotrient. We will continue to monitor the symptoms. She will continue on the Votrient st 800 mg by mouth daily and followup in one month for another symptom management visit with a repeat CBC differential, C. met and LDH. She will be due another restaging CT scan in February 2015   [place], [place], [place]   She was advised to call immediately if she has any concerning symptoms in the interval. The patient voices understanding of current disease status and treatment options and is in agreement with the current care plan.  All questions were answered. The patient knows to call the clinic with any problems, questions or concerns. We can certainly see the patient much sooner if necessary.  ADDENDUM:  Hematology/Oncology Attending:  I had a face to face encounter with the patient. I recommended her current plan. This is a very  pleasant 79 years old white female with metastatic renal cell carcinoma currently on treatment with Votrient is status post 3 months of treatment. She is tolerating her treatment fairly well except for mild fatigue and weakness in the lower extremities. I recommended for the patient to continue her treatment with Votrient as scheduled. She would come back for follow up visit in one month's for reevaluation. She was advised to call immediately if she has any concerning symptoms in the interval. Lajuana Matte., MD 12/11/2012

## 2012-12-10 NOTE — Telephone Encounter (Signed)
appts made per 12/5 POF AVS and CAL given shh °

## 2012-12-14 ENCOUNTER — Other Ambulatory Visit: Payer: Medicare Other | Admitting: Lab

## 2012-12-14 ENCOUNTER — Ambulatory Visit: Payer: Medicare Other | Admitting: Physician Assistant

## 2013-01-10 ENCOUNTER — Ambulatory Visit (HOSPITAL_BASED_OUTPATIENT_CLINIC_OR_DEPARTMENT_OTHER): Payer: Medicare Other | Admitting: Internal Medicine

## 2013-01-10 ENCOUNTER — Encounter: Payer: Self-pay | Admitting: Internal Medicine

## 2013-01-10 ENCOUNTER — Telehealth: Payer: Self-pay | Admitting: Medical Oncology

## 2013-01-10 ENCOUNTER — Other Ambulatory Visit (HOSPITAL_BASED_OUTPATIENT_CLINIC_OR_DEPARTMENT_OTHER): Payer: Medicare Other

## 2013-01-10 ENCOUNTER — Telehealth: Payer: Self-pay | Admitting: Internal Medicine

## 2013-01-10 ENCOUNTER — Ambulatory Visit (HOSPITAL_BASED_OUTPATIENT_CLINIC_OR_DEPARTMENT_OTHER): Payer: Medicare Other

## 2013-01-10 VITALS — BP 138/72 | HR 61 | Temp 98.0°F | Resp 18 | Ht 62.0 in | Wt 115.1 lb

## 2013-01-10 DIAGNOSIS — C641 Malignant neoplasm of right kidney, except renal pelvis: Secondary | ICD-10-CM

## 2013-01-10 DIAGNOSIS — C8589 Other specified types of non-Hodgkin lymphoma, extranodal and solid organ sites: Secondary | ICD-10-CM

## 2013-01-10 DIAGNOSIS — Z452 Encounter for adjustment and management of vascular access device: Secondary | ICD-10-CM | POA: Diagnosis not present

## 2013-01-10 DIAGNOSIS — Z95828 Presence of other vascular implants and grafts: Secondary | ICD-10-CM

## 2013-01-10 DIAGNOSIS — C7800 Secondary malignant neoplasm of unspecified lung: Secondary | ICD-10-CM

## 2013-01-10 DIAGNOSIS — C649 Malignant neoplasm of unspecified kidney, except renal pelvis: Secondary | ICD-10-CM

## 2013-01-10 DIAGNOSIS — C8590 Non-Hodgkin lymphoma, unspecified, unspecified site: Secondary | ICD-10-CM

## 2013-01-10 LAB — CBC WITH DIFFERENTIAL/PLATELET
BASO%: 0.4 % (ref 0.0–2.0)
Basophils Absolute: 0 10*3/uL (ref 0.0–0.1)
EOS%: 1.5 % (ref 0.0–7.0)
Eosinophils Absolute: 0.1 10*3/uL (ref 0.0–0.5)
HCT: 37.5 % (ref 34.8–46.6)
HGB: 13.6 g/dL (ref 11.6–15.9)
LYMPH%: 10.6 % — AB (ref 14.0–49.7)
MCH: 38.8 pg — ABNORMAL HIGH (ref 25.1–34.0)
MCHC: 36.3 g/dL — ABNORMAL HIGH (ref 31.5–36.0)
MCV: 106.9 fL — AB (ref 79.5–101.0)
MONO#: 0.4 10*3/uL (ref 0.1–0.9)
MONO%: 7.6 % (ref 0.0–14.0)
NEUT%: 79.9 % — ABNORMAL HIGH (ref 38.4–76.8)
NEUTROS ABS: 4.1 10*3/uL (ref 1.5–6.5)
Platelets: 206 10*3/uL (ref 145–400)
RBC: 3.51 10*6/uL — AB (ref 3.70–5.45)
RDW: 15.9 % — ABNORMAL HIGH (ref 11.2–14.5)
WBC: 5.1 10*3/uL (ref 3.9–10.3)
lymph#: 0.5 10*3/uL — ABNORMAL LOW (ref 0.9–3.3)

## 2013-01-10 LAB — COMPREHENSIVE METABOLIC PANEL (CC13)
ALK PHOS: 85 U/L (ref 40–150)
ALT: 18 U/L (ref 0–55)
AST: 19 U/L (ref 5–34)
Albumin: 3.8 g/dL (ref 3.5–5.0)
Anion Gap: 12 mEq/L — ABNORMAL HIGH (ref 3–11)
BUN: 16.5 mg/dL (ref 7.0–26.0)
CO2: 27 mEq/L (ref 22–29)
Calcium: 9.5 mg/dL (ref 8.4–10.4)
Chloride: 95 mEq/L — ABNORMAL LOW (ref 98–109)
Creatinine: 0.9 mg/dL (ref 0.6–1.1)
Glucose: 131 mg/dl (ref 70–140)
Potassium: 3.1 mEq/L — ABNORMAL LOW (ref 3.5–5.1)
SODIUM: 134 meq/L — AB (ref 136–145)
TOTAL PROTEIN: 6.3 g/dL — AB (ref 6.4–8.3)
Total Bilirubin: 0.89 mg/dL (ref 0.20–1.20)

## 2013-01-10 LAB — LACTATE DEHYDROGENASE (CC13): LDH: 206 U/L (ref 125–245)

## 2013-01-10 MED ORDER — SODIUM CHLORIDE 0.9 % IJ SOLN
10.0000 mL | INTRAMUSCULAR | Status: DC | PRN
  Administered 2013-01-10: 10 mL via INTRAVENOUS
  Filled 2013-01-10: qty 10

## 2013-01-10 MED ORDER — HEPARIN SOD (PORK) LOCK FLUSH 100 UNIT/ML IV SOLN
500.0000 [IU] | Freq: Once | INTRAVENOUS | Status: AC
  Administered 2013-01-10: 500 [IU] via INTRAVENOUS
  Filled 2013-01-10: qty 5

## 2013-01-10 NOTE — Patient Instructions (Signed)
Implanted Port Instructions  An implanted port is a central line that has a round shape and is placed under the skin. It is used for long-term IV (intravenous) access for:  · Medicine.  · Fluids.  · Liquid nutrition, such as TPN (total parenteral nutrition).  · Blood samples.  Ports can be placed:  · In the chest area just below the collarbone (this is the most common place.)  · In the arms.  · In the belly (abdomen) area.  · In the legs.  PARTS OF THE PORT  A port has 2 main parts:  · The reservoir. The reservoir is round, disc-shaped, and will be a small, raised area under your skin.  · The reservoir is the part where a needle is inserted (accessed) to either give medicines or to draw blood.  · The catheter. The catheter is a long, slender tube that extends from the reservoir. The catheter is placed into a large vein.  · Medicine that is inserted into the reservoir goes into the catheter and then into the vein.  INSERTION OF THE PORT  · The port is surgically placed in either an operating room or in a procedural area (interventional radiology).  · Medicine may be given to help you relax during the procedure.  · The skin where the port will be inserted is numbed (local anesthetic).  · 1 or 2 small cuts (incisions) will be made in the skin to insert the port.  · The port can be used after it has been inserted.  INCISION SITE CARE  · The incision site may have small adhesive strips on it. This helps keep the incision site closed. Sometimes, no adhesive strips are placed. Instead of adhesive strips, a special kind of surgical glue is used to keep the incision closed.  · If adhesive strips were placed on the incision sites, do not take them off. They will fall off on their own.  · The incision site may be sore for 1 to 2 days. Pain medicine can help.  · Do not get the incision site wet. Bathe or shower as directed by your caregiver.  · The incision site should heal in 5 to 7 days. A small scar may form after the  incision has healed.  ACCESSING THE PORT  Special steps must be taken to access the port:  · Before the port is accessed, a numbing cream can be placed on the skin. This helps numb the skin over the port site.  · A sterile technique is used to access the port.  · The port is accessed with a needle. Only "non-coring" port needles should be used to access the port. Once the port is accessed, a blood return should be checked. This helps ensure the port is in the vein and is not clogged (clotted).  · If your caregiver believes your port should remain accessed, a clear (transparent) bandage will be placed over the needle site. The bandage and needle will need to be changed every week or as directed by your caregiver.  · Keep the bandage covering the needle clean and dry. Do not get it wet. Follow your caregiver's instructions on how to take a shower or bath when the port is accessed.  · If your port does not need to stay accessed, no bandage is needed over the port.  FLUSHING THE PORT  Flushing the port keeps it from getting clogged. How often the port is flushed depends on:  · If a   constant infusion is running. If a constant infusion is running, the port may not need to be flushed.  · If intermittent medicines are given.  · If the port is not being used.  For intermittent medicines:  · The port will need to be flushed:  · After medicines have been given.  · After blood has been drawn.  · As part of routine maintenance.  · A port is normally flushed with:  · Normal saline.  · Heparin.  · Follow your caregiver's advice on how often, how much, and the type of flush to use on your port.  IMPORTANT PORT INFORMATION  · Tell your caregiver if you are allergic to heparin.  · After your port is placed, you will get a manufacturer's information card. The card has information about your port. Keep this card with you at all times.  · There are many types of ports available. Know what kind of port you have.  · In case of an  emergency, it may be helpful to wear a medical alert bracelet. This can help alert health care workers that you have a port.  · The port can stay in for as long as your caregiver believes it is necessary.  · When it is time for the port to come out, surgery will be done to remove it. The surgery will be similar to how the port was put in.  · If you are in the hospital or clinic:  · Your port will be taken care of and flushed by a nurse.  · If you are at home:  · A home health care nurse may give medicines and take care of the port.  · You or a family member can get special training and directions for giving medicine and taking care of the port at home.  SEEK IMMEDIATE MEDICAL CARE IF:   · Your port does not flush or you are unable to get a blood return.  · New drainage or pus is coming from the incision.  · A bad smell is coming from the incision site.  · You develop swelling or increased redness at the incision site.  · You develop increased swelling or pain at the port site.  · You develop swelling or pain in the surrounding skin near the port.  · You have an oral temperature above 102° F (38.9° C), not controlled by medicine.  MAKE SURE YOU:   · Understand these instructions.  · Will watch your condition.  · Will get help right away if you are not doing well or get worse.  Document Released: 12/23/2004 Document Revised: 03/17/2011 Document Reviewed: 03/16/2008  [employer]® Patient Information ©2014 [employer].

## 2013-01-10 NOTE — Telephone Encounter (Signed)
I instructed pt to call [facility] and tell the pharmacist she needs a refill on her chemotherapy.

## 2013-01-10 NOTE — Progress Notes (Signed)
[facility] [phone]   Fax:(336) [phone]  OFFICE PROGRESS NOTE  [name], MD [address] [place] [address]  DIAGNOSIS:  #1 non-Hodgkin's lymphoma diagnosed on bone marrow biopsy in June of 2012  #2 metastatic renal cell carcinoma initially diagnosed as stage III (T3a N0M0) renal cell carcinoma diagnosed in June of 2012 with pulmonary metastasis diagnosed in August of 2014.  PRIOR THERAPY:  1) Status post right nephrectomy under the care of Dr. Diona Fanti on 09/02/2010.  2) Systemic chemotherapy with CHOP/Rituxan given every 3 weeks, status post 6 cycles.   CURRENT THERAPY: Votrient 800 mg by mouth daily. Status post 4 months of treatment.  INTERVAL HISTORY: Melissa Cross 80 y.o. female returns to the clinic today for followup visit accompanied by her son. The patient is feeling fine today with no specific complaints except for mild fatigue and lack of appetite but she did not lose any weight. She denied having any significant night sweats. The patient denied having any chest pain, shortness of breath, cough or hemoptysis. She has no nausea or vomiting. The patient denied having any fever or chills. She is tolerating her treatment with Votrient fairly well.   MEDICAL HISTORY: Past Medical History  Diagnosis Date  . Hypercholesteremia     takes Zocor daily  . Glaucoma   . Anemia   . Stroke     hx of TIA  . Blood transfusion   . Thrush 12/17/2010  . Renal cell carcinoma   . Non Hodgkin's lymphoma   . Cancer     kidney cancer, NHL   . Cancer     last chemo 11/04/10   . Hypertension     takes Nadolol daily  . Hypothyroidism     takes Synthroid daily  . Hypothyroid   . PONV (postoperative nausea and vomiting)   . Shortness of breath     with exertion  . Headache(784.0)     hx of migraines  . TIA (transient ischemic attack)   . Peripheral neuropathy   . Arthritis     knee /hip  . Scoliosis   .  History of bladder infections   . History of blood transfusion   . Cataract     right  . History of shingles     ALLERGIES:  is allergic to crab and dilaudid.  MEDICATIONS:  Current Outpatient Prescriptions  Medication Sig Dispense Refill  . amLODipine (NORVASC) 5 MG tablet Take 1 tablet (5 mg total) by mouth daily.  30 tablet  0  . aspirin EC 81 MG tablet Take 81 mg by mouth at bedtime.       . Calcium Citrate (CITRACAL PO) Take 1 tablet by mouth 3 (three) times a week.       . chlorhexidine (PERIDEX) 0.12 % solution       . cholecalciferol (VITAMIN D) 400 UNITS TABS Take 400 Units by mouth daily.        [name] levothyroxine (SYNTHROID, LEVOTHROID) 50 MCG tablet Take 50 mcg by mouth every morning.       [name] losartan-hydrochlorothiazide (HYZAAR) 50-12.5 MG per tablet Take 1 tablet by mouth daily.      . Multiple Vitamin (MULITIVITAMIN WITH MINERALS) TABS Take 1 tablet by mouth daily.        . nadolol (CORGARD) 40 MG tablet Take 40 mg by mouth at bedtime.       . pazopanib (VOTRIENT) 200 MG tablet Take  4 tablets (800 mg total) by mouth daily. Take on an empty stomach.  120 tablet  2  . sodium chloride (MURO 128) 5 % ophthalmic solution Place 1 drop into the left eye 3 (three) times daily.      . vitamin B-12 (CYANOCOBALAMIN) 1000 MCG tablet Place 1,000 mcg under the tongue daily.        No current facility-administered medications for this visit.   Facility-Administered Medications Ordered in Other Visits  Medication Dose Route Frequency Provider Last Rate Last Dose  . sodium chloride 0.9 % injection 10 mL  10 mL Intravenous PRN Curt Bears, MD   10 mL at 05/17/12 0846    SURGICAL HISTORY:  Past Surgical History  Procedure Laterality Date  . Nephrectomy      Right Side  . Portacath placement      right subclavian  . Tonsillectomy      as a child  . Mouth surgery    . Left cataract removed    . Video bronchoscopy with endobronchial ultrasound N/A 08/06/2012    Procedure: VIDEO  BRONCHOSCOPY WITH ENDOBRONCHIAL ULTRASOUND;  Surgeon: Collene Gobble, MD;  Location: [facility];  Service: Thoracic;  Laterality: N/A;  . Video bronchoscopy with endobronchial navigation N/A 08/06/2012    Procedure: VIDEO BRONCHOSCOPY WITH ENDOBRONCHIAL NAVIGATION;  Surgeon: Collene Gobble, MD;  Location: [facility];  Service: Thoracic;  Laterality: N/A;    REVIEW OF SYSTEMS:  Constitutional: positive for fatigue Eyes: negative Ears, nose, mouth, throat, and face: negative Respiratory: negative Cardiovascular: negative Gastrointestinal: negative Genitourinary:negative Integument/breast: negative Hematologic/lymphatic: negative Musculoskeletal:negative Neurological: negative Behavioral/Psych: negative Endocrine: negative Allergic/Immunologic: negative   PHYSICAL EXAMINATION: General appearance: alert, cooperative, fatigued and no distress Head: Normocephalic, without obvious abnormality, atraumatic Neck: no adenopathy Lymph nodes: Cervical, supraclavicular, and axillary nodes normal. Resp: clear to auscultation bilaterally Cardio: regular rate and rhythm, S1, S2 normal, no murmur, click, rub or gallop GI: soft, non-tender; bowel sounds normal; no masses,  no organomegaly Extremities: extremities normal, atraumatic, no cyanosis or edema Neurologic: Alert and oriented X 3, normal strength and tone. Normal symmetric reflexes. Normal coordination and gait  ECOG PERFORMANCE STATUS: 1 - Symptomatic but completely ambulatory  Blood pressure 138/72, pulse 61, temperature 98 F (36.7 C), temperature source Oral, resp. rate 18, height _0  (1.575 m), weight 115 lb 1.6 oz (52.209 kg), SpO2 99.00%.  LABORATORY DATA: Lab Results  Component Value Date   WBC 5.1 01/10/2013   HGB 13.6 01/10/2013   HCT 37.5 01/10/2013   MCV 106.9* 01/10/2013   PLT 206 01/10/2013      Chemistry      Component Value Date/Time   NA 136 12/10/2012 0908   NA 139 08/04/2012 0844   K 3.9 12/10/2012 0908   K 4.0 08/04/2012 0844    CL 101 08/04/2012 0844   CL 102 04/01/2012 0805   CO2 30* 12/10/2012 0908   CO2 30 08/04/2012 0844   BUN 13.4 12/10/2012 0908   BUN 16 08/04/2012 0844   CREATININE 1.0 12/10/2012 0908   CREATININE 1.00 08/04/2012 0844      Component Value Date/Time   CALCIUM 10.0 12/10/2012 0908   CALCIUM 9.8 08/04/2012 0844   ALKPHOS 125 12/10/2012 0908   ALKPHOS 106 03/12/2011 0842   AST 23 12/10/2012 0908   AST 22 03/12/2011 0842   ALT 18 12/10/2012 0908   ALT 12 03/12/2011 0842   BILITOT 0.77 12/10/2012 0908   BILITOT 0.4 03/12/2011 0175  RADIOGRAPHIC STUDIES:  ASSESSMENT AND PLAN: This is a very pleasant 80 years old white female with metastatic renal cell carcinoma.  She is currently on treatment with Votrient 800 mg by mouth daily status post 4 months of treatment and tolerating it fairly well.   I recommended for her to continue on treatment with Votrient with the same dose. She would come back for followup visit in one month for reevaluation with repeat blood work as well as CT scan of the chest, abdomen and pelvis without contrast for restaging of her disease.  She was advised to call immediately if she has any concerning symptoms in the interval. The patient voices understanding of current disease status and treatment options and is in agreement with the current care plan.  All questions were answered. The patient knows to call the clinic with any problems, questions or concerns. We can certainly see the patient much sooner if necessary.

## 2013-01-10 NOTE — Telephone Encounter (Signed)
gv and printed appt sched and avs for pt for Feb 2015....gv pt barium

## 2013-01-10 NOTE — Patient Instructions (Signed)
Followup visit in one month with repeat CT scan of the chest, abdomen and pelvis without contrast.

## 2013-02-03 ENCOUNTER — Other Ambulatory Visit: Payer: Self-pay | Admitting: *Deleted

## 2013-02-03 DIAGNOSIS — E039 Hypothyroidism, unspecified: Secondary | ICD-10-CM

## 2013-02-03 DIAGNOSIS — C8330 Diffuse large B-cell lymphoma, unspecified site: Secondary | ICD-10-CM

## 2013-02-03 NOTE — Progress Notes (Signed)
Pt's PCP Dr Jonathon Jordan has patient on levothyroxine.  Dr Shanoah Asbill Acre is requesting a TSH be drawn with her lab work scheduled at [facility] on 02/07/13.  Per Dr Vista Mink, okay to add additional lab, TSH will be faxed to Dr Mieko Kneebone Acre office.  SLJ

## 2013-02-07 ENCOUNTER — Other Ambulatory Visit (HOSPITAL_BASED_OUTPATIENT_CLINIC_OR_DEPARTMENT_OTHER): Payer: Medicare Other

## 2013-02-07 ENCOUNTER — Ambulatory Visit (HOSPITAL_COMMUNITY)
Admission: RE | Admit: 2013-02-07 | Discharge: 2013-02-07 | Disposition: A | Discharge status: Home / Self Care | Payer: Medicare Other | Source: Ambulatory Visit | Attending: Internal Medicine | Admitting: Internal Medicine

## 2013-02-07 DIAGNOSIS — J984 Other disorders of lung: Secondary | ICD-10-CM | POA: Diagnosis not present

## 2013-02-07 DIAGNOSIS — Z905 Acquired absence of kidney: Secondary | ICD-10-CM | POA: Diagnosis not present

## 2013-02-07 DIAGNOSIS — C641 Malignant neoplasm of right kidney, except renal pelvis: Secondary | ICD-10-CM

## 2013-02-07 DIAGNOSIS — C649 Malignant neoplasm of unspecified kidney, except renal pelvis: Secondary | ICD-10-CM | POA: Diagnosis not present

## 2013-02-07 DIAGNOSIS — Z9221 Personal history of antineoplastic chemotherapy: Secondary | ICD-10-CM | POA: Insufficient documentation

## 2013-02-07 DIAGNOSIS — C8589 Other specified types of non-Hodgkin lymphoma, extranodal and solid organ sites: Secondary | ICD-10-CM | POA: Insufficient documentation

## 2013-02-07 DIAGNOSIS — E039 Hypothyroidism, unspecified: Secondary | ICD-10-CM

## 2013-02-07 DIAGNOSIS — R935 Abnormal findings on diagnostic imaging of other abdominal regions, including retroperitoneum: Secondary | ICD-10-CM | POA: Diagnosis not present

## 2013-02-07 DIAGNOSIS — C8590 Non-Hodgkin lymphoma, unspecified, unspecified site: Secondary | ICD-10-CM

## 2013-02-07 DIAGNOSIS — R918 Other nonspecific abnormal finding of lung field: Secondary | ICD-10-CM | POA: Diagnosis not present

## 2013-02-07 LAB — COMPREHENSIVE METABOLIC PANEL (CC13)
ALBUMIN: 4 g/dL (ref 3.5–5.0)
ALT: 23 U/L (ref 0–55)
AST: 25 U/L (ref 5–34)
Alkaline Phosphatase: 95 U/L (ref 40–150)
Anion Gap: 14 mEq/L — ABNORMAL HIGH (ref 3–11)
BUN: 12.4 mg/dL (ref 7.0–26.0)
CALCIUM: 9.9 mg/dL (ref 8.4–10.4)
CHLORIDE: 91 meq/L — AB (ref 98–109)
CO2: 29 meq/L (ref 22–29)
Creatinine: 0.9 mg/dL (ref 0.6–1.1)
Glucose: 84 mg/dl (ref 70–140)
Potassium: 3.5 mEq/L (ref 3.5–5.1)
Sodium: 134 mEq/L — ABNORMAL LOW (ref 136–145)
Total Bilirubin: 0.92 mg/dL (ref 0.20–1.20)
Total Protein: 6.5 g/dL (ref 6.4–8.3)

## 2013-02-07 LAB — LACTATE DEHYDROGENASE (CC13): LDH: 207 U/L (ref 125–245)

## 2013-02-07 LAB — CBC WITH DIFFERENTIAL/PLATELET
BASO%: 0.8 % (ref 0.0–2.0)
Basophils Absolute: 0 10*3/uL (ref 0.0–0.1)
EOS%: 4.4 % (ref 0.0–7.0)
Eosinophils Absolute: 0.2 10*3/uL (ref 0.0–0.5)
HCT: 40.1 % (ref 34.8–46.6)
HGB: 14.3 g/dL (ref 11.6–15.9)
LYMPH#: 0.6 10*3/uL — AB (ref 0.9–3.3)
LYMPH%: 14.5 % (ref 14.0–49.7)
MCH: 39.1 pg — AB (ref 25.1–34.0)
MCHC: 35.6 g/dL (ref 31.5–36.0)
MCV: 109.9 fL — AB (ref 79.5–101.0)
MONO#: 0.4 10*3/uL (ref 0.1–0.9)
MONO%: 9.9 % (ref 0.0–14.0)
NEUT#: 3.1 10*3/uL (ref 1.5–6.5)
NEUT%: 70.4 % (ref 38.4–76.8)
Platelets: 237 10*3/uL (ref 145–400)
RBC: 3.65 10*6/uL — AB (ref 3.70–5.45)
RDW: 14.1 % (ref 11.2–14.5)
WBC: 4.4 10*3/uL (ref 3.9–10.3)

## 2013-02-07 LAB — TSH CHCC: TSH: 1.817 m[IU]/L (ref 0.308–3.960)

## 2013-02-09 ENCOUNTER — Encounter: Payer: Self-pay | Admitting: Internal Medicine

## 2013-02-09 ENCOUNTER — Telehealth: Payer: Self-pay | Admitting: Internal Medicine

## 2013-02-09 ENCOUNTER — Ambulatory Visit (HOSPITAL_BASED_OUTPATIENT_CLINIC_OR_DEPARTMENT_OTHER): Payer: Medicare Other | Admitting: Internal Medicine

## 2013-02-09 VITALS — BP 123/78 | HR 63 | Temp 96.4°F | Resp 18 | Ht 62.0 in | Wt 113.7 lb

## 2013-02-09 DIAGNOSIS — C649 Malignant neoplasm of unspecified kidney, except renal pelvis: Secondary | ICD-10-CM

## 2013-02-09 DIAGNOSIS — C8589 Other specified types of non-Hodgkin lymphoma, extranodal and solid organ sites: Secondary | ICD-10-CM

## 2013-02-09 DIAGNOSIS — R5381 Other malaise: Secondary | ICD-10-CM

## 2013-02-09 DIAGNOSIS — R5383 Other fatigue: Secondary | ICD-10-CM | POA: Diagnosis not present

## 2013-02-09 DIAGNOSIS — C7800 Secondary malignant neoplasm of unspecified lung: Secondary | ICD-10-CM | POA: Diagnosis not present

## 2013-02-09 DIAGNOSIS — C8590 Non-Hodgkin lymphoma, unspecified, unspecified site: Secondary | ICD-10-CM

## 2013-02-09 DIAGNOSIS — R630 Anorexia: Secondary | ICD-10-CM

## 2013-02-09 MED ORDER — METHYLPREDNISOLONE (PAK) 4 MG PO TABS: ORAL_TABLET | ORAL | Status: DC

## 2013-02-09 NOTE — Patient Instructions (Signed)
Followup visit in one month with repeat blood work.

## 2013-02-09 NOTE — Progress Notes (Signed)
[facility] [facility] [phone]   [phone]  OFFICE PROGRESS NOTE  [name], MD [address] [place] [address]  DIAGNOSIS:  #1 non-Hodgkin's lymphoma diagnosed on bone marrow biopsy in June of 2012  #2 metastatic renal cell carcinoma initially diagnosed as stage III (T3a N0M0) renal cell carcinoma diagnosed in June of 2012 with pulmonary metastasis diagnosed in August of 2014.  PRIOR THERAPY:  1) Status post right nephrectomy under the care of Dr. Diona Fanti on 09/02/2010.  2) Systemic chemotherapy with CHOP/Rituxan given every 3 weeks, status post 6 cycles.   CURRENT THERAPY: Votrient 800 mg by mouth daily. Status post 5 months of treatment.  INTERVAL HISTORY: Melissa Cross 80 y.o. female returns to the clinic today for followup visit accompanied by her son. The patient is feeling fine today with no specific complaints except for mild fatigue and lack of appetite and feeling cold most of the time. She denied having any significant night sweats. The patient denied having any chest pain, shortness of breath, cough or hemoptysis. She has no nausea or vomiting. The patient denied having any fever or chills. She is tolerating her treatment with Votrient fairly well. She had repeat CT scan of the chest, abdomen and pelvis performed recently and she is here for evaluation and discussion of her scan results.  MEDICAL HISTORY: Past Medical History  Diagnosis Date  . Hypercholesteremia     takes Zocor daily  . Glaucoma   . Anemia   . Stroke     hx of TIA  . Blood transfusion   . Thrush 12/17/2010  . Renal cell carcinoma   . Non Hodgkin's lymphoma   . Cancer     kidney cancer, NHL   . Cancer     last chemo 11/04/10   . Hypertension     takes Nadolol daily  . Hypothyroidism     takes Synthroid daily  . Hypothyroid   . PONV (postoperative nausea and vomiting)   . Shortness of breath     with exertion  .  Headache(784.0)     hx of migraines  . TIA (transient ischemic attack)   . Peripheral neuropathy   . Arthritis     knee /hip  . Scoliosis   . History of bladder infections   . History of blood transfusion   . Cataract     right  . History of shingles     ALLERGIES:  is allergic to crab and dilaudid.  MEDICATIONS:  Current Outpatient Prescriptions  Medication Sig Dispense Refill  . amLODipine (NORVASC) 5 MG tablet Take 1 tablet (5 mg total) by mouth daily.  30 tablet  0  . aspirin EC 81 MG tablet Take 81 mg by mouth at bedtime.       . Calcium Citrate (CITRACAL PO) Take 1 tablet by mouth 3 (three) times a week.       . chlorhexidine (PERIDEX) 0.12 % solution       . cholecalciferol (VITAMIN D) 400 UNITS TABS Take 400 Units by mouth daily.        [name] levothyroxine (SYNTHROID, LEVOTHROID) 50 MCG tablet Take 50 mcg by mouth every morning.       [name] losartan-hydrochlorothiazide (HYZAAR) 50-12.5 MG per tablet Take 1 tablet by mouth daily.      . Multiple Vitamin (MULITIVITAMIN WITH MINERALS) TABS Take 1 tablet by mouth daily.        . nadolol (CORGARD)  40 MG tablet Take 40 mg by mouth at bedtime.       . pazopanib (VOTRIENT) 200 MG tablet Take 4 tablets (800 mg total) by mouth daily. Take on an empty stomach.  120 tablet  2  . sodium chloride (MURO 128) 5 % ophthalmic solution Place 1 drop into the left eye 3 (three) times daily.      . vitamin B-12 (CYANOCOBALAMIN) 1000 MCG tablet Place 1,000 mcg under the tongue daily.        No current facility-administered medications for this visit.   Facility-Administered Medications Ordered in Other Visits  Medication Dose Route Frequency Provider Last Rate Last Dose  . sodium chloride 0.9 % injection 10 mL  10 mL Intravenous PRN Curt Bears, MD   10 mL at 05/17/12 0846    SURGICAL HISTORY:  Past Surgical History  Procedure Laterality Date  . Nephrectomy      Right Side  . Portacath placement      right subclavian  . Tonsillectomy       as a child  . Mouth surgery    . Left cataract removed    . Video bronchoscopy with endobronchial ultrasound N/A 08/06/2012    Procedure: VIDEO BRONCHOSCOPY WITH ENDOBRONCHIAL ULTRASOUND;  Surgeon: Collene Gobble, MD;  Location: [facility];  Service: Thoracic;  Laterality: N/A;  . Video bronchoscopy with endobronchial navigation N/A 08/06/2012    Procedure: VIDEO BRONCHOSCOPY WITH ENDOBRONCHIAL NAVIGATION;  Surgeon: Collene Gobble, MD;  Location: [facility];  Service: Thoracic;  Laterality: N/A;    REVIEW OF SYSTEMS:  Constitutional: positive for fatigue Eyes: negative Ears, nose, mouth, throat, and face: negative Respiratory: negative Cardiovascular: negative Gastrointestinal: negative Genitourinary:negative Integument/breast: negative Hematologic/lymphatic: negative Musculoskeletal:negative Neurological: negative Behavioral/Psych: negative Endocrine: negative Allergic/Immunologic: negative   PHYSICAL EXAMINATION: General appearance: alert, cooperative, fatigued and no distress Head: Normocephalic, without obvious abnormality, atraumatic Neck: no adenopathy Lymph nodes: Cervical, supraclavicular, and axillary nodes normal. Resp: clear to auscultation bilaterally Cardio: regular rate and rhythm, S1, S2 normal, no murmur, click, rub or gallop GI: soft, non-tender; bowel sounds normal; no masses,  no organomegaly Extremities: extremities normal, atraumatic, no cyanosis or edema Neurologic: Alert and oriented X 3, normal strength and tone. Normal symmetric reflexes. Normal coordination and gait  ECOG PERFORMANCE STATUS: 1 - Symptomatic but completely ambulatory  Blood pressure 123/78, pulse 63, temperature 96.4 F (35.8 C), temperature source Oral, resp. rate 18, height $RemoveBe'5\' 2"'IONYRlmuc$  (1.575 m), weight 113 lb 11.2 oz (51.574 kg), SpO2 99.00%.  LABORATORY DATA: Lab Results  Component Value Date   WBC 4.4 02/07/2013   HGB 14.3 02/07/2013   HCT 40.1 02/07/2013   MCV 109.9* 02/07/2013   PLT 237 02/07/2013       Chemistry      Component Value Date/Time   NA 134* 02/07/2013 0807   NA 139 08/04/2012 0844   K 3.5 02/07/2013 0807   K 4.0 08/04/2012 0844   CL 101 08/04/2012 0844   CL 102 04/01/2012 0805   CO2 29 02/07/2013 0807   CO2 30 08/04/2012 0844   BUN 12.4 02/07/2013 0807   BUN 16 08/04/2012 0844   CREATININE 0.9 02/07/2013 0807   CREATININE 1.00 08/04/2012 0844      Component Value Date/Time   CALCIUM 9.9 02/07/2013 0807   CALCIUM 9.8 08/04/2012 0844   ALKPHOS 95 02/07/2013 0807   ALKPHOS 106 03/12/2011 0842   AST 25 02/07/2013 0807   AST 22 03/12/2011 0842   ALT 23 02/07/2013 4128  ALT 12 03/12/2011 0842   BILITOT 0.92 02/07/2013 0807   BILITOT 0.4 03/12/2011 0842       RADIOGRAPHIC STUDIES: Ct Chest Wo Contrast  02/07/2013   CLINICAL DATA:  History of non-Hodgkin's lymphoma and right-sided renal cell carcinoma. Lymphoma diagnosed in 2012. Renal cell carcinoma in 2012. Chemotherapy complete. Right nephrectomy. Shortness of breath.  EXAM: CT CHEST, ABDOMEN AND PELVIS WITHOUT CONTRAST  TECHNIQUE: Multidetector CT imaging of the chest, abdomen and pelvis was performed following the standard protocol without IV contrast.  COMPARISON:  CT ABD/PELV WO CM dated 11/09/2012  FINDINGS:   CT CHEST FINDINGS  Lungs/Pleura: Right middle lobe lung nodules. The more superior and lateral measures 1.3 cm on image 38 and is unchanged. The adjacent or contiguous more central on the inferior portion measures 1.0 cm and is similar on image 40.  Stable probable subpleural lymph lung along the posterior aspect of the right upper lobe on image 29 at 4 mm.  Minimal left upper lobe lung nodularity at 3 mm on image 17 is unchanged.  No pleural fluid.  Heart/Mediastinum: No supraclavicular adenopathy. Aortic and branch vessel atherosclerosis. Ascending aorta upper normal to mildly dilated at 4.1 cm. Felt to be similar to 3.9 cm on the prior.  Normal heart size with atherosclerosis in the coronary arteries. Trace pericardial fluid or  thickening anteriorly is unchanged. A mild pectus excavatum deformity.  A low right paratracheal/precarinal node measures 8 mm on image 26, unchanged. The more inferior portion of the node measures 1.1 cm on image 27, felt to be similar.  Hilar regions poorly evaluated without intravenous contrast.    CT ABDOMEN AND PELVIS FINDINGS  Abdomen/Pelvis: Well-circumscribed low-density right liver lobe lesion which is most consistent with a cyst, unchanged. Normal spleen, stomach, pancreas. Descending duodenal diverticulum.  Normal gallbladder, biliary tract, adrenal glands, left kidney. Status post right nephrectomy, without locally recurrent disease.  No retroperitoneal or retrocrural adenopathy. Colonic stool burden suggests constipation. Scattered colonic diverticula. Normal terminal ileum. Normal small bowel without abdominal ascites. No pelvic adenopathy. Normal urinary bladder. Left ovarian low-density focus measures 2.8 cm on image 85 and is unchanged. Redemonstrated central uterine hyper attenuating focus of 1.2 cm. No significant free fluid.  Bones/Musculoskeletal: Osteopenia.  Spondylosis.    IMPRESSION: CT CHEST IMPRESSION  1. Similar right middle lobe lung nodules. 2. Similar borderline mediastinal adenopathy. 3. No evidence of new or progressive disease.  CT ABDOMEN AND PELVIS IMPRESSION  1. Right nephrectomy without evidence of recurrent or metastatic disease. 2. Similar indeterminate low-density left ovarian lesion. 3. Similar hyper attenuating focus in the central uterus. Considerations remain submucosal fibroid or endometrial polyp. Recommend attention on follow-up.   Electronically Signed   By: Abigail Miyamoto M.D.   On: 02/07/2013 09:04   ASSESSMENT AND PLAN: This is a very pleasant 80 years old white female with metastatic renal cell carcinoma.  She is currently on treatment with Votrient 800 mg by mouth daily status post 5 months of treatment and tolerating it fairly well.  Her recent scan showed  no evidence for disease progression. I discussed the scan results with the patient and her son. I recommended for her to continue on treatment with Votrient with the same dose. For the lack of appetite and fatigue, I will start the patient on Medrol Dosepak. She would come back for followup visit in one month for reevaluation with repeat blood work. She was advised to call immediately if she has any concerning symptoms in the interval. The  patient voices understanding of current disease status and treatment options and is in agreement with the current care plan.  All questions were answered. The patient knows to call the clinic with any problems, questions or concerns. We can certainly see the patient much sooner if necessary.  Disclaimer: This note was dictated with voice recognition software. Similar sounding words can inadvertently be transcribed and may not be corrected upon review.

## 2013-02-09 NOTE — Telephone Encounter (Signed)
Gave pt appt for lab and md for march 2015

## 2013-02-18 ENCOUNTER — Telehealth: Payer: Self-pay | Admitting: Dietician

## 2013-02-18 NOTE — Telephone Encounter (Signed)
Brief Outpatient Oncology Nutrition Note  Patient has been identified to be at risk on malnutrition screen.  Wt Readings from Last 10 Encounters:  02/09/13 113 lb 11.2 oz (51.574 kg)  01/10/13 115 lb 1.6 oz (52.209 kg)  12/10/12 115 lb 12.8 oz (52.527 kg)  11/16/12 115 lb 6.4 oz (52.345 kg)  10/18/12 115 lb 12.8 oz (52.527 kg)  09/30/12 116 lb 3.2 oz (52.708 kg)  09/16/12 115 lb 14.4 oz (52.572 kg)  08/30/12 118 lb 4.8 oz (53.661 kg)  08/12/12 118 lb 14.4 oz (53.933 kg)  08/04/12 118 lb 9.6 oz (53.797 kg)    DIAGNOSIS:  #1 non-Hodgkin's lymphoma diagnosed on bone marrow biopsy in June of 2012  #2 metastatic renal cell carcinoma initially diagnosed as stage III (T3a N0M0) renal cell carcinoma diagnosed in June of 2012 with pulmonary metastasis diagnosed in August of 2014  Called patient due to weight loss.  Patient is unavailable.  Message left with [place] [address] contact information.  Antonieta Iba, RD, LDN

## 2013-02-25 IMAGING — US US SOFT TISSUE HEAD/NECK
1 series · 14 of 25 positions shown · non-contrast
Comparison: None.

CLINICAL DATA: Elevated TSH.  Question thyroid nodule.

THYROID ULTRASOUND
TECHNIQUE: Ultrasound examination of the thyroid gland and adjacent
soft tissues was performed.

[Series 1: us soft tissue head/neck · 0.07mm/px · 14 of 129 slices shown]
[im 1/129]
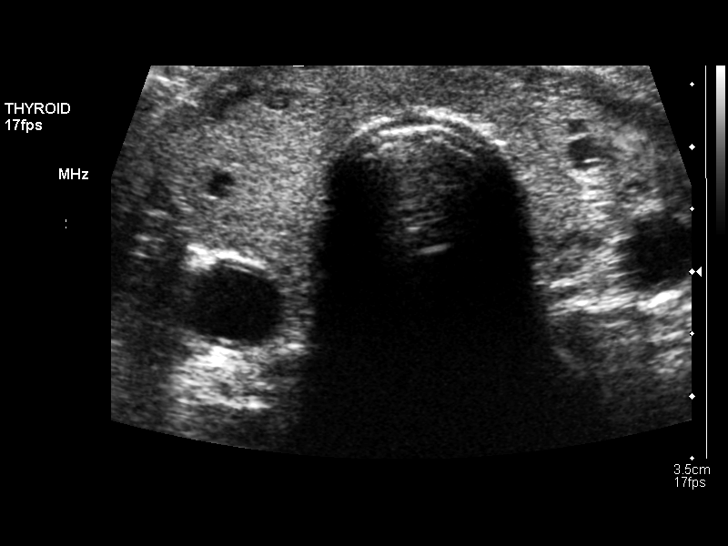
[im 11/129]
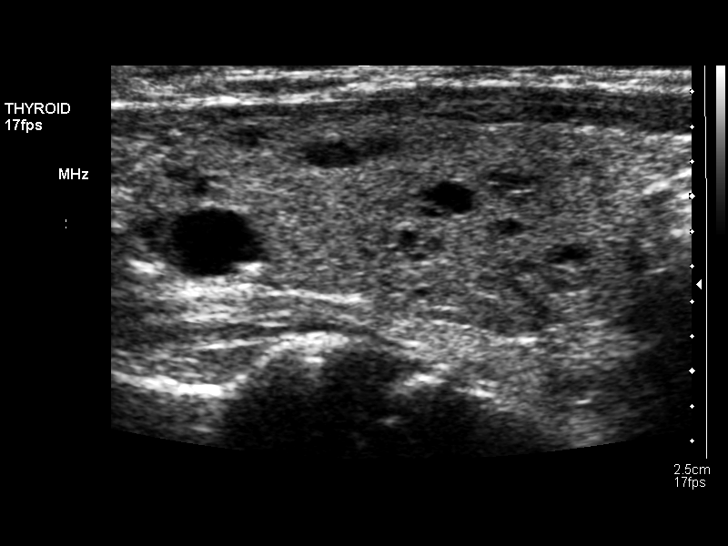
[im 22/129]
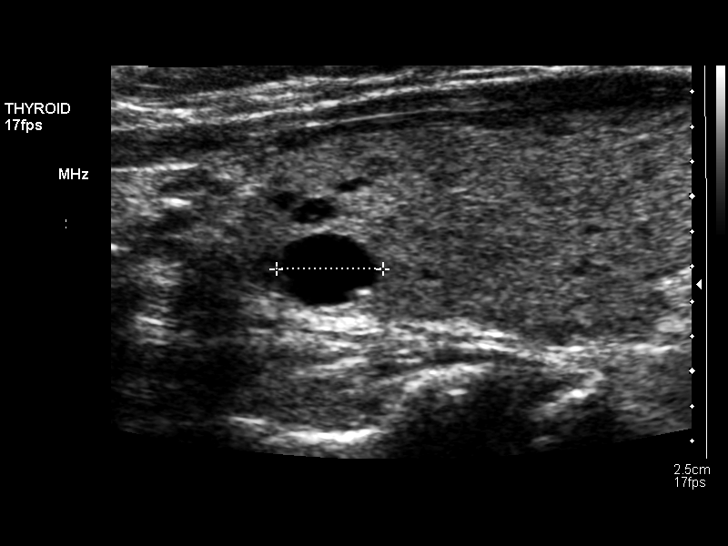
[im 33/129]
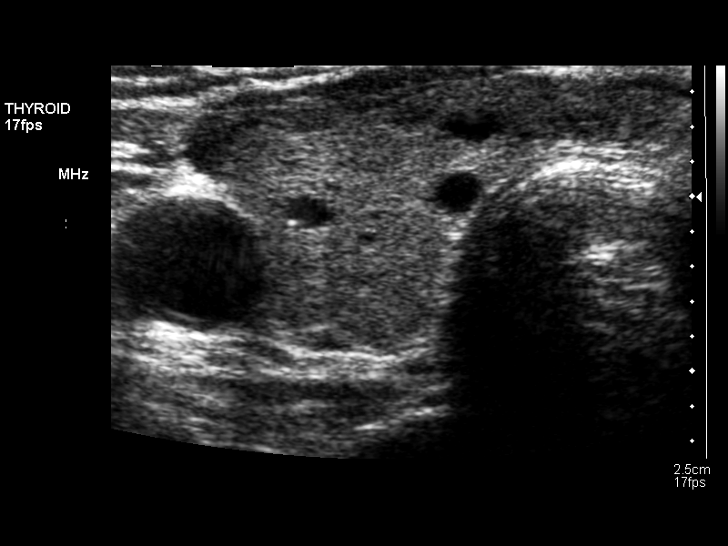
[im 43/129]
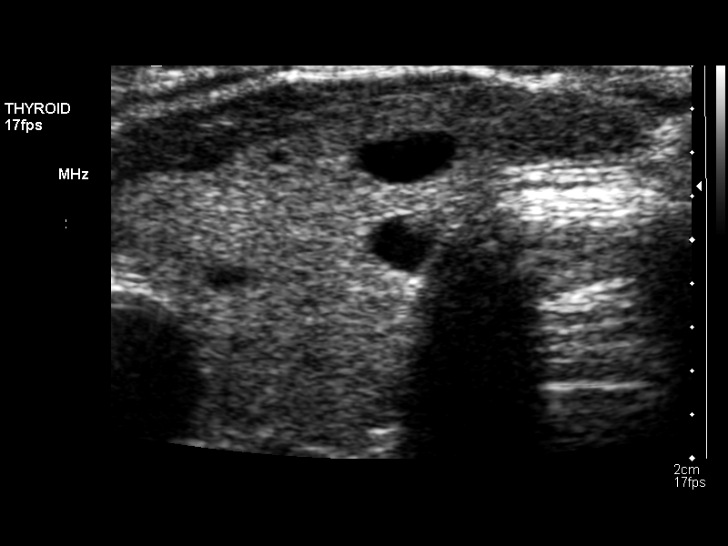
[im 49/129]
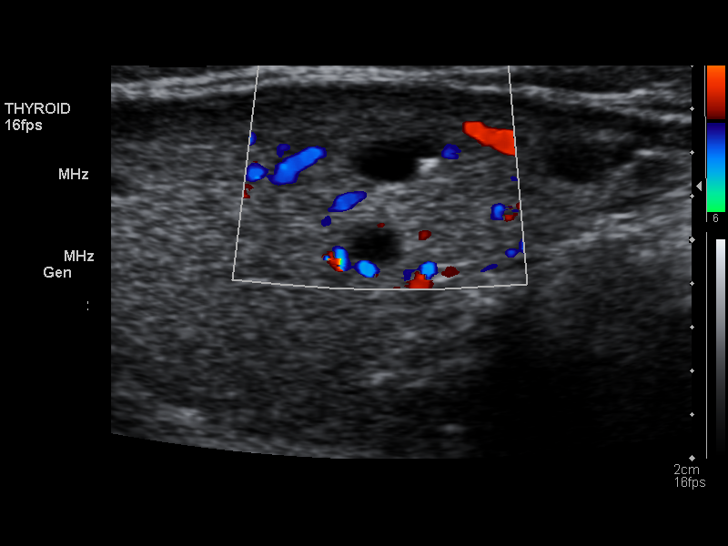
[im 59/129]
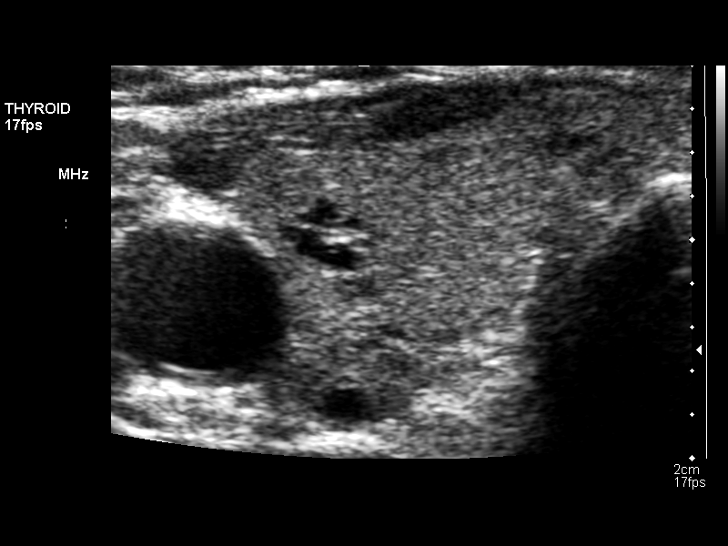
[im 70/129]
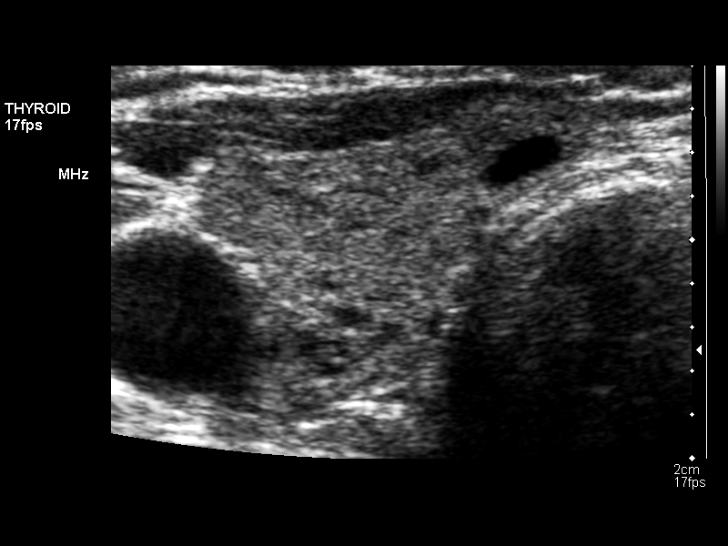
[im 81/129]
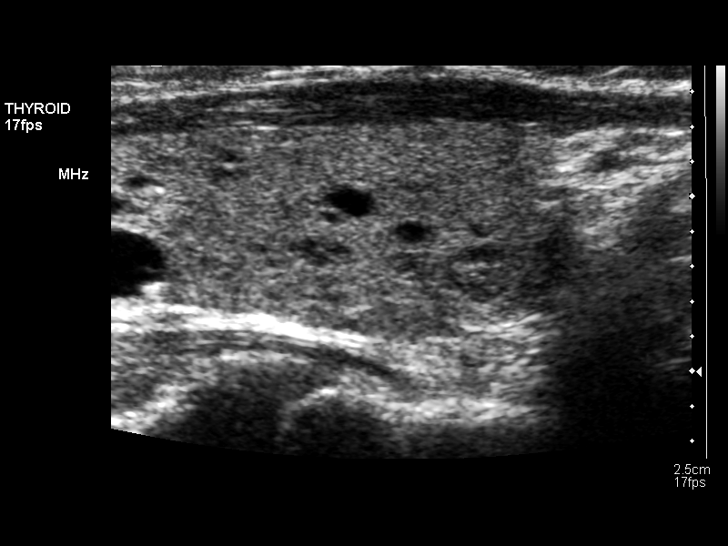
[im 86/129]
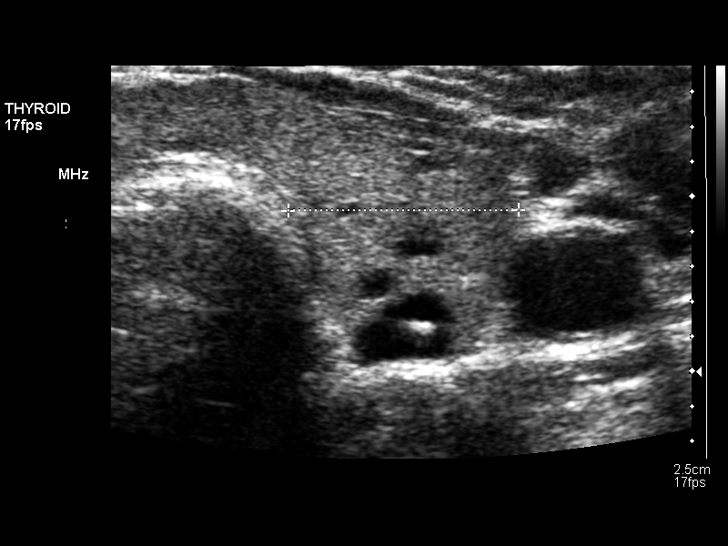
[im 97/129]
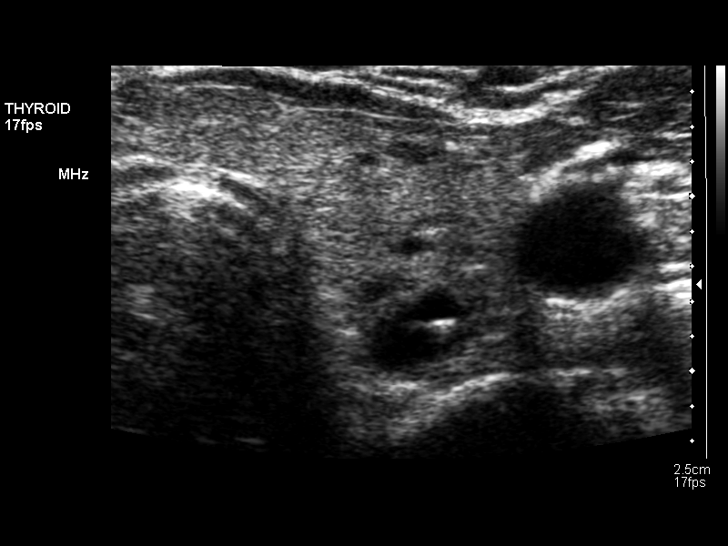
[im 107/129]
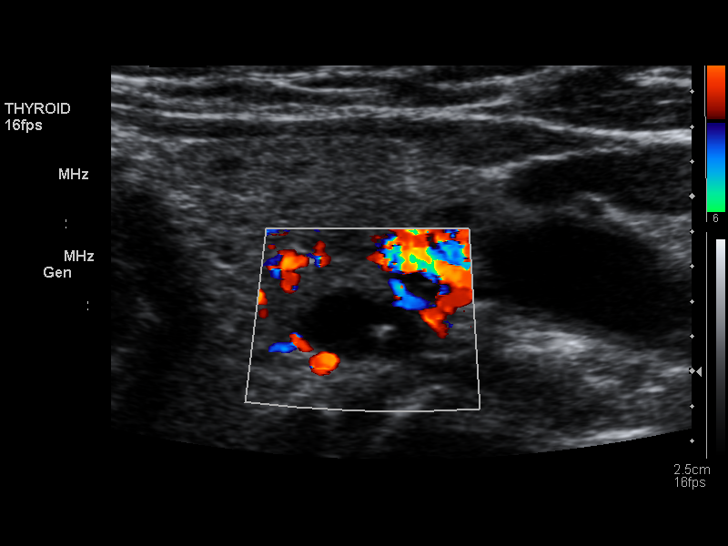
[im 118/129]
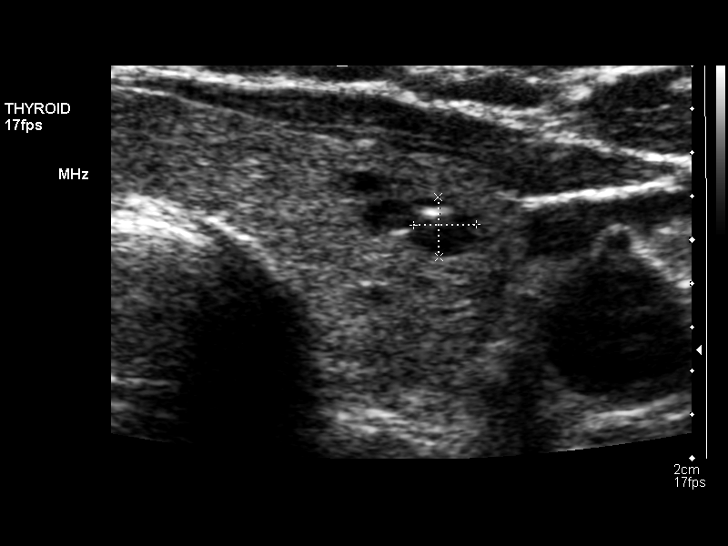
[im 129/129]
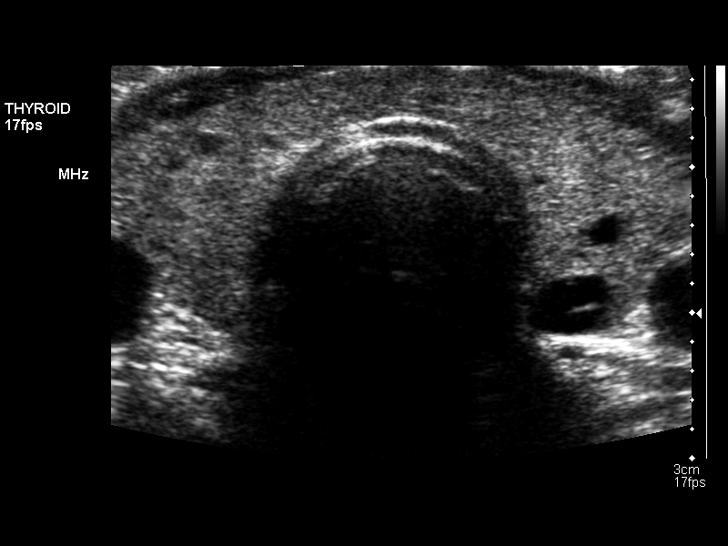

[14 of 25 positions shown; findings below may reference images not displayed]

FINDINGS: Right thyroid lobe:  Smaller in size measuring 3.3 cm long X 1.2 cm
AP X 1.4 cm wide.
Left thyroid lobe:  Smaller in size measuring 3.2 cm long X 1.5 cm
AP X 1.3 cm wide.
Isthmus:  Slightly thickened measuring 5 mm AP

Focal nodules:  Heterogeneous thyroid echotexture.

Multiple bilateral (approximately five right and three left)
subcentimeter cysts and slightly complex cysts.

Solitary inferior right lobe heterogeneous solid nodule measures 6
mm.

Lymphadenopathy:  None visualized.
IMPRESSION: 1.  Heterogeneous smaller sized thyroid consistent with chronic
thyroiditis.
2.  Multiple bilateral benign appearing subcentimeter cysts with
solitary 6 mm inferior right lobe probably benign solid nodule.

## 2013-03-02 ENCOUNTER — Ambulatory Visit: Payer: Medicare Other | Admitting: Oncology

## 2013-03-02 ENCOUNTER — Other Ambulatory Visit: Payer: Medicare Other

## 2013-03-09 ENCOUNTER — Ambulatory Visit (HOSPITAL_BASED_OUTPATIENT_CLINIC_OR_DEPARTMENT_OTHER): Payer: Medicare Other

## 2013-03-09 ENCOUNTER — Telehealth: Payer: Self-pay | Admitting: Internal Medicine

## 2013-03-09 ENCOUNTER — Other Ambulatory Visit (HOSPITAL_BASED_OUTPATIENT_CLINIC_OR_DEPARTMENT_OTHER): Payer: Medicare Other

## 2013-03-09 ENCOUNTER — Ambulatory Visit (HOSPITAL_BASED_OUTPATIENT_CLINIC_OR_DEPARTMENT_OTHER): Payer: Medicare Other | Admitting: Physician Assistant

## 2013-03-09 ENCOUNTER — Encounter: Payer: Self-pay | Admitting: Physician Assistant

## 2013-03-09 VITALS — Ht 62.0 in | Wt 113.6 lb

## 2013-03-09 VITALS — BP 126/64 | HR 66 | Temp 97.4°F

## 2013-03-09 DIAGNOSIS — C649 Malignant neoplasm of unspecified kidney, except renal pelvis: Secondary | ICD-10-CM

## 2013-03-09 DIAGNOSIS — C7800 Secondary malignant neoplasm of unspecified lung: Secondary | ICD-10-CM

## 2013-03-09 DIAGNOSIS — C8589 Other specified types of non-Hodgkin lymphoma, extranodal and solid organ sites: Secondary | ICD-10-CM | POA: Diagnosis not present

## 2013-03-09 DIAGNOSIS — R5381 Other malaise: Secondary | ICD-10-CM

## 2013-03-09 DIAGNOSIS — Z452 Encounter for adjustment and management of vascular access device: Secondary | ICD-10-CM | POA: Diagnosis not present

## 2013-03-09 DIAGNOSIS — R630 Anorexia: Secondary | ICD-10-CM

## 2013-03-09 DIAGNOSIS — R5383 Other fatigue: Secondary | ICD-10-CM

## 2013-03-09 DIAGNOSIS — R634 Abnormal weight loss: Secondary | ICD-10-CM | POA: Diagnosis not present

## 2013-03-09 DIAGNOSIS — C8590 Non-Hodgkin lymphoma, unspecified, unspecified site: Secondary | ICD-10-CM

## 2013-03-09 DIAGNOSIS — Z95828 Presence of other vascular implants and grafts: Secondary | ICD-10-CM

## 2013-03-09 LAB — CBC WITH DIFFERENTIAL/PLATELET
BASO%: 0.7 % (ref 0.0–2.0)
BASOS ABS: 0 10*3/uL (ref 0.0–0.1)
EOS ABS: 0.1 10*3/uL (ref 0.0–0.5)
EOS%: 2 % (ref 0.0–7.0)
HEMATOCRIT: 38.1 % (ref 34.8–46.6)
HEMOGLOBIN: 13.5 g/dL (ref 11.6–15.9)
LYMPH#: 0.7 10*3/uL — AB (ref 0.9–3.3)
LYMPH%: 13.4 % — ABNORMAL LOW (ref 14.0–49.7)
MCH: 39.1 pg — ABNORMAL HIGH (ref 25.1–34.0)
MCHC: 35.5 g/dL (ref 31.5–36.0)
MCV: 110.1 fL — ABNORMAL HIGH (ref 79.5–101.0)
MONO#: 0.3 10*3/uL (ref 0.1–0.9)
MONO%: 6.6 % (ref 0.0–14.0)
NEUT%: 77.3 % — ABNORMAL HIGH (ref 38.4–76.8)
NEUTROS ABS: 3.9 10*3/uL (ref 1.5–6.5)
Platelets: 207 10*3/uL (ref 145–400)
RBC: 3.46 10*6/uL — ABNORMAL LOW (ref 3.70–5.45)
RDW: 13.7 % (ref 11.2–14.5)
WBC: 5.1 10*3/uL (ref 3.9–10.3)

## 2013-03-09 LAB — COMPREHENSIVE METABOLIC PANEL (CC13)
ALBUMIN: 3.5 g/dL (ref 3.5–5.0)
ALT: 23 U/L (ref 0–55)
ANION GAP: 14 meq/L — AB (ref 3–11)
AST: 20 U/L (ref 5–34)
Alkaline Phosphatase: 92 U/L (ref 40–150)
BUN: 13.1 mg/dL (ref 7.0–26.0)
CHLORIDE: 92 meq/L — AB (ref 98–109)
CO2: 26 meq/L (ref 22–29)
Calcium: 9.5 mg/dL (ref 8.4–10.4)
Creatinine: 0.9 mg/dL (ref 0.6–1.1)
GLUCOSE: 140 mg/dL (ref 70–140)
POTASSIUM: 3.8 meq/L (ref 3.5–5.1)
Sodium: 131 mEq/L — ABNORMAL LOW (ref 136–145)
Total Bilirubin: 0.77 mg/dL (ref 0.20–1.20)
Total Protein: 5.9 g/dL — ABNORMAL LOW (ref 6.4–8.3)

## 2013-03-09 LAB — LACTATE DEHYDROGENASE (CC13): LDH: 192 U/L (ref 125–245)

## 2013-03-09 MED ORDER — SODIUM CHLORIDE 0.9 % IJ SOLN
10.0000 mL | INTRAMUSCULAR | Status: DC | PRN
  Administered 2013-03-09: 10 mL via INTRAVENOUS
  Filled 2013-03-09: qty 10

## 2013-03-09 MED ORDER — HEPARIN SOD (PORK) LOCK FLUSH 100 UNIT/ML IV SOLN
500.0000 [IU] | Freq: Once | INTRAVENOUS | Status: AC
  Administered 2013-03-09: 500 [IU] via INTRAVENOUS
  Filled 2013-03-09: qty 5

## 2013-03-09 NOTE — Progress Notes (Signed)
[place] Telephone:([phone]) [phone]   Fax:(336) [phone]  SHARED VISIT PROGRESS NOTE  Melissa Coma, MD [address] [place] [address]  DIAGNOSIS:  #1 non-Hodgkin's lymphoma diagnosed on bone marrow biopsy in June of 2012  #2 metastatic renal cell carcinoma initially diagnosed as stage III (T3a N0M0) renal cell carcinoma diagnosed in June of 2012 with pulmonary metastasis diagnosed in August of 2014.  PRIOR THERAPY:  1) Status post right nephrectomy under the care of Dr. Diona Fanti on 09/02/2010.  2) Systemic chemotherapy with CHOP/Rituxan given every 3 weeks, status post 6 cycles.   CURRENT THERAPY: Votrient 800 mg by mouth daily. Status post 6 months of treatment.  INTERVAL HISTORY: Melissa Cross 80 y.o. female returns to the clinic today for followup visit accompanied by her son. The patient is feeling fine today with no specific complaints except for mild fatigue and lack of appetite and feeling cold most of the time. She was given a Medrol Dosepak to help stimulate her appetite. She states that at day 5 of the prednisone taper she had itching that lasted for about a day and a half. Itching only involved her arms and on her abdomen. She states it spontaneously stopped. She also had a significant increase in her fatigue as well as weakness while she was on the steroid taper. She does lately she feels as if she has more bad days than good days. She denied having any significant night sweats. The patient denied having any chest pain, shortness of breath, cough or hemoptysis. She has no nausea or vomiting. The patient denied having any fever or chills. She is tolerating her treatment with Votrient fairly well.   MEDICAL HISTORY: Past Medical History  Diagnosis Date  . Hypercholesteremia     takes Zocor daily  . Glaucoma   . Anemia   . Stroke     hx of TIA  . Blood transfusion   . Thrush 12/17/2010  . Renal cell carcinoma   . Non  Hodgkin's lymphoma   . Cancer     kidney cancer, NHL   . Cancer     last chemo 11/04/10   . Hypertension     takes Nadolol daily  . Hypothyroidism     takes Synthroid daily  . Hypothyroid   . PONV (postoperative nausea and vomiting)   . Shortness of breath     with exertion  . Headache(784.0)     hx of migraines  . TIA (transient ischemic attack)   . Peripheral neuropathy   . Arthritis     knee /hip  . Scoliosis   . History of bladder infections   . History of blood transfusion   . Cataract     right  . History of shingles     ALLERGIES:  is allergic to crab and dilaudid.  MEDICATIONS:  Current Outpatient Prescriptions  Medication Sig Dispense Refill  . amLODipine (NORVASC) 5 MG tablet Take 1 tablet (5 mg total) by mouth daily.  30 tablet  0  . aspirin EC 81 MG tablet Take 81 mg by mouth at bedtime.       . Calcium Citrate (CITRACAL PO) Take 1 tablet by mouth 3 (three) times a week.       . chlorhexidine (PERIDEX) 0.12 % solution       . cholecalciferol (VITAMIN D) 400 UNITS TABS Take 400 Units by mouth daily.        [name] levothyroxine (  SYNTHROID, LEVOTHROID) 50 MCG tablet Take 50 mcg by mouth every morning.       [name] losartan-hydrochlorothiazide (HYZAAR) 50-12.5 MG per tablet Take 1 tablet by mouth daily.      . Multiple Vitamin (MULITIVITAMIN WITH MINERALS) TABS Take 1 tablet by mouth daily.        . nadolol (CORGARD) 40 MG tablet Take 40 mg by mouth at bedtime.       . pazopanib (VOTRIENT) 200 MG tablet Take 4 tablets (800 mg total) by mouth daily. Take on an empty stomach.  120 tablet  2  . sodium chloride (MURO 128) 5 % ophthalmic solution Place 1 drop into the left eye 3 (three) times daily.      . vitamin B-12 (CYANOCOBALAMIN) 1000 MCG tablet Place 1,000 mcg under the tongue daily.       . methylPREDNIsolone (MEDROL DOSPACK) 4 MG tablet follow package directions  21 tablet  0   No current facility-administered medications for this visit.   Facility-Administered  Medications Ordered in Other Visits  Medication Dose Route Frequency Provider Last Rate Last Dose  . sodium chloride 0.9 % injection 10 mL  10 mL Intravenous PRN Curt Bears, MD   10 mL at 05/17/12 0846    SURGICAL HISTORY:  Past Surgical History  Procedure Laterality Date  . Nephrectomy      Right Side  . Portacath placement      right subclavian  . Tonsillectomy      as a child  . Mouth surgery    . Left cataract removed    . Video bronchoscopy with endobronchial ultrasound N/A 08/06/2012    Procedure: VIDEO BRONCHOSCOPY WITH ENDOBRONCHIAL ULTRASOUND;  Surgeon: Collene Gobble, MD;  Location: [facility];  Service: Thoracic;  Laterality: N/A;  . Video bronchoscopy with endobronchial navigation N/A 08/06/2012    Procedure: VIDEO BRONCHOSCOPY WITH ENDOBRONCHIAL NAVIGATION;  Surgeon: Collene Gobble, MD;  Location: [facility];  Service: Thoracic;  Laterality: N/A;    REVIEW OF SYSTEMS:  Constitutional: positive for anorexia and fatigue Eyes: negative Ears, nose, mouth, throat, and face: negative Respiratory: negative Cardiovascular: negative Gastrointestinal: negative Genitourinary:negative Integument/breast: negative Hematologic/lymphatic: negative Musculoskeletal:negative Neurological: negative Behavioral/Psych: negative Endocrine: negative Allergic/Immunologic: negative   PHYSICAL EXAMINATION: General appearance: alert, cooperative, fatigued and no distress Head: Normocephalic, without obvious abnormality, atraumatic Neck: no adenopathy Lymph nodes: Cervical, supraclavicular, and axillary nodes normal. Resp: clear to auscultation bilaterally Cardio: regular rate and rhythm, S1, S2 normal, no murmur, click, rub or gallop GI: soft, non-tender; bowel sounds normal; no masses,  no organomegaly Extremities: extremities normal, atraumatic, no cyanosis or edema Neurologic: Alert and oriented X 3, normal strength and tone. Normal symmetric reflexes. Normal coordination and gait  ECOG  PERFORMANCE STATUS: 1 - Symptomatic but completely ambulatory  Blood pressure , height _0  (1.575 m), weight 113 lb 9.6 oz (51.529 kg).  LABORATORY DATA: Lab Results  Component Value Date   WBC 5.1 03/09/2013   HGB 13.5 03/09/2013   HCT 38.1 03/09/2013   MCV 110.1* 03/09/2013   PLT 207 03/09/2013      Chemistry      Component Value Date/Time   NA 131* 03/09/2013 0906   NA 139 08/04/2012 0844   K 3.8 03/09/2013 0906   K 4.0 08/04/2012 0844   CL 101 08/04/2012 0844   CL 102 04/01/2012 0805   CO2 26 03/09/2013 0906   CO2 30 08/04/2012 0844   BUN 13.1 03/09/2013 0906   BUN 16 08/04/2012 0844  CREATININE 0.9 03/09/2013 0906   CREATININE 1.00 08/04/2012 0844      Component Value Date/Time   CALCIUM 9.5 03/09/2013 0906   CALCIUM 9.8 08/04/2012 0844   ALKPHOS 92 03/09/2013 0906   ALKPHOS 106 03/12/2011 0842   AST 20 03/09/2013 0906   AST 22 03/12/2011 0842   ALT 23 03/09/2013 0906   ALT 12 03/12/2011 0842   BILITOT 0.77 03/09/2013 0906   BILITOT 0.4 03/12/2011 0842       RADIOGRAPHIC STUDIES: Ct Chest Wo Contrast  02/07/2013   CLINICAL DATA:  History of non-Hodgkin's lymphoma and right-sided renal cell carcinoma. Lymphoma diagnosed in 2012. Renal cell carcinoma in 2012. Chemotherapy complete. Right nephrectomy. Shortness of breath.  EXAM: CT CHEST, ABDOMEN AND PELVIS WITHOUT CONTRAST  TECHNIQUE: Multidetector CT imaging of the chest, abdomen and pelvis was performed following the standard protocol without IV contrast.  COMPARISON:  CT ABD/PELV WO CM dated 11/09/2012  FINDINGS:   CT CHEST FINDINGS  Lungs/Pleura: Right middle lobe lung nodules. The more superior and lateral measures 1.3 cm on image 38 and is unchanged. The adjacent or contiguous more central on the inferior portion measures 1.0 cm and is similar on image 40.  Stable probable subpleural lymph lung along the posterior aspect of the right upper lobe on image 29 at 4 mm.  Minimal left upper lobe lung nodularity at 3 mm on image 17 is unchanged.  No pleural  fluid.  Heart/Mediastinum: No supraclavicular adenopathy. Aortic and branch vessel atherosclerosis. Ascending aorta upper normal to mildly dilated at 4.1 cm. Felt to be similar to 3.9 cm on the prior.  Normal heart size with atherosclerosis in the coronary arteries. Trace pericardial fluid or thickening anteriorly is unchanged. A mild pectus excavatum deformity.  A low right paratracheal/precarinal node measures 8 mm on image 26, unchanged. The more inferior portion of the node measures 1.1 cm on image 27, felt to be similar.  Hilar regions poorly evaluated without intravenous contrast.    CT ABDOMEN AND PELVIS FINDINGS  Abdomen/Pelvis: Well-circumscribed low-density right liver lobe lesion which is most consistent with a cyst, unchanged. Normal spleen, stomach, pancreas. Descending duodenal diverticulum.  Normal gallbladder, biliary tract, adrenal glands, left kidney. Status post right nephrectomy, without locally recurrent disease.  No retroperitoneal or retrocrural adenopathy. Colonic stool burden suggests constipation. Scattered colonic diverticula. Normal terminal ileum. Normal small bowel without abdominal ascites. No pelvic adenopathy. Normal urinary bladder. Left ovarian low-density focus measures 2.8 cm on image 85 and is unchanged. Redemonstrated central uterine hyper attenuating focus of 1.2 cm. No significant free fluid.  Bones/Musculoskeletal: Osteopenia.  Spondylosis.    IMPRESSION: CT CHEST IMPRESSION  1. Similar right middle lobe lung nodules. 2. Similar borderline mediastinal adenopathy. 3. No evidence of new or progressive disease.  CT ABDOMEN AND PELVIS IMPRESSION  1. Right nephrectomy without evidence of recurrent or metastatic disease. 2. Similar indeterminate low-density left ovarian lesion. 3. Similar hyper attenuating focus in the central uterus. Considerations remain submucosal fibroid or endometrial polyp. Recommend attention on follow-up.   Electronically Signed   By: Abigail Miyamoto M.D.    On: 02/07/2013 09:04   ASSESSMENT AND PLAN: This is a very pleasant 80 years old white female with metastatic renal cell carcinoma.  She is currently on treatment with Votrient 800 mg by mouth daily status post 6 months of treatment and tolerating it fairly well.  Her recent scan showed no evidence for disease progression. Patient was discussed with an also seen by Dr. Julien Nordmann.  She will continue on with the transplant at the current dose. She will return for a followup visit in one month for reevaluation with repeat blood work. She was advised to call immediately if she has any concerning symptoms in the interval. The patient voices understanding of current disease status and treatment options and is in agreement with the current care plan.  All questions were answered. The patient knows to call the clinic with any problems, questions or concerns. We can certainly see the patient much sooner if necessary.  Melissa Adam, PA-C  ADDENDUM: Hematology/Oncology:  I had the face to face encounter with the patient. I recommended her care plan. This is a very pleasant 80 years old white female with metastatic renal cell carcinoma currently undergoing treatment with Votrient 800 mg by mouth daily and tolerating her treatment fairly well. The patient was recently started on Medrol Dosepak for lack of appetite and weight loss but she has significant fatigue and weakness as well as itching after this treatment. She was advised to use Benadryl if needed for itching.  She will continue her current treatment with Votrient as scheduled. She would come back for followup visit in one month's for reevaluation. She was advised to call immediately if she has any concerning symptoms in the interval.  Disclaimer: This note was dictated with voice recognition software. Similar sounding words can inadvertently be transcribed and may not be corrected upon review. Eilleen Kempf., MD 03/12/2013

## 2013-03-09 NOTE — Telephone Encounter (Signed)
gv and printed appt sched and avs for pt for April  °

## 2013-03-10 NOTE — Patient Instructions (Signed)
Continue taking your present at the current dose Followup in one month for another symptom management visit

## 2013-03-29 ENCOUNTER — Telehealth: Payer: Self-pay | Admitting: Dietician

## 2013-03-29 NOTE — Telephone Encounter (Signed)
Brief Outpatient Oncology Nutrition Note  Patient has been identified to be at risk on malnutrition screen.  Wt Readings from Last 10 Encounters:  03/09/13 113 lb 9.6 oz (51.529 kg)  02/09/13 113 lb 11.2 oz (51.574 kg)  01/10/13 115 lb 1.6 oz (52.209 kg)  12/10/12 115 lb 12.8 oz (52.527 kg)  11/16/12 115 lb 6.4 oz (52.345 kg)  10/18/12 115 lb 12.8 oz (52.527 kg)  09/30/12 116 lb 3.2 oz (52.708 kg)  09/16/12 115 lb 14.4 oz (52.572 kg)  08/30/12 118 lb 4.8 oz (53.661 kg)  08/12/12 118 lb 14.4 oz (53.933 kg)    DIAGNOSIS:  #1 non-Hodgkin's lymphoma diagnosed on bone marrow biopsy in June of 2012  #2 metastatic renal cell carcinoma initially diagnosed as stage III (T3a N0M0) renal cell carcinoma diagnosed in June of 2012 with pulmonary metastasis diagnosed in August of 2014  Called patient who reports early satiety and taste alterations which are limiting intake.  Drinks Ensure or Boost at times and making her own smoothies but does not like them.  Discussed using [employer] as a base for smoothies.  Recommended frequent small meals/supplements and gave tips for taste alterations.  Will mail coupons for the instant breakfast and tips for taste changes and increasing calories and protein along with the contact information for the [facility] [facility] [address].  Antonieta Iba, RD, LDN

## 2013-04-06 ENCOUNTER — Telehealth: Payer: Self-pay | Admitting: Internal Medicine

## 2013-04-06 ENCOUNTER — Encounter: Payer: Self-pay | Admitting: Internal Medicine

## 2013-04-06 ENCOUNTER — Other Ambulatory Visit (HOSPITAL_BASED_OUTPATIENT_CLINIC_OR_DEPARTMENT_OTHER): Payer: Medicare Other

## 2013-04-06 ENCOUNTER — Ambulatory Visit (HOSPITAL_BASED_OUTPATIENT_CLINIC_OR_DEPARTMENT_OTHER): Payer: Medicare Other | Admitting: Internal Medicine

## 2013-04-06 VITALS — BP 152/78 | HR 64 | Temp 97.6°F | Resp 18 | Ht 62.0 in | Wt 114.0 lb

## 2013-04-06 DIAGNOSIS — C7800 Secondary malignant neoplasm of unspecified lung: Secondary | ICD-10-CM

## 2013-04-06 DIAGNOSIS — C649 Malignant neoplasm of unspecified kidney, except renal pelvis: Secondary | ICD-10-CM

## 2013-04-06 DIAGNOSIS — R5381 Other malaise: Secondary | ICD-10-CM | POA: Diagnosis not present

## 2013-04-06 DIAGNOSIS — R5383 Other fatigue: Secondary | ICD-10-CM

## 2013-04-06 DIAGNOSIS — C8589 Other specified types of non-Hodgkin lymphoma, extranodal and solid organ sites: Secondary | ICD-10-CM

## 2013-04-06 DIAGNOSIS — C8590 Non-Hodgkin lymphoma, unspecified, unspecified site: Secondary | ICD-10-CM

## 2013-04-06 LAB — CBC WITH DIFFERENTIAL/PLATELET
BASO%: 0.5 % (ref 0.0–2.0)
Basophils Absolute: 0 10*3/uL (ref 0.0–0.1)
EOS%: 3.2 % (ref 0.0–7.0)
Eosinophils Absolute: 0.1 10*3/uL (ref 0.0–0.5)
HEMATOCRIT: 38.6 % (ref 34.8–46.6)
HGB: 13.5 g/dL (ref 11.6–15.9)
LYMPH%: 14.1 % (ref 14.0–49.7)
MCH: 38.6 pg — ABNORMAL HIGH (ref 25.1–34.0)
MCHC: 35 g/dL (ref 31.5–36.0)
MCV: 110.2 fL — ABNORMAL HIGH (ref 79.5–101.0)
MONO#: 0.4 10*3/uL (ref 0.1–0.9)
MONO%: 9 % (ref 0.0–14.0)
NEUT#: 3 10*3/uL (ref 1.5–6.5)
NEUT%: 73.2 % (ref 38.4–76.8)
Platelets: 244 10*3/uL (ref 145–400)
RBC: 3.5 10*6/uL — AB (ref 3.70–5.45)
RDW: 14 % (ref 11.2–14.5)
WBC: 4.2 10*3/uL (ref 3.9–10.3)
lymph#: 0.6 10*3/uL — ABNORMAL LOW (ref 0.9–3.3)

## 2013-04-06 LAB — COMPREHENSIVE METABOLIC PANEL (CC13)
ALT: 18 U/L (ref 0–55)
AST: 21 U/L (ref 5–34)
Albumin: 3.9 g/dL (ref 3.5–5.0)
Alkaline Phosphatase: 90 U/L (ref 40–150)
Anion Gap: 9 meq/L (ref 3–11)
BUN: 11.8 mg/dL (ref 7.0–26.0)
CO2: 30 meq/L — ABNORMAL HIGH (ref 22–29)
Calcium: 9.5 mg/dL (ref 8.4–10.4)
Chloride: 93 meq/L — ABNORMAL LOW (ref 98–109)
Creatinine: 0.8 mg/dL (ref 0.6–1.1)
Glucose: 87 mg/dL (ref 70–140)
Potassium: 3.4 meq/L — ABNORMAL LOW (ref 3.5–5.1)
Sodium: 132 meq/L — ABNORMAL LOW (ref 136–145)
Total Bilirubin: 0.72 mg/dL (ref 0.20–1.20)
Total Protein: 6.5 g/dL (ref 6.4–8.3)

## 2013-04-06 LAB — LACTATE DEHYDROGENASE (CC13): LDH: 204 U/L (ref 125–245)

## 2013-04-06 NOTE — Telephone Encounter (Signed)
gv adn printed aptp sched and avs for opt for April and May....sed  gv pt barium

## 2013-04-06 NOTE — Progress Notes (Signed)
[facility] Telephone:(336) [phone]   Fax:(336) [phone]  OFFICE PROGRESS NOTE  [name], MD [address] [place] [address]  DIAGNOSIS:  #1 non-Hodgkin's lymphoma diagnosed on bone marrow biopsy in June of 2012  #2 metastatic renal cell carcinoma initially diagnosed as stage III (T3a N0M0) renal cell carcinoma diagnosed in June of 2012 with pulmonary metastasis diagnosed in August of 2014.  PRIOR THERAPY:  1) Status post right nephrectomy under the care of Dr. Diona Fanti on 09/02/2010.  2) Systemic chemotherapy with CHOP/Rituxan given every 3 weeks, status post 6 cycles.   CURRENT THERAPY: Votrient 800 mg by mouth daily. Status post 7 months of treatment.  INTERVAL HISTORY: Melissa Cross 80 y.o. female returns to the clinic today for followup visit accompanied by her son. The patient is feeling fine today with no specific complaints except for mild fatigue. She is able to maintain her weight stable. She denied having any significant night sweats. The patient denied having any chest pain, shortness of breath, cough or hemoptysis. She has no nausea or vomiting. The patient denied having any fever or chills. She is tolerating her treatment with Votrient fairly well.   MEDICAL HISTORY: Past Medical History  Diagnosis Date  . Hypercholesteremia     takes Zocor daily  . Glaucoma   . Anemia   . Stroke     hx of TIA  . Blood transfusion   . Thrush 12/17/2010  . Renal cell carcinoma   . Non Hodgkin's lymphoma   . Cancer     kidney cancer, NHL   . Cancer     last chemo 11/04/10   . Hypertension     takes Nadolol daily  . Hypothyroidism     takes Synthroid daily  . Hypothyroid   . PONV (postoperative nausea and vomiting)   . Shortness of breath     with exertion  . Headache(784.0)     hx of migraines  . TIA (transient ischemic attack)   . Peripheral neuropathy   . Arthritis     knee /hip  . Scoliosis   . History of  bladder infections   . History of blood transfusion   . Cataract     right  . History of shingles     ALLERGIES:  is allergic to crab and dilaudid.  MEDICATIONS:  Current Outpatient Prescriptions  Medication Sig Dispense Refill  . amLODipine (NORVASC) 5 MG tablet Take 1 tablet (5 mg total) by mouth daily.  30 tablet  0  . aspirin EC 81 MG tablet Take 81 mg by mouth at bedtime.       . Calcium Citrate (CITRACAL PO) Take 1 tablet by mouth 3 (three) times a week.       . chlorhexidine (PERIDEX) 0.12 % solution       . cholecalciferol (VITAMIN D) 400 UNITS TABS Take 400 Units by mouth daily.        [name] levothyroxine (SYNTHROID, LEVOTHROID) 50 MCG tablet Take 50 mcg by mouth every morning.       [name] losartan-hydrochlorothiazide (HYZAAR) 50-12.5 MG per tablet Take 1 tablet by mouth daily.      . Multiple Vitamin (MULITIVITAMIN WITH MINERALS) TABS Take 1 tablet by mouth daily.        . nadolol (CORGARD) 40 MG tablet Take 40 mg by mouth at bedtime.       . pazopanib (VOTRIENT) 200 MG tablet Take 4 tablets (800 mg  total) by mouth daily. Take on an empty stomach.  120 tablet  2  . sodium chloride (MURO 128) 5 % ophthalmic solution Place 1 drop into the left eye 3 (three) times daily.      . vitamin B-12 (CYANOCOBALAMIN) 1000 MCG tablet Place 1,000 mcg under the tongue daily.        No current facility-administered medications for this visit.   Facility-Administered Medications Ordered in Other Visits  Medication Dose Route Frequency Provider Last Rate Last Dose  . sodium chloride 0.9 % injection 10 mL  10 mL Intravenous PRN Curt Bears, MD   10 mL at 05/17/12 0846    SURGICAL HISTORY:  Past Surgical History  Procedure Laterality Date  . Nephrectomy      Right Side  . Portacath placement      right subclavian  . Tonsillectomy      as a child  . Mouth surgery    . Left cataract removed    . Video bronchoscopy with endobronchial ultrasound N/A 08/06/2012    Procedure: VIDEO BRONCHOSCOPY  WITH ENDOBRONCHIAL ULTRASOUND;  Surgeon: Collene Gobble, MD;  Location: [facility];  Service: Thoracic;  Laterality: N/A;  . Video bronchoscopy with endobronchial navigation N/A 08/06/2012    Procedure: VIDEO BRONCHOSCOPY WITH ENDOBRONCHIAL NAVIGATION;  Surgeon: Collene Gobble, MD;  Location: [facility];  Service: Thoracic;  Laterality: N/A;    REVIEW OF SYSTEMS:  Constitutional: positive for fatigue Eyes: negative Ears, nose, mouth, throat, and face: negative Respiratory: negative Cardiovascular: negative Gastrointestinal: negative Genitourinary:negative Integument/breast: negative Hematologic/lymphatic: negative Musculoskeletal:negative Neurological: negative Behavioral/Psych: negative Endocrine: negative Allergic/Immunologic: negative   PHYSICAL EXAMINATION: General appearance: alert, cooperative, fatigued and no distress Head: Normocephalic, without obvious abnormality, atraumatic Neck: no adenopathy Lymph nodes: Cervical, supraclavicular, and axillary nodes normal. Resp: clear to auscultation bilaterally Cardio: regular rate and rhythm, S1, S2 normal, no murmur, click, rub or gallop GI: soft, non-tender; bowel sounds normal; no masses,  no organomegaly Extremities: extremities normal, atraumatic, no cyanosis or edema Neurologic: Alert and oriented X 3, normal strength and tone. Normal symmetric reflexes. Normal coordination and gait  ECOG PERFORMANCE STATUS: 1 - Symptomatic but completely ambulatory  Blood pressure 152/78, pulse 64, temperature 97.6 F (36.4 C), temperature source Oral, resp. rate 18, height _0  (1.575 m), weight 114 lb (51.71 kg), SpO2 99.00%.  LABORATORY DATA: Lab Results  Component Value Date   WBC 4.2 04/06/2013   HGB 13.5 04/06/2013   HCT 38.6 04/06/2013   MCV 110.2* 04/06/2013   PLT 244 04/06/2013      Chemistry      Component Value Date/Time   NA 131* 03/09/2013 0906   NA 139 08/04/2012 0844   K 3.8 03/09/2013 0906   K 4.0 08/04/2012 0844   CL 101 08/04/2012 0844    CL 102 04/01/2012 0805   CO2 26 03/09/2013 0906   CO2 30 08/04/2012 0844   BUN 13.1 03/09/2013 0906   BUN 16 08/04/2012 0844   CREATININE 0.9 03/09/2013 0906   CREATININE 1.00 08/04/2012 0844      Component Value Date/Time   CALCIUM 9.5 03/09/2013 0906   CALCIUM 9.8 08/04/2012 0844   ALKPHOS 92 03/09/2013 0906   ALKPHOS 106 03/12/2011 0842   AST 20 03/09/2013 0906   AST 22 03/12/2011 0842   ALT 23 03/09/2013 0906   ALT 12 03/12/2011 0842   BILITOT 0.77 03/09/2013 0906   BILITOT 0.4 03/12/2011 0842       RADIOGRAPHIC STUDIES:  ASSESSMENT AND  PLAN: This is a very pleasant 80 years old white female with metastatic renal cell carcinoma.  She is currently on treatment with Votrient 800 mg by mouth daily status post 7 months of treatment and tolerating it fairly well.  Her recent scan showed no evidence for disease progression. I discussed the scan results with the patient and her son. I recommended for her to continue on treatment with Votrient with the same dose. She would come back for followup visit in one month for reevaluation with repeat blood work in addition to CT scan of the chest, abdomen and pelvis without contrast for restaging of her disease. She was advised to call immediately if she has any concerning symptoms in the interval. The patient voices understanding of current disease status and treatment options and is in agreement with the current care plan.  All questions were answered. The patient knows to call the clinic with any problems, questions or concerns. We can certainly see the patient much sooner if necessary.  Disclaimer: This note was dictated with voice recognition software. Similar sounding words can inadvertently be transcribed and may not be corrected upon review.

## 2013-04-08 ENCOUNTER — Telehealth: Payer: Self-pay | Admitting: *Deleted

## 2013-04-08 NOTE — Telephone Encounter (Signed)
Message copied by Norma Fredrickson on Fri Apr 08, 2013  2:33 PM ------      Message from: Carlton Adam      Created: Fri Apr 08, 2013  2:25 PM       Abnormal results, please call and notify patient to increase dietary intake of potassium rich foods ------

## 2013-04-08 NOTE — Telephone Encounter (Signed)
Left message for patient to call back regarding lab results.

## 2013-04-08 NOTE — Telephone Encounter (Signed)
Called and informed patient to increase dietary potassium intake.  Per Awilda Metro, PA.  Patient verbalized understanding.

## 2013-04-18 DIAGNOSIS — R300 Dysuria: Secondary | ICD-10-CM | POA: Diagnosis not present

## 2013-04-18 DIAGNOSIS — N390 Urinary tract infection, site not specified: Secondary | ICD-10-CM | POA: Diagnosis not present

## 2013-05-04 ENCOUNTER — Ambulatory Visit: Payer: Medicare Other

## 2013-05-04 ENCOUNTER — Other Ambulatory Visit (HOSPITAL_BASED_OUTPATIENT_CLINIC_OR_DEPARTMENT_OTHER): Payer: Medicare Other

## 2013-05-04 ENCOUNTER — Ambulatory Visit (HOSPITAL_COMMUNITY)
Admission: RE | Admit: 2013-05-04 | Discharge: 2013-05-04 | Disposition: A | Discharge status: Home / Self Care | Payer: Medicare Other | Source: Ambulatory Visit | Attending: Internal Medicine | Admitting: Internal Medicine

## 2013-05-04 VITALS — BP 128/73 | HR 62 | Temp 96.5°F | Resp 16

## 2013-05-04 DIAGNOSIS — Z85528 Personal history of other malignant neoplasm of kidney: Secondary | ICD-10-CM | POA: Insufficient documentation

## 2013-05-04 DIAGNOSIS — C8590 Non-Hodgkin lymphoma, unspecified, unspecified site: Secondary | ICD-10-CM

## 2013-05-04 DIAGNOSIS — C7800 Secondary malignant neoplasm of unspecified lung: Secondary | ICD-10-CM

## 2013-05-04 DIAGNOSIS — Z95828 Presence of other vascular implants and grafts: Secondary | ICD-10-CM

## 2013-05-04 DIAGNOSIS — C649 Malignant neoplasm of unspecified kidney, except renal pelvis: Secondary | ICD-10-CM

## 2013-05-04 DIAGNOSIS — C8589 Other specified types of non-Hodgkin lymphoma, extranodal and solid organ sites: Secondary | ICD-10-CM | POA: Diagnosis not present

## 2013-05-04 DIAGNOSIS — Z905 Acquired absence of kidney: Secondary | ICD-10-CM | POA: Diagnosis not present

## 2013-05-04 LAB — COMPREHENSIVE METABOLIC PANEL (CC13)
ALK PHOS: 99 U/L (ref 40–150)
ALT: 18 U/L (ref 0–55)
AST: 19 U/L (ref 5–34)
Albumin: 3.6 g/dL (ref 3.5–5.0)
Anion Gap: 14 mEq/L — ABNORMAL HIGH (ref 3–11)
BILIRUBIN TOTAL: 0.82 mg/dL (ref 0.20–1.20)
BUN: 16 mg/dL (ref 7.0–26.0)
CO2: 26 mEq/L (ref 22–29)
Calcium: 9.9 mg/dL (ref 8.4–10.4)
Chloride: 91 mEq/L — ABNORMAL LOW (ref 98–109)
Creatinine: 0.8 mg/dL (ref 0.6–1.1)
Glucose: 91 mg/dl (ref 70–140)
POTASSIUM: 3.5 meq/L (ref 3.5–5.1)
SODIUM: 131 meq/L — AB (ref 136–145)
TOTAL PROTEIN: 6.3 g/dL — AB (ref 6.4–8.3)

## 2013-05-04 LAB — CBC WITH DIFFERENTIAL/PLATELET
BASO%: 0.3 % (ref 0.0–2.0)
Basophils Absolute: 0 10*3/uL (ref 0.0–0.1)
EOS%: 2 % (ref 0.0–7.0)
Eosinophils Absolute: 0.1 10*3/uL (ref 0.0–0.5)
HCT: 35.8 % (ref 34.8–46.6)
HGB: 13 g/dL (ref 11.6–15.9)
LYMPH%: 18.8 % (ref 14.0–49.7)
MCH: 38.1 pg — AB (ref 25.1–34.0)
MCHC: 36.3 g/dL — ABNORMAL HIGH (ref 31.5–36.0)
MCV: 105 fL — AB (ref 79.5–101.0)
MONO#: 0.4 10*3/uL (ref 0.1–0.9)
MONO%: 11.3 % (ref 0.0–14.0)
NEUT#: 2.3 10*3/uL (ref 1.5–6.5)
NEUT%: 67.6 % (ref 38.4–76.8)
PLATELETS: 231 10*3/uL (ref 145–400)
RBC: 3.41 10*6/uL — AB (ref 3.70–5.45)
RDW: 13.3 % (ref 11.2–14.5)
WBC: 3.5 10*3/uL — ABNORMAL LOW (ref 3.9–10.3)
lymph#: 0.7 10*3/uL — ABNORMAL LOW (ref 0.9–3.3)

## 2013-05-04 LAB — LACTATE DEHYDROGENASE (CC13): LDH: 189 U/L (ref 125–245)

## 2013-05-04 MED ORDER — HEPARIN SOD (PORK) LOCK FLUSH 100 UNIT/ML IV SOLN
500.0000 [IU] | Freq: Once | INTRAVENOUS | Status: AC
  Administered 2013-05-04: 500 [IU] via INTRAVENOUS
  Filled 2013-05-04: qty 5

## 2013-05-04 MED ORDER — SODIUM CHLORIDE 0.9 % IJ SOLN
10.0000 mL | INTRAMUSCULAR | Status: DC | PRN
  Administered 2013-05-04: 10 mL via INTRAVENOUS
  Filled 2013-05-04: qty 10

## 2013-05-04 NOTE — Patient Instructions (Signed)

## 2013-05-12 ENCOUNTER — Encounter: Payer: Self-pay | Admitting: Internal Medicine

## 2013-05-12 ENCOUNTER — Telehealth: Payer: Self-pay | Admitting: Internal Medicine

## 2013-05-12 ENCOUNTER — Ambulatory Visit (HOSPITAL_BASED_OUTPATIENT_CLINIC_OR_DEPARTMENT_OTHER): Payer: Medicare Other | Admitting: Internal Medicine

## 2013-05-12 ENCOUNTER — Telehealth: Payer: Self-pay | Admitting: *Deleted

## 2013-05-12 VITALS — BP 140/70 | HR 66 | Temp 97.8°F | Resp 17 | Ht 62.0 in | Wt 111.9 lb

## 2013-05-12 DIAGNOSIS — C7800 Secondary malignant neoplasm of unspecified lung: Secondary | ICD-10-CM | POA: Diagnosis not present

## 2013-05-12 DIAGNOSIS — C8589 Other specified types of non-Hodgkin lymphoma, extranodal and solid organ sites: Secondary | ICD-10-CM | POA: Diagnosis not present

## 2013-05-12 DIAGNOSIS — N390 Urinary tract infection, site not specified: Secondary | ICD-10-CM

## 2013-05-12 DIAGNOSIS — C649 Malignant neoplasm of unspecified kidney, except renal pelvis: Secondary | ICD-10-CM

## 2013-05-12 NOTE — Progress Notes (Signed)
[place] [phone]   [phone]  OFFICE PROGRESS NOTE  [name], MD [address] [place] [address]  DIAGNOSIS:  #1 non-Hodgkin's lymphoma diagnosed on bone marrow biopsy in June of 2012  #2 metastatic renal cell carcinoma initially diagnosed as stage III (T3a N0M0) renal cell carcinoma diagnosed in June of 2012 with pulmonary metastasis diagnosed in August of 2014.  PRIOR THERAPY:  1) Status post right nephrectomy under the care of Dr. Diona Fanti on 09/02/2010.  2) Systemic chemotherapy with CHOP/Rituxan given every 3 weeks, status post 6 cycles.   CURRENT THERAPY: Votrient 800 mg by mouth daily. Status post 8 months of treatment.  INTERVAL HISTORY: Melissa Cross 80 y.o. female returns to the clinic today for followup visit accompanied by her son. The patient is feeling fine today with no specific complaints except for mild fatigue. She was recently diagnosed with urinary tract infection and currently on treatment with Macrodantin, last dose expected tomorrow. She has some mild nausea and lack of appetite with the Macrodantin treatment. She lost 3 pounds recently. She denied having any significant night sweats. The patient denied having any chest pain, shortness of breath, cough or hemoptysis. She has no nausea or vomiting. The patient denied having any fever or chills. She is tolerating her treatment with Votrient fairly well. She had repeat CT scan of the chest, abdomen and pelvis performed recently and she is here for evaluation and discussion of her scan results.  MEDICAL HISTORY: Past Medical History  Diagnosis Date  . Hypercholesteremia     takes Zocor daily  . Glaucoma   . Anemia   . Stroke     hx of TIA  . Blood transfusion   . Thrush 12/17/2010  . Renal cell carcinoma   . Non Hodgkin's lymphoma   . Cancer     kidney cancer, NHL   . Cancer     last chemo 11/04/10   . Hypertension     takes  Nadolol daily  . Hypothyroidism     takes Synthroid daily  . Hypothyroid   . PONV (postoperative nausea and vomiting)   . Shortness of breath     with exertion  . Headache(784.0)     hx of migraines  . TIA (transient ischemic attack)   . Peripheral neuropathy   . Arthritis     knee /hip  . Scoliosis   . History of bladder infections   . History of blood transfusion   . Cataract     right  . History of shingles     ALLERGIES:  is allergic to crab and dilaudid.  MEDICATIONS:  Current Outpatient Prescriptions  Medication Sig Dispense Refill  . amLODipine (NORVASC) 5 MG tablet Take 1 tablet (5 mg total) by mouth daily.  30 tablet  0  . aspirin EC 81 MG tablet Take 81 mg by mouth at bedtime.       . Calcium Citrate (CITRACAL PO) Take 1 tablet by mouth 3 (three) times a week.       . chlorhexidine (PERIDEX) 0.12 % solution       . cholecalciferol (VITAMIN D) 400 UNITS TABS Take 400 Units by mouth daily.        [name] levothyroxine (SYNTHROID, LEVOTHROID) 50 MCG tablet Take 50 mcg by mouth every morning.       [name] losartan-hydrochlorothiazide (HYZAAR) 50-12.5 MG per tablet Take 1 tablet by mouth daily.      [name]  Multiple Vitamin (MULITIVITAMIN WITH MINERALS) TABS Take 1 tablet by mouth daily.        . nadolol (CORGARD) 40 MG tablet Take 40 mg by mouth at bedtime.       . nitrofurantoin, macrocrystal-monohydrate, (MACROBID) 100 MG capsule       . pazopanib (VOTRIENT) 200 MG tablet Take 4 tablets (800 mg total) by mouth daily. Take on an empty stomach.  120 tablet  2  . sodium chloride (MURO 128) 5 % ophthalmic solution Place 1 drop into the left eye 3 (three) times daily.      . vitamin B-12 (CYANOCOBALAMIN) 1000 MCG tablet Place 1,000 mcg under the tongue daily.        No current facility-administered medications for this visit.   Facility-Administered Medications Ordered in Other Visits  Medication Dose Route Frequency Provider Last Rate Last Dose  . sodium chloride 0.9 % injection 10 mL   10 mL Intravenous PRN Curt Bears, MD   10 mL at 05/17/12 0846    SURGICAL HISTORY:  Past Surgical History  Procedure Laterality Date  . Nephrectomy      Right Side  . Portacath placement      right subclavian  . Tonsillectomy      as a child  . Mouth surgery    . Left cataract removed    . Video bronchoscopy with endobronchial ultrasound N/A 08/06/2012    Procedure: VIDEO BRONCHOSCOPY WITH ENDOBRONCHIAL ULTRASOUND;  Surgeon: Collene Gobble, MD;  Location: [place];  Service: Thoracic;  Laterality: N/A;  . Video bronchoscopy with endobronchial navigation N/A 08/06/2012    Procedure: VIDEO BRONCHOSCOPY WITH ENDOBRONCHIAL NAVIGATION;  Surgeon: Collene Gobble, MD;  Location: [place];  Service: Thoracic;  Laterality: N/A;    REVIEW OF SYSTEMS:  Constitutional: positive for fatigue Eyes: negative Ears, nose, mouth, throat, and face: negative Respiratory: negative Cardiovascular: negative Gastrointestinal: negative Genitourinary:negative Integument/breast: negative Hematologic/lymphatic: negative Musculoskeletal:negative Neurological: negative Behavioral/Psych: negative Endocrine: negative Allergic/Immunologic: negative   PHYSICAL EXAMINATION: General appearance: alert, cooperative, fatigued and no distress Head: Normocephalic, without obvious abnormality, atraumatic Neck: no adenopathy Lymph nodes: Cervical, supraclavicular, and axillary nodes normal. Resp: clear to auscultation bilaterally Cardio: regular rate and rhythm, S1, S2 normal, no murmur, click, rub or gallop GI: soft, non-tender; bowel sounds normal; no masses,  no organomegaly Extremities: extremities normal, atraumatic, no cyanosis or edema Neurologic: Alert and oriented X 3, normal strength and tone. Normal symmetric reflexes. Normal coordination and gait  ECOG PERFORMANCE STATUS: 1 - Symptomatic but completely ambulatory  Blood pressure 140/70, pulse 66, temperature 97.8 F (36.6 C), temperature source Oral, resp.  rate 17, height _0  (1.575 m), weight 111 lb 14.4 oz (50.758 kg).  LABORATORY DATA: Lab Results  Component Value Date   WBC 3.5* 05/04/2013   HGB 13.0 05/04/2013   HCT 35.8 05/04/2013   MCV 105.0* 05/04/2013   PLT 231 05/04/2013      Chemistry      Component Value Date/Time   NA 131* 05/04/2013 0915   NA 139 08/04/2012 0844   K 3.5 05/04/2013 0915   K 4.0 08/04/2012 0844   CL 101 08/04/2012 0844   CL 102 04/01/2012 0805   CO2 26 05/04/2013 0915   CO2 30 08/04/2012 0844   BUN 16.0 05/04/2013 0915   BUN 16 08/04/2012 0844   CREATININE 0.8 05/04/2013 0915   CREATININE 1.00 08/04/2012 0844      Component Value Date/Time   CALCIUM 9.9 05/04/2013 0915   CALCIUM  9.8 08/04/2012 0844   ALKPHOS 99 05/04/2013 0915   ALKPHOS 106 03/12/2011 0842   AST 19 05/04/2013 0915   AST 22 03/12/2011 0842   ALT 18 05/04/2013 0915   ALT 12 03/12/2011 0842   BILITOT 0.82 05/04/2013 0915   BILITOT 0.4 03/12/2011 0842       RADIOGRAPHIC STUDIES: Ct Chest Wo Contrast  05/04/2013   CLINICAL DATA:  Non-Hodgkin's lymphoma and history of right renal cell carcinoma.  EXAM: CT CHEST, ABDOMEN AND PELVIS WITHOUT CONTRAST  TECHNIQUE: Multidetector CT imaging of the chest, abdomen and pelvis was performed following the standard protocol without IV contrast.  COMPARISON:  12/07/2013.  11/09/2012.  FINDINGS:   CT CHEST FINDINGS  The tip of the right-sided Port-A-Cath is positioned in the upper right atrium, near the junction with the SVC.  There is no axillary lymphadenopathy. Previously measured precarinal lymph node is stable at 11 mm. 12 mm short axis subcarinal lymph node is also unchanged. No bulky hilar lymphadenopathy. The heart size is normal. The tiny amount of anterior pericardial fluid seen previously has decreased in the interval. Coronary artery calcification is noted. No evidence for pleural effusion.  Lung windows again show the subpleural nodularity in the right middle lobe. The dominant nodular component is 13 mm today  which is unchanged since the prior. A second nodular component contiguous with a 13 mm nodule was measured previously at 10 mm. This area measures 9 mm today. Subpleural lymph node along the right major fissure (image 28) is stable. Lungs are otherwise clear.  Bone windows reveal no worrisome lytic or sclerotic osseous lesions.    CT ABDOMEN AND PELVIS FINDINGS  15 mm water density lesion in the right liver is stable and compatible with a cyst. Spleen has normal on infused features. The stomach is normal. There is a large duodenum diverticulum. Pancreas is unremarkable. There is stable bilateral adrenal thickening without a discrete adrenal nodule evident. Gallbladder occupies a more posterior position than typically seen but is otherwise normal in appearance.  Right kidney is surgically absent. No abnormal soft tissue attenuation in the nephrectomy bed. Left kidney is unremarkable. No evidence for free fluid or lymphadenopathy in the abdomen. Large stool volume noted in the colon.  Imaging through the pelvis shows no free intraperitoneal fluid. There is no pelvic sidewall lymphadenopathy. The bladder is unremarkable. Uterus again noted to have an approximately 1 cm hyper attenuating focus centrally. 2.8 cm cystic lesion in the left adnexal space measured previously is 2.4 cm in the same dimension when measured today. There is no right adnexal lesion.  Advanced diverticular disease is seen in the left colon without features of diverticulitis. Terminal ileum is normal. The appendix is normal  Bone windows reveal no worrisome lytic or sclerotic osseous lesions.    IMPRESSION: Stable exam. The small nodules in the right middle lobe are unchanged. Borderline mediastinal lymphadenopathy is also stable. No new or progressive findings.  Status post right nephrectomy. No evidence for local recurrent disease.  Persistence of the hyper attenuating area within the central uterus. This may be an endometrial polyp or  submucosal fibroid.  Slight interval decrease in size of the cystic left ovarian lesion. The   Electronically Signed   By: Misty Stanley M.D.   On: 05/04/2013 12:46   ASSESSMENT AND PLAN: This is a very pleasant 80 years old white female with metastatic renal cell carcinoma.  She is currently on treatment with Votrient 800 mg by mouth daily status post  8 months of treatment and tolerating it fairly well.  The recent scan of the chest, abdomen and pelvis showed no evidence for disease progression or recurrent disease. I discussed the scan results with the patient and her son. I recommended for her to continue on treatment with Votrient 800 mg by mouth daily. For urinary tract infection, she would continue her current treatment with Macrodantin She was advised to call immediately if she has any concerning symptoms in the interval. The patient voices understanding of current disease status and treatment options and is in agreement with the current care plan.  All questions were answered. The patient knows to call the clinic with any problems, questions or concerns. We can certainly see the patient much sooner if necessary.  Disclaimer: This note was dictated with voice recognition software. Similar sounding words can inadvertently be transcribed and may not be corrected upon review.

## 2013-05-12 NOTE — Telephone Encounter (Signed)
Received prior authorization request from [place] for [place].  Gave request to care management.

## 2013-05-12 NOTE — Progress Notes (Signed)
[place], [phone], about votrient 200mg  pa; should receive response within 24-72 hours.

## 2013-05-12 NOTE — Telephone Encounter (Signed)
gv and printed appt sched and avs for pt for June... °

## 2013-05-13 DIAGNOSIS — R300 Dysuria: Secondary | ICD-10-CM | POA: Diagnosis not present

## 2013-05-13 DIAGNOSIS — N390 Urinary tract infection, site not specified: Secondary | ICD-10-CM | POA: Diagnosis not present

## 2013-05-16 ENCOUNTER — Encounter: Payer: Self-pay | Admitting: Internal Medicine

## 2013-05-16 NOTE — Progress Notes (Signed)
[employer] approved votrient from 05/12/13-05/13/14

## 2013-05-18 ENCOUNTER — Telehealth: Payer: Self-pay | Admitting: Dietician

## 2013-05-18 ENCOUNTER — Telehealth: Payer: Self-pay | Admitting: Medical Oncology

## 2013-05-18 NOTE — Telephone Encounter (Signed)
Brief Outpatient Oncology Nutrition Note  Patient has been identified to be at risk on malnutrition screen.  Wt Readings from Last 10 Encounters:  05/12/13 111 lb 14.4 oz (50.758 kg)  04/06/13 114 lb (51.71 kg)  03/09/13 113 lb 9.6 oz (51.529 kg)  02/09/13 113 lb 11.2 oz (51.574 kg)  01/10/13 115 lb 1.6 oz (52.209 kg)  12/10/12 115 lb 12.8 oz (52.527 kg)  11/16/12 115 lb 6.4 oz (52.345 kg)  10/18/12 115 lb 12.8 oz (52.527 kg)  09/30/12 116 lb 3.2 oz (52.708 kg)  09/16/12 115 lb 14.4 oz (52.572 kg)    DIAGNOSIS:  #1 non-Hodgkin's lymphoma diagnosed on bone marrow biopsy in June of 2012  #2 metastatic renal cell carcinoma initially diagnosed as stage III (T3a N0M0) renal cell carcinoma diagnosed in June of 2012 with pulmonary metastasis diagnosed in August of 2014   Called patient due to weight loss.    -no appetite  -taste alterations  -dental problems needing root canal tomorrow She is drinking Premier shakes 1-2 daily and trying to eat despite problems.  Encouraged patient to work on increasing calories and to call for any questions or if she would like an appointment.  [place] available as needed.  Antonieta Iba, RD, LDN

## 2013-05-18 NOTE — Telephone Encounter (Signed)
Having 2 root canals tomorrow -should she stop Votrient? Per Dr Julien Nordmann I told pt to skip Votrient tomorrow and Friday and resume on sat.

## 2013-05-19 DIAGNOSIS — N390 Urinary tract infection, site not specified: Secondary | ICD-10-CM | POA: Diagnosis not present

## 2013-05-23 ENCOUNTER — Telehealth: Payer: Self-pay | Admitting: *Deleted

## 2013-05-23 NOTE — Telephone Encounter (Signed)
Urine culture done at [facility] dated 04/18/13 by Dr Orland Mustard given to Dr Vista Mink to review.  SLJ

## 2013-06-08 DIAGNOSIS — N390 Urinary tract infection, site not specified: Secondary | ICD-10-CM | POA: Diagnosis not present

## 2013-06-08 DIAGNOSIS — R300 Dysuria: Secondary | ICD-10-CM | POA: Diagnosis not present

## 2013-06-09 ENCOUNTER — Encounter (HOSPITAL_COMMUNITY): Payer: Self-pay | Admitting: Emergency Medicine

## 2013-06-09 ENCOUNTER — Inpatient Hospital Stay (HOSPITAL_COMMUNITY)
Admission: EM | Admit: 2013-06-09 | Discharge: 2013-06-12 | DRG: 690 | Disposition: A | Discharge status: Home / Self Care | Payer: Medicare Other | Attending: Internal Medicine | Admitting: Internal Medicine

## 2013-06-09 DIAGNOSIS — C649 Malignant neoplasm of unspecified kidney, except renal pelvis: Secondary | ICD-10-CM

## 2013-06-09 DIAGNOSIS — Z823 Family history of stroke: Secondary | ICD-10-CM

## 2013-06-09 DIAGNOSIS — E039 Hypothyroidism, unspecified: Secondary | ICD-10-CM | POA: Diagnosis present

## 2013-06-09 DIAGNOSIS — H409 Unspecified glaucoma: Secondary | ICD-10-CM | POA: Diagnosis present

## 2013-06-09 DIAGNOSIS — B965 Pseudomonas (aeruginosa) (mallei) (pseudomallei) as the cause of diseases classified elsewhere: Secondary | ICD-10-CM | POA: Diagnosis not present

## 2013-06-09 DIAGNOSIS — C641 Malignant neoplasm of right kidney, except renal pelvis: Secondary | ICD-10-CM | POA: Diagnosis present

## 2013-06-09 DIAGNOSIS — Z87891 Personal history of nicotine dependence: Secondary | ICD-10-CM

## 2013-06-09 DIAGNOSIS — R112 Nausea with vomiting, unspecified: Secondary | ICD-10-CM | POA: Diagnosis not present

## 2013-06-09 DIAGNOSIS — R32 Unspecified urinary incontinence: Secondary | ICD-10-CM | POA: Diagnosis present

## 2013-06-09 DIAGNOSIS — E871 Hypo-osmolality and hyponatremia: Secondary | ICD-10-CM

## 2013-06-09 DIAGNOSIS — Z87440 Personal history of urinary (tract) infections: Secondary | ICD-10-CM | POA: Diagnosis not present

## 2013-06-09 DIAGNOSIS — I10 Essential (primary) hypertension: Secondary | ICD-10-CM

## 2013-06-09 DIAGNOSIS — Z79899 Other long term (current) drug therapy: Secondary | ICD-10-CM | POA: Diagnosis not present

## 2013-06-09 DIAGNOSIS — Z8673 Personal history of transient ischemic attack (TIA), and cerebral infarction without residual deficits: Secondary | ICD-10-CM

## 2013-06-09 DIAGNOSIS — C7800 Secondary malignant neoplasm of unspecified lung: Secondary | ICD-10-CM | POA: Diagnosis present

## 2013-06-09 DIAGNOSIS — N390 Urinary tract infection, site not specified: Secondary | ICD-10-CM | POA: Diagnosis not present

## 2013-06-09 DIAGNOSIS — Z800 Family history of malignant neoplasm of digestive organs: Secondary | ICD-10-CM

## 2013-06-09 DIAGNOSIS — B370 Candidal stomatitis: Secondary | ICD-10-CM

## 2013-06-09 DIAGNOSIS — E876 Hypokalemia: Secondary | ICD-10-CM

## 2013-06-09 DIAGNOSIS — R911 Solitary pulmonary nodule: Secondary | ICD-10-CM

## 2013-06-09 DIAGNOSIS — E440 Moderate protein-calorie malnutrition: Secondary | ICD-10-CM

## 2013-06-09 DIAGNOSIS — C8330 Diffuse large B-cell lymphoma, unspecified site: Secondary | ICD-10-CM

## 2013-06-09 DIAGNOSIS — B952 Enterococcus as the cause of diseases classified elsewhere: Secondary | ICD-10-CM | POA: Diagnosis not present

## 2013-06-09 DIAGNOSIS — Z85528 Personal history of other malignant neoplasm of kidney: Secondary | ICD-10-CM | POA: Diagnosis not present

## 2013-06-09 DIAGNOSIS — IMO0002 Reserved for concepts with insufficient information to code with codable children: Secondary | ICD-10-CM

## 2013-06-09 DIAGNOSIS — C8589 Other specified types of non-Hodgkin lymphoma, extranodal and solid organ sites: Secondary | ICD-10-CM | POA: Diagnosis present

## 2013-06-09 DIAGNOSIS — Z7982 Long term (current) use of aspirin: Secondary | ICD-10-CM | POA: Diagnosis not present

## 2013-06-09 DIAGNOSIS — Z9849 Cataract extraction status, unspecified eye: Secondary | ICD-10-CM | POA: Diagnosis not present

## 2013-06-09 LAB — CBC WITH DIFFERENTIAL/PLATELET
Basophils Absolute: 0 10*3/uL (ref 0.0–0.1)
Basophils Relative: 0 % (ref 0–1)
EOS ABS: 0 10*3/uL (ref 0.0–0.7)
EOS PCT: 0 % (ref 0–5)
HCT: 37.5 % (ref 36.0–46.0)
Hemoglobin: 13.7 g/dL (ref 12.0–15.0)
Lymphocytes Relative: 4 % — ABNORMAL LOW (ref 12–46)
Lymphs Abs: 0.4 10*3/uL — ABNORMAL LOW (ref 0.7–4.0)
MCH: 38.5 pg — ABNORMAL HIGH (ref 26.0–34.0)
MCHC: 36.5 g/dL — ABNORMAL HIGH (ref 30.0–36.0)
MCV: 105.3 fL — AB (ref 78.0–100.0)
Monocytes Absolute: 0.7 10*3/uL (ref 0.1–1.0)
Monocytes Relative: 7 % (ref 3–12)
NEUTROS PCT: 89 % — AB (ref 43–77)
Neutro Abs: 9.6 10*3/uL — ABNORMAL HIGH (ref 1.7–7.7)
Platelets: 221 10*3/uL (ref 150–400)
RBC: 3.56 MIL/uL — ABNORMAL LOW (ref 3.87–5.11)
RDW: 13.2 % (ref 11.5–15.5)
WBC: 10.8 10*3/uL — AB (ref 4.0–10.5)

## 2013-06-09 LAB — BASIC METABOLIC PANEL
BUN: 11 mg/dL (ref 6–23)
CALCIUM: 9.4 mg/dL (ref 8.4–10.5)
CO2: 26 mEq/L (ref 19–32)
Chloride: 83 mEq/L — ABNORMAL LOW (ref 96–112)
Creatinine, Ser: 0.61 mg/dL (ref 0.50–1.10)
GFR calc non Af Amer: 80 mL/min — ABNORMAL LOW (ref >90)
Glucose, Bld: 105 mg/dL — ABNORMAL HIGH (ref 70–99)
Potassium: 3 mEq/L — ABNORMAL LOW (ref 3.7–5.3)
Sodium: 125 mEq/L — ABNORMAL LOW (ref 137–147)

## 2013-06-09 LAB — I-STAT CG4 LACTIC ACID, ED: Lactic Acid, Venous: 2.28 mmol/L — ABNORMAL HIGH (ref 0.5–2.2)

## 2013-06-09 MED ORDER — SODIUM CHLORIDE 0.9 % IV BOLUS (SEPSIS)
500.0000 mL | Freq: Once | INTRAVENOUS | Status: AC
  Administered 2013-06-09: 500 mL via INTRAVENOUS

## 2013-06-09 MED ORDER — ONDANSETRON HCL 4 MG/2ML IJ SOLN
4.0000 mg | Freq: Once | INTRAMUSCULAR | Status: AC
  Administered 2013-06-09: 4 mg via INTRAVENOUS
  Filled 2013-06-09: qty 2

## 2013-06-09 MED ORDER — POTASSIUM CHLORIDE 10 MEQ/100ML IV SOLN
10.0000 meq | INTRAVENOUS | Status: AC
  Administered 2013-06-09 – 2013-06-10 (×3): 10 meq via INTRAVENOUS
  Filled 2013-06-09 (×4): qty 100

## 2013-06-09 NOTE — ED Notes (Signed)
Pt states that she has been having recurrent UTI's; pt states that she saw her PCP 6/3 and was diagnosed with UTI; pt states that she started taking her anbx this evening and is has been vomiting; pt states that she has also become incontinent of urine as well.

## 2013-06-09 NOTE — ED Provider Notes (Signed)
CSN: [id number]     Arrival date & time 06/09/13  2147 History   First MD Initiated Contact with Patient 06/09/13 2221     Chief Complaint  Patient presents with  . Urinary Tract Infection     (Consider location/radiation/quality/duration/timing/severity/associated sxs/prior Treatment) HPI  Melissa Cross is a 80 y.o. female presenting for evaluation after several episodes of emesis today patient has frequent urinary tract infections and has had dysuria, frequency and urgency over the course of last several days. She cannot make it to the bathroom in time and is soiled herself several times a day. She's also had loose stool. Temperature is 99.1 at home there is associated chills.. Patient was seen at her primary care doctor and urinary sample revealed many white blood cells, she has frequent urinary tract infections from enterococcus susceptible to fosfomycin. She has not been able to obtain Fosamax until later this afternoon. She's been having chills and generalized weakness/fatigue, she denies back pain or abdominal pain, no change in bowel habits. She has a history of renal cell carcinoma with right nephrectomy in 2012 next the lung and lymph nodes.   ONC mohamed Oral chemo  Dalstat nephrolectoly 2012  Past Medical History  Diagnosis Date  . Hypercholesteremia     takes Zocor daily  . Glaucoma   . Anemia   . Stroke     hx of TIA  . Blood transfusion   . Thrush 12/17/2010  . Renal cell carcinoma   . Non Hodgkin's lymphoma   . Cancer     kidney cancer, NHL   . Cancer     last chemo 11/04/10   . Hypertension     takes Nadolol daily  . Hypothyroidism     takes Synthroid daily  . Hypothyroid   . PONV (postoperative nausea and vomiting)   . Shortness of breath     with exertion  . Headache(784.0)     hx of migraines  . TIA (transient ischemic attack)   . Peripheral neuropathy   . Arthritis     knee /hip  . Scoliosis   . History of bladder infections   . History of blood  transfusion   . Cataract     right  . History of shingles    Past Surgical History  Procedure Laterality Date  . Nephrectomy      Right Side  . Portacath placement      right subclavian  . Tonsillectomy      as a child  . Mouth surgery    . Left cataract removed    . Video bronchoscopy with endobronchial ultrasound N/A 08/06/2012    Procedure: VIDEO BRONCHOSCOPY WITH ENDOBRONCHIAL ULTRASOUND;  Surgeon: Collene Gobble, MD;  Location: [place];  Service: Thoracic;  Laterality: N/A;  . Video bronchoscopy with endobronchial navigation N/A 08/06/2012    Procedure: VIDEO BRONCHOSCOPY WITH ENDOBRONCHIAL NAVIGATION;  Surgeon: Collene Gobble, MD;  Location: [facility];  Service: Thoracic;  Laterality: N/A;   Family History  Problem Relation Age of Onset  . Colon cancer Father   . Stroke Mother    History  Substance Use Topics  . Smoking status: Former Smoker -- 0.50 packs/day for 25 years    Quit date: 01/07/1992  . Smokeless tobacco: Not on file     Comment: quit many yrs ago  . Alcohol Use: No   OB History   Grav Para Term Preterm Abortions TAB SAB Ect Mult Living  Review of Systems  10 systems reviewed and found to be negative, except as noted in the HPI.   Allergies  Crab and Dilaudid  Home Medications   Prior to Admission medications   Medication Sig Start Date End Date Taking? Authorizing Provider  amLODipine (NORVASC) 5 MG tablet Take 1 tablet (5 mg total) by mouth daily. 09/30/12  Yes Carlton Adam, PA-C  aspirin EC 81 MG tablet Take 81 mg by mouth at bedtime.    Yes Historical Provider, MD  Calcium Citrate (CITRACAL PO) Take 1 tablet by mouth 3 (three) times a week.    Yes Historical Provider, MD  cholecalciferol (VITAMIN D) 400 UNITS TABS Take 400 Units by mouth daily.     Yes Historical Provider, MD  levothyroxine (SYNTHROID, LEVOTHROID) 50 MCG tablet Take 50 mcg by mouth every morning.    Yes Historical Provider, MD  losartan-hydrochlorothiazide (HYZAAR)  50-12.5 MG per tablet Take 1 tablet by mouth daily.   Yes Historical Provider, MD  Multiple Vitamin (MULITIVITAMIN WITH MINERALS) TABS Take 1 tablet by mouth daily.     Yes Historical Provider, MD  nadolol (CORGARD) 40 MG tablet Take 40 mg by mouth at bedtime.    Yes Historical Provider, MD  pazopanib (VOTRIENT) 200 MG tablet Take 4 tablets (800 mg total) by mouth daily. Take on an empty stomach. 08/30/12  Yes Curt Bears, MD  sodium chloride (MURO 128) 5 % ophthalmic solution Place 1 drop into the left eye 3 (three) times daily.   Yes Historical Provider, MD  vitamin B-12 (CYANOCOBALAMIN) 1000 MCG tablet Place 1,000 mcg under the tongue daily.    Yes Historical Provider, MD   BP 141/75  Pulse 79  Temp(Src) 99.7 F (37.6 C) (Oral)  Resp 16  SpO2 96% Physical Exam  Nursing note and vitals reviewed. Constitutional: She is oriented to person, place, and time. She appears well-developed and well-nourished. No distress.  Frail  HENT:  Head: Normocephalic.  Mouth/Throat: Oropharynx is clear and moist.  Dry mucous membranes  Eyes: Conjunctivae and EOM are normal. Pupils are equal, round, and reactive to light.  Cardiovascular: Normal rate, regular rhythm and intact distal pulses.   Pulmonary/Chest: Effort normal and breath sounds normal. No stridor.  Genitourinary:  No CVA TTP bilaterally  Musculoskeletal: Normal range of motion.  Neurological: She is alert and oriented to person, place, and time.  Psychiatric: She has a normal mood and affect.    ED Course  Procedures (including critical care time) Labs Review Labs Reviewed  URINALYSIS, ROUTINE W REFLEX MICROSCOPIC - Abnormal; Notable for the following:    Color, Urine ORANGE (*)    APPearance CLOUDY (*)    Hgb urine dipstick TRACE (*)    Bilirubin Urine SMALL (*)    Protein, ur 30 (*)    Urobilinogen, UA 2.0 (*)    Nitrite POSITIVE (*)    Leukocytes, UA LARGE (*)    All other components within normal limits  BASIC METABOLIC  PANEL - Abnormal; Notable for the following:    Sodium 125 (*)    Potassium 3.0 (*)    Chloride 83 (*)    Glucose, Bld 105 (*)    GFR calc non Af Amer 80 (*)    All other components within normal limits  CBC WITH DIFFERENTIAL - Abnormal; Notable for the following:    WBC 10.8 (*)    RBC 3.56 (*)    MCV 105.3 (*)    MCH 38.5 (*)  MCHC 36.5 (*)    Neutrophils Relative % 89 (*)    Neutro Abs 9.6 (*)    Lymphocytes Relative 4 (*)    Lymphs Abs 0.4 (*)    All other components within normal limits  URINE MICROSCOPIC-ADD ON - Abnormal; Notable for the following:    Bacteria, UA FEW (*)    All other components within normal limits  I-STAT CG4 LACTIC ACID, ED - Abnormal; Notable for the following:    Lactic Acid, Venous 2.28 (*)    All other components within normal limits  URINE CULTURE  CULTURE, BLOOD (ROUTINE X 2)  CULTURE, BLOOD (ROUTINE X 2)  HEPATIC FUNCTION PANEL  CBC WITH DIFFERENTIAL    Imaging Review No results found.   EKG Interpretation None      MDM   Final diagnoses:  UTI (lower urinary tract infection)  Hyponatremia  Hypokalemia  Nausea & vomiting    Filed Vitals:   06/09/13 2156 06/09/13 2231  BP: 138/71 141/75  Pulse: 75 79  Temp: 98.4 F (36.9 C) 99.7 F (37.6 C)  TempSrc: Oral Oral  Resp: 20 16  SpO2: 97% 96%    Medications  potassium chloride 10 mEq in 100 mL IVPB (10 mEq Intravenous New Bag/Given 06/09/13 2352)  cefTAZidime (FORTAZ) 1 g in dextrose 5 % 50 mL IVPB (not administered)  sodium chloride 0.9 % bolus 500 mL (500 mLs Intravenous New Bag/Given 06/09/13 2352)  ondansetron (ZOFRAN) injection 4 mg (4 mg Intravenous Given 06/09/13 2352)    Melissa Cross is a 80 y.o. female presenting with UTI several episodes of emesis today. Abdominal exam is benign, there is no back pain, there is no CVA tenderness. Patient is afebrile. Mild white count of 10.8. Blood cultures will be drawn and chart review shows urine culture from 2012 growing  Pseudomonas with sensitivity to [employer]. Blood work shows a significant hyponatremia of 125 and hypokalemia of 3.0. Patient will be repleted intravenously. Hospitalist consult from Dr. Carles Collet appreciated: He will come to admit her.  Monico Blitz, PA-C 06/10/13 [phone]

## 2013-06-10 DIAGNOSIS — N390 Urinary tract infection, site not specified: Principal | ICD-10-CM

## 2013-06-10 DIAGNOSIS — E876 Hypokalemia: Secondary | ICD-10-CM

## 2013-06-10 DIAGNOSIS — B965 Pseudomonas (aeruginosa) (mallei) (pseudomallei) as the cause of diseases classified elsewhere: Secondary | ICD-10-CM

## 2013-06-10 DIAGNOSIS — E871 Hypo-osmolality and hyponatremia: Secondary | ICD-10-CM

## 2013-06-10 DIAGNOSIS — C649 Malignant neoplasm of unspecified kidney, except renal pelvis: Secondary | ICD-10-CM

## 2013-06-10 DIAGNOSIS — B952 Enterococcus as the cause of diseases classified elsewhere: Secondary | ICD-10-CM

## 2013-06-10 DIAGNOSIS — I10 Essential (primary) hypertension: Secondary | ICD-10-CM | POA: Diagnosis present

## 2013-06-10 DIAGNOSIS — R112 Nausea with vomiting, unspecified: Secondary | ICD-10-CM

## 2013-06-10 DIAGNOSIS — C8589 Other specified types of non-Hodgkin lymphoma, extranodal and solid organ sites: Secondary | ICD-10-CM

## 2013-06-10 DIAGNOSIS — E440 Moderate protein-calorie malnutrition: Secondary | ICD-10-CM | POA: Insufficient documentation

## 2013-06-10 DIAGNOSIS — C7800 Secondary malignant neoplasm of unspecified lung: Secondary | ICD-10-CM

## 2013-06-10 LAB — URINE MICROSCOPIC-ADD ON

## 2013-06-10 LAB — BASIC METABOLIC PANEL
BUN: 9 mg/dL (ref 6–23)
BUN: 8 mg/dL (ref 6–23)
CHLORIDE: 90 meq/L — AB (ref 96–112)
CHLORIDE: 95 meq/L — AB (ref 96–112)
CO2: 25 meq/L (ref 19–32)
CO2: 26 mEq/L (ref 19–32)
Calcium: 8.4 mg/dL (ref 8.4–10.5)
Calcium: 8.5 mg/dL (ref 8.4–10.5)
Creatinine, Ser: 0.65 mg/dL (ref 0.50–1.10)
Creatinine, Ser: 0.76 mg/dL (ref 0.50–1.10)
GFR calc Af Amer: >90 mL/min (ref >90)
GFR calc Af Amer: 87 mL/min — ABNORMAL LOW (ref >90)
GFR calc non Af Amer: 79 mL/min — ABNORMAL LOW (ref >90)
GFR calc non Af Amer: 75 mL/min — ABNORMAL LOW (ref >90)
GLUCOSE: 106 mg/dL — AB (ref 70–99)
Glucose, Bld: 84 mg/dL (ref 70–99)
POTASSIUM: 4.2 meq/L (ref 3.7–5.3)
Potassium: 3.8 mEq/L (ref 3.7–5.3)
Sodium: 128 mEq/L — ABNORMAL LOW (ref 137–147)
Sodium: 132 mEq/L — ABNORMAL LOW (ref 137–147)

## 2013-06-10 LAB — HEPATIC FUNCTION PANEL
ALT: 17 U/L (ref 0–35)
AST: 23 U/L (ref 0–37)
Albumin: 3.7 g/dL (ref 3.5–5.2)
Alkaline Phosphatase: 94 U/L (ref 39–117)
BILIRUBIN TOTAL: 0.9 mg/dL (ref 0.3–1.2)
Total Protein: 6.5 g/dL (ref 6.0–8.3)

## 2013-06-10 LAB — URINALYSIS, ROUTINE W REFLEX MICROSCOPIC
Glucose, UA: NEGATIVE mg/dL
KETONES UR: NEGATIVE mg/dL
NITRITE: POSITIVE — AB
PROTEIN: 30 mg/dL — AB
Specific Gravity, Urine: 1.011 (ref 1.005–1.030)
UROBILINOGEN UA: 2 mg/dL — AB (ref 0.0–1.0)
pH: 7.5 (ref 5.0–8.0)

## 2013-06-10 LAB — CBC
HCT: 30.3 % — ABNORMAL LOW (ref 36.0–46.0)
Hemoglobin: 11 g/dL — ABNORMAL LOW (ref 12.0–15.0)
MCH: 37.8 pg — AB (ref 26.0–34.0)
MCHC: 36.3 g/dL — ABNORMAL HIGH (ref 30.0–36.0)
MCV: 104.1 fL — AB (ref 78.0–100.0)
Platelets: 185 10*3/uL (ref 150–400)
RBC: 2.91 MIL/uL — AB (ref 3.87–5.11)
RDW: 13.1 % (ref 11.5–15.5)
WBC: 8.9 10*3/uL (ref 4.0–10.5)

## 2013-06-10 LAB — MAGNESIUM: Magnesium: 1.4 mg/dL — ABNORMAL LOW (ref 1.5–2.5)

## 2013-06-10 MED ORDER — SODIUM CHLORIDE 0.9 % IV SOLN: INTRAVENOUS | Status: DC

## 2013-06-10 MED ORDER — SODIUM CHLORIDE (HYPERTONIC) 5 % OP SOLN
1.0000 [drp] | Freq: Three times a day (TID) | OPHTHALMIC | Status: DC
  Administered 2013-06-10 – 2013-06-12 (×7): 1 [drp] via OPHTHALMIC
  Filled 2013-06-10: qty 15

## 2013-06-10 MED ORDER — BOOST PLUS PO LIQD
237.0000 mL | Freq: Two times a day (BID) | ORAL | Status: DC
  Administered 2013-06-11 – 2013-06-12 (×3): 237 mL via ORAL
  Filled 2013-06-10 (×5): qty 237

## 2013-06-10 MED ORDER — ACETAMINOPHEN 325 MG PO TABS: 650.0000 mg | ORAL_TABLET | Freq: Four times a day (QID) | ORAL | Status: DC | PRN

## 2013-06-10 MED ORDER — VITAMIN B-12 1000 MCG PO TABS
1000.0000 ug | ORAL_TABLET | Freq: Every day | ORAL | Status: DC
  Administered 2013-06-10 – 2013-06-12 (×3): 1000 ug via ORAL
  Filled 2013-06-10 (×3): qty 1

## 2013-06-10 MED ORDER — ONDANSETRON HCL 4 MG PO TABS: 4.0000 mg | ORAL_TABLET | Freq: Four times a day (QID) | ORAL | Status: DC | PRN

## 2013-06-10 MED ORDER — ASPIRIN EC 81 MG PO TBEC
81.0000 mg | DELAYED_RELEASE_TABLET | Freq: Every day | ORAL | Status: DC
  Administered 2013-06-10 – 2013-06-11 (×3): 81 mg via ORAL
  Filled 2013-06-10 (×4): qty 1

## 2013-06-10 MED ORDER — ENOXAPARIN SODIUM 40 MG/0.4ML ~~LOC~~ SOLN
40.0000 mg | SUBCUTANEOUS | Status: DC
  Administered 2013-06-10 – 2013-06-12 (×3): 40 mg via SUBCUTANEOUS
  Filled 2013-06-10 (×3): qty 0.4

## 2013-06-10 MED ORDER — ONDANSETRON HCL 4 MG/2ML IJ SOLN: 4.0000 mg | Freq: Four times a day (QID) | INTRAMUSCULAR | Status: DC | PRN

## 2013-06-10 MED ORDER — PAZOPANIB HCL 200 MG PO TABS: 800.0000 mg | ORAL_TABLET | Freq: Every day | ORAL | Status: DC

## 2013-06-10 MED ORDER — CHOLECALCIFEROL 10 MCG (400 UNIT) PO TABS
400.0000 [IU] | ORAL_TABLET | Freq: Every day | ORAL | Status: DC
  Administered 2013-06-10 – 2013-06-12 (×3): 400 [IU] via ORAL
  Filled 2013-06-10 (×3): qty 1

## 2013-06-10 MED ORDER — NADOLOL 40 MG PO TABS
40.0000 mg | ORAL_TABLET | Freq: Every day | ORAL | Status: DC
  Administered 2013-06-10 – 2013-06-11 (×3): 40 mg via ORAL
  Filled 2013-06-10 (×4): qty 1

## 2013-06-10 MED ORDER — PAZOPANIB HCL 200 MG PO TABS
800.0000 mg | ORAL_TABLET | Freq: Every day | ORAL | Status: DC
  Administered 2013-06-11 – 2013-06-12 (×2): 800 mg via ORAL
  Filled 2013-06-10 (×2): qty 4

## 2013-06-10 MED ORDER — LEVOTHYROXINE SODIUM 50 MCG PO TABS
50.0000 ug | ORAL_TABLET | Freq: Every day | ORAL | Status: DC
  Administered 2013-06-10 – 2013-06-12 (×3): 50 ug via ORAL
  Filled 2013-06-10 (×4): qty 1

## 2013-06-10 MED ORDER — PIPERACILLIN-TAZOBACTAM 3.375 G IVPB
3.3750 g | Freq: Three times a day (TID) | INTRAVENOUS | Status: DC
  Administered 2013-06-10 – 2013-06-12 (×8): 3.375 g via INTRAVENOUS
  Filled 2013-06-10 (×9): qty 50

## 2013-06-10 MED ORDER — DEXTROSE 5 % IV SOLN
1.0000 g | Freq: Once | INTRAVENOUS | Status: AC
  Administered 2013-06-10: 1 g via INTRAVENOUS
  Filled 2013-06-10: qty 1

## 2013-06-10 MED ORDER — ACETAMINOPHEN 650 MG RE SUPP: 650.0000 mg | Freq: Four times a day (QID) | RECTAL | Status: DC | PRN

## 2013-06-10 MED ORDER — VITAMINS A & D EX OINT
TOPICAL_OINTMENT | CUTANEOUS | Status: AC
  Administered 2013-06-10: 23:00:00
  Filled 2013-06-10: qty 5

## 2013-06-10 MED ORDER — POTASSIUM CHLORIDE IN NACL 20-0.9 MEQ/L-% IV SOLN
INTRAVENOUS | Status: DC
  Administered 2013-06-10 (×2): via INTRAVENOUS
  Filled 2013-06-10 (×3): qty 1000

## 2013-06-10 MED ORDER — ADULT MULTIVITAMIN W/MINERALS CH
1.0000 | ORAL_TABLET | Freq: Every day | ORAL | Status: DC
  Administered 2013-06-10 – 2013-06-12 (×3): 1 via ORAL
  Filled 2013-06-10 (×3): qty 1

## 2013-06-10 MED ORDER — POTASSIUM CHLORIDE 10 MEQ/100ML IV SOLN
10.0000 meq | INTRAVENOUS | Status: AC
  Administered 2013-06-10 (×3): 10 meq via INTRAVENOUS
  Filled 2013-06-10 (×3): qty 100

## 2013-06-10 MED ORDER — LOSARTAN POTASSIUM 50 MG PO TABS
50.0000 mg | ORAL_TABLET | Freq: Every day | ORAL | Status: DC
  Filled 2013-06-10: qty 1

## 2013-06-10 MED ORDER — AMLODIPINE BESYLATE 5 MG PO TABS
5.0000 mg | ORAL_TABLET | Freq: Every day | ORAL | Status: DC
  Administered 2013-06-10 – 2013-06-12 (×3): 5 mg via ORAL
  Filled 2013-06-10 (×3): qty 1

## 2013-06-10 NOTE — H&P (Signed)
History and Physical  MEGHNA HAGMANN LDJ:[id number] DOB: 11-24-1925 DOA: 06/09/2013   PCP: [name], MD   Chief Complaint: dysuria, N/V  HPI:  80 year old female with a history of TIA, renal cell carcinoma with right nephrectomy (09/02/10), hypertension, NHL, hypothyroidism, and frequent UTIs presents with two-day history of dysuria. The patient went to see her primary care provider 2 days ago. She was given a prescription for fosfomycin, but her pharmacy had difficulty obtaining it.  She did not get the abx until 06/09/13.  She took the fosfomycin after which she began having N/V.  She vomited up the medicine.  She continued to have dry heaves.  As a result she came to ED for further evaluation.  The patient's son is at the bedside supplementing the history.  He states that for the past 6 weeks, the patient is a recurrent UTIs with enterococcus. She has been treated with multiple antibiotics including amoxicillin, nitrofurantoin, Augmentin, and fosfomycin with recurrence.  The patient continued to have dysuria. The patient states that she had temperature of 99.78F at home. She continued to have dry heaves in the emergency department. She denies any abdominal pain, hematochezia, melena. She denies any dizziness, chest pain, shortness breath, palpitations. She states that her oral intake has been poor due to to her frequent UTIs in the past several weeks. In the emergency department, the patient was found to have a temperature 99.7. She was hemodynamically stable. Sodium was 125 potassium 3.0. He was on 500 cc normal saline and KCl 10 mEq. WBC was 10.8. Hepatic enzymes were negative.  Urinalysis showed TNTC WBC. Serum creatinine 0.61.  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 -will try to obtain culture data when office is open -for now start zosyn which will cover Enterococcus and Pseudomonas (she had previous urine culture with Pseudomonas) -if office cultures show Enterococcus is resistant to PCN, will need to use Vancomycin Hypertension -Continue amlodipine and nadolol -continue losartan without HCTZ due to hyponatremia and hypokalemia Renal cell carcinoma with metastasis to lung -Continue Votrient -placed on Dr. Worthy Flank consult list Hypothyroidism -Continue Synthroid         Past Medical History  Diagnosis Date  . Hypercholesteremia     takes Zocor daily  . Glaucoma   . Anemia   . Stroke     hx of TIA  . Blood transfusion   . Thrush 12/17/2010  . Renal cell carcinoma   . Non Hodgkin's lymphoma   . Cancer     kidney cancer, NHL   . Cancer     last chemo 11/04/10   . Hypertension     takes Nadolol daily  . Hypothyroidism     takes Synthroid daily  . Hypothyroid   . PONV (postoperative nausea and vomiting)   . Shortness of breath     with exertion  . Headache(784.0)     hx of migraines  . TIA (transient ischemic attack)   . Peripheral neuropathy   . Arthritis     knee /hip  . Scoliosis   . History of bladder infections   . History of blood transfusion   . Cataract     right  . History of shingles    Past  Surgical History  Procedure Laterality Date  . Nephrectomy      Right Side  . Portacath placement      right subclavian  . Tonsillectomy      as a child  . Mouth surgery    . Left cataract removed    . Video bronchoscopy with endobronchial ultrasound N/A 08/06/2012    Procedure: VIDEO BRONCHOSCOPY WITH ENDOBRONCHIAL ULTRASOUND;  Surgeon: Collene Gobble, MD;  Location: [place];  Service: Thoracic;  Laterality: N/A;  . Video bronchoscopy with endobronchial navigation N/A 08/06/2012    Procedure: VIDEO BRONCHOSCOPY WITH  ENDOBRONCHIAL NAVIGATION;  Surgeon: Collene Gobble, MD;  Location: [place];  Service: Thoracic;  Laterality: N/A;   Social History:  reports that she quit smoking about 21 years ago. She does not have any smokeless tobacco history on file. She reports that she does not drink alcohol or use illicit drugs.   Family History  Problem Relation Age of Onset  . Colon cancer Father   . Stroke Mother      Allergies  Allergen Reactions  . Crab [Shellfish Allergy] Nausea And Vomiting  . Dilaudid [Hydromorphone Hcl] Other (See Comments)    Hallucinations and nausea      Prior to Admission medications   Medication Sig Start Date End Date Taking? Authorizing Provider  amLODipine (NORVASC) 5 MG tablet Take 1 tablet (5 mg total) by mouth daily. 09/30/12  Yes Carlton Adam, PA-C  aspirin EC 81 MG tablet Take 81 mg by mouth at bedtime.    Yes Historical Provider, MD  Calcium Citrate (CITRACAL PO) Take 1 tablet by mouth 3 (three) times a week.    Yes Historical Provider, MD  cholecalciferol (VITAMIN D) 400 UNITS TABS Take 400 Units by mouth daily.     Yes Historical Provider, MD  levothyroxine (SYNTHROID, LEVOTHROID) 50 MCG tablet Take 50 mcg by mouth every morning.    Yes Historical Provider, MD  losartan-hydrochlorothiazide (HYZAAR) 50-12.5 MG per tablet Take 1 tablet by mouth daily.   Yes Historical Provider, MD  Multiple Vitamin (MULITIVITAMIN WITH MINERALS) TABS Take 1 tablet by mouth daily.     Yes Historical Provider, MD  nadolol (CORGARD) 40 MG tablet Take 40 mg by mouth at bedtime.    Yes Historical Provider, MD  pazopanib (VOTRIENT) 200 MG tablet Take 4 tablets (800 mg total) by mouth daily. Take on an empty stomach. 08/30/12  Yes Curt Bears, MD  sodium chloride (MURO 128) 5 % ophthalmic solution Place 1 drop into the left eye 3 (three) times daily.   Yes Historical Provider, MD  vitamin B-12 (CYANOCOBALAMIN) 1000 MCG tablet Place 1,000 mcg under the tongue daily.    Yes Historical  Provider, MD    Review of Systems:  Constitutional:  No weight loss, night sweats Head&Eyes: No headache.  No vision loss.  No eye pain or scotoma ENT:  No Difficulty swallowing,Tooth/dental problems,Sore throat,   Cardio-vascular:  No chest pain, Orthopnea, PND, swelling in lower extremities,  dizziness, palpitations  GI:  No  abdominal pain, nausea, vomiting, diarrhea, loss of appetite, hematochezia, melena, heartburn, indigestion, Resp:  No shortness of breath with exertion or at rest. No cough. No coughing up of blood .No wheezing.No chest wall deformity  Skin:  no rash or lesions.  GU:  no change in color of urine, no urgency or frequency. No flank pain.  Musculoskeletal:  No joint pain or swelling. No decreased range of motion. No back pain.  Psych:  No  change in mood or affect. No depression or anxiety. Neurologic: No headache, no dysesthesia, no focal weakness, no vision loss. No syncope  Physical Exam: Filed Vitals:   06/09/13 2156 06/09/13 2231 06/10/13 0026  BP: 138/71 141/75 121/62  Pulse: 75 79 77  Temp: 98.4 F (36.9 C) 99.7 F (37.6 C) 99.2 F (37.3 C)  TempSrc: Oral Oral Oral  Resp: 20 16 16   SpO2: 97% 96% 94%   General:  A&O x 3, NAD, nontoxic, pleasant/cooperative Head/Eye: No conjunctival hemorrhage, no icterus, [place]/AT, No nystagmus ENT:  No icterus,  No thrush, poor dentition, no pharyngeal exudate Neck:  No masses, no lymphadenpathy, no bruits CV:  RRR, no rub, no gallop, no S3 Lung:  CTAB, good air movement, no wheeze, no rhonchi Abdomen: soft/NT, +BS, nondistended, no peritoneal signs Ext: No cyanosis, No rashes, No petechiae, No lymphangitis, 1+LE edema bilateral   Labs on Admission:  Basic Metabolic Panel:  Recent Labs Lab 06/09/13 2230  NA 125*  K 3.0*  CL 83*  CO2 26  GLUCOSE 105*  BUN 11  CREATININE 0.61  CALCIUM 9.4   Liver Function Tests:  Recent Labs Lab 06/09/13 2230  AST 23  ALT 17  ALKPHOS 94  BILITOT 0.9    PROT 6.5  ALBUMIN 3.7   No results found for this basename: LIPASE, AMYLASE,  in the last 168 hours No results found for this basename: AMMONIA,  in the last 168 hours CBC:  Recent Labs Lab 06/09/13 2222  WBC 10.8*  NEUTROABS 9.6*  HGB 13.7  HCT 37.5  MCV 105.3*  PLT 221   Cardiac Enzymes: No results found for this basename: CKTOTAL, CKMB, CKMBINDEX, TROPONINI,  in the last 168 hours BNP: No components found with this basename: POCBNP,  CBG: No results found for this basename: GLUCAP,  in the last 168 hours  Radiological Exams on Admission: No results found.      Time spent:60 minutes Code Status:   FULL Family Communication:   Son at bedside   Orson Eva, DO  [facility] Pager [phone]  If 7PM-7AM, please contact night-coverage [email or web address] Password [email or web address] 06/10/2013, 12:45 AM

## 2013-06-10 NOTE — Progress Notes (Signed)
MEDICATION RELATED CONSULT NOTE - INITIAL   Pharmacy Consult for Tat Indication: patient on pazopanib (VOTRIENT)   Allergies  Allergen Reactions  . Crab [Shellfish Allergy] Nausea And Vomiting  . Dilaudid [Hydromorphone Hcl] Other (See Comments)    Hallucinations and nausea    Patient Measurements: Height: 5\' 3"  (160 cm) Weight: 114 lb 8 oz (51.937 kg) IBW/kg (Calculated) : 52.4 Adjusted Body Weight:   Vital Signs: Temp: 97.7 F (36.5 C) (06/05 0513) Temp src: Oral (06/05 0513) BP: 115/62 mmHg (06/05 0513) Pulse Rate: 70 (06/05 0513) Intake/Output from previous day: 06/04 0701 - 06/05 0700 In: -  Out: 400 [Urine:400] Intake/Output from this shift: Total I/O In: -  Out: 400 [Urine:400]  Labs:  Recent Labs  06/09/13 2222 06/09/13 2230 06/10/13 0348  WBC 10.8*  --  8.9  HGB 13.7  --  11.0*  HCT 37.5  --  30.3*  PLT 221  --  185  CREATININE  --  0.61 0.65  MG  --   --  1.4*  ALBUMIN  --  3.7  --   PROT  --  6.5  --   AST  --  23  --   ALT  --  17  --   ALKPHOS  --  94  --   BILITOT  --  0.9  --   BILIDIR  --  <0.2  --   IBILI  --  NOT CALCULATED  --    Estimated Creatinine Clearance: 42.1 ml/min (by C-G formula based on Cr of 0.65).   Microbiology: No results found for this or any previous visit (from the past 720 hour(s)).  Medical History: Past Medical History  Diagnosis Date  . Hypercholesteremia     takes Zocor daily  . Glaucoma   . Anemia   . Stroke     hx of TIA  . Blood transfusion   . Thrush 12/17/2010  . Renal cell carcinoma   . Non Hodgkin's lymphoma   . Cancer     kidney cancer, NHL   . Cancer     last chemo 11/04/10   . Hypertension     takes Nadolol daily  . Hypothyroidism     takes Synthroid daily  . Hypothyroid   . PONV (postoperative nausea and vomiting)   . Shortness of breath     with exertion  . Headache(784.0)     hx of migraines  . TIA (transient ischemic attack)   . Peripheral neuropathy   . Arthritis    knee /hip  . Scoliosis   . History of bladder infections   . History of blood transfusion   . Cataract     right  . History of shingles     Medications:  Prescriptions prior to admission  Medication Sig Dispense Refill  . amLODipine (NORVASC) 5 MG tablet Take 1 tablet (5 mg total) by mouth daily.  30 tablet  0  . aspirin EC 81 MG tablet Take 81 mg by mouth at bedtime.       . Calcium Citrate (CITRACAL PO) Take 1 tablet by mouth 3 (three) times a week.       . cholecalciferol (VITAMIN D) 400 UNITS TABS Take 400 Units by mouth daily.        [name] levothyroxine (SYNTHROID, LEVOTHROID) 50 MCG tablet Take 50 mcg by mouth every morning.       [name] losartan-hydrochlorothiazide (HYZAAR) 50-12.5 MG per tablet Take 1 tablet by mouth daily.      [name]  Multiple Vitamin (MULITIVITAMIN WITH MINERALS) TABS Take 1 tablet by mouth daily.        . nadolol (CORGARD) 40 MG tablet Take 40 mg by mouth at bedtime.       . pazopanib (VOTRIENT) 200 MG tablet Take 4 tablets (800 mg total) by mouth daily. Take on an empty stomach.  120 tablet  2  . sodium chloride (MURO 128) 5 % ophthalmic solution Place 1 drop into the left eye 3 (three) times daily.      . vitamin B-12 (CYANOCOBALAMIN) 1000 MCG tablet Place 1,000 mcg under the tongue daily.         Assessment: Patient with active infection, UTI.   Goal of Therapy:  Pazopanib (Votrient) hold criteria  AST > 3x ULN  Bili > 2x ULN  Acute arterial or venous thromboembolic event  Gastrointestinal perforation  Hemorrhage  Infection  New or more frequent seizures or visual disturbances  New or worsened CHF  Uncontrolled hypertension   Plan:  Will d/c medication at this time. Will follow to see plan for patient.  Diannia Hogenson [employer]. 06/10/2013,5:49 AM

## 2013-06-10 NOTE — Progress Notes (Signed)
INITIAL NUTRITION ASSESSMENT  DOCUMENTATION CODES Per approved criteria  -Non-severe (moderate) malnutrition in the context of chronic illness  Pt meets criteria for moderate MALNUTRITION in the context of chronic illness as evidenced by PO intake <75% for > one month, moderate muscle wasting in temporal region   INTERVENTION: -Recommend Boost Plus BID -Encouraged intake of soft high kcal/protein foods -Provided pt with nutrition education handouts and supplement coupons -Will continue to monitor  NUTRITION DIAGNOSIS: Inadequate oral intake related to taste changes as evidenced by PO intake <75%.   Goal: Pt to meet >/= 90% of their estimated nutrition needs    Monitor:  Total protein/energy intake, labs, weights, edu needs  Reason for Assessment: MST  80 y.o. female  Admitting Dx: UTI (lower urinary tract infection)  ASSESSMENT: 80 year old female with a history of TIA, renal cell carcinoma with right nephrectomy (09/02/10), hypertension, NHL, hypothyroidism, and frequent UTIs presents with two-day history of dysuria.  -Pt reported decreased appetite d/t taste changes r/t oral chemotherapy agent. PO intake currently 50% -Diet recall indicates pt skips breakfast, and consumes frozen prepared dinners (fish/salsibury steak). Will occasionally eat eggs or yogurt for breakfast once or twice/wk. Drinks Boost/Ensure once daily for past two weeks. -Pt also with dental issues, has been tolerating softer foods. Is on regular diet, and is able to pick choose she is aware that she can tolerate. Reviewed soft high protein choices -Weight has remained stable around 112-114 lbs. Lost 30 lbs two years ago with cancer dx, but has since been able to maintain weight -Provided pt with nutrition education handouts for taste changes (adding citrus/seasonings/vinegar/mouth rinses) -Provided pt with supplement coupons to assist in supplement compliance upon d/c -Hyponatremia r/t dehydration upon admit,  improving with IVF -Low K-improved upon replacement   Height: Ht Readings from Last 1 Encounters:  06/10/13 5\' 3"  (1.6 m)    Weight: Wt Readings from Last 1 Encounters:  06/10/13 114 lb 8 oz (51.937 kg)    Ideal Body Weight: 115%  % Ideal Body Weight: 99%  Wt Readings from Last 10 Encounters:  06/10/13 114 lb 8 oz (51.937 kg)  05/12/13 111 lb 14.4 oz (50.758 kg)  04/06/13 114 lb (51.71 kg)  03/09/13 113 lb 9.6 oz (51.529 kg)  02/09/13 113 lb 11.2 oz (51.574 kg)  01/10/13 115 lb 1.6 oz (52.209 kg)  12/10/12 115 lb 12.8 oz (52.527 kg)  11/16/12 115 lb 6.4 oz (52.345 kg)  10/18/12 115 lb 12.8 oz (52.527 kg)  09/30/12 116 lb 3.2 oz (52.708 kg)    Usual Body Weight: 112-114 lbs  % Usual Body Weight: 100%  BMI:  Body mass index is 20.29 kg/(m^2).  Estimated Nutritional Needs: Kcal: 1400-1600 Protein: 65-80 gram Fluid: >/=1600 ml/daily  Skin: WDL  Diet Order: General  EDUCATION NEEDS: -Education needs addressed   Intake/Output Summary (Last 24 hours) at 06/10/13 1446 Last data filed at 06/10/13 1300  Gross per 24 hour  Intake    360 ml  Output   1600 ml  Net  -1240 ml    Last BM: 6/02-monitor need for stool softener   Labs:   Recent Labs Lab 06/09/13 2230 06/10/13 0348 06/10/13 0840  NA 125* 128* 132*  K 3.0* 3.8 4.2  CL 83* 90* 95*  CO2 26 25 26   BUN 11 9 8   CREATININE 0.61 0.65 0.76  CALCIUM 9.4 8.4 8.5  MG  --  1.4*  --   GLUCOSE 105* 106* 84    CBG (last 3)  No results found for this basename: GLUCAP,  in the last 72 hours  Scheduled Meds: . amLODipine  5 mg Oral Daily  . aspirin EC  81 mg Oral QHS  . cholecalciferol  400 Units Oral Daily  . enoxaparin (LOVENOX) injection  40 mg Subcutaneous Q24H  . lactose free nutrition  237 mL Oral BID BM  . levothyroxine  50 mcg Oral QAC breakfast  . multivitamin with minerals  1 tablet Oral Daily  . nadolol  40 mg Oral QHS  . piperacillin-tazobactam  3.375 g Intravenous 3 times per day  .  sodium chloride  1 drop Left Eye TID  . vitamin B-12  1,000 mcg Oral Daily    Continuous Infusions: . 0.9 % NaCl with KCl 20 mEq / L 75 mL/hr at 06/10/13 9892    Past Medical History  Diagnosis Date  . Hypercholesteremia     takes Zocor daily  . Glaucoma   . Anemia   . Stroke     hx of TIA  . Blood transfusion   . Thrush 12/17/2010  . Renal cell carcinoma   . Non Hodgkin's lymphoma   . Cancer     kidney cancer, NHL   . Cancer     last chemo 11/04/10   . Hypertension     takes Nadolol daily  . Hypothyroidism     takes Synthroid daily  . Hypothyroid   . PONV (postoperative nausea and vomiting)   . Shortness of breath     with exertion  . Headache(784.0)     hx of migraines  . TIA (transient ischemic attack)   . Peripheral neuropathy   . Arthritis     knee /hip  . Scoliosis   . History of bladder infections   . History of blood transfusion   . Cataract     right  . History of shingles     Past Surgical History  Procedure Laterality Date  . Nephrectomy      Right Side  . Portacath placement      right subclavian  . Tonsillectomy      as a child  . Mouth surgery    . Left cataract removed    . Video bronchoscopy with endobronchial ultrasound N/A 08/06/2012    Procedure: VIDEO BRONCHOSCOPY WITH ENDOBRONCHIAL ULTRASOUND;  Surgeon: Collene Gobble, MD;  Location: [place];  Service: Thoracic;  Laterality: N/A;  . Video bronchoscopy with endobronchial navigation N/A 08/06/2012    Procedure: VIDEO BRONCHOSCOPY WITH ENDOBRONCHIAL NAVIGATION;  Surgeon: Collene Gobble, MD;  Location: [place];  Service: Thoracic;  Laterality: N/A;    Atlee Abide MS RD LDN Clinical Dietitian [phone]

## 2013-06-10 NOTE — Progress Notes (Signed)
06/10/13 1500  PT Visit Information  Last PT Received On 06/10/13  Reason Eval/Treat Not Completed Patient declined, no reason specified (tired, politely refused)

## 2013-06-10 NOTE — Progress Notes (Signed)
Melissa Cross   DOB:Nov 27, 1925   IH#:[id number]   OVF#:[id number]  Subjective:  This is a very pleasant 80 years old white female with metastatic renal cell carcinoma. She is currently on treatment with Votrient 800 mg by mouth daily status post 9 months of treatment which was well tolerated. She presented with a 2 day history of dysuria in the setting of recurrent enterococcus and pseudomonas UTI for which she received fosfomycin which apparently was unable to tolerate well, complicated with nausea and vomiting, fevers and chills. Prior to that she had been treated with multiple different regimens without resolution. On admission,  IV Zosyn was initiated.  Rest of the review of systems is negative. We have been kindly informed of Ms. Melissa Cross's admission.   Objective:  Filed Vitals:   06/10/13 0513  BP: 115/62  Pulse: 70  Temp: 97.7 F (36.5 C)  Resp: 18    Body mass index is 20.29 kg/(m^2).  Intake/Output Summary (Last 24 hours) at 06/10/13 0956 Last data filed at 06/10/13 0646  Gross per 24 hour  Intake      0 ml  Output   1200 ml  Net  -1200 ml     GENERAL:alert, no distress and comfortable, very thin.  SKIN: skin color, texture, turgor are normal, no rashes or significant lesions EYES: normal, conjunctiva are pink and non-injected, sclera clear OROPHARYNX:no exudate, no erythema and lips, buccal mucosa, and tongue normal  NECK: supple, thyroid normal size, non-tender, without nodularity LYMPH:  no palpable lymphadenopathy in the cervical, axillary or inguinal LUNGS: clear to auscultation and percussion with normal breathing effort HEART: regular rate & rhythm and no murmurs and 1+ bilateral lower extremity edema ABDOMEN:abdomen soft, non-tender and normal bowel sounds Musculoskeletal:no cyanosis of digits and no clubbing  PSYCH: alert & oriented x 3 with fluent speech NEURO: no focal motor/sensory deficits    CBG (last 3)  No results found for this basename: GLUCAP,  in the  last 72 hours   Labs:   Recent Labs Lab 06/09/13 2222 06/10/13 0348  WBC 10.8* 8.9  HGB 13.7 11.0*  HCT 37.5 30.3*  PLT 221 185  MCV 105.3* 104.1*  MCH 38.5* 37.8*  MCHC 36.5* 36.3*  RDW 13.2 13.1  LYMPHSABS 0.4*  --   MONOABS 0.7  --   EOSABS 0.0  --   BASOSABS 0.0  --      Chemistries:    Recent Labs Lab 06/09/13 2230 06/10/13 0348 06/10/13 0840  NA 125* 128* 132*  K 3.0* 3.8 4.2  CL 83* 90* 95*  CO2 26 25 26   GLUCOSE 105* 106* 84  BUN 11 9 8   CREATININE 0.61 0.65 0.76  CALCIUM 9.4 8.4 8.5  MG  --  1.4*  --   AST 23  --   --   ALT 17  --   --   ALKPHOS 94  --   --   BILITOT 0.9  --   --     GFR Estimated Creatinine Clearance: 42.1 ml/min (by C-G formula based on Cr of 0.76).  Liver Function Tests:  Recent Labs Lab 06/09/13 2230  AST 23  ALT 17  ALKPHOS 94  BILITOT 0.9  PROT 6.5  ALBUMIN 3.7   Urine Studies     Component Value Date/Time   COLORURINE ORANGE* 06/09/2013 2328   APPEARANCEUR CLOUDY* 06/09/2013 2328   LABSPEC 1.011 06/09/2013 2328   LABSPEC 1.005 11/01/2010 1054   PHURINE 7.5 06/09/2013 2328   GLUCOSEU NEGATIVE  06/09/2013 2328   HGBUR TRACE* 06/09/2013 [place] 06/09/2013 [place] 06/09/2013 2328   PROTEINUR 30* 06/09/2013 2328   UROBILINOGEN 2.0* 06/09/2013 2328   NITRITE POSITIVE* 06/09/2013 2328   LEUKOCYTESUR LARGE* 06/09/2013 2328      Imaging Studies:  No results found.  Assessment/Plan: 80 y.o. with   1. metastatic renal cell carcinoma initially diagnosed as stage III (T3a N0M0) renal cell carcinoma diagnosed in June of 2012 with pulmonary metastasis diagnosed in August of 2014. Currently on Votrient 800 mg by mouth daily. Status post 9 months of treatment.Most recent scan of the chest, abdomen and pelvis showed no evidence for disease progression or recurrent disease. To continue med while in hospital  2. History of non-Hodgkin's lymphoma Systemic chemotherapy with CHOP/Rituxan  Stable  3.  Enterococcus UTI On Zosyn IV and IVF As per Pharmacy  4. Full Code  Other medical issues as per admitting team.     **Disclaimer: This note was dictated with voice recognition software. Similar sounding words can inadvertently be transcribed and this note may contain transcription errors which may not have been corrected upon publication of note.Rondel Jumbo, PA-C 06/10/2013  9:56 AM  ADDENDUM: Hematology/Oncology Attending: The patient is seen and examined today. I agree with the above note. She is a very pleasant 80 years old white female with history of metastatic renal cell carcinoma in addition to history of stage IV non-Hodgkin lymphoma. She is currently on treatment with Votrient 800 mg by mouth daily status post 9 months of treatment with good response. She is tolerating her treatment fairly well with no significant adverse effects. The patient was admitted yesterday with recurrent urinary tract infection and urine culture showed enterococcus and Pseudomonas urinary tract effect. She was started on treatment by her primary care physician with fosfomycin but unfortunately the patient was unable to tolerate it with nausea and vomiting in addition to poor appetite and dehydration. She was admitted yesterday and started on IV hydration and Zosyn. She is feeling much better today. She will continue her current antibiotics for now. I also recommended for the patient to continue her current treatment with Votrient. I would see her back for followup visit after discharge from the hospital for evaluation as previously scheduled. Thank you for taking good care of Ms. Melissa Cross, I will continue to follow the patient with you and assist in her management an as-needed basis.

## 2013-06-10 NOTE — Progress Notes (Signed)
Patient ID: Melissa Cross  female  MWN:[id number]    DOB: 1925-04-23    DOA: 06/09/2013  PCP: Melissa Coma, MD  Assessment/Plan: Principal Problem:   UTI (lower urinary tract infection): History of recurrent UTIs - Patient currently placed on Fortaz and Zosyn. I called Dr. Myer Cross office and personally discussed with her, per Dr. Dannial Cross, urine culture and sensitivities are not back yet. Dr. Dannial Cross has my contact number and will contact me as soon as the urine culture and sensitivities are available.    Active Problems:   Lymphoma, large-cell, diffuse - Oncology following, appreciate assistance    Renal cell carcinoma - Oncology following, will follow recommendations    Hyponatremia: Improving, likely due to dehydration    Hypokalemia: Resolved    Unspecified essential hypertension -Currently stable   deconditioning - PTOT evaluation  DVT Prophylaxis:  Code Status:  Family Communication:  Disposition:  Consultants:  None   Procedures:  None   Antibiotics:  IV Fortaz  IV Zosyn   Subjective: Patient seen and examined, no specific complaints, no chest pain, shortness breath, fevers or chills, pleasant   Objective: Weight change:   Intake/Output Summary (Last 24 hours) at 06/10/13 1220 Last data filed at 06/10/13 0800  Gross per 24 hour  Intake    240 ml  Output   1600 ml  Net  -1360 ml   Blood pressure 115/62, pulse 70, temperature 97.7 F (36.5 C), temperature source Oral, resp. rate 18, height 5\' 3"  (1.6 m), weight 51.937 kg (114 lb 8 oz), SpO2 98.00%.  Physical Exam: General: Alert and awake, oriented x3, not in any acute distress. CVS: S1-S2 clear, no murmur rubs or gallops Chest: clear to auscultation bilaterally Abdomen: soft nontender, nondistended, normal bowel sounds  Extremities: no cyanosis, clubbing or edema noted bilaterally Neuro: Cranial nerves II-XII intact, no focal neurological deficits  Lab Results: Basic Metabolic  Panel:  Recent Labs Lab 06/10/13 0348 06/10/13 0840  NA 128* 132*  K 3.8 4.2  CL 90* 95*  CO2 25 26  GLUCOSE 106* 84  BUN 9 8  CREATININE 0.65 0.76  CALCIUM 8.4 8.5  MG 1.4*  --    Liver Function Tests:  Recent Labs Lab 06/09/13 2230  AST 23  ALT 17  ALKPHOS 94  BILITOT 0.9  PROT 6.5  ALBUMIN 3.7   No results found for this basename: LIPASE, AMYLASE,  in the last 168 hours No results found for this basename: AMMONIA,  in the last 168 hours CBC:  Recent Labs Lab 06/09/13 2222 06/10/13 0348  WBC 10.8* 8.9  NEUTROABS 9.6*  --   HGB 13.7 11.0*  HCT 37.5 30.3*  MCV 105.3* 104.1*  PLT 221 185   Cardiac Enzymes: No results found for this basename: CKTOTAL, CKMB, CKMBINDEX, TROPONINI,  in the last 168 hours BNP: No components found with this basename: POCBNP,  CBG: No results found for this basename: GLUCAP,  in the last 168 hours   Micro Results: No results found for this or any previous visit (from the past 240 hour(s)).  Studies/Results: No results found.  Medications: Scheduled Meds: . amLODipine  5 mg Oral Daily  . aspirin EC  81 mg Oral QHS  . cholecalciferol  400 Units Oral Daily  . enoxaparin (LOVENOX) injection  40 mg Subcutaneous Q24H  . levothyroxine  50 mcg Oral QAC breakfast  . multivitamin with minerals  1 tablet Oral Daily  . nadolol  40 mg Oral QHS  . piperacillin-tazobactam  3.375 g Intravenous 3 times per day  . sodium chloride  1 drop Left Eye TID  . vitamin B-12  1,000 mcg Oral Daily      LOS: 1 day   Melissa Cross Melissa Cross M.D. [facility] 06/10/2013, 12:20 PM Pager: [phone]  If 7PM-7AM, please contact night-coverage [email or web address] Password [email or web address]  **Disclaimer: This note was dictated with voice recognition software. Similar sounding words can inadvertently be transcribed and this note may contain transcription errors which may not have been corrected upon publication of note.**

## 2013-06-10 NOTE — Care Management Note (Signed)
  Page 1 of 1   06/10/2013     11:06:56 AM CARE MANAGEMENT NOTE 06/10/2013  Patient:  Melissa Cross, Melissa Cross   Account Number:  [id number]  Date Initiated:  06/10/2013  Documentation initiated by:  Sunday Spillers  Subjective/Objective Assessment:   80 yo female admitted with UTI, failed OP treatment. PTA lived at home alone. PMH renal cell carcinoma with right nephrectomy     Action/Plan:   Home when stable.   Anticipated DC Date:  06/13/2013   Anticipated DC Plan:  [place]  CM consult      Choice offered to / List presented to:             Status of service:  In process, will continue to follow Medicare Important Message given?   (If response is "NO", the following Medicare IM given date fields will be blank) Date Medicare IM given:   Date Additional Medicare IM given:    Discharge Disposition:    Per UR Regulation:  Reviewed for med. necessity/level of care/duration of stay  If discussed at [place] of Stay Meetings, dates discussed:    Comments:

## 2013-06-11 DIAGNOSIS — E440 Moderate protein-calorie malnutrition: Secondary | ICD-10-CM

## 2013-06-11 LAB — BASIC METABOLIC PANEL
BUN: 6 mg/dL (ref 6–23)
CHLORIDE: 103 meq/L (ref 96–112)
CO2: 25 meq/L (ref 19–32)
CREATININE: 0.76 mg/dL (ref 0.50–1.10)
Calcium: 8.4 mg/dL (ref 8.4–10.5)
GFR calc Af Amer: 87 mL/min — ABNORMAL LOW (ref >90)
GFR calc non Af Amer: 75 mL/min — ABNORMAL LOW (ref >90)
Glucose, Bld: 85 mg/dL (ref 70–99)
Potassium: 3.9 mEq/L (ref 3.7–5.3)
Sodium: 138 mEq/L (ref 137–147)

## 2013-06-11 NOTE — Evaluation (Signed)
Physical Therapy Evaluation Patient Details Name: Melissa Cross MRN: [id number] DOB: 1925/12/11 Today's Date: 06/11/2013   History of Present Illness  80 y.o. female admitted with UTI  Clinical Impression  *Pt admitted with UTI**. Pt currently with functional limitations due to the deficits listed below (see PT Problem List).  Pt will benefit from skilled PT to increase their independence and safety with mobility to allow discharge to the venue listed below.   **    Follow Up Recommendations No PT follow up    Equipment Recommendations  None recommended by PT    Recommendations for Other Services       Precautions / Restrictions Precautions Precautions: None Restrictions Weight Bearing Restrictions: No      Mobility  Bed Mobility                  Transfers Overall transfer level: Modified independent Equipment used: Straight cane                Ambulation/Gait Ambulation/Gait assistance: Supervision Ambulation Distance (Feet): 150 Feet Assistive device: Straight cane Gait Pattern/deviations: Decreased stride length   Gait velocity interpretation: at or above normal speed for age/gender General Gait Details: steady with SPC, pt notes fatigue in BLEs with walking, states her legs feel weaker than normal, no buckling, she declined trial of RW  Stairs            Wheelchair Mobility    Modified Rankin (Stroke Patients Only)       Balance Overall balance assessment: Modified Independent                                           Pertinent Vitals/Pain **0/10 pain*    Home Living Family/patient expects to be discharged to:: Private residence Living Arrangements: Alone Available Help at Discharge: Neighbor;Available PRN/intermittently   Home Access: Stairs to enter Entrance Stairs-Rails: Right Entrance Stairs-Number of Steps: 5 Home Layout: Two level;Able to live on main level with bedroom/bathroom Home Equipment: Cane -  single point;Grab bars - tub/shower      Prior Function Level of Independence: Independent with assistive device(s)         Comments: uses SPC when going out, doesn't drive bc of eyesight, neighbor takes her to get groceries, son takes her to Dr appointments, can do her own meals     Hand Dominance        Extremity/Trunk Assessment   Upper Extremity Assessment: Overall WFL for tasks assessed           Lower Extremity Assessment: Overall WFL for tasks assessed      Cervical / Trunk Assessment: Kyphotic  Communication   Communication: No difficulties  Cognition Arousal/Alertness: Awake/alert Behavior During Therapy: WFL for tasks assessed/performed Overall Cognitive Status: Within Functional Limits for tasks assessed                      General Comments      Exercises General Exercises - Lower Extremity Ankle Circles/Pumps: AROM;Both;10 reps;Seated Long Arc Quad: AROM;Both;10 reps;Seated Hip Flexion/Marching: AROM;Both;10 reps;Seated      Assessment/Plan    PT Assessment Patient needs continued PT services  PT Diagnosis Generalized weakness   PT Problem List Decreased mobility;Decreased activity tolerance  PT Treatment Interventions Gait training;Stair training;Functional mobility training;Therapeutic activities;Patient/family education;Therapeutic exercise   PT Goals (Current goals can be found in the Care  Plan section) Acute Rehab PT Goals Patient Stated Goal: to get her legs stronger PT Goal Formulation: With patient Time For Goal Achievement: 06/25/13 Potential to Achieve Goals: Good    Frequency Min 3X/week   Barriers to discharge        Co-evaluation               End of Session Equipment Utilized During Treatment: Gait belt Activity Tolerance: Patient tolerated treatment well Patient left: in chair;with call bell/phone within reach Nurse Communication: Mobility status         Time: 1044-1101 PT Time Calculation (min):  17 min   Charges:   PT Evaluation $Initial PT Evaluation Tier I: 1 Procedure PT Treatments $Gait Training: 8-22 mins   PT G CodesLucile Crater 06/11/2013, 11:07 AM [phone]

## 2013-06-11 NOTE — Progress Notes (Addendum)
Patient ID: Melissa Cross  female  [id number]:[id number]    DOB: 1925/10/03    DOA: 06/09/2013  PCP: [name], MD  Assessment/Plan: Principal Problem:   UTI (lower urinary tract infection): History of recurrent UTIs - Patient currently placed on Fortaz and Zosyn. - Blood cultures negative to date urine culture showed more than 100,000 colonies of gram-negative rods follow sensitivities,     Active Problems:   Lymphoma, large-cell, diffuse - Oncology following, appreciate assistance    Renal cell carcinoma - Oncology following, will follow recommendations, Continue oral chemotherapy per oncology    Hyponatremia: Improving, likely due to dehydration    Hypokalemia: Resolved    Unspecified essential hypertension -Currently stable   deconditioning - PTOT evaluation  DVT Prophylaxis:  Code Status:  Family Communication: called patient's sons, [name] and William Laske,  unfortunately unable to contact both of them. I left a detailed message on Mr. Bebe Liter phone    Disposition:  Consultants:  None   Procedures:  None   Antibiotics:  IV Tressie Ellis  IV Zosyn   Subjective: Patient seen and examined,  feels a whole lot better today, no fevers or chills   Objective: Weight change:   Intake/Output Summary (Last 24 hours) at 06/11/13 1007 Last data filed at 06/11/13 0946  Gross per 24 hour  Intake 3002.5 ml  Output    300 ml  Net 2702.5 ml   Blood pressure 113/60, pulse 63, temperature 99 F (37.2 C), temperature source Oral, resp. rate 16, height 5\' 3"  (1.6 m), weight 51.937 kg (114 lb 8 oz), SpO2 97.00%.  Physical Exam: General: Alert and awake, oriented x3, not in any acute distress. CVS: S1-S2 clear, no murmur rubs or gallops Chest: clear to auscultation bilaterally Abdomen: soft nontender, nondistended, normal bowel sounds  Extremities: no cyanosis, clubbing or edema noted bilaterally   Lab Results: Basic Metabolic Panel:  Recent Labs Lab 06/10/13 0348  06/10/13 0840 06/11/13 0504  NA 128* 132* 138  K 3.8 4.2 3.9  CL 90* 95* 103  CO2 25 26 25   GLUCOSE 106* 84 85  BUN 9 8 6   CREATININE 0.65 0.76 0.76  CALCIUM 8.4 8.5 8.4  MG 1.4*  --   --    Liver Function Tests:  Recent Labs Lab 06/09/13 2230  AST 23  ALT 17  ALKPHOS 94  BILITOT 0.9  PROT 6.5  ALBUMIN 3.7   No results found for this basename: LIPASE, AMYLASE,  in the last 168 hours No results found for this basename: AMMONIA,  in the last 168 hours CBC:  Recent Labs Lab 06/09/13 2222 06/10/13 0348  WBC 10.8* 8.9  NEUTROABS 9.6*  --   HGB 13.7 11.0*  HCT 37.5 30.3*  MCV 105.3* 104.1*  PLT 221 185   Cardiac Enzymes: No results found for this basename: CKTOTAL, CKMB, CKMBINDEX, TROPONINI,  in the last 168 hours BNP: No components found with this basename: POCBNP,  CBG: No results found for this basename: GLUCAP,  in the last 168 hours   Micro Results: Recent Results (from the past 240 hour(s))  URINE CULTURE     Status: None   Collection Time    06/09/13 11:28 PM      Result Value Ref Range Status   Specimen Description URINE, CLEAN CATCH   Final   Special Requests NONE   Final   Culture  Setup Time     Final   Value: 06/10/2013 05:11     Performed at  [facility] Count     Final   Value: >=100,000 COLONIES/ML     Performed at [facility]     Final   Value: GRAM NEGATIVE RODS     Performed at [facility]   Report Status PENDING   Incomplete    Studies/Results: No results found.  Medications: Scheduled Meds: . amLODipine  5 mg Oral Daily  . aspirin EC  81 mg Oral QHS  . cholecalciferol  400 Units Oral Daily  . enoxaparin (LOVENOX) injection  40 mg Subcutaneous Q24H  . lactose free nutrition  237 mL Oral BID BM  . levothyroxine  50 mcg Oral QAC breakfast  . multivitamin with minerals  1 tablet Oral Daily  . nadolol  40 mg Oral QHS  . pazopanib  800 mg Oral Daily  . piperacillin-tazobactam  3.375  g Intravenous 3 times per day  . sodium chloride  1 drop Left Eye TID  . vitamin B-12  1,000 mcg Oral Daily      LOS: 2 days   Dariel Betzer Krystal Eaton M.D. [facility] 06/11/2013, 10:07 AM Pager: [phone]  If 7PM-7AM, please contact night-coverage [email or web address] Password [email or web address]  **Disclaimer: This note was dictated with voice recognition software. Similar sounding words can inadvertently be transcribed and this note may contain transcription errors which may not have been corrected upon publication of note.**

## 2013-06-12 LAB — CBC
HCT: 33.1 % — ABNORMAL LOW (ref 36.0–46.0)
Hemoglobin: 11.4 g/dL — ABNORMAL LOW (ref 12.0–15.0)
MCH: 37.5 pg — ABNORMAL HIGH (ref 26.0–34.0)
MCHC: 34.4 g/dL (ref 30.0–36.0)
MCV: 108.9 fL — AB (ref 78.0–100.0)
PLATELETS: 157 10*3/uL (ref 150–400)
RBC: 3.04 MIL/uL — ABNORMAL LOW (ref 3.87–5.11)
RDW: 13.7 % (ref 11.5–15.5)
WBC: 3.1 10*3/uL — ABNORMAL LOW (ref 4.0–10.5)

## 2013-06-12 LAB — BASIC METABOLIC PANEL
BUN: 5 mg/dL — ABNORMAL LOW (ref 6–23)
CALCIUM: 8.9 mg/dL (ref 8.4–10.5)
CO2: 25 meq/L (ref 19–32)
CREATININE: 0.79 mg/dL (ref 0.50–1.10)
Chloride: 98 mEq/L (ref 96–112)
GFR calc Af Amer: 86 mL/min — ABNORMAL LOW (ref >90)
GFR calc non Af Amer: 74 mL/min — ABNORMAL LOW (ref >90)
GLUCOSE: 87 mg/dL (ref 70–99)
Potassium: 3.4 mEq/L — ABNORMAL LOW (ref 3.7–5.3)
Sodium: 136 mEq/L — ABNORMAL LOW (ref 137–147)

## 2013-06-12 LAB — URINE CULTURE

## 2013-06-12 MED ORDER — PHENAZOPYRIDINE HCL 100 MG PO TABS: 100.0000 mg | ORAL_TABLET | Freq: Three times a day (TID) | ORAL | Status: DC

## 2013-06-12 MED ORDER — CIPROFLOXACIN HCL 100 MG PO TABS: 100.0000 mg | ORAL_TABLET | Freq: Every day | ORAL | Status: DC

## 2013-06-12 MED ORDER — URELLE 81 MG PO TABS: 1.0000 | ORAL_TABLET | Freq: Four times a day (QID) | ORAL | Status: DC | PRN

## 2013-06-12 MED ORDER — CIPROFLOXACIN HCL 500 MG PO TABS
500.0000 mg | ORAL_TABLET | Freq: Two times a day (BID) | ORAL | Status: DC
  Administered 2013-06-12: 500 mg via ORAL
  Filled 2013-06-12 (×3): qty 1

## 2013-06-12 MED ORDER — POTASSIUM CHLORIDE CRYS ER 20 MEQ PO TBCR
40.0000 meq | EXTENDED_RELEASE_TABLET | Freq: Every day | ORAL | Status: DC
  Administered 2013-06-12: 40 meq via ORAL
  Filled 2013-06-12: qty 2

## 2013-06-12 MED ORDER — ONDANSETRON 4 MG PO TBDP: 4.0000 mg | ORAL_TABLET | Freq: Three times a day (TID) | ORAL | Status: DC | PRN

## 2013-06-12 MED ORDER — CIPROFLOXACIN HCL 500 MG PO TABS: 500.0000 mg | ORAL_TABLET | Freq: Two times a day (BID) | ORAL | Status: DC

## 2013-06-12 MED ORDER — URELLE 81 MG PO TABS
1.0000 | ORAL_TABLET | Freq: Three times a day (TID) | ORAL | Status: DC
  Administered 2013-06-12: 81 mg via ORAL
  Filled 2013-06-12 (×3): qty 1

## 2013-06-12 MED ORDER — SACCHAROMYCES BOULARDII 250 MG PO CAPS: 250.0000 mg | ORAL_CAPSULE | Freq: Two times a day (BID) | ORAL | Status: DC

## 2013-06-12 NOTE — Progress Notes (Signed)
PHARMACIST - PHYSICIAN COMMUNICATION DR:   [place] CONCERNING:  Votrient and Ciprofloxacin  Pharmacy asked to look into drug interaction between votrient and ciprofloxacin as pt is being treated for pseudomonas UTI and will likely transition to PO antibiotics soon.    Only significant drug interaction possibility is risk of QTc prolongation with both agents.    QTc during hospital stay has been normal on votrient alone.  Package insert recommends obtaining periodic EKGs and correcting electrolyte abnormalities prior and during treatment.   MD plans on treating for only short course of cipro (~7 days)   Recommendations:  If QTc prior to D/C and electrolytes are WNL, ciprofloxacin should be ok to complete UTI treatment.  Another option is fosfomycin 3g PO q 3 days x 2 days with first dose administered prior to D/C.    Thank you for the drug information question!  Ralene Bathe, PharmD, BCPS 06/12/2013, 9:39 AM  Pager: [phone]

## 2013-06-12 NOTE — Discharge Summary (Signed)
Physician Discharge Summary  Patient ID: Melissa Cross MRN: [id number] DOB/AGE: [birth year] 78 y.o.  Admit date: 06/09/2013 Discharge date: 06/12/2013  Primary Care Physician:  [name], MD  Discharge Diagnoses:   . pseudomonas UTI (lower urinary tract infection) . Hyponatremia . Hypokalemia . Renal cell carcinoma . Unspecified essential hypertension . Lymphoma, large-cell, diffuse  Consults: Oncology, Dr. Julien Nordmann   Recommendations for Outpatient Follow-up:   Patient's urine culture showed Pseudomonas, sensitive to ciprofloxacin. She has received 3 days of IV Zosyn also sensitive during this hospitalization. I placed her on ciprofloxacin for another 7 days to complete the course and I also recommended prophylactic small dose of ciprofloxacin 100 mg bedtime with probiotic.  Allergies:   Allergies  Allergen Reactions  . Crab [Shellfish Allergy] Nausea And Vomiting  . Dilaudid [Hydromorphone Hcl] Other (See Comments)    Hallucinations and nausea     Discharge Medications:   Medication List    STOP taking these medications       losartan-hydrochlorothiazide 50-12.5 MG per tablet  Commonly known as:  HYZAAR      TAKE these medications       amLODipine 5 MG tablet  Commonly known as:  NORVASC  Take 1 tablet (5 mg total) by mouth daily.     aspirin EC 81 MG tablet  Take 81 mg by mouth at bedtime.     cholecalciferol 400 UNITS Tabs tablet  Commonly known as:  VITAMIN D  Take 400 Units by mouth daily.     ciprofloxacin 500 MG tablet  Commonly known as:  CIPRO  Take 1 tablet (500 mg total) by mouth 2 (two) times daily. X 7 days     ciprofloxacin 100 MG tablet  Commonly known as:  CIPRO  Take 1 tablet (100 mg total) by mouth at bedtime.  Start taking on:  06/20/2013     CITRACAL PO  Take 1 tablet by mouth 3 (three) times a week.     levothyroxine 50 MCG tablet  Commonly known as:  SYNTHROID, LEVOTHROID  Take 50 mcg by mouth every morning.      multivitamin with minerals Tabs tablet  Take 1 tablet by mouth daily.     nadolol 40 MG tablet  Commonly known as:  CORGARD  Take 40 mg by mouth at bedtime.     ondansetron 4 MG disintegrating tablet  Commonly known as:  ZOFRAN ODT  Take 1 tablet (4 mg total) by mouth every 8 (eight) hours as needed for nausea or vomiting.     pazopanib 200 MG tablet  Commonly known as:  VOTRIENT  Take 4 tablets (800 mg total) by mouth daily. Take on an empty stomach.     saccharomyces boulardii 250 MG capsule  Commonly known as:  FLORASTOR  Take 1 capsule (250 mg total) by mouth 2 (two) times daily. probiotic     sodium chloride 5 % ophthalmic solution  Commonly known as:  MURO 128  Place 1 drop into the left eye 3 (three) times daily.     URELLE 81 MG Tabs tablet  Take 1 tablet (81 mg total) by mouth every 6 (six) hours as needed for bladder spasms. For dysuria     vitamin B-12 1000 MCG tablet  Commonly known as:  CYANOCOBALAMIN  Place 1,000 mcg under the tongue daily.         Brief H and P: For complete details please refer to admission H and P, but in brief 80 year old female with a  history of TIA, renal cell carcinoma with right nephrectomy (09/02/10), hypertension, NHL, hypothyroidism, and frequent UTIs presents with two-day history of dysuria. The patient went to see her primary care provider 2 days ago. She was given a prescription for fosfomycin, but her pharmacy had difficulty obtaining it. She did not get the abx until 06/09/13. She took the fosfomycin after which she began having N/V. She vomited up the medicine. She continued to have dry heaves. As a result she came to ED for further evaluation. The patient's son is at the bedside supplementing the history. He states that for the past 6 weeks, the patient is a recurrent UTIs with enterococcus. She has been treated with multiple antibiotics including amoxicillin, nitrofurantoin, Augmentin, and fosfomycin with recurrence. The patient  continued to have dysuria. The patient states that she had temperature of 99.44F at home. She continued to have dry heaves in the emergency department. She denies any abdominal pain, hematochezia, melena. She denies any dizziness, chest pain, shortness breath, palpitations. She states that her oral intake has been poor due to to her frequent UTIs in the past several weeks.  In the emergency department, the patient was found to have a temperature 99.7. She was hemodynamically stable. Sodium was 125 potassium 3.0. He was on 500 cc normal saline and KCl 10 mEq. WBC was 10.8. Hepatic enzymes were negative. Urinalysis showed TNTC WBC. Serum creatinine 0.61.   Hospital Course:   UTI (lower urinary tract infection): History of recurrent UTIs per patient while she has been on chemotherapy. Patient had UTI on the UA by her PCP and was given fosfomycin which she did not tolerate. Patient was placed on IV Zosyn and Fortaz at the time of admission to cover for Pseudomonas. Blood cultures remained negative to date. Urine cultures during this hospitalization showed Pseudomonas aeruginosa sensitive to ciprofloxacin (also sensitive to cefepime, Fortaz, gentamicin, imipenem, Zosyn, tobramycin). Patient had received 3 days of IV Zosyn during this hospitalization and she will be discharged on oral ciprofloxacin 500mg  for 7 days. I also recommended her a small dose of ciprofloxacin 100 mg bedtime as prophylactic antibiotic while she's on the chemotherapy drug. I also recommended her to use probiotic while she's on the antibiotics. I also obtained EKG prior to placing her on ciprofloxacin as her chemotherapy medication concurrent with ciprofloxacin can increase QTC. Patient's QT/QTC is 396/408. Please follow QTC periodically while on this chemotherapy and antibiotics.   Lymphoma, large-cell, diffuse oncology was consulted and patient was followed by Dr. Julien Nordmann during hospitalization  Renal cell carcinoma patient's  chemotherapy was continued during hospitalization  Hyponatremia: 125 at the time of admission, likely due to dehydration from nausea and vomiting, UTI and dehydration. Patient was placed on IV fluid hydration, and her sodium is improved to 136 at the time of discharge.   Hypokalemia: Resolved   Unspecified essential hypertension -Currently stable   deconditioning - PTOT evaluation was done and currently patient is at her baseline status, no PT was recommended after discharge  Malnutrition of moderate degree nutrition consult was obtained and patient was placed on boost  Day of Discharge BP 134/66  Pulse 62  Temp(Src) 98 F (36.7 C) (Oral)  Resp 18  Ht 5\' 3"  (1.6 m)  Wt 51.937 kg (114 lb 8 oz)  BMI 20.29 kg/m2  SpO2 96%  Physical Exam: General: Alert and awake oriented x3 not in any acute distress. CVS: S1-S2 clear no murmur rubs or gallops Chest: clear to auscultation bilaterally, no wheezing rales or rhonchi  Abdomen: soft nontender, nondistended, normal bowel sounds Extremities: no cyanosis, clubbing or edema noted bilaterally Neuro: Cranial nerves II-XII intact, no focal neurological deficits   The results of significant diagnostics from this hospitalization (including imaging, microbiology, ancillary and laboratory) are listed below for reference.    LAB RESULTS: Basic Metabolic Panel:  Recent Labs Lab 06/10/13 0348  06/11/13 0504 06/12/13 0503  NA 128*  < > 138 136*  K 3.8  < > 3.9 3.4*  CL 90*  < > 103 98  CO2 25  < > 25 25  GLUCOSE 106*  < > 85 87  BUN 9  < > 6 5*  CREATININE 0.65  < > 0.76 0.79  CALCIUM 8.4  < > 8.4 8.9  MG 1.4*  --   --   --   < > = values in this interval not displayed. Liver Function Tests:  Recent Labs Lab 06/09/13 2230  AST 23  ALT 17  ALKPHOS 94  BILITOT 0.9  PROT 6.5  ALBUMIN 3.7   No results found for this basename: LIPASE, AMYLASE,  in the last 168 hours No results found for this basename: AMMONIA,  in the last 168  hours CBC:  Recent Labs Lab 06/09/13 2222 06/10/13 0348 06/12/13 0503  WBC 10.8* 8.9 3.1*  NEUTROABS 9.6*  --   --   HGB 13.7 11.0* 11.4*  HCT 37.5 30.3* 33.1*  MCV 105.3* 104.1* 108.9*  PLT 221 185 157   Cardiac Enzymes: No results found for this basename: CKTOTAL, CKMB, CKMBINDEX, TROPONINI,  in the last 168 hours BNP: No components found with this basename: POCBNP,  CBG: No results found for this basename: GLUCAP,  in the last 168 hours  Significant Diagnostic Studies:  No results found.  Disposition and Follow-up: Discharge Instructions   Discharge instructions    Complete by:  As directed   Please discuss with Dr. Stephanie Acre regarding ciprofloxacin prophylaxis (100 mg bedtime) to prevent UTIs while on the chemotherapy drug     Increase activity slowly    Complete by:  As directed             DISPOSITION: home DIET: Regular diet   DISCHARGE FOLLOW-UP Follow-up Information   Follow up with [name], MD. Schedule an appointment as soon as possible for a visit in 1 week. (7-10 days for hospital follow-up)    Specialty:  Family Medicine   Contact information:   [address] [place] [address] [phone]       Time spent on Discharge: 45 mins  Signed:   Mendel Corning M.D. [facility] 06/12/2013, 11:43 AM Pager: [phone]   **Disclaimer: This note was dictated with voice recognition software. Similar sounding words can inadvertently be transcribed and this note may contain transcription errors which may not have been corrected upon publication of note.**

## 2013-06-13 NOTE — Progress Notes (Signed)
CARE MANAGEMENT NOTE 06/13/2013  Patient:  Melissa Cross, Melissa Cross   Account Number:  [id number]  Date Initiated:  06/10/2013  Documentation initiated by:  Sunday Spillers  Subjective/Objective Assessment:   80 yo female admitted with UTI, failed OP treatment. PTA lived at home alone. PMH renal cell carcinoma with right nephrectomy     Action/Plan:   Home when stable.   Anticipated DC Date:  06/13/2013   Anticipated DC Plan:  [place]  CM consult      Choice offered to / List presented to:             Status of service:  Completed, signed off Medicare Important Message given?  YES (If response is "NO", the following Medicare IM given date fields will be blank) Date Medicare IM given:  06/09/2013 Date Additional Medicare IM given:    Discharge Disposition:  HOME/SELF CARE  Per UR Regulation:  Reviewed for med. necessity/level of care/duration of stay  If discussed at [place] of Stay Meetings, dates discussed:    Comments:  No NCM needs identified. Jonnie Finner RN CCM Case Mgmt phone [phone]

## 2013-06-14 NOTE — ED Provider Notes (Signed)
Medical screening examination/treatment/procedure(s) were performed by non-physician practitioner and as supervising physician I was immediately available for consultation/collaboration.   EKG Interpretation None       Virgel Manifold, MD 06/14/13 [id number]

## 2013-06-15 ENCOUNTER — Encounter: Payer: Self-pay | Admitting: Internal Medicine

## 2013-06-15 ENCOUNTER — Other Ambulatory Visit (HOSPITAL_BASED_OUTPATIENT_CLINIC_OR_DEPARTMENT_OTHER): Payer: Medicare Other

## 2013-06-15 ENCOUNTER — Ambulatory Visit (HOSPITAL_BASED_OUTPATIENT_CLINIC_OR_DEPARTMENT_OTHER): Payer: Medicare Other | Admitting: Internal Medicine

## 2013-06-15 ENCOUNTER — Telehealth: Payer: Self-pay | Admitting: Internal Medicine

## 2013-06-15 VITALS — BP 148/78 | HR 84 | Temp 98.1°F | Resp 17 | Ht 63.0 in | Wt 112.4 lb

## 2013-06-15 DIAGNOSIS — N390 Urinary tract infection, site not specified: Secondary | ICD-10-CM | POA: Diagnosis not present

## 2013-06-15 DIAGNOSIS — C649 Malignant neoplasm of unspecified kidney, except renal pelvis: Secondary | ICD-10-CM

## 2013-06-15 DIAGNOSIS — C7800 Secondary malignant neoplasm of unspecified lung: Secondary | ICD-10-CM

## 2013-06-15 LAB — CBC WITH DIFFERENTIAL/PLATELET
BASO%: 0.5 % (ref 0.0–2.0)
Basophils Absolute: 0 10*3/uL (ref 0.0–0.1)
EOS ABS: 0 10*3/uL (ref 0.0–0.5)
EOS%: 1.3 % (ref 0.0–7.0)
HCT: 38 % (ref 34.8–46.6)
HEMOGLOBIN: 13.1 g/dL (ref 11.6–15.9)
LYMPH%: 16.3 % (ref 14.0–49.7)
MCH: 38.2 pg — ABNORMAL HIGH (ref 25.1–34.0)
MCHC: 34.5 g/dL (ref 31.5–36.0)
MCV: 110.8 fL — ABNORMAL HIGH (ref 79.5–101.0)
MONO#: 0.4 10*3/uL (ref 0.1–0.9)
MONO%: 9.9 % (ref 0.0–14.0)
NEUT%: 72 % (ref 38.4–76.8)
NEUTROS ABS: 2.7 10*3/uL (ref 1.5–6.5)
Platelets: 247 10*3/uL (ref 145–400)
RBC: 3.43 10*6/uL — ABNORMAL LOW (ref 3.70–5.45)
RDW: 14.2 % (ref 11.2–14.5)
WBC: 3.8 10*3/uL — ABNORMAL LOW (ref 3.9–10.3)
lymph#: 0.6 10*3/uL — ABNORMAL LOW (ref 0.9–3.3)

## 2013-06-15 LAB — COMPREHENSIVE METABOLIC PANEL (CC13)
ALBUMIN: 3.6 g/dL (ref 3.5–5.0)
ALT: 27 U/L (ref 0–55)
AST: 26 U/L (ref 5–34)
Alkaline Phosphatase: 81 U/L (ref 40–150)
Anion Gap: 10 mEq/L (ref 3–11)
BUN: 16.8 mg/dL (ref 7.0–26.0)
CALCIUM: 9.9 mg/dL (ref 8.4–10.4)
CHLORIDE: 95 meq/L — AB (ref 98–109)
CO2: 27 meq/L (ref 22–29)
Creatinine: 1 mg/dL (ref 0.6–1.1)
Glucose: 118 mg/dl (ref 70–140)
POTASSIUM: 3.6 meq/L (ref 3.5–5.1)
Sodium: 133 mEq/L — ABNORMAL LOW (ref 136–145)
Total Bilirubin: 0.91 mg/dL (ref 0.20–1.20)
Total Protein: 6.4 g/dL (ref 6.4–8.3)

## 2013-06-15 LAB — LACTATE DEHYDROGENASE (CC13): LDH: 193 U/L (ref 125–245)

## 2013-06-15 NOTE — Progress Notes (Signed)
[place] [phone]   Fax:([phone]  OFFICE PROGRESS NOTE  [name], MD [address] [place] [address]  DIAGNOSIS:  #1 non-Hodgkin's lymphoma diagnosed on bone marrow biopsy in June of 2012  #2 metastatic renal cell carcinoma initially diagnosed as stage III (T3a N0M0) renal cell carcinoma diagnosed in June of 2012 with pulmonary metastasis diagnosed in August of 2014.  PRIOR THERAPY:  1) Status post right nephrectomy under the care of Dr. Diona Fanti on 09/02/2010.  2) Systemic chemotherapy with CHOP/Rituxan given every 3 weeks, status post 6 cycles.   CURRENT THERAPY: Votrient 800 mg by mouth daily. Status post 9 months of treatment.  INTERVAL HISTORY: Melissa Cross 80 y.o. female returns to the clinic today for followup visit accompanied by her son. The patient is feeling fine today with no specific complaints except for mild fatigue and generalized weakness. She was recently admitted to [facility] with urinary tract infection as well as nausea and vomiting and dehydration. She was treated with 3 days of Zosyn and was discharged home on Cipro for urine culture that was positive for Pseudomonas. She is recovering slowly from his recent admission. She continued her treatment with Votrient with no interruption. She is tolerating her treatment well. She denied having any significant night sweats. The patient denied having any chest pain, shortness of breath, cough or hemoptysis. She has no nausea or vomiting. The patient denied having any fever or chills.   MEDICAL HISTORY: Past Medical History  Diagnosis Date  . Hypercholesteremia     takes Zocor daily  . Glaucoma   . Anemia   . Stroke     hx of TIA  . Blood transfusion   . Thrush 12/17/2010  . Renal cell carcinoma   . Non Hodgkin's lymphoma   . Cancer     kidney cancer, NHL   . Cancer     last chemo 11/04/10   . Hypertension     takes Nadolol  daily  . Hypothyroidism     takes Synthroid daily  . Hypothyroid   . PONV (postoperative nausea and vomiting)   . Shortness of breath     with exertion  . Headache(784.0)     hx of migraines  . TIA (transient ischemic attack)   . Peripheral neuropathy   . Arthritis     knee /hip  . Scoliosis   . History of bladder infections   . History of blood transfusion   . Cataract     right  . History of shingles     ALLERGIES:  is allergic to crab and dilaudid.  MEDICATIONS:  Current Outpatient Prescriptions  Medication Sig Dispense Refill  . amLODipine (NORVASC) 5 MG tablet Take 1 tablet (5 mg total) by mouth daily.  30 tablet  0  . aspirin EC 81 MG tablet Take 81 mg by mouth at bedtime.       . Calcium Citrate (CITRACAL PO) Take 1 tablet by mouth 3 (three) times a week.       . cholecalciferol (VITAMIN D) 400 UNITS TABS Take 400 Units by mouth daily.        Derrill Memo ON 06/20/2013] ciprofloxacin (CIPRO) 100 MG tablet Take 1 tablet (100 mg total) by mouth at bedtime.  30 tablet  0  . ciprofloxacin (CIPRO) 500 MG tablet Take 1 tablet (500 mg total) by mouth 2 (two) times daily. X 7 days  14 tablet  0  . levothyroxine (SYNTHROID, LEVOTHROID) 50 MCG tablet Take 50 mcg by mouth every morning.       . Multiple Vitamin (MULITIVITAMIN WITH MINERALS) TABS Take 1 tablet by mouth daily.        . nadolol (CORGARD) 40 MG tablet Take 40 mg by mouth at bedtime.       . ondansetron (ZOFRAN ODT) 4 MG disintegrating tablet Take 1 tablet (4 mg total) by mouth every 8 (eight) hours as needed for nausea or vomiting.  30 tablet  0  . pazopanib (VOTRIENT) 200 MG tablet Take 4 tablets (800 mg total) by mouth daily. Take on an empty stomach.  120 tablet  2  . sodium chloride (MURO 128) 5 % ophthalmic solution Place 1 drop into the left eye 3 (three) times daily.      . vitamin B-12 (CYANOCOBALAMIN) 1000 MCG tablet Place 1,000 mcg under the tongue daily.        No current facility-administered medications for  this visit.   Facility-Administered Medications Ordered in Other Visits  Medication Dose Route Frequency Provider Last Rate Last Dose  . sodium chloride 0.9 % injection 10 mL  10 mL Intravenous PRN Curt Bears, MD   10 mL at 05/17/12 0846    SURGICAL HISTORY:  Past Surgical History  Procedure Laterality Date  . Nephrectomy      Right Side  . Portacath placement      right subclavian  . Tonsillectomy      as a child  . Mouth surgery    . Left cataract removed    . Video bronchoscopy with endobronchial ultrasound N/A 08/06/2012    Procedure: VIDEO BRONCHOSCOPY WITH ENDOBRONCHIAL ULTRASOUND;  Surgeon: Collene Gobble, MD;  Location: [facility];  Service: Thoracic;  Laterality: N/A;  . Video bronchoscopy with endobronchial navigation N/A 08/06/2012    Procedure: VIDEO BRONCHOSCOPY WITH ENDOBRONCHIAL NAVIGATION;  Surgeon: Collene Gobble, MD;  Location: [place];  Service: Thoracic;  Laterality: N/A;    REVIEW OF SYSTEMS:  Constitutional: positive for fatigue Eyes: negative Ears, nose, mouth, throat, and face: negative Respiratory: negative Cardiovascular: negative Gastrointestinal: negative Genitourinary:negative Integument/breast: negative Hematologic/lymphatic: negative Musculoskeletal:negative Neurological: negative Behavioral/Psych: negative Endocrine: negative Allergic/Immunologic: negative   PHYSICAL EXAMINATION: General appearance: alert, cooperative, fatigued and no distress Head: Normocephalic, without obvious abnormality, atraumatic Neck: no adenopathy Lymph nodes: Cervical, supraclavicular, and axillary nodes normal. Resp: clear to auscultation bilaterally Cardio: regular rate and rhythm, S1, S2 normal, no murmur, click, rub or gallop GI: soft, non-tender; bowel sounds normal; no masses,  no organomegaly Extremities: extremities normal, atraumatic, no cyanosis or edema Neurologic: Alert and oriented X 3, normal strength and tone. Normal symmetric reflexes. Normal  coordination and gait  ECOG PERFORMANCE STATUS: 2 - Symptomatic, <50% confined to bed  Blood pressure 148/78, pulse 84, temperature 98.1 F (36.7 C), temperature source Oral, resp. rate 17, height $RemoveBe'5\' 3"'POAzkGHci$  (1.6 m), weight 112 lb 6.4 oz (50.984 kg).  LABORATORY DATA: Lab Results  Component Value Date   WBC 3.8* 06/15/2013   HGB 13.1 06/15/2013   HCT 38.0 06/15/2013   MCV 110.8* 06/15/2013   PLT 247 06/15/2013      Chemistry      Component Value Date/Time   NA 136* 06/12/2013 0503   NA 131* 05/04/2013 0915   K 3.4* 06/12/2013 0503   K 3.5 05/04/2013 0915   CL 98 06/12/2013 0503   CL 102 04/01/2012 0805   CO2 25 06/12/2013 0503   CO2 26  05/04/2013 0915   BUN 5* 06/12/2013 0503   BUN 16.0 05/04/2013 0915   CREATININE 0.79 06/12/2013 0503   CREATININE 0.8 05/04/2013 0915      Component Value Date/Time   CALCIUM 8.9 06/12/2013 0503   CALCIUM 9.9 05/04/2013 0915   ALKPHOS 94 06/09/2013 2230   ALKPHOS 99 05/04/2013 0915   AST 23 06/09/2013 2230   AST 19 05/04/2013 0915   ALT 17 06/09/2013 2230   ALT 18 05/04/2013 0915   BILITOT 0.9 06/09/2013 2230   BILITOT 0.82 05/04/2013 0915       RADIOGRAPHIC STUDIES:  ASSESSMENT AND PLAN: This is a very pleasant 80 years old white female with metastatic renal cell carcinoma.  She is currently on treatment with Votrient 800 mg by mouth daily status post 9 months of treatment and tolerating it fairly well.  I recommended for her to continue on treatment with Votrient 800 mg by mouth daily. For urinary tract infection, she would continue her current treatment with Cipro and advice addition to consult with her urologist for the recurrent urinary tract infection. She would come back for followup visit in one months with repeat blood work. She was advised to call immediately if she has any concerning symptoms in the interval. The patient voices understanding of current disease status and treatment options and is in agreement with the current care plan.  All questions were  answered. The patient knows to call the clinic with any problems, questions or concerns. We can certainly see the patient much sooner if necessary.  Disclaimer: This note was dictated with voice recognition software. Similar sounding words can inadvertently be transcribed and may not be corrected upon review.

## 2013-06-15 NOTE — Telephone Encounter (Signed)
gv adn printed appt sched and avs for pt for June °

## 2013-06-16 LAB — CULTURE, BLOOD (ROUTINE X 2)
CULTURE: NO GROWTH
Culture: NO GROWTH

## 2013-06-20 DIAGNOSIS — Z09 Encounter for follow-up examination after completed treatment for conditions other than malignant neoplasm: Secondary | ICD-10-CM | POA: Diagnosis not present

## 2013-06-20 DIAGNOSIS — N390 Urinary tract infection, site not specified: Secondary | ICD-10-CM | POA: Diagnosis not present

## 2013-06-20 DIAGNOSIS — I10 Essential (primary) hypertension: Secondary | ICD-10-CM | POA: Diagnosis not present

## 2013-06-20 DIAGNOSIS — C649 Malignant neoplasm of unspecified kidney, except renal pelvis: Secondary | ICD-10-CM | POA: Diagnosis not present

## 2013-07-01 DIAGNOSIS — N390 Urinary tract infection, site not specified: Secondary | ICD-10-CM | POA: Diagnosis not present

## 2013-07-13 ENCOUNTER — Telehealth: Payer: Self-pay | Admitting: Internal Medicine

## 2013-07-13 ENCOUNTER — Ambulatory Visit (HOSPITAL_BASED_OUTPATIENT_CLINIC_OR_DEPARTMENT_OTHER): Payer: Medicare Other

## 2013-07-13 ENCOUNTER — Encounter: Payer: Self-pay | Admitting: Internal Medicine

## 2013-07-13 ENCOUNTER — Other Ambulatory Visit (HOSPITAL_BASED_OUTPATIENT_CLINIC_OR_DEPARTMENT_OTHER): Payer: Medicare Other

## 2013-07-13 ENCOUNTER — Ambulatory Visit (HOSPITAL_BASED_OUTPATIENT_CLINIC_OR_DEPARTMENT_OTHER): Payer: Medicare Other | Admitting: Internal Medicine

## 2013-07-13 VITALS — BP 141/65 | HR 63 | Temp 97.5°F | Resp 17 | Ht 63.0 in | Wt 113.5 lb

## 2013-07-13 DIAGNOSIS — C649 Malignant neoplasm of unspecified kidney, except renal pelvis: Secondary | ICD-10-CM

## 2013-07-13 DIAGNOSIS — C8589 Other specified types of non-Hodgkin lymphoma, extranodal and solid organ sites: Secondary | ICD-10-CM

## 2013-07-13 DIAGNOSIS — Z95828 Presence of other vascular implants and grafts: Secondary | ICD-10-CM

## 2013-07-13 DIAGNOSIS — C641 Malignant neoplasm of right kidney, except renal pelvis: Secondary | ICD-10-CM

## 2013-07-13 LAB — CBC WITH DIFFERENTIAL/PLATELET
BASO%: 0.7 % (ref 0.0–2.0)
Basophils Absolute: 0 10*3/uL (ref 0.0–0.1)
EOS%: 2.6 % (ref 0.0–7.0)
Eosinophils Absolute: 0.1 10*3/uL (ref 0.0–0.5)
HCT: 36.1 % (ref 34.8–46.6)
HGB: 12.4 g/dL (ref 11.6–15.9)
LYMPH%: 11.8 % — ABNORMAL LOW (ref 14.0–49.7)
MCH: 37.4 pg — ABNORMAL HIGH (ref 25.1–34.0)
MCHC: 34.4 g/dL (ref 31.5–36.0)
MCV: 108.6 fL — ABNORMAL HIGH (ref 79.5–101.0)
MONO#: 0.4 10*3/uL (ref 0.1–0.9)
MONO%: 10.5 % (ref 0.0–14.0)
NEUT#: 2.8 10*3/uL (ref 1.5–6.5)
NEUT%: 74.4 % (ref 38.4–76.8)
PLATELETS: 260 10*3/uL (ref 145–400)
RBC: 3.33 10*6/uL — AB (ref 3.70–5.45)
RDW: 14.5 % (ref 11.2–14.5)
WBC: 3.7 10*3/uL — AB (ref 3.9–10.3)
lymph#: 0.4 10*3/uL — ABNORMAL LOW (ref 0.9–3.3)

## 2013-07-13 LAB — COMPREHENSIVE METABOLIC PANEL (CC13)
ALK PHOS: 91 U/L (ref 40–150)
ALT: 21 U/L (ref 0–55)
AST: 19 U/L (ref 5–34)
Albumin: 3.4 g/dL — ABNORMAL LOW (ref 3.5–5.0)
Anion Gap: 9 mEq/L (ref 3–11)
BILIRUBIN TOTAL: 0.68 mg/dL (ref 0.20–1.20)
BUN: 9.1 mg/dL (ref 7.0–26.0)
CO2: 27 mEq/L (ref 22–29)
Calcium: 9.4 mg/dL (ref 8.4–10.4)
Chloride: 92 mEq/L — ABNORMAL LOW (ref 98–109)
Creatinine: 0.8 mg/dL (ref 0.6–1.1)
Glucose: 115 mg/dl (ref 70–140)
Potassium: 3.1 mEq/L — ABNORMAL LOW (ref 3.5–5.1)
SODIUM: 128 meq/L — AB (ref 136–145)
Total Protein: 5.9 g/dL — ABNORMAL LOW (ref 6.4–8.3)

## 2013-07-13 LAB — LACTATE DEHYDROGENASE (CC13): LDH: 182 U/L (ref 125–245)

## 2013-07-13 MED ORDER — SODIUM CHLORIDE 0.9 % IJ SOLN
10.0000 mL | INTRAMUSCULAR | Status: DC | PRN
  Administered 2013-07-13: 10 mL via INTRAVENOUS
  Filled 2013-07-13: qty 10

## 2013-07-13 MED ORDER — HEPARIN SOD (PORK) LOCK FLUSH 100 UNIT/ML IV SOLN
500.0000 [IU] | Freq: Once | INTRAVENOUS | Status: AC
  Administered 2013-07-13: 500 [IU] via INTRAVENOUS
  Filled 2013-07-13: qty 5

## 2013-07-13 NOTE — Telephone Encounter (Signed)
gv and printed appt sched and avs fo rpt for Aug...gv pt barium

## 2013-07-13 NOTE — Progress Notes (Signed)
[place] [phone]   Fax:(336) [phone]  OFFICE PROGRESS NOTE  [name], MD [address] [place] [address]  DIAGNOSIS:  #1 non-Hodgkin's lymphoma diagnosed on bone marrow biopsy in June of 2012  #2 metastatic renal cell carcinoma initially diagnosed as stage III (T3a N0M0) renal cell carcinoma diagnosed in June of 2012 with pulmonary metastasis diagnosed in August of 2014.  PRIOR THERAPY:  1) Status post right nephrectomy under the care of Dr. Diona Fanti on 09/02/2010.  2) Systemic chemotherapy with CHOP/Rituxan given every 3 weeks, status post 6 cycles.   CURRENT THERAPY: Votrient 800 mg by mouth daily. Status post 10 months of treatment.  INTERVAL HISTORY: Melissa Cross 80 y.o. female returns to the clinic today for followup visit accompanied by her son. The patient is feeling fine today with no specific complaints except for mild fatigue.She is tolerating her treatment with Votrient well. She denied having any significant weight loss or night sweats. The patient denied having any chest pain, shortness of breath, cough or hemoptysis. She has no nausea or vomiting. The patient denied having any fever or chills.   MEDICAL HISTORY: Past Medical History  Diagnosis Date  . Hypercholesteremia     takes Zocor daily  . Glaucoma   . Anemia   . Stroke     hx of TIA  . Blood transfusion   . Thrush 12/17/2010  . Renal cell carcinoma   . Non Hodgkin's lymphoma   . Cancer     kidney cancer, NHL   . Cancer     last chemo 11/04/10   . Hypertension     takes Nadolol daily  . Hypothyroidism     takes Synthroid daily  . Hypothyroid   . PONV (postoperative nausea and vomiting)   . Shortness of breath     with exertion  . Headache(784.0)     hx of migraines  . TIA (transient ischemic attack)   . Peripheral neuropathy   . Arthritis     knee /hip  . Scoliosis   . History of bladder infections   . History of blood  transfusion   . Cataract     right  . History of shingles     ALLERGIES:  is allergic to crab and dilaudid.  MEDICATIONS:  Current Outpatient Prescriptions  Medication Sig Dispense Refill  . amLODipine (NORVASC) 5 MG tablet Take 1 tablet (5 mg total) by mouth daily.  30 tablet  0  . aspirin EC 81 MG tablet Take 81 mg by mouth at bedtime.       . Calcium Citrate (CITRACAL PO) Take 1 tablet by mouth 3 (three) times a week.       . cholecalciferol (VITAMIN D) 400 UNITS TABS Take 400 Units by mouth daily.        . ciprofloxacin (CIPRO) 100 MG tablet Take 1 tablet (100 mg total) by mouth at bedtime.  30 tablet  0  . levothyroxine (SYNTHROID, LEVOTHROID) 50 MCG tablet Take 50 mcg by mouth every morning.       . Multiple Vitamin (MULITIVITAMIN WITH MINERALS) TABS Take 1 tablet by mouth daily.        . nadolol (CORGARD) 40 MG tablet Take 40 mg by mouth at bedtime.       . ondansetron (ZOFRAN ODT) 4 MG disintegrating tablet Take 1 tablet (4 mg total) by mouth every 8 (eight) hours as needed for nausea or vomiting.  30 tablet  0  . pazopanib (VOTRIENT) 200 MG tablet Take 4 tablets (800 mg total) by mouth daily. Take on an empty stomach.  120 tablet  2  . sodium chloride (MURO 128) 5 % ophthalmic solution Place 1 drop into the left eye 3 (three) times daily.      . vitamin B-12 (CYANOCOBALAMIN) 1000 MCG tablet Place 1,000 mcg under the tongue daily.        No current facility-administered medications for this visit.   Facility-Administered Medications Ordered in Other Visits  Medication Dose Route Frequency Provider Last Rate Last Dose  . sodium chloride 0.9 % injection 10 mL  10 mL Intravenous PRN Curt Bears, MD   10 mL at 05/17/12 0846    SURGICAL HISTORY:  Past Surgical History  Procedure Laterality Date  . Nephrectomy      Right Side  . Portacath placement      right subclavian  . Tonsillectomy      as a child  . Mouth surgery    . Left cataract removed    . Video bronchoscopy  with endobronchial ultrasound N/A 08/06/2012    Procedure: VIDEO BRONCHOSCOPY WITH ENDOBRONCHIAL ULTRASOUND;  Surgeon: Collene Gobble, MD;  Location: [place] [facility];  Service: Thoracic;  Laterality: N/A;  . Video bronchoscopy with endobronchial navigation N/A 08/06/2012    Procedure: VIDEO BRONCHOSCOPY WITH ENDOBRONCHIAL NAVIGATION;  Surgeon: Collene Gobble, MD;  Location: [facility];  Service: Thoracic;  Laterality: N/A;    REVIEW OF SYSTEMS:  Constitutional: positive for fatigue Eyes: negative Ears, nose, mouth, throat, and face: negative Respiratory: negative Cardiovascular: negative Gastrointestinal: negative Genitourinary:negative Integument/breast: negative Hematologic/lymphatic: negative Musculoskeletal:negative Neurological: negative Behavioral/Psych: negative Endocrine: negative Allergic/Immunologic: negative   PHYSICAL EXAMINATION: General appearance: alert, cooperative, fatigued and no distress Head: Normocephalic, without obvious abnormality, atraumatic Neck: no adenopathy Lymph nodes: Cervical, supraclavicular, and axillary nodes normal. Resp: clear to auscultation bilaterally Cardio: regular rate and rhythm, S1, S2 normal, no murmur, click, rub or gallop GI: soft, non-tender; bowel sounds normal; no masses,  no organomegaly Extremities: extremities normal, atraumatic, no cyanosis or edema Neurologic: Alert and oriented X 3, normal strength and tone. Normal symmetric reflexes. Normal coordination and gait  ECOG PERFORMANCE STATUS: 2 - Symptomatic, <50% confined to bed  Blood pressure 141/65, pulse 63, temperature 97.5 F (36.4 C), temperature source Oral, resp. rate 17, height 5' 3" (1.6 m), weight 113 lb 8 oz (51.483 kg), SpO2 100.00%.  LABORATORY DATA: Lab Results  Component Value Date   WBC 3.7* 07/13/2013   HGB 12.4 07/13/2013   HCT 36.1 07/13/2013   MCV 108.6* 07/13/2013   PLT 260 07/13/2013      Chemistry      Component Value Date/Time   NA 128* 07/13/2013 0852   NA 136*  06/12/2013 0503   K 3.1* 07/13/2013 0852   K 3.4* 06/12/2013 0503   CL 98 06/12/2013 0503   CL 102 04/01/2012 0805   CO2 27 07/13/2013 0852   CO2 25 06/12/2013 0503   BUN 9.1 07/13/2013 0852   BUN 5* 06/12/2013 0503   CREATININE 0.8 07/13/2013 0852   CREATININE 0.79 06/12/2013 0503      Component Value Date/Time   CALCIUM 9.4 07/13/2013 0852   CALCIUM 8.9 06/12/2013 0503   ALKPHOS 91 07/13/2013 0852   ALKPHOS 94 06/09/2013 2230   AST 19 07/13/2013 0852   AST 23 06/09/2013 2230   ALT 21 07/13/2013 0852   ALT 17 06/09/2013 2230   BILITOT 0.68  07/13/2013 0852   BILITOT 0.9 06/09/2013 2230       RADIOGRAPHIC STUDIES:  ASSESSMENT AND PLAN: This is a very pleasant 80 years old white female with metastatic renal cell carcinoma.  She is currently on treatment with Votrient 800 mg by mouth daily status post 10 months of treatment and tolerating it fairly well.  I recommended for her to continue on treatment with Votrient 800 mg by mouth daily. She is currently on Cipro 100 mg by mouth daily for urinary tract prophylaxis. She would come back for followup visit in one months with repeat blood work in addition to CT scan of the chest, abdomen and pelvis without contrast for restaging of her disease. She was advised to call immediately if she has any concerning symptoms in the interval. The patient voices understanding of current disease status and treatment options and is in agreement with the current care plan.  All questions were answered. The patient knows to call the clinic with any problems, questions or concerns. We can certainly see the patient much sooner if necessary.  Disclaimer: This note was dictated with voice recognition software. Similar sounding words can inadvertently be transcribed and may not be corrected upon review.

## 2013-08-09 ENCOUNTER — Ambulatory Visit (HOSPITAL_COMMUNITY): Payer: Medicare Other

## 2013-08-09 DIAGNOSIS — H2510 Age-related nuclear cataract, unspecified eye: Secondary | ICD-10-CM | POA: Diagnosis not present

## 2013-08-09 DIAGNOSIS — H18519 Endothelial corneal dystrophy, unspecified eye: Secondary | ICD-10-CM | POA: Diagnosis not present

## 2013-08-16 DIAGNOSIS — N390 Urinary tract infection, site not specified: Secondary | ICD-10-CM | POA: Diagnosis not present

## 2013-08-17 ENCOUNTER — Telehealth: Payer: Self-pay | Admitting: Medical Oncology

## 2013-08-17 ENCOUNTER — Ambulatory Visit (HOSPITAL_COMMUNITY)
Admission: RE | Admit: 2013-08-17 | Discharge: 2013-08-17 | Disposition: A | Discharge status: Home / Self Care | Payer: Medicare Other | Source: Ambulatory Visit | Attending: Internal Medicine | Admitting: Internal Medicine

## 2013-08-17 ENCOUNTER — Other Ambulatory Visit (HOSPITAL_BASED_OUTPATIENT_CLINIC_OR_DEPARTMENT_OTHER): Payer: Medicare Other

## 2013-08-17 DIAGNOSIS — C8589 Other specified types of non-Hodgkin lymphoma, extranodal and solid organ sites: Secondary | ICD-10-CM | POA: Diagnosis not present

## 2013-08-17 DIAGNOSIS — E876 Hypokalemia: Secondary | ICD-10-CM

## 2013-08-17 DIAGNOSIS — C641 Malignant neoplasm of right kidney, except renal pelvis: Secondary | ICD-10-CM

## 2013-08-17 DIAGNOSIS — C7800 Secondary malignant neoplasm of unspecified lung: Secondary | ICD-10-CM | POA: Diagnosis not present

## 2013-08-17 DIAGNOSIS — C649 Malignant neoplasm of unspecified kidney, except renal pelvis: Secondary | ICD-10-CM

## 2013-08-17 LAB — CBC WITH DIFFERENTIAL/PLATELET
BASO%: 0.4 % (ref 0.0–2.0)
BASOS ABS: 0 10*3/uL (ref 0.0–0.1)
EOS%: 1.3 % (ref 0.0–7.0)
Eosinophils Absolute: 0.1 10*3/uL (ref 0.0–0.5)
HEMATOCRIT: 37.8 % (ref 34.8–46.6)
HEMOGLOBIN: 13.2 g/dL (ref 11.6–15.9)
LYMPH%: 10 % — ABNORMAL LOW (ref 14.0–49.7)
MCH: 36.9 pg — AB (ref 25.1–34.0)
MCHC: 35 g/dL (ref 31.5–36.0)
MCV: 105.6 fL — AB (ref 79.5–101.0)
MONO#: 0.5 10*3/uL (ref 0.1–0.9)
MONO%: 10.5 % (ref 0.0–14.0)
NEUT#: 3.6 10*3/uL (ref 1.5–6.5)
NEUT%: 77.8 % — AB (ref 38.4–76.8)
PLATELETS: 268 10*3/uL (ref 145–400)
RBC: 3.58 10*6/uL — ABNORMAL LOW (ref 3.70–5.45)
RDW: 14.4 % (ref 11.2–14.5)
WBC: 4.7 10*3/uL (ref 3.9–10.3)
lymph#: 0.5 10*3/uL — ABNORMAL LOW (ref 0.9–3.3)

## 2013-08-17 LAB — COMPREHENSIVE METABOLIC PANEL (CC13)
ALK PHOS: 88 U/L (ref 40–150)
ALT: 20 U/L (ref 0–55)
AST: 25 U/L (ref 5–34)
Albumin: 3.6 g/dL (ref 3.5–5.0)
Anion Gap: 7 mEq/L (ref 3–11)
BUN: 9.8 mg/dL (ref 7.0–26.0)
CALCIUM: 9.5 mg/dL (ref 8.4–10.4)
CO2: 30 mEq/L — ABNORMAL HIGH (ref 22–29)
CREATININE: 0.8 mg/dL (ref 0.6–1.1)
Chloride: 84 mEq/L — ABNORMAL LOW (ref 98–109)
Glucose: 99 mg/dl (ref 70–140)
Potassium: 2.9 mEq/L — CL (ref 3.5–5.1)
Sodium: 122 mEq/L — ABNORMAL LOW (ref 136–145)
Total Bilirubin: 0.93 mg/dL (ref 0.20–1.20)
Total Protein: 6.4 g/dL (ref 6.4–8.3)

## 2013-08-17 LAB — LACTATE DEHYDROGENASE (CC13): LDH: 198 U/L (ref 125–245)

## 2013-08-17 MED ORDER — POTASSIUM CHLORIDE CRYS ER 20 MEQ PO TBCR: 20.0000 meq | EXTENDED_RELEASE_TABLET | Freq: Two times a day (BID) | ORAL | Status: DC

## 2013-08-17 NOTE — Telephone Encounter (Signed)
Pt notified to pick up kdur rx.

## 2013-08-24 ENCOUNTER — Telehealth: Payer: Self-pay | Admitting: Internal Medicine

## 2013-08-24 ENCOUNTER — Other Ambulatory Visit (HOSPITAL_BASED_OUTPATIENT_CLINIC_OR_DEPARTMENT_OTHER): Payer: Medicare Other

## 2013-08-24 ENCOUNTER — Ambulatory Visit (HOSPITAL_BASED_OUTPATIENT_CLINIC_OR_DEPARTMENT_OTHER): Payer: Medicare Other | Admitting: Internal Medicine

## 2013-08-24 ENCOUNTER — Encounter: Payer: Self-pay | Admitting: Internal Medicine

## 2013-08-24 VITALS — BP 160/71 | HR 62 | Temp 97.7°F | Resp 18 | Ht 63.0 in | Wt 113.2 lb

## 2013-08-24 DIAGNOSIS — C649 Malignant neoplasm of unspecified kidney, except renal pelvis: Secondary | ICD-10-CM

## 2013-08-24 DIAGNOSIS — C8589 Other specified types of non-Hodgkin lymphoma, extranodal and solid organ sites: Secondary | ICD-10-CM

## 2013-08-24 DIAGNOSIS — C7800 Secondary malignant neoplasm of unspecified lung: Secondary | ICD-10-CM

## 2013-08-24 DIAGNOSIS — M6281 Muscle weakness (generalized): Secondary | ICD-10-CM | POA: Diagnosis not present

## 2013-08-24 DIAGNOSIS — R5381 Other malaise: Secondary | ICD-10-CM

## 2013-08-24 DIAGNOSIS — C8330 Diffuse large B-cell lymphoma, unspecified site: Secondary | ICD-10-CM

## 2013-08-24 DIAGNOSIS — C641 Malignant neoplasm of right kidney, except renal pelvis: Secondary | ICD-10-CM

## 2013-08-24 DIAGNOSIS — R5383 Other fatigue: Secondary | ICD-10-CM

## 2013-08-24 LAB — BASIC METABOLIC PANEL (CC13)
ANION GAP: 8 meq/L (ref 3–11)
BUN: 11.1 mg/dL (ref 7.0–26.0)
CO2: 27 mEq/L (ref 22–29)
Calcium: 9.4 mg/dL (ref 8.4–10.4)
Chloride: 94 mEq/L — ABNORMAL LOW (ref 98–109)
Creatinine: 0.8 mg/dL (ref 0.6–1.1)
GLUCOSE: 96 mg/dL (ref 70–140)
Potassium: 3.3 mEq/L — ABNORMAL LOW (ref 3.5–5.1)
SODIUM: 129 meq/L — AB (ref 136–145)

## 2013-08-24 NOTE — Telephone Encounter (Signed)
Pt confirmed labs/ov per 08/19 POF, gave pt AVS....KJ

## 2013-08-24 NOTE — Progress Notes (Signed)
[facility] [phone]   [phone]  OFFICE PROGRESS NOTE  [name], MD [address] [place] [address]  DIAGNOSIS:  #1 non-Hodgkin's lymphoma diagnosed on bone marrow biopsy in June of 2012  #2 metastatic renal cell carcinoma initially diagnosed as stage III (T3a N0M0) renal cell carcinoma diagnosed in June of 2012 with pulmonary metastasis diagnosed in August of 2014.  PRIOR THERAPY:  1) Status post right nephrectomy under the care of Dr. Diona Fanti on 09/02/2010.  2) Systemic chemotherapy with CHOP/Rituxan given every 3 weeks, status post 6 cycles.   CURRENT THERAPY: Votrient 800 mg by mouth daily. Status post 11 months of treatment.  INTERVAL HISTORY: Melissa Cross 80 y.o. female returns to the clinic today for followup visit accompanied by her son. The patient is feeling fine today with no specific complaints except for mild fatigue and weakness especially in the lower extremities.She is tolerating her treatment with Votrient well. She had another episode of urinary tract infection and was treated with Cipro. She denied having any significant weight loss or night sweats. The patient denied having any chest pain, shortness of breath, cough or hemoptysis. She has no nausea or vomiting. The patient denied having any fever or chills. She had repeat CT scan of the chest, abdomen and pelvis performed recently and she is here for evaluation and discussion of her scan results.   MEDICAL HISTORY: Past Medical History  Diagnosis Date  . Hypercholesteremia     takes Zocor daily  . Glaucoma   . Anemia   . Stroke     hx of TIA  . Blood transfusion   . Thrush 12/17/2010  . Renal cell carcinoma   . Non Hodgkin's lymphoma   . Cancer     kidney cancer, NHL   . Cancer     last chemo 11/04/10   . Hypertension     takes Nadolol daily  . Hypothyroidism     takes Synthroid daily  . Hypothyroid   . PONV (postoperative  nausea and vomiting)   . Shortness of breath     with exertion  . Headache(784.0)     hx of migraines  . TIA (transient ischemic attack)   . Peripheral neuropathy   . Arthritis     knee /hip  . Scoliosis   . History of bladder infections   . History of blood transfusion   . Cataract     right  . History of shingles     ALLERGIES:  is allergic to crab and dilaudid.  MEDICATIONS:  Current Outpatient Prescriptions  Medication Sig Dispense Refill  . amLODipine (NORVASC) 5 MG tablet Take 1 tablet (5 mg total) by mouth daily.  30 tablet  0  . aspirin EC 81 MG tablet Take 81 mg by mouth at bedtime.       . Calcium Citrate (CITRACAL PO) Take 1 tablet by mouth 3 (three) times a week.       . cholecalciferol (VITAMIN D) 400 UNITS TABS Take 400 Units by mouth daily.        . ciprofloxacin (CIPRO) 100 MG tablet Take 1 tablet (100 mg total) by mouth at bedtime.  30 tablet  0  . levothyroxine (SYNTHROID, LEVOTHROID) 50 MCG tablet Take 50 mcg by mouth every morning.       . Multiple Vitamin (MULITIVITAMIN WITH MINERALS) TABS Take 1 tablet by mouth daily.        [name]  nadolol (CORGARD) 40 MG tablet Take 40 mg by mouth at bedtime.       . ondansetron (ZOFRAN ODT) 4 MG disintegrating tablet Take 1 tablet (4 mg total) by mouth every 8 (eight) hours as needed for nausea or vomiting.  30 tablet  0  . pazopanib (VOTRIENT) 200 MG tablet Take 4 tablets (800 mg total) by mouth daily. Take on an empty stomach.  120 tablet  2  . potassium chloride SA (K-DUR,KLOR-CON) 20 MEQ tablet Take 1 tablet (20 mEq total) by mouth 2 (two) times daily.  20 tablet  0  . sodium chloride (MURO 128) 5 % ophthalmic solution Place 1 drop into the left eye 3 (three) times daily.      . vitamin B-12 (CYANOCOBALAMIN) 1000 MCG tablet Place 1,000 mcg under the tongue daily.        No current facility-administered medications for this visit.   Facility-Administered Medications Ordered in Other Visits  Medication Dose Route Frequency  Provider Last Rate Last Dose  . sodium chloride 0.9 % injection 10 mL  10 mL Intravenous PRN Curt Bears, MD   10 mL at 05/17/12 0846    SURGICAL HISTORY:  Past Surgical History  Procedure Laterality Date  . Nephrectomy      Right Side  . Portacath placement      right subclavian  . Tonsillectomy      as a child  . Mouth surgery    . Left cataract removed    . Video bronchoscopy with endobronchial ultrasound N/A 08/06/2012    Procedure: VIDEO BRONCHOSCOPY WITH ENDOBRONCHIAL ULTRASOUND;  Surgeon: Collene Gobble, MD;  Location: [facility];  Service: Thoracic;  Laterality: N/A;  . Video bronchoscopy with endobronchial navigation N/A 08/06/2012    Procedure: VIDEO BRONCHOSCOPY WITH ENDOBRONCHIAL NAVIGATION;  Surgeon: Collene Gobble, MD;  Location: [facility];  Service: Thoracic;  Laterality: N/A;    REVIEW OF SYSTEMS:  Constitutional: positive for fatigue Eyes: negative Ears, nose, mouth, throat, and face: negative Respiratory: negative Cardiovascular: negative Gastrointestinal: negative Genitourinary:negative Integument/breast: negative Hematologic/lymphatic: negative Musculoskeletal:negative Neurological: negative Behavioral/Psych: negative Endocrine: negative Allergic/Immunologic: negative   PHYSICAL EXAMINATION: General appearance: alert, cooperative, fatigued and no distress Head: Normocephalic, without obvious abnormality, atraumatic Neck: no adenopathy Lymph nodes: Cervical, supraclavicular, and axillary nodes normal. Resp: clear to auscultation bilaterally Cardio: regular rate and rhythm, S1, S2 normal, no murmur, click, rub or gallop GI: soft, non-tender; bowel sounds normal; no masses,  no organomegaly Extremities: extremities normal, atraumatic, no cyanosis or edema Neurologic: Alert and oriented X 3, normal strength and tone. Normal symmetric reflexes. Normal coordination and gait  ECOG PERFORMANCE STATUS: 2 - Symptomatic, <50% confined to bed  Blood pressure 160/71,  pulse 62, temperature 97.7 F (36.5 C), temperature source Oral, resp. rate 18, height $RemoveBe'5\' 3"'ngoRrmQTm$  (1.6 m), weight 113 lb 3.2 oz (51.347 kg), SpO2 100.00%.  LABORATORY DATA: Lab Results  Component Value Date   WBC 4.7 08/17/2013   HGB 13.2 08/17/2013   HCT 37.8 08/17/2013   MCV 105.6* 08/17/2013   PLT 268 08/17/2013      Chemistry      Component Value Date/Time   NA 122* 08/17/2013 0915   NA 136* 06/12/2013 0503   K 2.9 Repeated and Verified* 08/17/2013 0915   K 3.4* 06/12/2013 0503   CL 98 06/12/2013 0503   CL 102 04/01/2012 0805   CO2 30* 08/17/2013 0915   CO2 25 06/12/2013 0503   BUN 9.8 08/17/2013 0915   BUN 5*  06/12/2013 0503   CREATININE 0.8 08/17/2013 0915   CREATININE 0.79 06/12/2013 0503      Component Value Date/Time   CALCIUM 9.5 08/17/2013 0915   CALCIUM 8.9 06/12/2013 0503   ALKPHOS 88 08/17/2013 0915   ALKPHOS 94 06/09/2013 2230   AST 25 08/17/2013 0915   AST 23 06/09/2013 2230   ALT 20 08/17/2013 0915   ALT 17 06/09/2013 2230   BILITOT 0.93 08/17/2013 0915   BILITOT 0.9 06/09/2013 2230       RADIOGRAPHIC STUDIES: Ct Abdomen Pelvis Wo Contrast  08/17/2013   CLINICAL DATA:  Large cell lymphoma diagnosed 2007. Renal cell cancer diagnosed 2012. Ongoing chemotherapy. Right nephrectomy.  EXAM: CT CHEST, ABDOMEN AND PELVIS WITHOUT CONTRAST  TECHNIQUE: Multidetector CT imaging of the chest, abdomen and pelvis was performed following the standard protocol without IV contrast.  COMPARISON:  05/04/2013  FINDINGS: CT CHEST FINDINGS  Right Port-A-Cath remains in place, unchanged. Heart is normal size. Slight dilatation of the ascending thoracic aorta measuring 4 cm in greatest diameter, stable. Atherosclerotic disease throughout the aorta and coronary arteries.  No mediastinal, hilar, or axillary adenopathy. Chest wall soft tissues are unremarkable.  Review of the lung windows demonstrates improvement of the right middle lobe nodule which currently measures 6 mm in diameter compared with 13 mm previously. Small  subpleural nodule on image 28 in the right middle lobe posteriorly measures 4 mm, stable, likely intrapulmonary lymph node. No new or enlarging pulmonary nodules. No pleural effusions.  No acute bony abnormality or focal bone lesion.  CT ABDOMEN AND PELVIS FINDINGS  15 mm low-density lesion in the right hepatic lobe is stable. No new visible hepatic lesions on this unenhanced study. Spleen, pancreas, adrenals and left kidney are unremarkable. Prior right nephrectomy. No abnormal soft tissue in the nephrectomy bed.  Stomach, large and small bowel are unremarkable. 2.8 cm cystic lesion in the left adnexa again noted, stable. Stable hyper attenuating area centrally within the uterus. Right ovary and bladder unremarkable.  No free fluid, free air or adenopathy. Aortic calcifications without aneurysm.Degenerative changes in the lumbar spine. No acute or focal bony abnormality.  IMPRESSION: Improving right middle lobe nodule. Stable small subpleural right middle lobe nodule may reflect small intrapulmonary lymph node.  Slight dilatation of the ascending thoracic aorta, 4 cm, stable.  Post right nephrectomy.  No recurrent disease.   Electronically Signed   By: Rolm Baptise M.D.   On: 08/17/2013 11:15   Ct Chest Wo Contrast  08/17/2013   CLINICAL DATA:  Large cell lymphoma diagnosed 2007. Renal cell cancer diagnosed 2012. Ongoing chemotherapy. Right nephrectomy.  EXAM: CT CHEST, ABDOMEN AND PELVIS WITHOUT CONTRAST  TECHNIQUE: Multidetector CT imaging of the chest, abdomen and pelvis was performed following the standard protocol without IV contrast.  COMPARISON:  05/04/2013  FINDINGS: CT CHEST FINDINGS  Right Port-A-Cath remains in place, unchanged. Heart is normal size. Slight dilatation of the ascending thoracic aorta measuring 4 cm in greatest diameter, stable. Atherosclerotic disease throughout the aorta and coronary arteries.  No mediastinal, hilar, or axillary adenopathy. Chest wall soft tissues are unremarkable.   Review of the lung windows demonstrates improvement of the right middle lobe nodule which currently measures 6 mm in diameter compared with 13 mm previously. Small subpleural nodule on image 28 in the right middle lobe posteriorly measures 4 mm, stable, likely intrapulmonary lymph node. No new or enlarging pulmonary nodules. No pleural effusions.  No acute bony abnormality or focal bone lesion.  CT ABDOMEN  AND PELVIS FINDINGS  15 mm low-density lesion in the right hepatic lobe is stable. No new visible hepatic lesions on this unenhanced study. Spleen, pancreas, adrenals and left kidney are unremarkable. Prior right nephrectomy. No abnormal soft tissue in the nephrectomy bed.  Stomach, large and small bowel are unremarkable. 2.8 cm cystic lesion in the left adnexa again noted, stable. Stable hyper attenuating area centrally within the uterus. Right ovary and bladder unremarkable.  No free fluid, free air or adenopathy. Aortic calcifications without aneurysm.Degenerative changes in the lumbar spine. No acute or focal bony abnormality.  IMPRESSION: Improving right middle lobe nodule. Stable small subpleural right middle lobe nodule may reflect small intrapulmonary lymph node.  Slight dilatation of the ascending thoracic aorta, 4 cm, stable.  Post right nephrectomy.  No recurrent disease.   Electronically Signed   By: Rolm Baptise M.D.   On: 08/17/2013 11:15   ASSESSMENT AND PLAN: This is a very pleasant 80 years old white female with metastatic renal cell carcinoma.  She is currently on treatment with Votrient 800 mg by mouth daily status post 11 months of treatment and tolerating it fairly well.  Her recent CT scan of the chest, abdomen and pelvis showed improvement in the right middle lobe nodule with a stable disease in the other lesions. I discussed the scan results with the patient and her son. Because of her persistent fatigue and weakness, I recommended for her to continue on treatment with Votrient but I  would reduce the dose to 600 mg by mouth daily for the next few weeks to see if the patient will tolerate it much better with less fatigue and weakness. If reducing the dose doesn't make any difference for the patient, she would increase her dose back to 800 mg by mouth daily. She is currently on Cipro 100 mg by mouth daily for urinary tract prophylaxis. She would come back for followup visit in one months with repeat blood work in addition to CT scan of the chest, abdomen and pelvis without contrast for restaging of her disease. She was advised to call immediately if she has any concerning symptoms in the interval. The patient voices understanding of current disease status and treatment options and is in agreement with the current care plan.  All questions were answered. The patient knows to call the clinic with any problems, questions or concerns. We can certainly see the patient much sooner if necessary.  Disclaimer: This note was dictated with voice recognition software. Similar sounding words can inadvertently be transcribed and may not be corrected upon review.

## 2013-09-07 ENCOUNTER — Other Ambulatory Visit: Payer: Self-pay | Admitting: Medical Oncology

## 2013-09-07 DIAGNOSIS — C641 Malignant neoplasm of right kidney, except renal pelvis: Secondary | ICD-10-CM

## 2013-09-07 MED ORDER — PAZOPANIB HCL 200 MG PO TABS: 800.0000 mg | ORAL_TABLET | Freq: Every day | ORAL | Status: DC

## 2013-09-07 NOTE — Telephone Encounter (Signed)
Allen called and pt has [employer] pt assistance application for votrient that needs to be faxed with prescription for 30 days + 11 refills. He will bring application for [facility] to sign adn ask ed if the nurse will fax it altogether to [phone].

## 2013-09-13 ENCOUNTER — Telehealth: Payer: Self-pay | Admitting: Medical Oncology

## 2013-09-13 NOTE — Telephone Encounter (Signed)
I called [employer] with rx for Votrient-pt ID number is [id number]. There was a delay with paperwork being processed due to holiday. Verbal order for votrient called to [employer] pt assistance.

## 2013-09-16 NOTE — Telephone Encounter (Signed)
Late Entry 09/14/13:  Packet for patient assistance faxed to [employer] on 09/14/13 [phone]

## 2013-09-21 DIAGNOSIS — H2510 Age-related nuclear cataract, unspecified eye: Secondary | ICD-10-CM | POA: Diagnosis not present

## 2013-09-21 DIAGNOSIS — H3530 Unspecified macular degeneration: Secondary | ICD-10-CM | POA: Diagnosis not present

## 2013-09-21 DIAGNOSIS — H18519 Endothelial corneal dystrophy, unspecified eye: Secondary | ICD-10-CM | POA: Diagnosis not present

## 2013-09-21 DIAGNOSIS — Z961 Presence of intraocular lens: Secondary | ICD-10-CM | POA: Diagnosis not present

## 2013-09-26 ENCOUNTER — Ambulatory Visit (HOSPITAL_BASED_OUTPATIENT_CLINIC_OR_DEPARTMENT_OTHER): Payer: Medicare Other

## 2013-09-26 ENCOUNTER — Ambulatory Visit (HOSPITAL_BASED_OUTPATIENT_CLINIC_OR_DEPARTMENT_OTHER): Payer: Medicare Other | Admitting: Internal Medicine

## 2013-09-26 ENCOUNTER — Encounter: Payer: Self-pay | Admitting: Internal Medicine

## 2013-09-26 ENCOUNTER — Telehealth: Payer: Self-pay | Admitting: Internal Medicine

## 2013-09-26 ENCOUNTER — Other Ambulatory Visit (HOSPITAL_BASED_OUTPATIENT_CLINIC_OR_DEPARTMENT_OTHER): Payer: Medicare Other

## 2013-09-26 VITALS — BP 154/67 | HR 66 | Temp 98.1°F | Resp 18 | Ht 63.0 in | Wt 115.4 lb

## 2013-09-26 DIAGNOSIS — C7800 Secondary malignant neoplasm of unspecified lung: Secondary | ICD-10-CM

## 2013-09-26 DIAGNOSIS — C641 Malignant neoplasm of right kidney, except renal pelvis: Secondary | ICD-10-CM

## 2013-09-26 DIAGNOSIS — C649 Malignant neoplasm of unspecified kidney, except renal pelvis: Secondary | ICD-10-CM

## 2013-09-26 DIAGNOSIS — E876 Hypokalemia: Secondary | ICD-10-CM

## 2013-09-26 DIAGNOSIS — Z95828 Presence of other vascular implants and grafts: Secondary | ICD-10-CM

## 2013-09-26 DIAGNOSIS — Z87898 Personal history of other specified conditions: Secondary | ICD-10-CM

## 2013-09-26 DIAGNOSIS — C8330 Diffuse large B-cell lymphoma, unspecified site: Secondary | ICD-10-CM

## 2013-09-26 LAB — CBC WITH DIFFERENTIAL/PLATELET
BASO%: 0.4 % (ref 0.0–2.0)
BASOS ABS: 0 10*3/uL (ref 0.0–0.1)
EOS ABS: 0.1 10*3/uL (ref 0.0–0.5)
EOS%: 3.3 % (ref 0.0–7.0)
HEMATOCRIT: 37.9 % (ref 34.8–46.6)
HEMOGLOBIN: 13 g/dL (ref 11.6–15.9)
LYMPH#: 0.5 10*3/uL — AB (ref 0.9–3.3)
LYMPH%: 13.6 % — ABNORMAL LOW (ref 14.0–49.7)
MCH: 37.1 pg — ABNORMAL HIGH (ref 25.1–34.0)
MCHC: 34.2 g/dL (ref 31.5–36.0)
MCV: 108.7 fL — ABNORMAL HIGH (ref 79.5–101.0)
MONO#: 0.3 10*3/uL (ref 0.1–0.9)
MONO%: 7.9 % (ref 0.0–14.0)
NEUT%: 74.8 % (ref 38.4–76.8)
NEUTROS ABS: 3 10*3/uL (ref 1.5–6.5)
Platelets: 236 10*3/uL (ref 145–400)
RBC: 3.49 10*6/uL — ABNORMAL LOW (ref 3.70–5.45)
RDW: 15.5 % — ABNORMAL HIGH (ref 11.2–14.5)
WBC: 4 10*3/uL (ref 3.9–10.3)

## 2013-09-26 LAB — COMPREHENSIVE METABOLIC PANEL (CC13)
ALBUMIN: 3.4 g/dL — AB (ref 3.5–5.0)
ALT: 19 U/L (ref 0–55)
ANION GAP: 11 meq/L (ref 3–11)
AST: 22 U/L (ref 5–34)
Alkaline Phosphatase: 83 U/L (ref 40–150)
BUN: 10 mg/dL (ref 7.0–26.0)
CHLORIDE: 97 meq/L — AB (ref 98–109)
CO2: 25 meq/L (ref 22–29)
CREATININE: 0.8 mg/dL (ref 0.6–1.1)
Calcium: 9.2 mg/dL (ref 8.4–10.4)
GLUCOSE: 125 mg/dL (ref 70–140)
POTASSIUM: 3.2 meq/L — AB (ref 3.5–5.1)
Sodium: 132 mEq/L — ABNORMAL LOW (ref 136–145)
Total Bilirubin: 0.55 mg/dL (ref 0.20–1.20)
Total Protein: 6 g/dL — ABNORMAL LOW (ref 6.4–8.3)

## 2013-09-26 LAB — LACTATE DEHYDROGENASE (CC13): LDH: 189 U/L (ref 125–245)

## 2013-09-26 MED ORDER — SODIUM CHLORIDE 0.9 % IJ SOLN
10.0000 mL | INTRAMUSCULAR | Status: DC | PRN
  Administered 2013-09-26: 10 mL via INTRAVENOUS
  Filled 2013-09-26: qty 10

## 2013-09-26 MED ORDER — HEPARIN SOD (PORK) LOCK FLUSH 100 UNIT/ML IV SOLN
500.0000 [IU] | Freq: Once | INTRAVENOUS | Status: AC
  Administered 2013-09-26: 500 [IU] via INTRAVENOUS
  Filled 2013-09-26: qty 5

## 2013-09-26 MED ORDER — POTASSIUM CHLORIDE CRYS ER 20 MEQ PO TBCR: 20.0000 meq | EXTENDED_RELEASE_TABLET | Freq: Two times a day (BID) | ORAL | Status: DC

## 2013-09-26 NOTE — Progress Notes (Signed)
[facility] [phone]   Fax:(336) [phone]  OFFICE PROGRESS NOTE  Melissa Coma, MD [address] [place] [address]  DIAGNOSIS:  #1 non-Hodgkin's lymphoma diagnosed on bone marrow biopsy in June of 2012  #2 metastatic renal cell carcinoma initially diagnosed as stage III (T3a N0M0) renal cell carcinoma diagnosed in June of 2012 with pulmonary metastasis diagnosed in August of 2014.  PRIOR THERAPY:  1) Status post right nephrectomy under the care of Dr. Diona Fanti on 09/02/2010.  2) Systemic chemotherapy with CHOP/Rituxan given every 3 weeks, status post 6 cycles.   CURRENT THERAPY: Votrient 800 mg by mouth daily. Status post 12 months of treatment.  INTERVAL HISTORY: Melissa Cross 80 y.o. female returns to the clinic today for followup visit accompanied by her son. The patient is feeling fine today with no specific complaints. She was off the treatment for 2 weeks but there was no significant change in her fatigue. She notes some improvement in the cough and dysuria when she was off the treatment. She resumed her treatment again 2 weeks ago. She denied having any significant weight loss or night sweats. The patient denied having any chest pain, shortness of breath, cough or hemoptysis. She has no nausea or vomiting. The patient denied having any fever or chills.   MEDICAL HISTORY: Past Medical History  Diagnosis Date  . Hypercholesteremia     takes Zocor daily  . Glaucoma   . Anemia   . Stroke     hx of TIA  . Blood transfusion   . Thrush 12/17/2010  . Renal cell carcinoma   . Non Hodgkin's lymphoma   . Cancer     kidney cancer, NHL   . Cancer     last chemo 11/04/10   . Hypertension     takes Nadolol daily  . Hypothyroidism     takes Synthroid daily  . Hypothyroid   . PONV (postoperative nausea and vomiting)   . Shortness of breath     with exertion  . Headache(784.0)     hx of migraines  . TIA (transient  ischemic attack)   . Peripheral neuropathy   . Arthritis     knee /hip  . Scoliosis   . History of bladder infections   . History of blood transfusion   . Cataract     right  . History of shingles     ALLERGIES:  is allergic to crab and dilaudid.  MEDICATIONS:  Current Outpatient Prescriptions  Medication Sig Dispense Refill  . amLODipine (NORVASC) 5 MG tablet Take 1 tablet (5 mg total) by mouth daily.  30 tablet  0  . aspirin EC 81 MG tablet Take 81 mg by mouth at bedtime.       . Calcium Citrate (CITRACAL PO) Take 1 tablet by mouth 3 (three) times a week.       . cholecalciferol (VITAMIN D) 400 UNITS TABS Take 400 Units by mouth daily.        . ciprofloxacin (CIPRO) 100 MG tablet Take 1 tablet (100 mg total) by mouth at bedtime.  30 tablet  0  . levothyroxine (SYNTHROID, LEVOTHROID) 50 MCG tablet Take 50 mcg by mouth every morning.       . Multiple Vitamin (MULITIVITAMIN WITH MINERALS) TABS Take 1 tablet by mouth daily.        . nadolol (CORGARD) 40 MG tablet Take 40 mg by mouth at bedtime.       [name]  pazopanib (VOTRIENT) 200 MG tablet Take 4 tablets (800 mg total) by mouth daily. Take on an empty stomach.  120 tablet  11  . sodium chloride (MURO 128) 5 % ophthalmic solution Place 1 drop into the left eye 3 (three) times daily.      . vitamin B-12 (CYANOCOBALAMIN) 1000 MCG tablet Place 1,000 mcg under the tongue daily.        No current facility-administered medications for this visit.   Facility-Administered Medications Ordered in Other Visits  Medication Dose Route Frequency Provider Last Rate Last Dose  . sodium chloride 0.9 % injection 10 mL  10 mL Intravenous PRN Curt Bears, MD   10 mL at 05/17/12 0846    SURGICAL HISTORY:  Past Surgical History  Procedure Laterality Date  . Nephrectomy      Right Side  . Portacath placement      right subclavian  . Tonsillectomy      as a child  . Mouth surgery    . Left cataract removed    . Video bronchoscopy with  endobronchial ultrasound N/A 08/06/2012    Procedure: VIDEO BRONCHOSCOPY WITH ENDOBRONCHIAL ULTRASOUND;  Surgeon: Collene Gobble, MD;  Location: [facility];  Service: Thoracic;  Laterality: N/A;  . Video bronchoscopy with endobronchial navigation N/A 08/06/2012    Procedure: VIDEO BRONCHOSCOPY WITH ENDOBRONCHIAL NAVIGATION;  Surgeon: Collene Gobble, MD;  Location: [facility];  Service: Thoracic;  Laterality: N/A;    REVIEW OF SYSTEMS:  Constitutional: positive for fatigue Eyes: negative Ears, nose, mouth, throat, and face: negative Respiratory: negative Cardiovascular: negative Gastrointestinal: negative Genitourinary:negative Integument/breast: negative Hematologic/lymphatic: negative Musculoskeletal:negative Neurological: negative Behavioral/Psych: negative Endocrine: negative Allergic/Immunologic: negative   PHYSICAL EXAMINATION: General appearance: alert, cooperative, fatigued and no distress Head: Normocephalic, without obvious abnormality, atraumatic Neck: no adenopathy Lymph nodes: Cervical, supraclavicular, and axillary nodes normal. Resp: clear to auscultation bilaterally Cardio: regular rate and rhythm, S1, S2 normal, no murmur, click, rub or gallop GI: soft, non-tender; bowel sounds normal; no masses,  no organomegaly Extremities: extremities normal, atraumatic, no cyanosis or edema Neurologic: Alert and oriented X 3, normal strength and tone. Normal symmetric reflexes. Normal coordination and gait  ECOG PERFORMANCE STATUS: 2 - Symptomatic, <50% confined to bed  Blood pressure 154/67, pulse 66, temperature 98.1 F (36.7 C), temperature source Oral, resp. rate 18, height $RemoveBe'5\' 3"'aofGeutYg$  (1.6 m), weight 115 lb 6.4 oz (52.345 kg), SpO2 100.00%.  LABORATORY DATA: Lab Results  Component Value Date   WBC 4.0 09/26/2013   HGB 13.0 09/26/2013   HCT 37.9 09/26/2013   MCV 108.7* 09/26/2013   PLT 236 09/26/2013      Chemistry      Component Value Date/Time   NA 132* 09/26/2013 0914   NA 136*  06/12/2013 0503   K 3.2* 09/26/2013 0914   K 3.4* 06/12/2013 0503   CL 98 06/12/2013 0503   CL 102 04/01/2012 0805   CO2 25 09/26/2013 0914   CO2 25 06/12/2013 0503   BUN 10.0 09/26/2013 0914   BUN 5* 06/12/2013 0503   CREATININE 0.8 09/26/2013 0914   CREATININE 0.79 06/12/2013 0503      Component Value Date/Time   CALCIUM 9.2 09/26/2013 0914   CALCIUM 8.9 06/12/2013 0503   ALKPHOS 83 09/26/2013 0914   ALKPHOS 94 06/09/2013 2230   AST 22 09/26/2013 0914   AST 23 06/09/2013 2230   ALT 19 09/26/2013 0914   ALT 17 06/09/2013 2230   BILITOT 0.55 09/26/2013 0914   BILITOT 0.9  06/09/2013 2230       RADIOGRAPHIC STUDIES:  ASSESSMENT AND PLAN: This is a very pleasant 80 years old white female with metastatic renal cell carcinoma.  She is currently on treatment with Votrient 800 mg by mouth daily status post 12 months of treatment and tolerating it fairly well.  I recommended for the patient to continue her current treatment with the same dose. She would come back for followup visit in one month's for reevaluation with repeat blood work. For the hypokalemia, I will start the patient on KDUR 20 mEq by mouth 3 times a day for 7 days. She was advised to call immediately if she has any concerning symptoms in the interval. The patient voices understanding of current disease status and treatment options and is in agreement with the current care plan.  All questions were answered. The patient knows to call the clinic with any problems, questions or concerns. We can certainly see the patient much sooner if necessary.  Disclaimer: This note was dictated with voice recognition software. Similar sounding words can inadvertently be transcribed and may not be corrected upon review.

## 2013-09-26 NOTE — Patient Instructions (Signed)

## 2013-09-26 NOTE — Telephone Encounter (Signed)
gv adn printed appt sched and avs for pt for OCT

## 2013-10-14 DIAGNOSIS — E876 Hypokalemia: Secondary | ICD-10-CM | POA: Diagnosis not present

## 2013-10-19 DIAGNOSIS — N952 Postmenopausal atrophic vaginitis: Secondary | ICD-10-CM | POA: Diagnosis not present

## 2013-10-19 DIAGNOSIS — C649 Malignant neoplasm of unspecified kidney, except renal pelvis: Secondary | ICD-10-CM | POA: Diagnosis not present

## 2013-10-19 DIAGNOSIS — N3000 Acute cystitis without hematuria: Secondary | ICD-10-CM | POA: Diagnosis not present

## 2013-10-21 ENCOUNTER — Other Ambulatory Visit: Payer: Self-pay

## 2013-10-21 ENCOUNTER — Telehealth: Payer: Self-pay | Admitting: Medical Oncology

## 2013-10-21 ENCOUNTER — Encounter: Payer: Self-pay | Admitting: Internal Medicine

## 2013-10-21 NOTE — Telephone Encounter (Signed)
[employer] will re-evaluate her at end of year for 2016 eligibility.

## 2013-10-21 NOTE — Progress Notes (Signed)
Received letter from Patient [employer].  Pt is approved for Votrient from 10/13/13 to 10/13/14 or when the benefit cap has been met.  Expenses for reimbursement can be submitted from 07/15/13 to 10/13/14.  The amount of the grant is $7,500.

## 2013-10-24 ENCOUNTER — Ambulatory Visit (HOSPITAL_BASED_OUTPATIENT_CLINIC_OR_DEPARTMENT_OTHER): Payer: Medicare Other | Admitting: Physician Assistant

## 2013-10-24 ENCOUNTER — Encounter: Payer: Self-pay | Admitting: Physician Assistant

## 2013-10-24 ENCOUNTER — Other Ambulatory Visit (HOSPITAL_BASED_OUTPATIENT_CLINIC_OR_DEPARTMENT_OTHER): Payer: Medicare Other

## 2013-10-24 ENCOUNTER — Telehealth: Payer: Self-pay | Admitting: Physician Assistant

## 2013-10-24 VITALS — BP 151/70 | HR 63 | Temp 97.8°F | Resp 18 | Ht 63.0 in | Wt 112.8 lb

## 2013-10-24 DIAGNOSIS — C8330 Diffuse large B-cell lymphoma, unspecified site: Secondary | ICD-10-CM

## 2013-10-24 DIAGNOSIS — E876 Hypokalemia: Secondary | ICD-10-CM

## 2013-10-24 DIAGNOSIS — E871 Hypo-osmolality and hyponatremia: Secondary | ICD-10-CM

## 2013-10-24 DIAGNOSIS — C641 Malignant neoplasm of right kidney, except renal pelvis: Secondary | ICD-10-CM

## 2013-10-24 DIAGNOSIS — C649 Malignant neoplasm of unspecified kidney, except renal pelvis: Secondary | ICD-10-CM

## 2013-10-24 LAB — CBC WITH DIFFERENTIAL/PLATELET
BASO%: 0.6 % (ref 0.0–2.0)
BASOS ABS: 0 10*3/uL (ref 0.0–0.1)
EOS%: 4 % (ref 0.0–7.0)
Eosinophils Absolute: 0.2 10*3/uL (ref 0.0–0.5)
HCT: 40.8 % (ref 34.8–46.6)
HGB: 13.6 g/dL (ref 11.6–15.9)
LYMPH#: 0.6 10*3/uL — AB (ref 0.9–3.3)
LYMPH%: 14.7 % (ref 14.0–49.7)
MCH: 36.6 pg — ABNORMAL HIGH (ref 25.1–34.0)
MCHC: 33.4 g/dL (ref 31.5–36.0)
MCV: 109.6 fL — ABNORMAL HIGH (ref 79.5–101.0)
MONO#: 0.4 10*3/uL (ref 0.1–0.9)
MONO%: 8.5 % (ref 0.0–14.0)
NEUT%: 72.2 % (ref 38.4–76.8)
NEUTROS ABS: 3.1 10*3/uL (ref 1.5–6.5)
Platelets: 245 10*3/uL (ref 145–400)
RBC: 3.72 10*6/uL (ref 3.70–5.45)
RDW: 14.7 % — AB (ref 11.2–14.5)
WBC: 4.3 10*3/uL (ref 3.9–10.3)

## 2013-10-24 LAB — COMPREHENSIVE METABOLIC PANEL (CC13)
ALT: 27 U/L (ref 0–55)
AST: 24 U/L (ref 5–34)
Albumin: 3.5 g/dL (ref 3.5–5.0)
Alkaline Phosphatase: 74 U/L (ref 40–150)
Anion Gap: 9 mEq/L (ref 3–11)
BUN: 11.5 mg/dL (ref 7.0–26.0)
CALCIUM: 9.8 mg/dL (ref 8.4–10.4)
CHLORIDE: 101 meq/L (ref 98–109)
CO2: 28 mEq/L (ref 22–29)
CREATININE: 1 mg/dL (ref 0.6–1.1)
Glucose: 125 mg/dl (ref 70–140)
Potassium: 4 mEq/L (ref 3.5–5.1)
Sodium: 138 mEq/L (ref 136–145)
Total Bilirubin: 0.71 mg/dL (ref 0.20–1.20)
Total Protein: 6.1 g/dL — ABNORMAL LOW (ref 6.4–8.3)

## 2013-10-24 LAB — LACTATE DEHYDROGENASE (CC13): LDH: 205 U/L (ref 125–245)

## 2013-10-24 NOTE — Progress Notes (Addendum)
[facility] Telephone:(336) [phone]   Fax:([phone]  SHARED VISIT PROGRESS NOTE  [name], MD [address] [place] [address]  DIAGNOSIS:  #1 non-Hodgkin's lymphoma diagnosed on bone marrow biopsy in June of 2012  #2 metastatic renal cell carcinoma initially diagnosed as stage III (T3a N0M0) renal cell carcinoma diagnosed in June of 2012 with pulmonary metastasis diagnosed in August of 2014.  PRIOR THERAPY:  1) Status post right nephrectomy under the care of Dr. Diona Fanti on 09/02/2010.  2) Systemic chemotherapy with CHOP/Rituxan given every 3 weeks, status post 6 cycles.   CURRENT THERAPY: Votrient 800 mg by mouth daily. Status post 13 months of treatment.  INTERVAL HISTORY: Melissa Cross 80 y.o. female returns to the clinic today for followup visit accompanied by her son. The patient is feeling fine today with no specific complaints, except for decreased by mouth intake. She states that food in general just tastes awful..  She denied having any significant weight loss or night sweats. The patient denied having any chest pain, shortness of breath, cough or hemoptysis. She has no nausea or vomiting. The patient denied having any fever or chills.   MEDICAL HISTORY: Past Medical History  Diagnosis Date  . Hypercholesteremia     takes Zocor daily  . Glaucoma   . Anemia   . Stroke     hx of TIA  . Blood transfusion   . Thrush 12/17/2010  . Renal cell carcinoma   . Non Hodgkin's lymphoma   . Cancer     kidney cancer, NHL   . Cancer     last chemo 11/04/10   . Hypertension     takes Nadolol daily  . Hypothyroidism     takes Synthroid daily  . Hypothyroid   . PONV (postoperative nausea and vomiting)   . Shortness of breath     with exertion  . Headache(784.0)     hx of migraines  . TIA (transient ischemic attack)   . Peripheral neuropathy   . Arthritis     knee /hip  . Scoliosis   . History of bladder infections    . History of blood transfusion   . Cataract     right  . History of shingles     ALLERGIES:  is allergic to crab and dilaudid.  MEDICATIONS:  Current Outpatient Prescriptions  Medication Sig Dispense Refill  . amLODipine (NORVASC) 5 MG tablet Take 1 tablet (5 mg total) by mouth daily.  30 tablet  0  . aspirin EC 81 MG tablet Take 81 mg by mouth at bedtime.       . Calcium Citrate (CITRACAL PO) Take 1 tablet by mouth 3 (three) times a week.       . cholecalciferol (VITAMIN D) 400 UNITS TABS Take 400 Units by mouth daily.        [name] levothyroxine (SYNTHROID, LEVOTHROID) 50 MCG tablet Take 50 mcg by mouth every morning.       . Multiple Vitamin (MULITIVITAMIN WITH MINERALS) TABS Take 1 tablet by mouth daily.        . nadolol (CORGARD) 40 MG tablet Take 40 mg by mouth at bedtime.       . pazopanib (VOTRIENT) 200 MG tablet Take 4 tablets (800 mg total) by mouth daily. Take on an empty stomach.  120 tablet  11  . vitamin B-12 (CYANOCOBALAMIN) 1000 MCG tablet Place 1,000 mcg under the tongue daily.       [name]  ciprofloxacin (CIPRO) 100 MG tablet Take 1 tablet (100 mg total) by mouth at bedtime.  30 tablet  0  . potassium chloride SA (K-DUR,KLOR-CON) 20 MEQ tablet Take 1 tablet (20 mEq total) by mouth 2 (two) times daily.  20 tablet  0  . sodium chloride (MURO 128) 5 % ophthalmic solution Place 1 drop into the left eye 3 (three) times daily.       No current facility-administered medications for this visit.   Facility-Administered Medications Ordered in Other Visits  Medication Dose Route Frequency Provider Last Rate Last Dose  . sodium chloride 0.9 % injection 10 mL  10 mL Intravenous PRN Curt Bears, MD   10 mL at 05/17/12 0846    SURGICAL HISTORY:  Past Surgical History  Procedure Laterality Date  . Nephrectomy      Right Side  . Portacath placement      right subclavian  . Tonsillectomy      as a child  . Mouth surgery    . Left cataract removed    . Video bronchoscopy with  endobronchial ultrasound N/A 08/06/2012    Procedure: VIDEO BRONCHOSCOPY WITH ENDOBRONCHIAL ULTRASOUND;  Surgeon: Collene Gobble, MD;  Location: [facility];  Service: Thoracic;  Laterality: N/A;  . Video bronchoscopy with endobronchial navigation N/A 08/06/2012    Procedure: VIDEO BRONCHOSCOPY WITH ENDOBRONCHIAL NAVIGATION;  Surgeon: Collene Gobble, MD;  Location: [facility];  Service: Thoracic;  Laterality: N/A;    REVIEW OF SYSTEMS:  Constitutional: positive for fatigue and Decreased by mouth intake due to altered taste  Eyes: negative Ears, nose, mouth, throat, and face: negative Respiratory: negative Cardiovascular: negative Gastrointestinal: negative Genitourinary:negative Integument/breast: negative Hematologic/lymphatic: negative Musculoskeletal:negative Neurological: negative Behavioral/Psych: negative Endocrine: negative Allergic/Immunologic: negative   PHYSICAL EXAMINATION: General appearance: alert, cooperative, fatigued and no distress Head: Normocephalic, without obvious abnormality, atraumatic Neck: no adenopathy Lymph nodes: Cervical, supraclavicular, and axillary nodes normal. Resp: clear to auscultation bilaterally Cardio: regular rate and rhythm, S1, S2 normal, no murmur, click, rub or gallop GI: soft, non-tender; bowel sounds normal; no masses,  no organomegaly Extremities: edema 2+ pitting edema left lower extremity, 1+ edema right lower extremity Neurologic: Alert and oriented X 3, normal strength and tone. Normal symmetric reflexes. Normal coordination and gait  ECOG PERFORMANCE STATUS: 2 - Symptomatic, <50% confined to bed  Blood pressure 151/70, pulse 63, temperature 97.8 F (36.6 C), temperature source Oral, resp. rate 18, height $RemoveBe'5\' 3"'KyFPjXerj$  (1.6 m), weight 112 lb 12.8 oz (51.166 kg), SpO2 100.00%.  LABORATORY DATA: Lab Results  Component Value Date   WBC 4.3 10/24/2013   HGB 13.6 10/24/2013   HCT 40.8 10/24/2013   MCV 109.6* 10/24/2013   PLT 245 10/24/2013       Chemistry      Component Value Date/Time   NA 138 10/24/2013 0920   NA 136* 06/12/2013 0503   K 4.0 10/24/2013 0920   K 3.4* 06/12/2013 0503   CL 98 06/12/2013 0503   CL 102 04/01/2012 0805   CO2 28 10/24/2013 0920   CO2 25 06/12/2013 0503   BUN 11.5 10/24/2013 0920   BUN 5* 06/12/2013 0503   CREATININE 1.0 10/24/2013 0920   CREATININE 0.79 06/12/2013 0503      Component Value Date/Time   CALCIUM 9.8 10/24/2013 0920   CALCIUM 8.9 06/12/2013 0503   ALKPHOS 74 10/24/2013 0920   ALKPHOS 94 06/09/2013 2230   AST 24 10/24/2013 0920   AST 23 06/09/2013 2230   ALT 27 10/24/2013  0920   ALT 17 06/09/2013 2230   BILITOT 0.71 10/24/2013 0920   BILITOT 0.9 06/09/2013 2230       RADIOGRAPHIC STUDIES:  Lymphoma, large-cell, diffuse Non-Hodgkin's lymphoma diagnosed on bone marrow biopsy in June 2012, status post 6 cycles of CHOP Rituxan. No known recurrence. We'll continue to follow this on subsequent visits  Renal cell carcinoma Patient is a pleasant 80 year old Caucasian female with metastatic renal cell carcinoma. Currently being treated with.Votrient 800 mg by mouth daily. Overall she's tolerating this treatment without difficulty with the exception of decreased appetite. She will continue on Votrient at the current dose. She'll followup in one month for another symptom management visit with a restaging CT scan her chest, abdomen and pelvis without contrast to reevaluate her disease. Her labs will be drawn from her Port-A-Cath which will be flushed at the same time. This is scheduled for the same day she has her CT scan.  Hypokalemia Patient has been hypokalemic in the past. Her antihypertensive medications have been adjusted removing 1 medication that had 12.5 mg of HCTZ in it. The hypokalemia and has resolved, her potassium level today was 4.0. We'll continue to monitor on subsequent visits.  Hyponatremia Patient's sodium level has been low in the past. This has resolved her sodium today is 138. We'll  continue to monitor this on subsequent visits     ASSESSMENT AND PLAN: This is a very pleasant 79 years old white female with metastatic renal cell carcinoma.  She is currently on treatment with Votrient 800 mg by mouth daily status post 13 months of treatment and tolerating it fairly well. Patient was discussed with an also seen by Dr. Julien Nordmann. She will continue on Votrient at the current dose. She'll followup in one month with repeat CBC differential, C. met and CT of the chest, abdomen and pelvis without contrast to reevaluate her disease. Her labs were drawn from her Port-A-Cath so it to be flushed at the same time. Scheduled labs to occur on the same day of the CT scan. Both her hypokalemia and hyponatremia have resolved. We will continue to monitor these issues closely on subsequent visits. She was advised to call immediately if she has any concerning symptoms in the interval. The patient voices understanding of current disease status and treatment options and is in agreement with the current care plan.  All questions were answered. The patient knows to call the clinic with any problems, questions or concerns. We can certainly see the patient much sooner if necessary.  Carlton Adam PA-C  ADDENDUM: Hematology/Oncology Attending: I had a face to face encounter with the patient today. I recommended her care plan. This is a very pleasant 80 years old white female with metastatic renal cell carcinoma currently undergoing treatment with Votrient status post 13 months of treatment and tolerating it fairly well. The patient denied having any significant chest pain, shortness breath, cough or hemoptysis but she continues to have mild fatigue. I recommended for her to continue her current treatment with Votrient as scheduled. She would come back for followup visit in one month's for reevaluation and repeat blood work. She was advised to call immediately if she has any concerning symptoms in the  interval.  Disclaimer: This note was dictated with voice recognition software. Similar sounding words can inadvertently be transcribed and may not be corrected upon review. Eilleen Kempf., MD 10/24/2013

## 2013-10-24 NOTE — Assessment & Plan Note (Signed)
Patient's sodium level has been low in the past. This has resolved her sodium today is 138. We'll continue to monitor this on subsequent visits

## 2013-10-24 NOTE — Assessment & Plan Note (Signed)
Non-Hodgkin's lymphoma diagnosed on bone marrow biopsy in June 2012, status post 6 cycles of CHOP Rituxan. No known recurrence. We'll continue to follow this on subsequent visits

## 2013-10-24 NOTE — Assessment & Plan Note (Signed)
Patient has been hypokalemic in the past. Her antihypertensive medications have been adjusted removing 1 medication that had 12.5 mg of HCTZ in it. The hypokalemia and has resolved, her potassium level today was 4.0. We'll continue to monitor on subsequent visits.

## 2013-10-24 NOTE — Patient Instructions (Signed)
Continue Votrient at your current dose Followup in one month with a restaging CT scan of your chest, abdomen and pelvis to reevaluate your disease

## 2013-10-24 NOTE — Telephone Encounter (Signed)
Pt confirmed labs/ov per 10/19 POF,  gave pt AVS.... KJ

## 2013-10-24 NOTE — Assessment & Plan Note (Signed)
Patient is a pleasant 80 year old Caucasian female with metastatic renal cell carcinoma. Currently being treated with.Votrient 800 mg by mouth daily. Overall she's tolerating this treatment without difficulty with the exception of decreased appetite. She will continue on Votrient at the current dose. She'll followup in one month for another symptom management visit with a restaging CT scan her chest, abdomen and pelvis without contrast to reevaluate her disease. Her labs will be drawn from her Port-A-Cath which will be flushed at the same time. This is scheduled for the same day she has her CT scan.

## 2013-10-28 DIAGNOSIS — Z23 Encounter for immunization: Secondary | ICD-10-CM | POA: Diagnosis not present

## 2013-11-16 ENCOUNTER — Ambulatory Visit (HOSPITAL_COMMUNITY)
Admission: RE | Admit: 2013-11-16 | Discharge: 2013-11-16 | Disposition: A | Discharge status: Home / Self Care | Payer: Medicare Other | Source: Ambulatory Visit | Attending: Physician Assistant | Admitting: Physician Assistant

## 2013-11-16 ENCOUNTER — Other Ambulatory Visit (HOSPITAL_BASED_OUTPATIENT_CLINIC_OR_DEPARTMENT_OTHER): Payer: Medicare Other

## 2013-11-16 ENCOUNTER — Ambulatory Visit (HOSPITAL_BASED_OUTPATIENT_CLINIC_OR_DEPARTMENT_OTHER): Payer: Medicare Other

## 2013-11-16 VITALS — BP 159/82 | HR 59 | Temp 97.4°F

## 2013-11-16 DIAGNOSIS — K769 Liver disease, unspecified: Secondary | ICD-10-CM | POA: Diagnosis not present

## 2013-11-16 DIAGNOSIS — Z95828 Presence of other vascular implants and grafts: Secondary | ICD-10-CM

## 2013-11-16 DIAGNOSIS — Z905 Acquired absence of kidney: Secondary | ICD-10-CM | POA: Insufficient documentation

## 2013-11-16 DIAGNOSIS — N8320 Unspecified ovarian cysts: Secondary | ICD-10-CM | POA: Diagnosis not present

## 2013-11-16 DIAGNOSIS — D259 Leiomyoma of uterus, unspecified: Secondary | ICD-10-CM | POA: Insufficient documentation

## 2013-11-16 DIAGNOSIS — C8590 Non-Hodgkin lymphoma, unspecified, unspecified site: Secondary | ICD-10-CM | POA: Diagnosis not present

## 2013-11-16 DIAGNOSIS — Z452 Encounter for adjustment and management of vascular access device: Secondary | ICD-10-CM

## 2013-11-16 DIAGNOSIS — C8330 Diffuse large B-cell lymphoma, unspecified site: Secondary | ICD-10-CM

## 2013-11-16 DIAGNOSIS — R911 Solitary pulmonary nodule: Secondary | ICD-10-CM | POA: Diagnosis not present

## 2013-11-16 DIAGNOSIS — C641 Malignant neoplasm of right kidney, except renal pelvis: Secondary | ICD-10-CM

## 2013-11-16 DIAGNOSIS — C649 Malignant neoplasm of unspecified kidney, except renal pelvis: Secondary | ICD-10-CM

## 2013-11-16 DIAGNOSIS — I712 Thoracic aortic aneurysm, without rupture: Secondary | ICD-10-CM | POA: Diagnosis not present

## 2013-11-16 LAB — CBC WITH DIFFERENTIAL/PLATELET
BASO%: 1.2 % (ref 0.0–2.0)
Basophils Absolute: 0.1 10*3/uL (ref 0.0–0.1)
EOS%: 3.5 % (ref 0.0–7.0)
Eosinophils Absolute: 0.1 10*3/uL (ref 0.0–0.5)
HEMATOCRIT: 38.4 % (ref 34.8–46.6)
HGB: 12.9 g/dL (ref 11.6–15.9)
LYMPH%: 14.7 % (ref 14.0–49.7)
MCH: 36.8 pg — AB (ref 25.1–34.0)
MCHC: 33.6 g/dL (ref 31.5–36.0)
MCV: 109.5 fL — ABNORMAL HIGH (ref 79.5–101.0)
MONO#: 0.3 10*3/uL (ref 0.1–0.9)
MONO%: 7.5 % (ref 0.0–14.0)
NEUT#: 3 10*3/uL (ref 1.5–6.5)
NEUT%: 73.1 % (ref 38.4–76.8)
Platelets: 269 10*3/uL (ref 145–400)
RBC: 3.51 10*6/uL — ABNORMAL LOW (ref 3.70–5.45)
RDW: 15.2 % — ABNORMAL HIGH (ref 11.2–14.5)
WBC: 4.1 10*3/uL (ref 3.9–10.3)
lymph#: 0.6 10*3/uL — ABNORMAL LOW (ref 0.9–3.3)

## 2013-11-16 LAB — COMPREHENSIVE METABOLIC PANEL (CC13)
ALT: 17 U/L (ref 0–55)
ANION GAP: 8 meq/L (ref 3–11)
AST: 22 U/L (ref 5–34)
Albumin: 3.5 g/dL (ref 3.5–5.0)
Alkaline Phosphatase: 75 U/L (ref 40–150)
BILIRUBIN TOTAL: 0.54 mg/dL (ref 0.20–1.20)
BUN: 10.3 mg/dL (ref 7.0–26.0)
CALCIUM: 9.5 mg/dL (ref 8.4–10.4)
CHLORIDE: 101 meq/L (ref 98–109)
CO2: 28 mEq/L (ref 22–29)
CREATININE: 0.8 mg/dL (ref 0.6–1.1)
Glucose: 83 mg/dl (ref 70–140)
Potassium: 4.3 mEq/L (ref 3.5–5.1)
Sodium: 137 mEq/L (ref 136–145)
Total Protein: 5.9 g/dL — ABNORMAL LOW (ref 6.4–8.3)

## 2013-11-16 LAB — LACTATE DEHYDROGENASE (CC13): LDH: 227 U/L (ref 125–245)

## 2013-11-16 MED ORDER — HEPARIN SOD (PORK) LOCK FLUSH 100 UNIT/ML IV SOLN
500.0000 [IU] | Freq: Once | INTRAVENOUS | Status: AC
  Administered 2013-11-16: 500 [IU] via INTRAVENOUS
  Filled 2013-11-16: qty 5

## 2013-11-16 MED ORDER — SODIUM CHLORIDE 0.9 % IJ SOLN
10.0000 mL | INTRAMUSCULAR | Status: DC | PRN
  Administered 2013-11-16: 10 mL via INTRAVENOUS
  Filled 2013-11-16: qty 10

## 2013-11-16 NOTE — Patient Instructions (Signed)

## 2013-11-21 ENCOUNTER — Ambulatory Visit: Payer: Medicare Other

## 2013-11-21 ENCOUNTER — Other Ambulatory Visit: Payer: Medicare Other

## 2013-11-21 ENCOUNTER — Telehealth: Payer: Self-pay | Admitting: Internal Medicine

## 2013-11-21 ENCOUNTER — Other Ambulatory Visit: Payer: Self-pay | Admitting: Internal Medicine

## 2013-11-21 ENCOUNTER — Ambulatory Visit (HOSPITAL_BASED_OUTPATIENT_CLINIC_OR_DEPARTMENT_OTHER): Payer: Medicare Other | Admitting: Internal Medicine

## 2013-11-21 ENCOUNTER — Encounter: Payer: Self-pay | Admitting: Internal Medicine

## 2013-11-21 VITALS — BP 160/75 | HR 63 | Temp 97.4°F | Resp 18 | Ht 63.0 in | Wt 111.9 lb

## 2013-11-21 DIAGNOSIS — C641 Malignant neoplasm of right kidney, except renal pelvis: Secondary | ICD-10-CM | POA: Diagnosis not present

## 2013-11-21 DIAGNOSIS — C8330 Diffuse large B-cell lymphoma, unspecified site: Secondary | ICD-10-CM | POA: Diagnosis not present

## 2013-11-21 NOTE — Telephone Encounter (Signed)
gv pt appt schedule for dec.

## 2013-11-21 NOTE — Progress Notes (Signed)
[facility] Telephone:(336) [phone]   Fax:(336) [phone]  OFFICE PROGRESS NOTE  [name], MD [address] [place] [address]  DIAGNOSIS:  #1 non-Hodgkin's lymphoma diagnosed on bone marrow biopsy in June of 2012  #2 metastatic renal cell carcinoma initially diagnosed as stage III (T3a N0M0) renal cell carcinoma diagnosed in June of 2012 with pulmonary metastasis diagnosed in August of 2014.  PRIOR THERAPY:  1) Status post right nephrectomy under the care of Dr. Diona Fanti on 09/02/2010.  2) Systemic chemotherapy with CHOP/Rituxan given every 3 weeks, status post 6 cycles.   CURRENT THERAPY: Votrient 800 mg by mouth daily. Status post 14 months of treatment.  INTERVAL HISTORY: Melissa Cross 80 y.o. female returns to the clinic today for followup visit accompanied by her son. The patient is feeling fine today with no specific complaints except for fatigue. She denied having any significant weight loss or night sweats. The patient denied having any chest pain, shortness of breath, cough or hemoptysis. She has no nausea or vomiting. The patient denied having any fever or chills. She is tolerating her treatment with Votrient fairly well. The patient had repeat CT scan of the chest, abdomen and pelvis performed recently and she is here for evaluation and discussion of her scan results.  MEDICAL HISTORY: Past Medical History  Diagnosis Date  . Hypercholesteremia     takes Zocor daily  . Glaucoma   . Anemia   . Stroke     hx of TIA  . Blood transfusion   . Thrush 12/17/2010  . Renal cell carcinoma   . Non Hodgkin's lymphoma   . Cancer     kidney cancer, NHL   . Cancer     last chemo 11/04/10   . Hypertension     takes Nadolol daily  . Hypothyroidism     takes Synthroid daily  . Hypothyroid   . PONV (postoperative nausea and vomiting)   . Shortness of breath     with exertion  . Headache(784.0)     hx of migraines  . TIA  (transient ischemic attack)   . Peripheral neuropathy   . Arthritis     knee /hip  . Scoliosis   . History of bladder infections   . History of blood transfusion   . Cataract     right  . History of shingles     ALLERGIES:  is allergic to crab and dilaudid.  MEDICATIONS:  Current Outpatient Prescriptions  Medication Sig Dispense Refill  . amLODipine (NORVASC) 5 MG tablet Take 1 tablet (5 mg total) by mouth daily. 30 tablet 0  . aspirin EC 81 MG tablet Take 81 mg by mouth at bedtime.     . Calcium Citrate (CITRACAL PO) Take 1 tablet by mouth 3 (three) times a week.     . cholecalciferol (VITAMIN D) 400 UNITS TABS Take 400 Units by mouth daily.      [name] levothyroxine (SYNTHROID, LEVOTHROID) 50 MCG tablet Take 50 mcg by mouth every morning.     . Multiple Vitamin (MULITIVITAMIN WITH MINERALS) TABS Take 1 tablet by mouth daily.      . nadolol (CORGARD) 40 MG tablet Take 40 mg by mouth at bedtime.     . pazopanib (VOTRIENT) 200 MG tablet Take 4 tablets (800 mg total) by mouth daily. Take on an empty stomach. 120 tablet 11  . vitamin B-12 (CYANOCOBALAMIN) 1000 MCG tablet Place 1,000 mcg under the  tongue daily.     [name] losartan-hydrochlorothiazide (HYZAAR) 50-12.5 MG per tablet Take 1 tablet by mouth daily.  3   No current facility-administered medications for this visit.   Facility-Administered Medications Ordered in Other Visits  Medication Dose Route Frequency Provider Last Rate Last Dose  . sodium chloride 0.9 % injection 10 mL  10 mL Intravenous PRN Curt Bears, MD   10 mL at 05/17/12 0846    SURGICAL HISTORY:  Past Surgical History  Procedure Laterality Date  . Nephrectomy      Right Side  . Portacath placement      right subclavian  . Tonsillectomy      as a child  . Mouth surgery    . Left cataract removed    . Video bronchoscopy with endobronchial ultrasound N/A 08/06/2012    Procedure: VIDEO BRONCHOSCOPY WITH ENDOBRONCHIAL ULTRASOUND;  Surgeon: Collene Gobble, MD;   Location: [facility];  Service: Thoracic;  Laterality: N/A;  . Video bronchoscopy with endobronchial navigation N/A 08/06/2012    Procedure: VIDEO BRONCHOSCOPY WITH ENDOBRONCHIAL NAVIGATION;  Surgeon: Collene Gobble, MD;  Location: [facility];  Service: Thoracic;  Laterality: N/A;    REVIEW OF SYSTEMS:  Constitutional: positive for fatigue Eyes: negative Ears, nose, mouth, throat, and face: negative Respiratory: negative Cardiovascular: negative Gastrointestinal: negative Genitourinary:negative Integument/breast: negative Hematologic/lymphatic: negative Musculoskeletal:negative Neurological: negative Behavioral/Psych: negative Endocrine: negative Allergic/Immunologic: negative   PHYSICAL EXAMINATION: General appearance: alert, cooperative, fatigued and no distress Head: Normocephalic, without obvious abnormality, atraumatic Neck: no adenopathy Lymph nodes: Cervical, supraclavicular, and axillary nodes normal. Resp: clear to auscultation bilaterally Cardio: regular rate and rhythm, S1, S2 normal, no murmur, click, rub or gallop GI: soft, non-tender; bowel sounds normal; no masses,  no organomegaly Extremities: extremities normal, atraumatic, no cyanosis or edema Neurologic: Alert and oriented X 3, normal strength and tone. Normal symmetric reflexes. Normal coordination and gait  ECOG PERFORMANCE STATUS: 2 - Symptomatic, <50% confined to bed  Blood pressure 160/75, pulse 63, temperature 97.4 F (36.3 C), temperature source Oral, resp. rate 18, height $RemoveBe'5\' 3"'AQMRgFlYY$  (1.6 m), weight 111 lb 14.4 oz (50.758 kg), SpO2 100 %.  LABORATORY DATA: Lab Results  Component Value Date   WBC 4.1 11/16/2013   HGB 12.9 11/16/2013   HCT 38.4 11/16/2013   MCV 109.5* 11/16/2013   PLT 269 11/16/2013      Chemistry      Component Value Date/Time   NA 137 11/16/2013 0937   NA 136* 06/12/2013 0503   K 4.3 11/16/2013 0937   K 3.4* 06/12/2013 0503   CL 98 06/12/2013 0503   CL 102 04/01/2012 0805   CO2 28  11/16/2013 0937   CO2 25 06/12/2013 0503   BUN 10.3 11/16/2013 0937   BUN 5* 06/12/2013 0503   CREATININE 0.8 11/16/2013 0937   CREATININE 0.79 06/12/2013 0503      Component Value Date/Time   CALCIUM 9.5 11/16/2013 0937   CALCIUM 8.9 06/12/2013 0503   ALKPHOS 75 11/16/2013 0937   ALKPHOS 94 06/09/2013 2230   AST 22 11/16/2013 0937   AST 23 06/09/2013 2230   ALT 17 11/16/2013 0937   ALT 17 06/09/2013 2230   BILITOT 0.54 11/16/2013 0937   BILITOT 0.9 06/09/2013 2230       RADIOGRAPHIC STUDIES: Ct Abdomen Pelvis Wo Contrast  11/16/2013   CLINICAL DATA:  Restaging for large cell non-Hodgkin's lymphoma and right renal cell carcinoma. Ongoing chemotherapy.  EXAM: CT CHEST, ABDOMEN AND PELVIS WITHOUT CONTRAST  TECHNIQUE: Multidetector  CT imaging of the chest, abdomen and pelvis was performed following the standard protocol without IV contrast.  COMPARISON:  08/17/2013  FINDINGS: CT CHEST FINDINGS  Mediastinum/Hilar Regions: No masses or pathologically enlarged lymph nodes identified.  Other Thoracic Lymphadenopathy:  None.  Lungs: 6 mm nodular density in the right middle lobe is stable. No new or enlarging pulmonary nodules or masses are identified. No evidence acute infiltrate.  Pleura:  No evidence of effusion or mass.  Vascular/Cardiac: 4.1 cm ascending thoracic aortic aneurysm remains stable.  Other:  None.  Musculoskeletal:  No suspicious bone lesions identified.  CT ABDOMEN AND PELVIS FINDINGS  Hepatobiliary: 15 mm low-attenuation lesion in the right hepatic lobe remains stable. No new or enlarging liver lesions seen on this noncontrast study.  Pancreas: No mass or inflammatory process visualized on this non-contrast exam.  Spleen:  Within normal limits in size.  Adrenal Glands:  No mass identified.  Kidneys/Urinary tract: Prior right nephrectomy again noted. No mass is seen in the nephrectomy bed. Left kidney has a normal appearance on this noncontrast study.  Stomach/Bowel/Peritoneum:   Unremarkable.  Vascular/Lymphatic: No pathologically enlarged lymph nodes identified. No other significant abnormality identified.  Reproductive: Small central uterine fibroid measuring 1.4 cm is unchanged. A cystic lesion in the left adnexa measures 2.3 x 3.3 cm and has decreased in size since previous study when it measured 3.1 x 4.0 cm. This is consistent with a benign etiology.  Other:  None.  Musculoskeletal:  No suspicious bone lesions identified.  IMPRESSION: No evidence of recurrent or metastatic neoplasm within the chest, abdomen, or pelvis.  Stable 4.1 cm ascending thoracic aortic aneurysm and sub-cm right middle lobe pulmonary nodule.  Stable small uterine fibroid, and decreased size of cystic lesion in left adnexa consistent with benign etiology.   Electronically Signed   By: Earle Gell M.D.   On: 11/16/2013 13:33   Ct Chest Wo Contrast  11/16/2013   CLINICAL DATA:  Restaging for large cell non-Hodgkin's lymphoma and right renal cell carcinoma. Ongoing chemotherapy.  EXAM: CT CHEST, ABDOMEN AND PELVIS WITHOUT CONTRAST  TECHNIQUE: Multidetector CT imaging of the chest, abdomen and pelvis was performed following the standard protocol without IV contrast.  COMPARISON:  08/17/2013  FINDINGS: CT CHEST FINDINGS  Mediastinum/Hilar Regions: No masses or pathologically enlarged lymph nodes identified.  Other Thoracic Lymphadenopathy:  None.  Lungs: 6 mm nodular density in the right middle lobe is stable. No new or enlarging pulmonary nodules or masses are identified. No evidence acute infiltrate.  Pleura:  No evidence of effusion or mass.  Vascular/Cardiac: 4.1 cm ascending thoracic aortic aneurysm remains stable.  Other:  None.  Musculoskeletal:  No suspicious bone lesions identified.  CT ABDOMEN AND PELVIS FINDINGS  Hepatobiliary: 15 mm low-attenuation lesion in the right hepatic lobe remains stable. No new or enlarging liver lesions seen on this noncontrast study.  Pancreas: No mass or inflammatory  process visualized on this non-contrast exam.  Spleen:  Within normal limits in size.  Adrenal Glands:  No mass identified.  Kidneys/Urinary tract: Prior right nephrectomy again noted. No mass is seen in the nephrectomy bed. Left kidney has a normal appearance on this noncontrast study.  Stomach/Bowel/Peritoneum:  Unremarkable.  Vascular/Lymphatic: No pathologically enlarged lymph nodes identified. No other significant abnormality identified.  Reproductive: Small central uterine fibroid measuring 1.4 cm is unchanged. A cystic lesion in the left adnexa measures 2.3 x 3.3 cm and has decreased in size since previous study when it measured 3.1 x 4.0  cm. This is consistent with a benign etiology.  Other:  None.  Musculoskeletal:  No suspicious bone lesions identified.  IMPRESSION: No evidence of recurrent or metastatic neoplasm within the chest, abdomen, or pelvis.  Stable 4.1 cm ascending thoracic aortic aneurysm and sub-cm right middle lobe pulmonary nodule.  Stable small uterine fibroid, and decreased size of cystic lesion in left adnexa consistent with benign etiology.   Electronically Signed   By: Earle Gell M.D.   On: 11/16/2013 13:33    ASSESSMENT AND PLAN: This is a very pleasant 80 years old white female with metastatic renal cell carcinoma.  She is currently on treatment with Votrient 800 mg by mouth daily status post 14 months of treatment and tolerating it fairly well.  The recent CT scan of the chest, abdomen and pelvis showed no evidence for disease progression. I discussed the scan results with the patient and her son. Because of the persistent fatigue I gave the patient the option of reducing her dose of Votrient to 600 mg by mouth daily. The patient mentions that her fatigue is not that bad and she would like to continue with the full dose 800 mg by mouth daily. She would come back for followup visit in one month for reevaluation with repeat blood work. She was advised to call immediately if  she has any concerning symptoms in the interval. The patient voices understanding of current disease status and treatment options and is in agreement with the current care plan.  All questions were answered. The patient knows to call the clinic with any problems, questions or concerns. We can certainly see the patient much sooner if necessary.  Disclaimer: This note was dictated with voice recognition software. Similar sounding words can inadvertently be transcribed and may not be corrected upon review.

## 2013-11-28 ENCOUNTER — Telehealth: Payer: Self-pay | Admitting: Internal Medicine

## 2013-11-28 NOTE — Telephone Encounter (Signed)
lmonvm for pt re appt for rehab 12/1 @ 11am to arrive 10:30am. pt was aware I would be calling re appt. schedule mailed. [facility] [phone]. per Varney Biles at rehab for [place] and weakness referral should be routed to neuro rehab.

## 2013-11-29 ENCOUNTER — Telehealth: Payer: Self-pay | Admitting: *Deleted

## 2013-11-29 DIAGNOSIS — C8330 Diffuse large B-cell lymphoma, unspecified site: Secondary | ICD-10-CM

## 2013-11-29 NOTE — Telephone Encounter (Signed)
-----   Message from Curt Bears, MD sent at 11/28/2013  7:01 PM EST ----- Regarding: RE: pt referral Colletta [place], Please arrange for home PT with home health. ----- Message -----    From: Ilean China    Sent: 11/28/2013   4:21 PM      To: Curt Bears, MD, Zachery Dakins Margret Chance Subject: pt referral                                    Hello,  Pt called to advised that is was looking for pt inside the home. If you feel she needs please enter referral

## 2013-11-29 NOTE — Telephone Encounter (Signed)
Order for PT at home and nursing home care with [facility] made in EPIC.

## 2013-12-06 ENCOUNTER — Ambulatory Visit: Payer: Medicare Other | Admitting: Physical Therapy

## 2013-12-08 DIAGNOSIS — K1120 Sialoadenitis, unspecified: Secondary | ICD-10-CM | POA: Diagnosis not present

## 2013-12-08 DIAGNOSIS — E039 Hypothyroidism, unspecified: Secondary | ICD-10-CM | POA: Diagnosis not present

## 2013-12-08 DIAGNOSIS — C649 Malignant neoplasm of unspecified kidney, except renal pelvis: Secondary | ICD-10-CM | POA: Diagnosis not present

## 2013-12-08 DIAGNOSIS — C8599 Non-Hodgkin lymphoma, unspecified, extranodal and solid organ sites: Secondary | ICD-10-CM | POA: Diagnosis not present

## 2013-12-08 DIAGNOSIS — I10 Essential (primary) hypertension: Secondary | ICD-10-CM | POA: Diagnosis not present

## 2013-12-08 DIAGNOSIS — D649 Anemia, unspecified: Secondary | ICD-10-CM | POA: Diagnosis not present

## 2013-12-08 DIAGNOSIS — M899 Disorder of bone, unspecified: Secondary | ICD-10-CM | POA: Diagnosis not present

## 2013-12-08 DIAGNOSIS — E785 Hyperlipidemia, unspecified: Secondary | ICD-10-CM | POA: Diagnosis not present

## 2013-12-21 ENCOUNTER — Ambulatory Visit (HOSPITAL_BASED_OUTPATIENT_CLINIC_OR_DEPARTMENT_OTHER): Payer: Medicare Other | Admitting: Physician Assistant

## 2013-12-21 ENCOUNTER — Telehealth: Payer: Self-pay | Admitting: Physician Assistant

## 2013-12-21 ENCOUNTER — Encounter: Payer: Self-pay | Admitting: Physician Assistant

## 2013-12-21 ENCOUNTER — Other Ambulatory Visit (HOSPITAL_BASED_OUTPATIENT_CLINIC_OR_DEPARTMENT_OTHER): Payer: Medicare Other

## 2013-12-21 VITALS — BP 158/75 | HR 62 | Temp 97.5°F | Resp 18 | Ht 63.0 in | Wt 112.1 lb

## 2013-12-21 DIAGNOSIS — C8330 Diffuse large B-cell lymphoma, unspecified site: Secondary | ICD-10-CM

## 2013-12-21 DIAGNOSIS — C7800 Secondary malignant neoplasm of unspecified lung: Secondary | ICD-10-CM | POA: Diagnosis not present

## 2013-12-21 DIAGNOSIS — C8590 Non-Hodgkin lymphoma, unspecified, unspecified site: Secondary | ICD-10-CM | POA: Diagnosis not present

## 2013-12-21 DIAGNOSIS — C641 Malignant neoplasm of right kidney, except renal pelvis: Secondary | ICD-10-CM | POA: Diagnosis not present

## 2013-12-21 DIAGNOSIS — K1120 Sialoadenitis, unspecified: Secondary | ICD-10-CM

## 2013-12-21 LAB — CBC WITH DIFFERENTIAL/PLATELET
BASO%: 0.5 % (ref 0.0–2.0)
Basophils Absolute: 0 10*3/uL (ref 0.0–0.1)
EOS ABS: 0.1 10*3/uL (ref 0.0–0.5)
EOS%: 3.5 % (ref 0.0–7.0)
HCT: 40.2 % (ref 34.8–46.6)
HGB: 13 g/dL (ref 11.6–15.9)
LYMPH%: 16.7 % (ref 14.0–49.7)
MCH: 35.6 pg — ABNORMAL HIGH (ref 25.1–34.0)
MCHC: 32.4 g/dL (ref 31.5–36.0)
MCV: 109.8 fL — ABNORMAL HIGH (ref 79.5–101.0)
MONO#: 0.3 10*3/uL (ref 0.1–0.9)
MONO%: 8.7 % (ref 0.0–14.0)
NEUT%: 70.6 % (ref 38.4–76.8)
NEUTROS ABS: 2.8 10*3/uL (ref 1.5–6.5)
Platelets: 255 10*3/uL (ref 145–400)
RBC: 3.66 10*6/uL — AB (ref 3.70–5.45)
RDW: 14.8 % — AB (ref 11.2–14.5)
WBC: 4 10*3/uL (ref 3.9–10.3)
lymph#: 0.7 10*3/uL — ABNORMAL LOW (ref 0.9–3.3)

## 2013-12-21 LAB — COMPREHENSIVE METABOLIC PANEL (CC13)
ALT: 15 U/L (ref 0–55)
ANION GAP: 9 meq/L (ref 3–11)
AST: 22 U/L (ref 5–34)
Albumin: 3.5 g/dL (ref 3.5–5.0)
Alkaline Phosphatase: 79 U/L (ref 40–150)
BUN: 10.6 mg/dL (ref 7.0–26.0)
CO2: 30 meq/L — AB (ref 22–29)
Calcium: 9.6 mg/dL (ref 8.4–10.4)
Chloride: 102 mEq/L (ref 98–109)
Creatinine: 0.8 mg/dL (ref 0.6–1.1)
EGFR: 66 mL/min/{1.73_m2} — AB (ref >90)
GLUCOSE: 90 mg/dL (ref 70–140)
POTASSIUM: 4.2 meq/L (ref 3.5–5.1)
Sodium: 141 mEq/L (ref 136–145)
TOTAL PROTEIN: 5.9 g/dL — AB (ref 6.4–8.3)
Total Bilirubin: 0.4 mg/dL (ref 0.20–1.20)

## 2013-12-21 LAB — LACTATE DEHYDROGENASE (CC13): LDH: 193 U/L (ref 125–245)

## 2013-12-21 NOTE — Telephone Encounter (Signed)
Gave avs & cal for Jan.

## 2013-12-21 NOTE — Patient Instructions (Signed)
Continue Votrient 800 mg by mouth daily Follow up in one month

## 2013-12-21 NOTE — Progress Notes (Addendum)
[place] [phone]   Fax:(336) [phone]  OFFICE PROGRESS NOTE  [name], MD [address] [place] [address]  DIAGNOSIS:  #1 non-Hodgkin's lymphoma diagnosed on bone marrow biopsy in June of 2012  #2 metastatic renal cell carcinoma initially diagnosed as stage III (T3a N0M0) renal cell carcinoma diagnosed in June of 2012 with pulmonary metastasis diagnosed in August of 2014.  PRIOR THERAPY:  1) Status post right nephrectomy under the care of Dr. Diona Fanti on 09/02/2010.  2) Systemic chemotherapy with CHOP/Rituxan given every 3 weeks, status post 6 cycles.   CURRENT THERAPY: Votrient 800 mg by mouth daily. Status post 15 months of treatment.  INTERVAL HISTORY: Melissa Cross 80 y.o. female returns to the clinic today for followup visit accompanied by her son. The patient is feeling fine today with no specific complaints except for fatigue. The fatigue remains at her baseline level. She denied having any significant weight loss or night sweats. The patient denied having any chest pain, shortness of breath, cough or hemoptysis. She has no nausea or vomiting. The patient denied having any fever or chills. She is tolerating her treatment with Votrient fairly well. She requests a referral for home physical therapy as she does not drive and is unsteady on her feet. She reports she is being treated for a right  "parotiditis". She's had tenderness in that right "salivary gland. She was told to eat sour things and if he didn't get any better to get her prescription for Augmentin filled. She has been drinking cranberry juice which she finds to be sour. She will complete her course of Augmentin today. She still has some tenderness in this area. She'll follow-up with her primary care physician regarding this issue. She reports that she is scheduled for a cornea transplant with preop per dose studies on 01/16/2014, surgery planned for  01/26/2014 in follow-up on 01/27/2014. This is with a ophthalmologists out of [facility].  MEDICAL HISTORY: Past Medical History  Diagnosis Date  . Hypercholesteremia     takes Zocor daily  . Glaucoma   . Anemia   . Stroke     hx of TIA  . Blood transfusion   . Thrush 12/17/2010  . Renal cell carcinoma   . Non Hodgkin's lymphoma   . Cancer     kidney cancer, NHL   . Cancer     last chemo 11/04/10   . Hypertension     takes Nadolol daily  . Hypothyroidism     takes Synthroid daily  . Hypothyroid   . PONV (postoperative nausea and vomiting)   . Shortness of breath     with exertion  . Headache(784.0)     hx of migraines  . TIA (transient ischemic attack)   . Peripheral neuropathy   . Arthritis     knee /hip  . Scoliosis   . History of bladder infections   . History of blood transfusion   . Cataract     right  . History of shingles     ALLERGIES:  is allergic to crab and dilaudid.  MEDICATIONS:  Current Outpatient Prescriptions  Medication Sig Dispense Refill  . amLODipine (NORVASC) 5 MG tablet Take 1 tablet (5 mg total) by mouth daily. 30 tablet 0  . aspirin EC 81 MG tablet Take 81 mg by mouth at bedtime.     . Calcium Citrate (CITRACAL PO) Take 1 tablet by mouth 3 (three)  times a week.     . cholecalciferol (VITAMIN D) 400 UNITS TABS Take 400 Units by mouth daily.      [name] levothyroxine (SYNTHROID, LEVOTHROID) 50 MCG tablet Take 50 mcg by mouth every morning.     . Multiple Vitamin (MULITIVITAMIN WITH MINERALS) TABS Take 1 tablet by mouth daily.      . nadolol (CORGARD) 40 MG tablet Take 40 mg by mouth at bedtime.     . pazopanib (VOTRIENT) 200 MG tablet Take 4 tablets (800 mg total) by mouth daily. Take on an empty stomach. 120 tablet 11  . vitamin B-12 (CYANOCOBALAMIN) 1000 MCG tablet Place 1,000 mcg under the tongue daily.      No current facility-administered medications for this visit.   Facility-Administered Medications Ordered in Other  Visits  Medication Dose Route Frequency Provider Last Rate Last Dose  . sodium chloride 0.9 % injection 10 mL  10 mL Intravenous PRN Curt Bears, MD   10 mL at 05/17/12 0846    SURGICAL HISTORY:  Past Surgical History  Procedure Laterality Date  . Nephrectomy      Right Side  . Portacath placement      right subclavian  . Tonsillectomy      as a child  . Mouth surgery    . Left cataract removed    . Video bronchoscopy with endobronchial ultrasound N/A 08/06/2012    Procedure: VIDEO BRONCHOSCOPY WITH ENDOBRONCHIAL ULTRASOUND;  Surgeon: Collene Gobble, MD;  Location: [facility];  Service: Thoracic;  Laterality: N/A;  . Video bronchoscopy with endobronchial navigation N/A 08/06/2012    Procedure: VIDEO BRONCHOSCOPY WITH ENDOBRONCHIAL NAVIGATION;  Surgeon: Collene Gobble, MD;  Location: [facility];  Service: Thoracic;  Laterality: N/A;    REVIEW OF SYSTEMS:  Constitutional: positive for fatigue Eyes: negative Ears, nose, mouth, throat, and face: positive for right salivary gland/parotid gland infection/inflammation Respiratory: negative Cardiovascular: negative Gastrointestinal: negative Genitourinary:negative Integument/breast: negative Hematologic/lymphatic: negative Musculoskeletal:negative Neurological: positive for gait problems Behavioral/Psych: negative Endocrine: negative Allergic/Immunologic: negative   PHYSICAL EXAMINATION: General appearance: alert, cooperative, fatigued and no distress Head: Normocephalic, without obvious abnormality, atraumatic Neck: no adenopathy Lymph nodes: Cervical, supraclavicular, and axillary nodes normal. Resp: clear to auscultation bilaterally Cardio: regular rate and rhythm, S1, S2 normal, no murmur, click, rub or gallop GI: soft, non-tender; bowel sounds normal; no masses,  no organomegaly Extremities: extremities normal, atraumatic, no cyanosis or edema Neurologic: Grossly normal  ECOG PERFORMANCE STATUS: 2 - Symptomatic, <50% confined to  bed  Blood pressure 158/75, pulse 62, temperature 97.5 F (36.4 C), temperature source Oral, resp. rate 18, height $RemoveBe'5\' 3"'[id number]$  (1.6 m), weight 112 lb 1.6 oz (50.848 kg), SpO2 99 %.  LABORATORY DATA: Lab Results  Component Value Date   WBC 4.0 12/21/2013   HGB 13.0 12/21/2013   HCT 40.2 12/21/2013   MCV 109.8* 12/21/2013   PLT 255 12/21/2013      Chemistry      Component Value Date/Time   NA 141 12/21/2013 1043   NA 136* 06/12/2013 0503   K 4.2 12/21/2013 1043   K 3.4* 06/12/2013 0503   CL 98 06/12/2013 0503   CL 102 04/01/2012 0805   CO2 30* 12/21/2013 1043   CO2 25 06/12/2013 0503   BUN 10.6 12/21/2013 1043   BUN 5* 06/12/2013 0503   CREATININE 0.8 12/21/2013 1043   CREATININE 0.79 06/12/2013 0503      Component Value Date/Time   CALCIUM 9.6 12/21/2013 1043   CALCIUM 8.9 06/12/2013 0503  ALKPHOS 79 12/21/2013 1043   ALKPHOS 94 06/09/2013 2230   AST 22 12/21/2013 1043   AST 23 06/09/2013 2230   ALT 15 12/21/2013 1043   ALT 17 06/09/2013 2230   BILITOT 0.40 12/21/2013 1043   BILITOT 0.9 06/09/2013 2230       RADIOGRAPHIC STUDIES: No results found.  ASSESSMENT AND PLAN: This is a very pleasant 80 years old white female with metastatic renal cell carcinoma.  She is currently on treatment with Votrient 800 mg by mouth daily status post 14 months of treatment and tolerating it fairly well.  The recent CT scan of the chest, abdomen and pelvis showed no evidence for disease progression. The patient was discussed with and also seen by Dr. Julien Nordmann. She will continue on Votrient 800 mg by mouth daily. We will make a referral to [facility] for Home Physical Therapy and Occupational Therapy. She will follow-up in one month for another symptom management visit and we will schedule this to occur either January 18 or 01/24/2014 with repeat labs. She did inquire about the shingles vaccine however we do not recommend this vaccine for her as it is a live vaccine. Patient and her  son both voiced understanding at this recommendation.   She will follow-up with her primary care physician regarding her inflamed right salivary gland.  She was advised to call immediately if she has any concerning symptoms in the interval. The patient voices understanding of current disease status and treatment options and is in agreement with the current care plan.  All questions were answered. The patient knows to call the clinic with any problems, questions or concerns. We can certainly see the patient much sooner if necessary.  Carlton Adam, PA-C 12/21/2013  ADDENDUM: Hematology/Oncology Attending: I had a face to face encounter with the patient. I recommended her care plan. This is a very pleasant 80 years old white female with metastatic renal cell carcinoma currently on treatment with Votrient 800 mg by mouth daily status post 15 months of treatment. She is tolerating her treatment well with no significant adverse effects except for mild fatigue. I discussed with the patient reducing her dose of Votrient to 600 mg by mouth daily but she declined. She will continue with the current dose for now. She will come back for follow-up visit in one month for reevaluation after repeating CBC and comprehensive metabolic panel. She is requesting home PT and OT to help with her fatigue and weakness. We will contact advanced home care to see the patient at home. She was advised to call immediately if she has any concerning symptoms in the interval.  Disclaimer: This note was dictated with voice recognition software. Similar sounding words can inadvertently be transcribed and may not be corrected upon review. Eilleen Kempf., MD 12/21/2013

## 2014-01-16 DIAGNOSIS — R600 Localized edema: Secondary | ICD-10-CM | POA: Diagnosis not present

## 2014-01-16 DIAGNOSIS — Z961 Presence of intraocular lens: Secondary | ICD-10-CM | POA: Diagnosis not present

## 2014-01-16 DIAGNOSIS — Z01810 Encounter for preprocedural cardiovascular examination: Secondary | ICD-10-CM | POA: Diagnosis not present

## 2014-01-16 DIAGNOSIS — Z8673 Personal history of transient ischemic attack (TIA), and cerebral infarction without residual deficits: Secondary | ICD-10-CM | POA: Diagnosis not present

## 2014-01-16 DIAGNOSIS — Z7982 Long term (current) use of aspirin: Secondary | ICD-10-CM | POA: Diagnosis not present

## 2014-01-16 DIAGNOSIS — E039 Hypothyroidism, unspecified: Secondary | ICD-10-CM | POA: Diagnosis not present

## 2014-01-16 DIAGNOSIS — I10 Essential (primary) hypertension: Secondary | ICD-10-CM | POA: Diagnosis not present

## 2014-01-16 DIAGNOSIS — H269 Unspecified cataract: Secondary | ICD-10-CM | POA: Diagnosis not present

## 2014-01-16 DIAGNOSIS — H1851 Endothelial corneal dystrophy: Secondary | ICD-10-CM | POA: Diagnosis not present

## 2014-01-16 DIAGNOSIS — R9431 Abnormal electrocardiogram [ECG] [EKG]: Secondary | ICD-10-CM | POA: Diagnosis not present

## 2014-01-18 ENCOUNTER — Telehealth: Payer: Self-pay | Admitting: Medical Oncology

## 2014-01-18 NOTE — Telephone Encounter (Signed)
Per Dr Julien Nordmann I  instructed pt to stop Votrient for 1 week prior  To eye surgery.

## 2014-01-24 ENCOUNTER — Other Ambulatory Visit (HOSPITAL_BASED_OUTPATIENT_CLINIC_OR_DEPARTMENT_OTHER): Payer: Medicare Other

## 2014-01-24 ENCOUNTER — Telehealth: Payer: Self-pay | Admitting: Internal Medicine

## 2014-01-24 ENCOUNTER — Ambulatory Visit (HOSPITAL_BASED_OUTPATIENT_CLINIC_OR_DEPARTMENT_OTHER): Payer: Medicare Other

## 2014-01-24 ENCOUNTER — Encounter: Payer: Self-pay | Admitting: Internal Medicine

## 2014-01-24 ENCOUNTER — Ambulatory Visit (HOSPITAL_BASED_OUTPATIENT_CLINIC_OR_DEPARTMENT_OTHER): Payer: Medicare Other | Admitting: Internal Medicine

## 2014-01-24 VITALS — BP 126/64 | HR 71 | Temp 98.1°F | Resp 18 | Ht 63.0 in | Wt 116.1 lb

## 2014-01-24 DIAGNOSIS — C641 Malignant neoplasm of right kidney, except renal pelvis: Secondary | ICD-10-CM | POA: Diagnosis not present

## 2014-01-24 DIAGNOSIS — C8330 Diffuse large B-cell lymphoma, unspecified site: Secondary | ICD-10-CM

## 2014-01-24 DIAGNOSIS — C7800 Secondary malignant neoplasm of unspecified lung: Secondary | ICD-10-CM

## 2014-01-24 DIAGNOSIS — Z95828 Presence of other vascular implants and grafts: Secondary | ICD-10-CM

## 2014-01-24 DIAGNOSIS — C649 Malignant neoplasm of unspecified kidney, except renal pelvis: Secondary | ICD-10-CM

## 2014-01-24 LAB — COMPREHENSIVE METABOLIC PANEL (CC13)
ALBUMIN: 3.2 g/dL — AB (ref 3.5–5.0)
ALT: 10 U/L (ref 0–55)
AST: 18 U/L (ref 5–34)
Alkaline Phosphatase: 83 U/L (ref 40–150)
Anion Gap: 10 mEq/L (ref 3–11)
BUN: 14.7 mg/dL (ref 7.0–26.0)
CO2: 25 mEq/L (ref 22–29)
CREATININE: 0.8 mg/dL (ref 0.6–1.1)
Calcium: 8.8 mg/dL (ref 8.4–10.4)
Chloride: 104 mEq/L (ref 98–109)
EGFR: 67 mL/min/{1.73_m2} — ABNORMAL LOW (ref >90)
Glucose: 120 mg/dl (ref 70–140)
POTASSIUM: 4.2 meq/L (ref 3.5–5.1)
Sodium: 139 mEq/L (ref 136–145)
Total Bilirubin: 0.38 mg/dL (ref 0.20–1.20)
Total Protein: 5.3 g/dL — ABNORMAL LOW (ref 6.4–8.3)

## 2014-01-24 LAB — CBC WITH DIFFERENTIAL/PLATELET
BASO%: 0.2 % (ref 0.0–2.0)
BASOS ABS: 0 10*3/uL (ref 0.0–0.1)
EOS%: 2.1 % (ref 0.0–7.0)
Eosinophils Absolute: 0.1 10*3/uL (ref 0.0–0.5)
HCT: 37.1 % (ref 34.8–46.6)
HEMOGLOBIN: 12.1 g/dL (ref 11.6–15.9)
LYMPH#: 0.6 10*3/uL — AB (ref 0.9–3.3)
LYMPH%: 9.3 % — ABNORMAL LOW (ref 14.0–49.7)
MCH: 35.3 pg — ABNORMAL HIGH (ref 25.1–34.0)
MCHC: 32.6 g/dL (ref 31.5–36.0)
MCV: 108.2 fL — AB (ref 79.5–101.0)
MONO#: 0.5 10*3/uL (ref 0.1–0.9)
MONO%: 7.2 % (ref 0.0–14.0)
NEUT#: 5.1 10*3/uL (ref 1.5–6.5)
NEUT%: 81.2 % — ABNORMAL HIGH (ref 38.4–76.8)
Platelets: 226 10*3/uL (ref 145–400)
RBC: 3.43 10*6/uL — ABNORMAL LOW (ref 3.70–5.45)
RDW: 14.7 % — ABNORMAL HIGH (ref 11.2–14.5)
WBC: 6.3 10*3/uL (ref 3.9–10.3)

## 2014-01-24 LAB — LACTATE DEHYDROGENASE (CC13): LDH: 181 U/L (ref 125–245)

## 2014-01-24 MED ORDER — HEPARIN SOD (PORK) LOCK FLUSH 100 UNIT/ML IV SOLN
500.0000 [IU] | Freq: Once | INTRAVENOUS | Status: AC
  Administered 2014-01-24: 500 [IU] via INTRAVENOUS
  Filled 2014-01-24: qty 5

## 2014-01-24 MED ORDER — SODIUM CHLORIDE 0.9 % IJ SOLN
10.0000 mL | INTRAMUSCULAR | Status: DC | PRN
  Administered 2014-01-24: 10 mL via INTRAVENOUS
  Filled 2014-01-24: qty 10

## 2014-01-24 NOTE — Progress Notes (Signed)
[place] Telephone:([phone]   Fax:(336) [phone]  OFFICE PROGRESS NOTE  [name], MD [address] [place] [address]  DIAGNOSIS:  #1 non-Hodgkin's lymphoma diagnosed on bone marrow biopsy in June of 2012  #2 metastatic renal cell carcinoma initially diagnosed as stage III (T3a N0M0) renal cell carcinoma diagnosed in June of 2012 with pulmonary metastasis diagnosed in August of 2014.  PRIOR THERAPY:  1) Status post right nephrectomy under the care of Dr. Diona Fanti on 09/02/2010.  2) Systemic chemotherapy with CHOP/Rituxan given every 3 weeks, status post 6 cycles.   CURRENT THERAPY: Votrient 800 mg by mouth daily. Status post 15 months of treatment.  INTERVAL HISTORY: Melissa Cross 81 y.o. female returns to the clinic today for followup visit accompanied by her son. The patient is feeling fine today with no specific complaints except for fatigue. She is currently off treatment with Votrient for the last 6 days in preparation for an eye surgery. She denied having any significant weight loss or night sweats. The patient denied having any chest pain, shortness of breath, cough or hemoptysis. She has no nausea or vomiting. The patient denied having any fever or chills. She is tolerating her treatment with Votrient fairly well and she will resume her treatment after her surgery.  MEDICAL HISTORY: Past Medical History  Diagnosis Date  . Hypercholesteremia     takes Zocor daily  . Glaucoma   . Anemia   . Stroke     hx of TIA  . Blood transfusion   . Thrush 12/17/2010  . Renal cell carcinoma   . Non Hodgkin's lymphoma   . Cancer     kidney cancer, NHL   . Cancer     last chemo 11/04/10   . Hypertension     takes Nadolol daily  . Hypothyroidism     takes Synthroid daily  . Hypothyroid   . PONV (postoperative nausea and vomiting)   . Shortness of breath     with exertion  . Headache(784.0)     hx of migraines  . TIA  (transient ischemic attack)   . Peripheral neuropathy   . Arthritis     knee /hip  . Scoliosis   . History of bladder infections   . History of blood transfusion   . Cataract     right  . History of shingles     ALLERGIES:  is allergic to crab and dilaudid.  MEDICATIONS:  Current Outpatient Prescriptions  Medication Sig Dispense Refill  . amLODipine (NORVASC) 5 MG tablet Take 1 tablet (5 mg total) by mouth daily. 30 tablet 0  . Calcium Citrate (CITRACAL PO) Take 1 tablet by mouth 3 (three) times a week.     . cholecalciferol (VITAMIN D) 400 UNITS TABS Take 400 Units by mouth daily.      [name] levothyroxine (SYNTHROID, LEVOTHROID) 50 MCG tablet Take 50 mcg by mouth every morning.     . Multiple Vitamin (MULITIVITAMIN WITH MINERALS) TABS Take 1 tablet by mouth daily.      . nadolol (CORGARD) 40 MG tablet Take 40 mg by mouth at bedtime.     . vitamin B-12 (CYANOCOBALAMIN) 1000 MCG tablet Place 1,000 mcg under the tongue daily.     [name] aspirin EC 81 MG tablet Take 81 mg by mouth at bedtime.     . pazopanib (VOTRIENT) 200 MG tablet Take 4 tablets (800 mg total) by mouth daily. Take on  an empty stomach. (Patient not taking: Reported on 01/24/2014) 120 tablet 11   No current facility-administered medications for this visit.   Facility-Administered Medications Ordered in Other Visits  Medication Dose Route Frequency Provider Last Rate Last Dose  . sodium chloride 0.9 % injection 10 mL  10 mL Intravenous PRN Curt Bears, MD   10 mL at 05/17/12 0846    SURGICAL HISTORY:  Past Surgical History  Procedure Laterality Date  . Nephrectomy      Right Side  . Portacath placement      right subclavian  . Tonsillectomy      as a child  . Mouth surgery    . Left cataract removed    . Video bronchoscopy with endobronchial ultrasound N/A 08/06/2012    Procedure: VIDEO BRONCHOSCOPY WITH ENDOBRONCHIAL ULTRASOUND;  Surgeon: Collene Gobble, MD;  Location: [facility];  Service: Thoracic;  Laterality: N/A;    . Video bronchoscopy with endobronchial navigation N/A 08/06/2012    Procedure: VIDEO BRONCHOSCOPY WITH ENDOBRONCHIAL NAVIGATION;  Surgeon: Collene Gobble, MD;  Location: [facility];  Service: Thoracic;  Laterality: N/A;    REVIEW OF SYSTEMS:  Constitutional: positive for fatigue Eyes: negative Ears, nose, mouth, throat, and face: negative Respiratory: negative Cardiovascular: negative Gastrointestinal: negative Genitourinary:negative Integument/breast: negative Hematologic/lymphatic: negative Musculoskeletal:negative Neurological: negative Behavioral/Psych: negative Endocrine: negative Allergic/Immunologic: negative   PHYSICAL EXAMINATION: General appearance: alert, cooperative, fatigued and no distress Head: Normocephalic, without obvious abnormality, atraumatic Neck: no adenopathy Lymph nodes: Cervical, supraclavicular, and axillary nodes normal. Resp: clear to auscultation bilaterally Cardio: regular rate and rhythm, S1, S2 normal, no murmur, click, rub or gallop GI: soft, non-tender; bowel sounds normal; no masses,  no organomegaly Extremities: extremities normal, atraumatic, no cyanosis or edema Neurologic: Alert and oriented X 3, normal strength and tone. Normal symmetric reflexes. Normal coordination and gait  ECOG PERFORMANCE STATUS: 2 - Symptomatic, <50% confined to bed  Blood pressure 126/64, pulse 71, temperature 98.1 F (36.7 C), temperature source Oral, resp. rate 18, height $RemoveBe'5\' 3"'vMcaaLwiR$  (1.6 m), weight 116 lb 1.6 oz (52.663 kg).  LABORATORY DATA: Lab Results  Component Value Date   WBC 6.3 01/24/2014   HGB 12.1 01/24/2014   HCT 37.1 01/24/2014   MCV 108.2* 01/24/2014   PLT 226 01/24/2014      Chemistry      Component Value Date/Time   NA 139 01/24/2014 0858   NA 136* 06/12/2013 0503   K 4.2 01/24/2014 0858   K 3.4* 06/12/2013 0503   CL 98 06/12/2013 0503   CL 102 04/01/2012 0805   CO2 25 01/24/2014 0858   CO2 25 06/12/2013 0503   BUN 14.7 01/24/2014 0858    BUN 5* 06/12/2013 0503   CREATININE 0.8 01/24/2014 0858   CREATININE 0.79 06/12/2013 0503      Component Value Date/Time   CALCIUM 8.8 01/24/2014 0858   CALCIUM 8.9 06/12/2013 0503   ALKPHOS 83 01/24/2014 0858   ALKPHOS 94 06/09/2013 2230   AST 18 01/24/2014 0858   AST 23 06/09/2013 2230   ALT 10 01/24/2014 0858   ALT 17 06/09/2013 2230   BILITOT 0.38 01/24/2014 0858   BILITOT 0.9 06/09/2013 2230       RADIOGRAPHIC STUDIES: No results found.  ASSESSMENT AND PLAN: This is a very pleasant 81 years old white female with metastatic renal cell carcinoma.  She is currently on treatment with Votrient 800 mg by mouth daily status post 15 months of treatment and tolerating it fairly well.  I  recommended for the patient to resume her treatment with Votrient 1-2 days after her high surgery. I would see her back for follow-up visit in one month's for reevaluation with repeat blood work in addition to repeat CT scan of the chest, abdomen and pelvis without contrast for restaging of her disease. She was advised to call immediately if she has any concerning symptoms in the interval. The patient voices understanding of current disease status and treatment options and is in agreement with the current care plan.  All questions were answered. The patient knows to call the clinic with any problems, questions or concerns. We can certainly see the patient much sooner if necessary.  Disclaimer: This note was dictated with voice recognition software. Similar sounding words can inadvertently be transcribed and may not be corrected upon review.

## 2014-01-24 NOTE — Patient Instructions (Signed)

## 2014-01-24 NOTE — Telephone Encounter (Signed)
Pt confirmed labs/ov per 01/19 POF, gave pt AVS..... KJ, gave pt barium for CT

## 2014-01-26 DIAGNOSIS — H1851 Endothelial corneal dystrophy: Secondary | ICD-10-CM | POA: Diagnosis not present

## 2014-01-26 DIAGNOSIS — E039 Hypothyroidism, unspecified: Secondary | ICD-10-CM | POA: Diagnosis not present

## 2014-01-26 DIAGNOSIS — I10 Essential (primary) hypertension: Secondary | ICD-10-CM | POA: Diagnosis not present

## 2014-02-03 DIAGNOSIS — Z947 Corneal transplant status: Secondary | ICD-10-CM | POA: Diagnosis not present

## 2014-02-03 DIAGNOSIS — Z48298 Encounter for aftercare following other organ transplant: Secondary | ICD-10-CM | POA: Diagnosis not present

## 2014-02-20 ENCOUNTER — Ambulatory Visit (HOSPITAL_COMMUNITY)
Admission: RE | Admit: 2014-02-20 | Discharge: 2014-02-20 | Disposition: A | Discharge status: Home / Self Care | Payer: Medicare Other | Source: Ambulatory Visit | Attending: Internal Medicine | Admitting: Internal Medicine

## 2014-02-20 ENCOUNTER — Other Ambulatory Visit: Payer: Medicare Other

## 2014-02-20 ENCOUNTER — Other Ambulatory Visit (HOSPITAL_BASED_OUTPATIENT_CLINIC_OR_DEPARTMENT_OTHER): Payer: Medicare Other

## 2014-02-20 ENCOUNTER — Encounter (HOSPITAL_COMMUNITY): Payer: Self-pay

## 2014-02-20 DIAGNOSIS — Z85528 Personal history of other malignant neoplasm of kidney: Secondary | ICD-10-CM | POA: Diagnosis not present

## 2014-02-20 DIAGNOSIS — C8330 Diffuse large B-cell lymphoma, unspecified site: Secondary | ICD-10-CM | POA: Diagnosis not present

## 2014-02-20 DIAGNOSIS — C641 Malignant neoplasm of right kidney, except renal pelvis: Secondary | ICD-10-CM | POA: Insufficient documentation

## 2014-02-20 DIAGNOSIS — Z08 Encounter for follow-up examination after completed treatment for malignant neoplasm: Secondary | ICD-10-CM | POA: Insufficient documentation

## 2014-02-20 DIAGNOSIS — Z905 Acquired absence of kidney: Secondary | ICD-10-CM | POA: Insufficient documentation

## 2014-02-20 DIAGNOSIS — J984 Other disorders of lung: Secondary | ICD-10-CM | POA: Diagnosis not present

## 2014-02-20 DIAGNOSIS — Z79899 Other long term (current) drug therapy: Secondary | ICD-10-CM | POA: Insufficient documentation

## 2014-02-20 DIAGNOSIS — Z9889 Other specified postprocedural states: Secondary | ICD-10-CM | POA: Diagnosis not present

## 2014-02-20 LAB — COMPREHENSIVE METABOLIC PANEL (CC13)
ALT: 15 U/L (ref 0–55)
AST: 22 U/L (ref 5–34)
Albumin: 3.8 g/dL (ref 3.5–5.0)
Alkaline Phosphatase: 102 U/L (ref 40–150)
Anion Gap: 12 mEq/L — ABNORMAL HIGH (ref 3–11)
BILIRUBIN TOTAL: 0.57 mg/dL (ref 0.20–1.20)
BUN: 9.1 mg/dL (ref 7.0–26.0)
CO2: 33 mEq/L — ABNORMAL HIGH (ref 22–29)
CREATININE: 0.9 mg/dL (ref 0.6–1.1)
Calcium: 9.9 mg/dL (ref 8.4–10.4)
Chloride: 98 mEq/L (ref 98–109)
EGFR: 58 mL/min/{1.73_m2} — AB (ref >90)
GLUCOSE: 75 mg/dL (ref 70–140)
Potassium: 3.8 mEq/L (ref 3.5–5.1)
Sodium: 143 mEq/L (ref 136–145)
Total Protein: 6.4 g/dL (ref 6.4–8.3)

## 2014-02-20 LAB — CBC WITH DIFFERENTIAL/PLATELET
BASO%: 0.6 % (ref 0.0–2.0)
Basophils Absolute: 0 10*3/uL (ref 0.0–0.1)
EOS%: 4.7 % (ref 0.0–7.0)
Eosinophils Absolute: 0.2 10*3/uL (ref 0.0–0.5)
HEMATOCRIT: 42.4 % (ref 34.8–46.6)
HEMOGLOBIN: 13.8 g/dL (ref 11.6–15.9)
LYMPH%: 14.5 % (ref 14.0–49.7)
MCH: 34 pg (ref 25.1–34.0)
MCHC: 32.4 g/dL (ref 31.5–36.0)
MCV: 104.7 fL — ABNORMAL HIGH (ref 79.5–101.0)
MONO#: 0.4 10*3/uL (ref 0.1–0.9)
MONO%: 10 % (ref 0.0–14.0)
NEUT%: 70.2 % (ref 38.4–76.8)
NEUTROS ABS: 2.7 10*3/uL (ref 1.5–6.5)
Platelets: 278 10*3/uL (ref 145–400)
RBC: 4.06 10*6/uL (ref 3.70–5.45)
RDW: 15 % — ABNORMAL HIGH (ref 11.2–14.5)
WBC: 3.9 10*3/uL (ref 3.9–10.3)
lymph#: 0.6 10*3/uL — ABNORMAL LOW (ref 0.9–3.3)

## 2014-02-20 LAB — LACTATE DEHYDROGENASE (CC13): LDH: 202 U/L (ref 125–245)

## 2014-02-27 ENCOUNTER — Ambulatory Visit (HOSPITAL_BASED_OUTPATIENT_CLINIC_OR_DEPARTMENT_OTHER): Payer: Medicare Other | Admitting: Internal Medicine

## 2014-02-27 ENCOUNTER — Telehealth: Payer: Self-pay | Admitting: Internal Medicine

## 2014-02-27 ENCOUNTER — Encounter: Payer: Self-pay | Admitting: Internal Medicine

## 2014-02-27 VITALS — BP 141/73 | HR 58 | Temp 97.6°F | Resp 17 | Ht 63.0 in | Wt 113.2 lb

## 2014-02-27 DIAGNOSIS — C641 Malignant neoplasm of right kidney, except renal pelvis: Secondary | ICD-10-CM

## 2014-02-27 DIAGNOSIS — C7800 Secondary malignant neoplasm of unspecified lung: Secondary | ICD-10-CM

## 2014-02-27 DIAGNOSIS — C8330 Diffuse large B-cell lymphoma, unspecified site: Secondary | ICD-10-CM

## 2014-02-27 NOTE — Telephone Encounter (Signed)
Gave avs & calendar for March. °

## 2014-02-27 NOTE — Progress Notes (Signed)
[place] Telephone:(336) [phone]   Fax:([phone]  OFFICE PROGRESS NOTE  [name], MD [address] [place] [address]  DIAGNOSIS:  #1 non-Hodgkin's lymphoma diagnosed on bone marrow biopsy in June of 2012  #2 metastatic renal cell carcinoma initially diagnosed as stage III (T3a N0M0) renal cell carcinoma diagnosed in June of 2012 with pulmonary metastasis diagnosed in August of 2014.  PRIOR THERAPY:  1) Status post right nephrectomy under the care of Dr. Diona Fanti on 09/02/2010.  2) Systemic chemotherapy with CHOP/Rituxan given every 3 weeks, status post 6 cycles.   CURRENT THERAPY: Votrient 800 mg by mouth daily. Status post 16 months of treatment.  INTERVAL HISTORY: Melissa Cross 81 y.o. female returns to the clinic today for followup visit accompanied by her son. The patient is feeling fine today with no specific complaints. She is tolerating her treatment with Votrient fairly well with no significant adverse effect except for mild fatigue She denied having any significant weight loss or night sweats. The patient denied having any chest pain, shortness of breath, cough or hemoptysis. She has no nausea or vomiting. The patient denied having any fever or chills. She was recently treated for urinary tract infection with doxycycline by her urologist. It did improves her infection but unfortunately the patient has a lot of gastrointestinal adverse effects from doxycycline. She had repeat CT scan of the chest, abdomen and pelvis performed recently and she is here for evaluation and discussion of her scan results.   MEDICAL HISTORY: Past Medical History  Diagnosis Date  . Hypercholesteremia     takes Zocor daily  . Glaucoma   . Anemia   . Stroke     hx of TIA  . Blood transfusion   . Thrush 12/17/2010  . Hypertension     takes Nadolol daily  . Hypothyroidism     takes Synthroid daily  . Hypothyroid   . PONV (postoperative  nausea and vomiting)   . Shortness of breath     with exertion  . Headache(784.0)     hx of migraines  . TIA (transient ischemic attack)   . Peripheral neuropathy   . Arthritis     knee /hip  . Scoliosis   . History of bladder infections   . History of blood transfusion   . Cataract     right  . History of shingles   . Renal cell carcinoma   . Non Hodgkin's lymphoma   . Cancer     kidney cancer, NHL   . Cancer     last chemo 11/04/10     ALLERGIES:  is allergic to crab and dilaudid.  MEDICATIONS:  Current Outpatient Prescriptions  Medication Sig Dispense Refill  . prednisoLONE acetate (PRED FORTE) 1 % ophthalmic suspension 1 drop.    [name] amLODipine (NORVASC) 5 MG tablet Take 1 tablet (5 mg total) by mouth daily. 30 tablet 0  . aspirin EC 81 MG tablet Take 81 mg by mouth at bedtime.     . Calcium Citrate (CITRACAL PO) Take 1 tablet by mouth 3 (three) times a week.     . cholecalciferol (VITAMIN D) 400 UNITS TABS Take 400 Units by mouth daily.      [name] levothyroxine (SYNTHROID, LEVOTHROID) 50 MCG tablet Take 50 mcg by mouth every morning.     . Multiple Vitamin (MULITIVITAMIN WITH MINERALS) TABS Take 1 tablet by mouth daily.      . nadolol (  CORGARD) 40 MG tablet Take 40 mg by mouth at bedtime.     . pazopanib (VOTRIENT) 200 MG tablet Take 4 tablets (800 mg total) by mouth daily. Take on an empty stomach. (Patient not taking: Reported on 01/24/2014) 120 tablet 11  . vitamin B-12 (CYANOCOBALAMIN) 1000 MCG tablet Place 1,000 mcg under the tongue daily.      No current facility-administered medications for this visit.   Facility-Administered Medications Ordered in Other Visits  Medication Dose Route Frequency Provider Last Rate Last Dose  . sodium chloride 0.9 % injection 10 mL  10 mL Intravenous PRN Curt Bears, MD   10 mL at 05/17/12 0846    SURGICAL HISTORY:  Past Surgical History  Procedure Laterality Date  . Nephrectomy      Right Side  . Portacath placement       right subclavian  . Tonsillectomy      as a child  . Mouth surgery    . Left cataract removed    . Video bronchoscopy with endobronchial ultrasound N/A 08/06/2012    Procedure: VIDEO BRONCHOSCOPY WITH ENDOBRONCHIAL ULTRASOUND;  Surgeon: Collene Gobble, MD;  Location: [facility];  Service: Thoracic;  Laterality: N/A;  . Video bronchoscopy with endobronchial navigation N/A 08/06/2012    Procedure: VIDEO BRONCHOSCOPY WITH ENDOBRONCHIAL NAVIGATION;  Surgeon: Collene Gobble, MD;  Location: [place];  Service: Thoracic;  Laterality: N/A;    REVIEW OF SYSTEMS:  Constitutional: positive for fatigue Eyes: negative Ears, nose, mouth, throat, and face: negative Respiratory: negative Cardiovascular: negative Gastrointestinal: negative Genitourinary:negative Integument/breast: negative Hematologic/lymphatic: negative Musculoskeletal:negative Neurological: negative Behavioral/Psych: negative Endocrine: negative Allergic/Immunologic: negative   PHYSICAL EXAMINATION: General appearance: alert, cooperative, fatigued and no distress Head: Normocephalic, without obvious abnormality, atraumatic Neck: no adenopathy Lymph nodes: Cervical, supraclavicular, and axillary nodes normal. Resp: clear to auscultation bilaterally Cardio: regular rate and rhythm, S1, S2 normal, no murmur, click, rub or gallop GI: soft, non-tender; bowel sounds normal; no masses,  no organomegaly Extremities: extremities normal, atraumatic, no cyanosis or edema Neurologic: Alert and oriented X 3, normal strength and tone. Normal symmetric reflexes. Normal coordination and gait  ECOG PERFORMANCE STATUS: 2 - Symptomatic, <50% confined to bed  Blood pressure 141/73, pulse 58, temperature 97.6 F (36.4 C), temperature source Oral, resp. rate 17, height _0  (1.6 m), weight 113 lb 3.2 oz (51.347 kg), SpO2 98 %.  LABORATORY DATA: Lab Results  Component Value Date   WBC 3.9 02/20/2014   HGB 13.8 02/20/2014   HCT 42.4 02/20/2014   MCV  104.7* 02/20/2014   PLT 278 02/20/2014      Chemistry      Component Value Date/Time   NA 143 02/20/2014 0920   NA 136* 06/12/2013 0503   K 3.8 02/20/2014 0920   K 3.4* 06/12/2013 0503   CL 98 06/12/2013 0503   CL 102 04/01/2012 0805   CO2 33* 02/20/2014 0920   CO2 25 06/12/2013 0503   BUN 9.1 02/20/2014 0920   BUN 5* 06/12/2013 0503   CREATININE 0.9 02/20/2014 0920   CREATININE 0.79 06/12/2013 0503      Component Value Date/Time   CALCIUM 9.9 02/20/2014 0920   CALCIUM 8.9 06/12/2013 0503   ALKPHOS 102 02/20/2014 0920   ALKPHOS 94 06/09/2013 2230   AST 22 02/20/2014 0920   AST 23 06/09/2013 2230   ALT 15 02/20/2014 0920   ALT 17 06/09/2013 2230   BILITOT 0.57 02/20/2014 0920   BILITOT 0.9 06/09/2013 2230  RADIOGRAPHIC STUDIES: Ct Abdomen Pelvis Wo Contrast  02/20/2014   CLINICAL DATA:  Lymphoma diagnosed 2012. Chemotherapy ongoing. History of renal cell carcinoma.  EXAM: CT CHEST, ABDOMEN, AND PELVIS WITHOUT  CONTRAST  TECHNIQUE: Multidetector CT imaging of the chest, abdomen and pelvis was performed following the standard protocol.  CONTRAST:  No contrast  COMPARISON:  CT 11/16/2013.  FINDINGS: CT CHEST FINDINGS  Mediastinum/Nodes: No axillary or supraclavicular lymphadenopathy. No mediastinal or hilar lymphadenopathy. No pericardial fluid. Ascending aorta measures 40 mm.  Lungs/Pleura: Mild scarring in the right middle lobe. The pulmonary nodules. The pleural thickening.  Musculoskeletal: No chest wall abnormality. Port in the right chest wall.  CT ABDOMEN AND PELVIS FINDINGS  Hepatobiliary: Non IV contrast images demonstrate a cyst in the right hepatic lobe not changed prior.  Pancreas: Pancreas is normal. No ductal dilatation. No pancreatic inflammation.  Spleen: Normal spleen.  Adrenals/Urinary Tract: Adrenal glands are normal. Patient status post right nephrectomy. Kidney is normal.  Stomach/Bowel: Stomach, small bowel, and appendix are normal. The colon demonstrates a  moderate volume of stool throughout its course. Rectum is normal. No obstructing lesion.  Vascular/Lymphatic: Abdominal aorta is normal caliber with atherosclerotic calcification. There is no retroperitoneal or periportal lymphadenopathy. No pelvic lymphadenopathy. No mesenteric adenopathy.  Reproductive: High-density focus within the endometrium measuring 10 mm is not changed from prior. There there are cyst associated with the left ovary which are unchanged to decreased compared to 11/09/2012  Other: No free fluid in the abdomen or pelvis. No peritoneal disease.  Musculoskeletal: No aggressive osseous lesion. Marked degenerative endplate change in the lumbar spine.  IMPRESSION: Chest Impression:  No evidence of lymphoma recurrence in thorax.  Abdomen / Pelvis Impression:  1. No evidence of lymphoma recurrence within the abdomen or pelvis. 2. Normal spleen. 3. Stable right nephrectomy bed.   Electronically Signed   By: Suzy Bouchard M.D.   On: 02/20/2014 12:55   Ct Chest Wo Contrast  02/20/2014   CLINICAL DATA:  Lymphoma diagnosed 2012. Chemotherapy ongoing. History of renal cell carcinoma.  EXAM: CT CHEST, ABDOMEN, AND PELVIS WITHOUT  CONTRAST  TECHNIQUE: Multidetector CT imaging of the chest, abdomen and pelvis was performed following the standard protocol.  CONTRAST:  No contrast  COMPARISON:  CT 11/16/2013.  FINDINGS: CT CHEST FINDINGS  Mediastinum/Nodes: No axillary or supraclavicular lymphadenopathy. No mediastinal or hilar lymphadenopathy. No pericardial fluid. Ascending aorta measures 40 mm.  Lungs/Pleura: Mild scarring in the right middle lobe. The pulmonary nodules. The pleural thickening.  Musculoskeletal: No chest wall abnormality. Port in the right chest wall.  CT ABDOMEN AND PELVIS FINDINGS  Hepatobiliary: Non IV contrast images demonstrate a cyst in the right hepatic lobe not changed prior.  Pancreas: Pancreas is normal. No ductal dilatation. No pancreatic inflammation.  Spleen: Normal spleen.   Adrenals/Urinary Tract: Adrenal glands are normal. Patient status post right nephrectomy. Kidney is normal.  Stomach/Bowel: Stomach, small bowel, and appendix are normal. The colon demonstrates a moderate volume of stool throughout its course. Rectum is normal. No obstructing lesion.  Vascular/Lymphatic: Abdominal aorta is normal caliber with atherosclerotic calcification. There is no retroperitoneal or periportal lymphadenopathy. No pelvic lymphadenopathy. No mesenteric adenopathy.  Reproductive: High-density focus within the endometrium measuring 10 mm is not changed from prior. There there are cyst associated with the left ovary which are unchanged to decreased compared to 11/09/2012  Other: No free fluid in the abdomen or pelvis. No peritoneal disease.  Musculoskeletal: No aggressive osseous lesion. Marked degenerative endplate change in the  lumbar spine.  IMPRESSION: Chest Impression:  No evidence of lymphoma recurrence in thorax.  Abdomen / Pelvis Impression:  1. No evidence of lymphoma recurrence within the abdomen or pelvis. 2. Normal spleen. 3. Stable right nephrectomy bed.   Electronically Signed   By: Suzy Bouchard M.D.   On: 02/20/2014 12:55    ASSESSMENT AND PLAN: This is a very pleasant 81 years old white female with metastatic renal cell carcinoma.  She is currently on treatment with Votrient 800 mg by mouth daily status post 16 months of treatment and tolerating it fairly well.  The recent CT scan of the chest, abdomen and pelvis showed no evidence for disease progression. I discussed the scan results with the patient and her son. I recommended for her to continue her current treatment with Votrient 800 mg by mouth daily. She will come back for follow-up visit in one month for reevaluation. She was advised to call immediately if she has any concerning symptoms in the interval. The patient voices understanding of current disease status and treatment options and is in agreement with the  current care plan.  All questions were answered. The patient knows to call the clinic with any problems, questions or concerns. We can certainly see the patient much sooner if necessary.  Disclaimer: This note was dictated with voice recognition software. Similar sounding words can inadvertently be transcribed and may not be corrected upon review.

## 2014-03-27 ENCOUNTER — Telehealth: Payer: Self-pay | Admitting: Physician Assistant

## 2014-03-27 ENCOUNTER — Encounter: Payer: Self-pay | Admitting: Physician Assistant

## 2014-03-27 ENCOUNTER — Other Ambulatory Visit (HOSPITAL_BASED_OUTPATIENT_CLINIC_OR_DEPARTMENT_OTHER): Payer: Medicare Other

## 2014-03-27 ENCOUNTER — Ambulatory Visit (HOSPITAL_BASED_OUTPATIENT_CLINIC_OR_DEPARTMENT_OTHER): Payer: Medicare Other | Admitting: Physician Assistant

## 2014-03-27 ENCOUNTER — Other Ambulatory Visit: Payer: Medicare Other

## 2014-03-27 ENCOUNTER — Ambulatory Visit (HOSPITAL_BASED_OUTPATIENT_CLINIC_OR_DEPARTMENT_OTHER): Payer: Medicare Other

## 2014-03-27 VITALS — BP 172/84 | HR 61 | Temp 97.5°F | Resp 18 | Ht 63.0 in | Wt 113.4 lb

## 2014-03-27 DIAGNOSIS — C641 Malignant neoplasm of right kidney, except renal pelvis: Secondary | ICD-10-CM

## 2014-03-27 DIAGNOSIS — C8330 Diffuse large B-cell lymphoma, unspecified site: Secondary | ICD-10-CM

## 2014-03-27 DIAGNOSIS — C7800 Secondary malignant neoplasm of unspecified lung: Secondary | ICD-10-CM

## 2014-03-27 DIAGNOSIS — Z95828 Presence of other vascular implants and grafts: Secondary | ICD-10-CM

## 2014-03-27 LAB — COMPREHENSIVE METABOLIC PANEL (CC13)
ALK PHOS: 87 U/L (ref 40–150)
ALT: 16 U/L (ref 0–55)
AST: 18 U/L (ref 5–34)
Albumin: 3.4 g/dL — ABNORMAL LOW (ref 3.5–5.0)
Anion Gap: 11 mEq/L (ref 3–11)
BUN: 11.5 mg/dL (ref 7.0–26.0)
CO2: 27 mEq/L (ref 22–29)
CREATININE: 0.8 mg/dL (ref 0.6–1.1)
Calcium: 9 mg/dL (ref 8.4–10.4)
Chloride: 103 mEq/L (ref 98–109)
EGFR: 70 mL/min/{1.73_m2} — ABNORMAL LOW (ref >90)
Glucose: 92 mg/dl (ref 70–140)
Potassium: 3.4 mEq/L — ABNORMAL LOW (ref 3.5–5.1)
Sodium: 140 mEq/L (ref 136–145)
Total Bilirubin: 0.55 mg/dL (ref 0.20–1.20)
Total Protein: 5.7 g/dL — ABNORMAL LOW (ref 6.4–8.3)

## 2014-03-27 LAB — CBC WITH DIFFERENTIAL/PLATELET
BASO%: 0.5 % (ref 0.0–2.0)
BASOS ABS: 0 10*3/uL (ref 0.0–0.1)
EOS ABS: 0.1 10*3/uL (ref 0.0–0.5)
EOS%: 2.5 % (ref 0.0–7.0)
HCT: 39.9 % (ref 34.8–46.6)
HEMOGLOBIN: 13.1 g/dL (ref 11.6–15.9)
LYMPH%: 15 % (ref 14.0–49.7)
MCH: 34.5 pg — ABNORMAL HIGH (ref 25.1–34.0)
MCHC: 32.9 g/dL (ref 31.5–36.0)
MCV: 104.8 fL — AB (ref 79.5–101.0)
MONO#: 0.4 10*3/uL (ref 0.1–0.9)
MONO%: 9.5 % (ref 0.0–14.0)
NEUT%: 72.5 % (ref 38.4–76.8)
NEUTROS ABS: 2.7 10*3/uL (ref 1.5–6.5)
Platelets: 214 10*3/uL (ref 145–400)
RBC: 3.8 10*6/uL (ref 3.70–5.45)
RDW: 16.1 % — ABNORMAL HIGH (ref 11.2–14.5)
WBC: 3.7 10*3/uL — AB (ref 3.9–10.3)
lymph#: 0.6 10*3/uL — ABNORMAL LOW (ref 0.9–3.3)

## 2014-03-27 LAB — LACTATE DEHYDROGENASE (CC13): LDH: 201 U/L (ref 125–245)

## 2014-03-27 MED ORDER — SODIUM CHLORIDE 0.9 % IJ SOLN
10.0000 mL | INTRAMUSCULAR | Status: DC | PRN
  Administered 2014-03-27: 10 mL via INTRAVENOUS
  Filled 2014-03-27: qty 10

## 2014-03-27 MED ORDER — HEPARIN SOD (PORK) LOCK FLUSH 100 UNIT/ML IV SOLN
500.0000 [IU] | Freq: Once | INTRAVENOUS | Status: AC
Start: 2014-03-27 — End: 2014-03-27
  Administered 2014-03-27: 500 [IU] via INTRAVENOUS
  Filled 2014-03-27: qty 5

## 2014-03-27 NOTE — Progress Notes (Addendum)
[place] Telephone:(336) [phone]   Fax:(336) [phone]  OFFICE PROGRESS NOTE  [name], MD [address] [place] [address]  DIAGNOSIS:  #1 non-Hodgkin's lymphoma diagnosed on bone marrow biopsy in June of 2012  #2 metastatic renal cell carcinoma initially diagnosed as stage III (T3a N0M0) renal cell carcinoma diagnosed in June of 2012 with pulmonary metastasis diagnosed in August of 2014.  PRIOR THERAPY:  1) Status post right nephrectomy under the care of Dr. Diona Fanti on 09/02/2010.  2) Systemic chemotherapy with CHOP/Rituxan given every 3 weeks, status post 6 cycles.   CURRENT THERAPY: Votrient 800 mg by mouth daily. Status post 17 months of treatment.  INTERVAL HISTORY: Melissa Cross 81 y.o. female returns to the clinic today for followup visit accompanied by her son. The patient is feeling fine today. She is tolerating her treatment with Votrient fairly well with no significant adverse effect except for mild fatigue and loss of taste. She denied having any significant weight loss or night sweats. The patient denied having any chest pain, shortness of breath, cough or hemoptysis. She has no nausea or vomiting. The patient denied having any fever or chills. She is status post right cornea transplant approximately 2 months ago. There are plans to have the left eye cornea transplant as well as cataract surgery in another couple of months. She is had a recent fall in her home on the carpet. She hit her head but did not lose consciousness and did not sustain any injuries.  MEDICAL HISTORY: Past Medical History  Diagnosis Date  . Hypercholesteremia     takes Zocor daily  . Glaucoma   . Anemia   . Stroke     hx of TIA  . Blood transfusion   . Thrush 12/17/2010  . Hypertension     takes Nadolol daily  . Hypothyroidism     takes Synthroid daily  . Hypothyroid   . PONV (postoperative nausea and vomiting)   . Shortness of breath       with exertion  . Headache(784.0)     hx of migraines  . TIA (transient ischemic attack)   . Peripheral neuropathy   . Arthritis     knee /hip  . Scoliosis   . History of bladder infections   . History of blood transfusion   . Cataract     right  . History of shingles   . Renal cell carcinoma   . Non Hodgkin's lymphoma   . Cancer     kidney cancer, NHL   . Cancer     last chemo 11/04/10     ALLERGIES:  is allergic to crab and dilaudid.  MEDICATIONS:  Current Outpatient Prescriptions  Medication Sig Dispense Refill  . amLODipine (NORVASC) 5 MG tablet Take 1 tablet (5 mg total) by mouth daily. 30 tablet 0  . aspirin EC 81 MG tablet Take 81 mg by mouth at bedtime.     . Calcium Citrate (CITRACAL PO) Take 1 tablet by mouth 3 (three) times a week.     . cholecalciferol (VITAMIN D) 400 UNITS TABS Take 400 Units by mouth daily.      [name] levothyroxine (SYNTHROID, LEVOTHROID) 50 MCG tablet Take 50 mcg by mouth every morning.     . Multiple Vitamin (MULITIVITAMIN WITH MINERALS) TABS Take 1 tablet by mouth daily.      . nadolol (CORGARD) 40 MG tablet Take 40 mg by mouth at bedtime.     [name]  pazopanib (VOTRIENT) 200 MG tablet Take 4 tablets (800 mg total) by mouth daily. Take on an empty stomach. 120 tablet 11  . prednisoLONE acetate (PRED FORTE) 1 % ophthalmic suspension 1 drop.    . vitamin B-12 (CYANOCOBALAMIN) 1000 MCG tablet Place 1,000 mcg under the tongue daily.      No current facility-administered medications for this visit.   Facility-Administered Medications Ordered in Other Visits  Medication Dose Route Frequency Provider Last Rate Last Dose  . sodium chloride 0.9 % injection 10 mL  10 mL Intravenous PRN Curt Bears, MD   10 mL at 05/17/12 0846    SURGICAL HISTORY:  Past Surgical History  Procedure Laterality Date  . Nephrectomy      Right Side  . Portacath placement      right subclavian  . Tonsillectomy      as a child  . Mouth surgery    . Left cataract  removed    . Video bronchoscopy with endobronchial ultrasound N/A 08/06/2012    Procedure: VIDEO BRONCHOSCOPY WITH ENDOBRONCHIAL ULTRASOUND;  Surgeon: Collene Gobble, MD;  Location: [facility];  Service: Thoracic;  Laterality: N/A;  . Video bronchoscopy with endobronchial navigation N/A 08/06/2012    Procedure: VIDEO BRONCHOSCOPY WITH ENDOBRONCHIAL NAVIGATION;  Surgeon: Collene Gobble, MD;  Location: [facility];  Service: Thoracic;  Laterality: N/A;    REVIEW OF SYSTEMS:  Constitutional: positive for fatigue Eyes: negative Ears, nose, mouth, throat, and face: negative Respiratory: negative Cardiovascular: negative Gastrointestinal: negative Genitourinary:negative Integument/breast: negative Hematologic/lymphatic: negative Musculoskeletal:negative Neurological: negative Behavioral/Psych: negative Endocrine: negative Allergic/Immunologic: negative   PHYSICAL EXAMINATION: General appearance: alert, cooperative, fatigued and no distress Head: Normocephalic, without obvious abnormality, atraumatic Neck: no adenopathy Lymph nodes: Cervical, supraclavicular, and axillary nodes normal. Resp: clear to auscultation bilaterally Cardio: regular rate and rhythm, S1, S2 normal, no murmur, click, rub or gallop GI: soft, non-tender; bowel sounds normal; no masses,  no organomegaly Extremities: extremities normal, atraumatic, no cyanosis or edema Neurologic: Alert and oriented X 3, normal strength and tone. Normal symmetric reflexes. Normal coordination and gait  ECOG PERFORMANCE STATUS: 2 - Symptomatic, <50% confined to bed  Blood pressure 172/84, pulse 61, temperature 97.5 F (36.4 C), resp. rate 18, height $RemoveBe'5\' 3"'ONpewUpWO$  (1.6 m), weight 113 lb 6.4 oz (51.438 kg), SpO2 99 %.  LABORATORY DATA: Lab Results  Component Value Date   WBC 3.7* 03/27/2014   HGB 13.1 03/27/2014   HCT 39.9 03/27/2014   MCV 104.8* 03/27/2014   PLT 214 03/27/2014      Chemistry      Component Value Date/Time   NA 140 03/27/2014 0904    NA 136* 06/12/2013 0503   K 3.4* 03/27/2014 0904   K 3.4* 06/12/2013 0503   CL 98 06/12/2013 0503   CL 102 04/01/2012 0805   CO2 27 03/27/2014 0904   CO2 25 06/12/2013 0503   BUN 11.5 03/27/2014 0904   BUN 5* 06/12/2013 0503   CREATININE 0.8 03/27/2014 0904   CREATININE 0.79 06/12/2013 0503      Component Value Date/Time   CALCIUM 9.0 03/27/2014 0904   CALCIUM 8.9 06/12/2013 0503   ALKPHOS 87 03/27/2014 0904   ALKPHOS 94 06/09/2013 2230   AST 18 03/27/2014 0904   AST 23 06/09/2013 2230   ALT 16 03/27/2014 0904   ALT 17 06/09/2013 2230   BILITOT 0.55 03/27/2014 0904   BILITOT 0.9 06/09/2013 2230       RADIOGRAPHIC STUDIES: No results found.  ASSESSMENT AND PLAN:  This is a very pleasant 81 years old white female with metastatic renal cell carcinoma.  She is currently on treatment with Votrient 800 mg by mouth daily status post 17 months of treatment and tolerating it fairly well.  The recent CT scan of the chest, abdomen and pelvis showed no evidence for disease progression. She will continue her current treatment with Votrient 800 mg by mouth daily. She will come back for follow-up visit in one month for reevaluation. She was advised to call immediately if she has any concerning symptoms in the interval. The patient voices understanding of current disease status and treatment options and is in agreement with the current care plan.  All questions were answered. The patient knows to call the clinic with any problems, questions or concerns. We can certainly see the patient much sooner if necessary.  Carlton Adam, PA-C 03/27/2014  ADDENDUM: Hematology/Oncology Attending: I had a face to face encounter with the patient. I recommended her care plan. This is a very pleasant 81 years old white female with history of static renal cell carcinoma currently on treatment with Votrient 800 mg by mouth daily status post 17 months of treatment and tolerating her treatment fairly  well with no significant adverse effects except for mild fatigue. The patient also has a history of non-Hodgkin lymphoma that is currently on observation. I recommended for her to continue her treatment with Votrient as a scheduled. She will come back for follow-up visit in one month's for reevaluation and repeat blood work. She was advised to call immediately if she has any concerning symptoms in the interval.  Disclaimer: This note was dictated with voice recognition software. Similar sounding words can inadvertently be transcribed and may be missed upon review. Eilleen Kempf., MD 03/27/2014

## 2014-03-27 NOTE — Telephone Encounter (Signed)
Gave avs & calendar for April. °

## 2014-03-27 NOTE — Patient Instructions (Signed)
Continue Votrient 800 mg by mouth daily Follow-up in one month

## 2014-03-27 NOTE — Patient Instructions (Signed)

## 2014-04-25 ENCOUNTER — Ambulatory Visit (HOSPITAL_BASED_OUTPATIENT_CLINIC_OR_DEPARTMENT_OTHER): Payer: Medicare Other | Admitting: Internal Medicine

## 2014-04-25 ENCOUNTER — Telehealth: Payer: Self-pay | Admitting: Internal Medicine

## 2014-04-25 ENCOUNTER — Encounter: Payer: Self-pay | Admitting: Internal Medicine

## 2014-04-25 ENCOUNTER — Other Ambulatory Visit (HOSPITAL_BASED_OUTPATIENT_CLINIC_OR_DEPARTMENT_OTHER): Payer: Medicare Other

## 2014-04-25 VITALS — BP 157/71 | HR 61 | Temp 98.4°F | Resp 18 | Ht 63.0 in | Wt 112.8 lb

## 2014-04-25 DIAGNOSIS — C641 Malignant neoplasm of right kidney, except renal pelvis: Secondary | ICD-10-CM

## 2014-04-25 DIAGNOSIS — C7800 Secondary malignant neoplasm of unspecified lung: Secondary | ICD-10-CM | POA: Diagnosis not present

## 2014-04-25 DIAGNOSIS — C8330 Diffuse large B-cell lymphoma, unspecified site: Secondary | ICD-10-CM

## 2014-04-25 LAB — CBC WITH DIFFERENTIAL/PLATELET
BASO%: 0.5 % (ref 0.0–2.0)
Basophils Absolute: 0 10*3/uL (ref 0.0–0.1)
EOS ABS: 0.1 10*3/uL (ref 0.0–0.5)
EOS%: 2.1 % (ref 0.0–7.0)
HCT: 39.2 % (ref 34.8–46.6)
HGB: 13 g/dL (ref 11.6–15.9)
LYMPH%: 24.2 % (ref 14.0–49.7)
MCH: 34.9 pg — AB (ref 25.1–34.0)
MCHC: 33.2 g/dL (ref 31.5–36.0)
MCV: 105.4 fL — AB (ref 79.5–101.0)
MONO#: 0.3 10*3/uL (ref 0.1–0.9)
MONO%: 7 % (ref 0.0–14.0)
NEUT#: 2.6 10*3/uL (ref 1.5–6.5)
NEUT%: 66.2 % (ref 38.4–76.8)
PLATELETS: 194 10*3/uL (ref 145–400)
RBC: 3.72 10*6/uL (ref 3.70–5.45)
RDW: 15.9 % — AB (ref 11.2–14.5)
WBC: 3.9 10*3/uL (ref 3.9–10.3)
lymph#: 0.9 10*3/uL (ref 0.9–3.3)

## 2014-04-25 LAB — COMPREHENSIVE METABOLIC PANEL (CC13)
ALT: 13 U/L (ref 0–55)
ANION GAP: 13 meq/L — AB (ref 3–11)
AST: 20 U/L (ref 5–34)
Albumin: 3.5 g/dL (ref 3.5–5.0)
Alkaline Phosphatase: 110 U/L (ref 40–150)
BILIRUBIN TOTAL: 0.46 mg/dL (ref 0.20–1.20)
BUN: 11.8 mg/dL (ref 7.0–26.0)
CO2: 28 mEq/L (ref 22–29)
CREATININE: 0.8 mg/dL (ref 0.6–1.1)
Calcium: 9.3 mg/dL (ref 8.4–10.4)
Chloride: 101 mEq/L (ref 98–109)
EGFR: 69 mL/min/{1.73_m2} — ABNORMAL LOW (ref >90)
Glucose: 88 mg/dl (ref 70–140)
Potassium: 4.1 mEq/L (ref 3.5–5.1)
Sodium: 142 mEq/L (ref 136–145)
Total Protein: 6.1 g/dL — ABNORMAL LOW (ref 6.4–8.3)

## 2014-04-25 LAB — LACTATE DEHYDROGENASE (CC13): LDH: 217 U/L (ref 125–245)

## 2014-04-25 NOTE — Progress Notes (Signed)
[facility] [phone]   Fax:([phone]  OFFICE PROGRESS NOTE  [name], MD [address] [place] [address]  DIAGNOSIS:  #1 non-Hodgkin's lymphoma diagnosed on bone marrow biopsy in June of 2012  #2 metastatic renal cell carcinoma initially diagnosed as stage III (T3a N0M0) renal cell carcinoma diagnosed in June of 2012 with pulmonary metastasis diagnosed in August of 2014.  PRIOR THERAPY:  1) Status post right nephrectomy under the care of Dr. Diona Fanti on 09/02/2010.  2) Systemic chemotherapy with CHOP/Rituxan given every 3 weeks, status post 6 cycles.   CURRENT THERAPY: Votrient 800 mg by mouth daily. Status post 18 months of treatment.  INTERVAL HISTORY: Melissa Cross 81 y.o. female returns to the clinic today for followup visit accompanied by her son. The patient is feeling fine today with no specific complaints. She is tolerating her treatment with Votrient fairly well with no significant adverse effect except for mild fatigue She denied having any significant weight loss or night sweats. The patient denied having any chest pain, shortness of breath, cough or hemoptysis. She has no nausea or vomiting. The patient denied having any fever or chills.   MEDICAL HISTORY: Past Medical History  Diagnosis Date  . Hypercholesteremia     takes Zocor daily  . Glaucoma   . Anemia   . Stroke     hx of TIA  . Blood transfusion   . Thrush 12/17/2010  . Hypertension     takes Nadolol daily  . Hypothyroidism     takes Synthroid daily  . Hypothyroid   . PONV (postoperative nausea and vomiting)   . Shortness of breath     with exertion  . Headache(784.0)     hx of migraines  . TIA (transient ischemic attack)   . Peripheral neuropathy   . Arthritis     knee /hip  . Scoliosis   . History of bladder infections   . History of blood transfusion   . Cataract     right  . History of shingles   . Renal cell  carcinoma   . Non Hodgkin's lymphoma   . Cancer     kidney cancer, NHL   . Cancer     last chemo 11/04/10     ALLERGIES:  is allergic to crab and dilaudid.  MEDICATIONS:  Current Outpatient Prescriptions  Medication Sig Dispense Refill  . amLODipine (NORVASC) 5 MG tablet Take 1 tablet (5 mg total) by mouth daily. 30 tablet 0  . aspirin EC 81 MG tablet Take 81 mg by mouth at bedtime.     . Calcium Citrate (CITRACAL PO) Take 1 tablet by mouth 3 (three) times a week.     . cholecalciferol (VITAMIN D) 400 UNITS TABS Take 400 Units by mouth daily.      [name] levothyroxine (SYNTHROID, LEVOTHROID) 50 MCG tablet Take 50 mcg by mouth every morning.     . Multiple Vitamin (MULITIVITAMIN WITH MINERALS) TABS Take 1 tablet by mouth daily.      . nadolol (CORGARD) 40 MG tablet Take 40 mg by mouth at bedtime.     . pazopanib (VOTRIENT) 200 MG tablet Take 4 tablets (800 mg total) by mouth daily. Take on an empty stomach. 120 tablet 11  . prednisoLONE acetate (PRED FORTE) 1 % ophthalmic suspension 1 drop.    . vitamin B-12 (CYANOCOBALAMIN) 1000 MCG tablet Place 1,000 mcg under the tongue daily.  No current facility-administered medications for this visit.   Facility-Administered Medications Ordered in Other Visits  Medication Dose Route Frequency Provider Last Rate Last Dose  . sodium chloride 0.9 % injection 10 mL  10 mL Intravenous PRN Curt Bears, MD   10 mL at 05/17/12 0846    SURGICAL HISTORY:  Past Surgical History  Procedure Laterality Date  . Nephrectomy      Right Side  . Portacath placement      right subclavian  . Tonsillectomy      as a child  . Mouth surgery    . Left cataract removed    . Video bronchoscopy with endobronchial ultrasound N/A 08/06/2012    Procedure: VIDEO BRONCHOSCOPY WITH ENDOBRONCHIAL ULTRASOUND;  Surgeon: Collene Gobble, MD;  Location: [facility];  Service: Thoracic;  Laterality: N/A;  . Video bronchoscopy with endobronchial navigation N/A 08/06/2012     Procedure: VIDEO BRONCHOSCOPY WITH ENDOBRONCHIAL NAVIGATION;  Surgeon: Collene Gobble, MD;  Location: [facility];  Service: Thoracic;  Laterality: N/A;    REVIEW OF SYSTEMS:  Constitutional: positive for fatigue Eyes: negative Ears, nose, mouth, throat, and face: negative Respiratory: negative Cardiovascular: negative Gastrointestinal: negative Genitourinary:negative Integument/breast: negative Hematologic/lymphatic: negative Musculoskeletal:negative Neurological: negative Behavioral/Psych: negative Endocrine: negative Allergic/Immunologic: negative   PHYSICAL EXAMINATION: General appearance: alert, cooperative, fatigued and no distress Head: Normocephalic, without obvious abnormality, atraumatic Neck: no adenopathy Lymph nodes: Cervical, supraclavicular, and axillary nodes normal. Resp: clear to auscultation bilaterally Cardio: regular rate and rhythm, S1, S2 normal, no murmur, click, rub or gallop GI: soft, non-tender; bowel sounds normal; no masses,  no organomegaly Extremities: extremities normal, atraumatic, no cyanosis or edema Neurologic: Alert and oriented X 3, normal strength and tone. Normal symmetric reflexes. Normal coordination and gait  ECOG PERFORMANCE STATUS: 2 - Symptomatic, <50% confined to bed  Blood pressure 157/71, pulse 61, temperature 98.4 F (36.9 C), temperature source Oral, resp. rate 18, height $RemoveBe'5\' 3"'[id number]$  (1.6 m), weight 112 lb 12.8 oz (51.166 kg), SpO2 92 %.  LABORATORY DATA: Lab Results  Component Value Date   WBC 3.9 04/25/2014   HGB 13.0 04/25/2014   HCT 39.2 04/25/2014   MCV 105.4* 04/25/2014   PLT 194 04/25/2014      Chemistry      Component Value Date/Time   NA 142 04/25/2014 0907   NA 136* 06/12/2013 0503   K 4.1 04/25/2014 0907   K 3.4* 06/12/2013 0503   CL 98 06/12/2013 0503   CL 102 04/01/2012 0805   CO2 28 04/25/2014 0907   CO2 25 06/12/2013 0503   BUN 11.8 04/25/2014 0907   BUN 5* 06/12/2013 0503   CREATININE 0.8 04/25/2014 0907    CREATININE 0.79 06/12/2013 0503      Component Value Date/Time   CALCIUM 9.3 04/25/2014 0907   CALCIUM 8.9 06/12/2013 0503   ALKPHOS 110 04/25/2014 0907   ALKPHOS 94 06/09/2013 2230   AST 20 04/25/2014 0907   AST 23 06/09/2013 2230   ALT 13 04/25/2014 0907   ALT 17 06/09/2013 2230   BILITOT 0.46 04/25/2014 0907   BILITOT 0.9 06/09/2013 2230       RADIOGRAPHIC STUDIES:  ASSESSMENT AND PLAN: This is a very pleasant 81 years old white female with metastatic renal cell carcinoma.  She is currently on treatment with Votrient 800 mg by mouth daily status post 18 months of treatment and tolerating it fairly well.  Her blood work today is unremarkable. I recommended for her to continue her current treatment with  Votrient 800 mg by mouth daily. She will come back for follow-up visit in one month for reevaluation after repeating CT scan of the chest, abdomen and pelvis without contrast. She was advised to call immediately if she has any concerning symptoms in the interval. The patient voices understanding of current disease status and treatment options and is in agreement with the current care plan.  All questions were answered. The patient knows to call the clinic with any problems, questions or concerns. We can certainly see the patient much sooner if necessary.  Disclaimer: This note was dictated with voice recognition software. Similar sounding words can inadvertently be transcribed and may not be corrected upon review.

## 2014-04-25 NOTE — Telephone Encounter (Signed)
returned call and s.w pt and r/s lab/flush....pt ok and aware

## 2014-04-25 NOTE — Telephone Encounter (Signed)
Gave avs & calendar for May. Gave CT contrast for CT scan.

## 2014-05-08 DIAGNOSIS — Z01818 Encounter for other preprocedural examination: Secondary | ICD-10-CM | POA: Diagnosis not present

## 2014-05-08 DIAGNOSIS — H2511 Age-related nuclear cataract, right eye: Secondary | ICD-10-CM | POA: Diagnosis not present

## 2014-05-08 DIAGNOSIS — H269 Unspecified cataract: Secondary | ICD-10-CM | POA: Diagnosis not present

## 2014-05-08 DIAGNOSIS — Z859 Personal history of malignant neoplasm, unspecified: Secondary | ICD-10-CM | POA: Diagnosis not present

## 2014-05-08 DIAGNOSIS — Z8673 Personal history of transient ischemic attack (TIA), and cerebral infarction without residual deficits: Secondary | ICD-10-CM | POA: Diagnosis not present

## 2014-05-08 DIAGNOSIS — H26492 Other secondary cataract, left eye: Secondary | ICD-10-CM | POA: Diagnosis not present

## 2014-05-08 DIAGNOSIS — I10 Essential (primary) hypertension: Secondary | ICD-10-CM | POA: Diagnosis not present

## 2014-05-08 DIAGNOSIS — E039 Hypothyroidism, unspecified: Secondary | ICD-10-CM | POA: Diagnosis not present

## 2014-05-08 DIAGNOSIS — H35352 Cystoid macular degeneration, left eye: Secondary | ICD-10-CM | POA: Diagnosis not present

## 2014-05-18 DIAGNOSIS — R600 Localized edema: Secondary | ICD-10-CM | POA: Diagnosis not present

## 2014-05-18 DIAGNOSIS — H3530 Unspecified macular degeneration: Secondary | ICD-10-CM | POA: Diagnosis not present

## 2014-05-18 DIAGNOSIS — Z5111 Encounter for antineoplastic chemotherapy: Secondary | ICD-10-CM | POA: Diagnosis not present

## 2014-05-18 DIAGNOSIS — Z85528 Personal history of other malignant neoplasm of kidney: Secondary | ICD-10-CM | POA: Diagnosis not present

## 2014-05-18 DIAGNOSIS — Z947 Corneal transplant status: Secondary | ICD-10-CM | POA: Diagnosis not present

## 2014-05-18 DIAGNOSIS — E039 Hypothyroidism, unspecified: Secondary | ICD-10-CM | POA: Diagnosis not present

## 2014-05-18 DIAGNOSIS — Z8673 Personal history of transient ischemic attack (TIA), and cerebral infarction without residual deficits: Secondary | ICD-10-CM | POA: Diagnosis not present

## 2014-05-18 DIAGNOSIS — Z87891 Personal history of nicotine dependence: Secondary | ICD-10-CM | POA: Diagnosis not present

## 2014-05-18 DIAGNOSIS — Z905 Acquired absence of kidney: Secondary | ICD-10-CM | POA: Diagnosis not present

## 2014-05-18 DIAGNOSIS — I10 Essential (primary) hypertension: Secondary | ICD-10-CM | POA: Diagnosis not present

## 2014-05-18 DIAGNOSIS — H2511 Age-related nuclear cataract, right eye: Secondary | ICD-10-CM | POA: Diagnosis not present

## 2014-05-18 DIAGNOSIS — Z961 Presence of intraocular lens: Secondary | ICD-10-CM | POA: Diagnosis not present

## 2014-05-18 DIAGNOSIS — H1851 Endothelial corneal dystrophy: Secondary | ICD-10-CM | POA: Diagnosis not present

## 2014-05-18 DIAGNOSIS — Z8572 Personal history of non-Hodgkin lymphomas: Secondary | ICD-10-CM | POA: Diagnosis not present

## 2014-05-23 ENCOUNTER — Ambulatory Visit (HOSPITAL_COMMUNITY)
Admission: RE | Admit: 2014-05-23 | Discharge: 2014-05-23 | Disposition: A | Discharge status: Home / Self Care | Payer: Medicare Other | Source: Ambulatory Visit | Attending: Internal Medicine | Admitting: Internal Medicine

## 2014-05-23 ENCOUNTER — Other Ambulatory Visit (HOSPITAL_BASED_OUTPATIENT_CLINIC_OR_DEPARTMENT_OTHER): Payer: Medicare Other

## 2014-05-23 ENCOUNTER — Ambulatory Visit (HOSPITAL_BASED_OUTPATIENT_CLINIC_OR_DEPARTMENT_OTHER): Payer: Medicare Other

## 2014-05-23 ENCOUNTER — Telehealth: Payer: Self-pay | Admitting: *Deleted

## 2014-05-23 VITALS — BP 171/83 | HR 62 | Temp 97.7°F | Resp 18

## 2014-05-23 DIAGNOSIS — C641 Malignant neoplasm of right kidney, except renal pelvis: Secondary | ICD-10-CM | POA: Diagnosis present

## 2014-05-23 DIAGNOSIS — C8330 Diffuse large B-cell lymphoma, unspecified site: Secondary | ICD-10-CM

## 2014-05-23 DIAGNOSIS — Z95828 Presence of other vascular implants and grafts: Secondary | ICD-10-CM

## 2014-05-23 DIAGNOSIS — Z85528 Personal history of other malignant neoplasm of kidney: Secondary | ICD-10-CM | POA: Diagnosis not present

## 2014-05-23 DIAGNOSIS — C8590 Non-Hodgkin lymphoma, unspecified, unspecified site: Secondary | ICD-10-CM | POA: Insufficient documentation

## 2014-05-23 DIAGNOSIS — Z905 Acquired absence of kidney: Secondary | ICD-10-CM | POA: Diagnosis not present

## 2014-05-23 LAB — COMPREHENSIVE METABOLIC PANEL (CC13)
ALT: 15 U/L (ref 0–55)
AST: 20 U/L (ref 5–34)
Albumin: 3.5 g/dL (ref 3.5–5.0)
Alkaline Phosphatase: 99 U/L (ref 40–150)
Anion Gap: 12 mEq/L — ABNORMAL HIGH (ref 3–11)
BILIRUBIN TOTAL: 0.57 mg/dL (ref 0.20–1.20)
BUN: 14 mg/dL (ref 7.0–26.0)
CO2: 28 meq/L (ref 22–29)
Calcium: 9.4 mg/dL (ref 8.4–10.4)
Chloride: 98 mEq/L (ref 98–109)
Creatinine: 0.7 mg/dL (ref 0.6–1.1)
EGFR: 74 mL/min/{1.73_m2} — ABNORMAL LOW (ref >90)
Glucose: 90 mg/dl (ref 70–140)
Potassium: 3.9 mEq/L (ref 3.5–5.1)
SODIUM: 137 meq/L (ref 136–145)
TOTAL PROTEIN: 6 g/dL — AB (ref 6.4–8.3)

## 2014-05-23 LAB — LACTATE DEHYDROGENASE (CC13): LDH: 198 U/L (ref 125–245)

## 2014-05-23 LAB — CBC WITH DIFFERENTIAL/PLATELET
BASO%: 0 % (ref 0.0–2.0)
BASOS ABS: 0 10*3/uL (ref 0.0–0.1)
EOS%: 1.9 % (ref 0.0–7.0)
Eosinophils Absolute: 0.2 10*3/uL (ref 0.0–0.5)
HCT: 40.6 % (ref 34.8–46.6)
HGB: 13.8 g/dL (ref 11.6–15.9)
LYMPH#: 0.7 10*3/uL — AB (ref 0.9–3.3)
LYMPH%: 8.8 % — AB (ref 14.0–49.7)
MCH: 35.3 pg — AB (ref 25.1–34.0)
MCHC: 34 g/dL (ref 31.5–36.0)
MCV: 103.8 fL — ABNORMAL HIGH (ref 79.5–101.0)
MONO#: 0.6 10*3/uL (ref 0.1–0.9)
MONO%: 7.9 % (ref 0.0–14.0)
NEUT#: 6.4 10*3/uL (ref 1.5–6.5)
NEUT%: 81.4 % — AB (ref 38.4–76.8)
Platelets: 229 10*3/uL (ref 145–400)
RBC: 3.91 10*6/uL (ref 3.70–5.45)
RDW: 15.1 % — ABNORMAL HIGH (ref 11.2–14.5)
WBC: 7.8 10*3/uL (ref 3.9–10.3)

## 2014-05-23 MED ORDER — HEPARIN SOD (PORK) LOCK FLUSH 100 UNIT/ML IV SOLN
500.0000 [IU] | Freq: Once | INTRAVENOUS | Status: AC
Start: 2014-05-23 — End: 2014-05-23
  Administered 2014-05-23: 500 [IU] via INTRAVENOUS
  Filled 2014-05-23: qty 5

## 2014-05-23 MED ORDER — SODIUM CHLORIDE 0.9 % IJ SOLN
10.0000 mL | INTRAMUSCULAR | Status: DC | PRN
  Administered 2014-05-23: 10 mL via INTRAVENOUS
  Filled 2014-05-23: qty 10

## 2014-05-23 NOTE — Patient Instructions (Signed)

## 2014-05-23 NOTE — Telephone Encounter (Signed)
TC from [facility] with report of today's CT scan. Results will be available in EPIC shortly. "Status post right nephrectomy. No evidence of metastatic renal cell carcinoma or adenopathy to suggest active lymphoma."

## 2014-05-24 ENCOUNTER — Other Ambulatory Visit: Payer: Medicare Other

## 2014-05-25 ENCOUNTER — Telehealth: Payer: Self-pay | Admitting: *Deleted

## 2014-05-25 NOTE — Telephone Encounter (Signed)
Pt. Called to cancel appt for 05/31/14 with Dr. Julien Nordmann and would like to reschedule for one of the 1st 3 days in June

## 2014-05-26 ENCOUNTER — Telehealth: Payer: Self-pay | Admitting: Internal Medicine

## 2014-05-26 NOTE — Telephone Encounter (Signed)
returned call and line was busy

## 2014-05-26 NOTE — Telephone Encounter (Signed)
Spoke with patient and we have rescheduled her appointment per her request to dr Julien Nordmann next available

## 2014-05-30 DIAGNOSIS — E039 Hypothyroidism, unspecified: Secondary | ICD-10-CM | POA: Diagnosis not present

## 2014-05-30 DIAGNOSIS — Z961 Presence of intraocular lens: Secondary | ICD-10-CM | POA: Diagnosis not present

## 2014-05-30 DIAGNOSIS — H3581 Retinal edema: Secondary | ICD-10-CM | POA: Diagnosis not present

## 2014-05-30 DIAGNOSIS — Z905 Acquired absence of kidney: Secondary | ICD-10-CM | POA: Diagnosis not present

## 2014-05-30 DIAGNOSIS — Z8673 Personal history of transient ischemic attack (TIA), and cerebral infarction without residual deficits: Secondary | ICD-10-CM | POA: Diagnosis not present

## 2014-05-30 DIAGNOSIS — Z87891 Personal history of nicotine dependence: Secondary | ICD-10-CM | POA: Diagnosis not present

## 2014-05-30 DIAGNOSIS — Z85528 Personal history of other malignant neoplasm of kidney: Secondary | ICD-10-CM | POA: Diagnosis not present

## 2014-05-30 DIAGNOSIS — T86848 Other complications of corneal transplant: Secondary | ICD-10-CM | POA: Diagnosis not present

## 2014-05-30 DIAGNOSIS — I10 Essential (primary) hypertension: Secondary | ICD-10-CM | POA: Diagnosis not present

## 2014-05-30 DIAGNOSIS — T86841 Corneal transplant failure: Secondary | ICD-10-CM | POA: Diagnosis not present

## 2014-05-30 DIAGNOSIS — Z8572 Personal history of non-Hodgkin lymphomas: Secondary | ICD-10-CM | POA: Diagnosis not present

## 2014-05-30 DIAGNOSIS — R600 Localized edema: Secondary | ICD-10-CM | POA: Diagnosis not present

## 2014-05-30 DIAGNOSIS — H3530 Unspecified macular degeneration: Secondary | ICD-10-CM | POA: Diagnosis not present

## 2014-05-30 DIAGNOSIS — T85328A Displacement of other ocular prosthetic devices, implants and grafts, initial encounter: Secondary | ICD-10-CM | POA: Diagnosis not present

## 2014-05-31 ENCOUNTER — Ambulatory Visit: Payer: Medicare Other | Admitting: Internal Medicine

## 2014-05-31 DIAGNOSIS — H1851 Endothelial corneal dystrophy: Secondary | ICD-10-CM | POA: Diagnosis not present

## 2014-06-02 ENCOUNTER — Other Ambulatory Visit: Payer: Self-pay | Admitting: Medical Oncology

## 2014-06-02 DIAGNOSIS — C8330 Diffuse large B-cell lymphoma, unspecified site: Secondary | ICD-10-CM

## 2014-06-28 ENCOUNTER — Encounter: Payer: Self-pay | Admitting: Internal Medicine

## 2014-06-28 ENCOUNTER — Other Ambulatory Visit (HOSPITAL_BASED_OUTPATIENT_CLINIC_OR_DEPARTMENT_OTHER): Payer: Medicare Other

## 2014-06-28 ENCOUNTER — Ambulatory Visit (HOSPITAL_BASED_OUTPATIENT_CLINIC_OR_DEPARTMENT_OTHER): Payer: Medicare Other | Admitting: Internal Medicine

## 2014-06-28 ENCOUNTER — Telehealth: Payer: Self-pay | Admitting: Internal Medicine

## 2014-06-28 VITALS — BP 161/80 | HR 59 | Temp 98.4°F | Resp 17 | Ht 63.0 in | Wt 114.4 lb

## 2014-06-28 DIAGNOSIS — C7800 Secondary malignant neoplasm of unspecified lung: Secondary | ICD-10-CM | POA: Diagnosis not present

## 2014-06-28 DIAGNOSIS — C641 Malignant neoplasm of right kidney, except renal pelvis: Secondary | ICD-10-CM

## 2014-06-28 DIAGNOSIS — C8330 Diffuse large B-cell lymphoma, unspecified site: Secondary | ICD-10-CM | POA: Diagnosis not present

## 2014-06-28 DIAGNOSIS — Z5111 Encounter for antineoplastic chemotherapy: Secondary | ICD-10-CM | POA: Insufficient documentation

## 2014-06-28 LAB — COMPREHENSIVE METABOLIC PANEL (CC13)
ALBUMIN: 3.7 g/dL (ref 3.5–5.0)
ALT: 19 U/L (ref 0–55)
AST: 22 U/L (ref 5–34)
Alkaline Phosphatase: 97 U/L (ref 40–150)
Anion Gap: 7 mEq/L (ref 3–11)
BILIRUBIN TOTAL: 0.65 mg/dL (ref 0.20–1.20)
BUN: 15.6 mg/dL (ref 7.0–26.0)
CO2: 32 mEq/L — ABNORMAL HIGH (ref 22–29)
CREATININE: 0.9 mg/dL (ref 0.6–1.1)
Calcium: 9.5 mg/dL (ref 8.4–10.4)
Chloride: 100 mEq/L (ref 98–109)
EGFR: 57 mL/min/{1.73_m2} — AB (ref >90)
GLUCOSE: 86 mg/dL (ref 70–140)
POTASSIUM: 4.1 meq/L (ref 3.5–5.1)
Sodium: 139 mEq/L (ref 136–145)
Total Protein: 6.2 g/dL — ABNORMAL LOW (ref 6.4–8.3)

## 2014-06-28 LAB — CBC WITH DIFFERENTIAL/PLATELET
BASO%: 0.7 % (ref 0.0–2.0)
Basophils Absolute: 0 10*3/uL (ref 0.0–0.1)
EOS%: 2.1 % (ref 0.0–7.0)
Eosinophils Absolute: 0.1 10*3/uL (ref 0.0–0.5)
HCT: 41.9 % (ref 34.8–46.6)
HEMOGLOBIN: 14 g/dL (ref 11.6–15.9)
LYMPH%: 12.7 % — AB (ref 14.0–49.7)
MCH: 34.7 pg — ABNORMAL HIGH (ref 25.1–34.0)
MCHC: 33.3 g/dL (ref 31.5–36.0)
MCV: 104.1 fL — ABNORMAL HIGH (ref 79.5–101.0)
MONO#: 0.4 10*3/uL (ref 0.1–0.9)
MONO%: 8.7 % (ref 0.0–14.0)
NEUT%: 75.8 % (ref 38.4–76.8)
NEUTROS ABS: 3.5 10*3/uL (ref 1.5–6.5)
Platelets: 251 10*3/uL (ref 145–400)
RBC: 4.03 10*6/uL (ref 3.70–5.45)
RDW: 15.5 % — ABNORMAL HIGH (ref 11.2–14.5)
WBC: 4.7 10*3/uL (ref 3.9–10.3)
lymph#: 0.6 10*3/uL — ABNORMAL LOW (ref 0.9–3.3)

## 2014-06-28 NOTE — Progress Notes (Signed)
[facility] Telephone:(336) [phone]   Fax:(336) [phone]  OFFICE PROGRESS NOTE  Melissa Coma, MD [address] [place] [address]  DIAGNOSIS:  #1 non-Hodgkin's lymphoma diagnosed on bone marrow biopsy in June of 2012  #2 metastatic renal cell carcinoma initially diagnosed as stage III (T3a N0M0) renal cell carcinoma diagnosed in June of 2012 with pulmonary metastasis diagnosed in August of 2014.  PRIOR THERAPY:  1) Status post right nephrectomy under the care of Dr. Diona Fanti on 09/02/2010.  2) Systemic chemotherapy with CHOP/Rituxan given every 3 weeks, status post 6 cycles.   CURRENT THERAPY: Votrient 800 mg by mouth daily. Status post 20 months of treatment.  INTERVAL HISTORY: Melissa Cross 81 y.o. female returns to the clinic today for followup visit accompanied by her son. The patient is feeling fine today with no specific complaints. She recently underwent another eye surgery and recovered well. She is tolerating her treatment with Votrient fairly well with no significant adverse effect except for mild fatigue. She denied having any significant weight loss or night sweats. The patient denied having any chest pain, shortness of breath, cough or hemoptysis. She has no nausea or vomiting. The patient denied having any fever or chills. She had repeat CT scan of the chest, abdomen and pelvis performed last months but the patient missed her appointment with me at that time because of the eye surgery. She is here today for reevaluation and discussion of her scan and treatment options.  MEDICAL HISTORY: Past Medical History  Diagnosis Date  . Hypercholesteremia     takes Zocor daily  . Glaucoma   . Anemia   . Stroke     hx of TIA  . Blood transfusion   . Thrush 12/17/2010  . Hypertension     takes Nadolol daily  . Hypothyroidism     takes Synthroid daily  . Hypothyroid   . PONV (postoperative nausea and vomiting)   . Shortness of  breath     with exertion  . Headache(784.0)     hx of migraines  . TIA (transient ischemic attack)   . Peripheral neuropathy   . Arthritis     knee /hip  . Scoliosis   . History of bladder infections   . History of blood transfusion   . Cataract     right  . History of shingles   . Renal cell carcinoma   . Non Hodgkin's lymphoma   . Cancer     kidney cancer, NHL   . Cancer     last chemo 11/04/10     ALLERGIES:  is allergic to crab and dilaudid.  MEDICATIONS:  Current Outpatient Prescriptions  Medication Sig Dispense Refill  . amLODipine (NORVASC) 5 MG tablet Take 1 tablet (5 mg total) by mouth daily. 30 tablet 0  . aspirin EC 81 MG tablet Take 81 mg by mouth at bedtime.     . Calcium Citrate (CITRACAL PO) Take 1 tablet by mouth 3 (three) times a week.     . cholecalciferol (VITAMIN D) 400 UNITS TABS Take 400 Units by mouth daily.      [name] levothyroxine (SYNTHROID, LEVOTHROID) 50 MCG tablet Take 50 mcg by mouth every morning.     . Multiple Vitamin (MULITIVITAMIN WITH MINERALS) TABS Take 1 tablet by mouth daily.      . nadolol (CORGARD) 40 MG tablet Take 40 mg by mouth at bedtime.     . pazopanib (VOTRIENT)  200 MG tablet Take 4 tablets (800 mg total) by mouth daily. Take on an empty stomach. 120 tablet 11  . prednisoLONE acetate (PRED FORTE) 1 % ophthalmic suspension 1 drop.    . vitamin B-12 (CYANOCOBALAMIN) 1000 MCG tablet Place 1,000 mcg under the tongue daily.     . Calcium-Magnesium-Vitamin D (CITRACAL CALCIUM+D) 600-40-500 MG-MG-UNIT TB24 Take 630 mg by mouth.     No current facility-administered medications for this visit.   Facility-Administered Medications Ordered in Other Visits  Medication Dose Route Frequency Provider Last Rate Last Dose  . sodium chloride 0.9 % injection 10 mL  10 mL Intravenous PRN Curt Bears, MD   10 mL at 05/17/12 0846    SURGICAL HISTORY:  Past Surgical History  Procedure Laterality Date  . Nephrectomy      Right Side  .  Portacath placement      right subclavian  . Tonsillectomy      as a child  . Mouth surgery    . Left cataract removed    . Video bronchoscopy with endobronchial ultrasound N/A 08/06/2012    Procedure: VIDEO BRONCHOSCOPY WITH ENDOBRONCHIAL ULTRASOUND;  Surgeon: Collene Gobble, MD;  Location: [facility];  Service: Thoracic;  Laterality: N/A;  . Video bronchoscopy with endobronchial navigation N/A 08/06/2012    Procedure: VIDEO BRONCHOSCOPY WITH ENDOBRONCHIAL NAVIGATION;  Surgeon: Collene Gobble, MD;  Location: [facility];  Service: Thoracic;  Laterality: N/A;    REVIEW OF SYSTEMS:  Constitutional: positive for fatigue Eyes: negative Ears, nose, mouth, throat, and face: negative Respiratory: negative Cardiovascular: negative Gastrointestinal: negative Genitourinary:negative Integument/breast: negative Hematologic/lymphatic: negative Musculoskeletal:negative Neurological: negative Behavioral/Psych: negative Endocrine: negative Allergic/Immunologic: negative   PHYSICAL EXAMINATION: General appearance: alert, cooperative, fatigued and no distress Head: Normocephalic, without obvious abnormality, atraumatic Neck: no adenopathy Lymph nodes: Cervical, supraclavicular, and axillary nodes normal. Resp: clear to auscultation bilaterally Cardio: regular rate and rhythm, S1, S2 normal, no murmur, click, rub or gallop GI: soft, non-tender; bowel sounds normal; no masses,  no organomegaly Extremities: extremities normal, atraumatic, no cyanosis or edema Neurologic: Alert and oriented X 3, normal strength and tone. Normal symmetric reflexes. Normal coordination and gait  ECOG PERFORMANCE STATUS: 2 - Symptomatic, <50% confined to bed  Blood pressure 161/80, pulse 59, temperature 98.4 F (36.9 C), temperature source Oral, resp. rate 17, height $RemoveBe'5\' 3"'[id number]$  (1.6 m), weight 114 lb 6.4 oz (51.891 kg), SpO2 98 %.  LABORATORY DATA: Lab Results  Component Value Date   WBC 4.7 06/28/2014   HGB 14.0 06/28/2014   HCT  41.9 06/28/2014   MCV 104.1* 06/28/2014   PLT 251 06/28/2014      Chemistry      Component Value Date/Time   NA 139 06/28/2014 0819   NA 136* 06/12/2013 0503   K 4.1 06/28/2014 0819   K 3.4* 06/12/2013 0503   CL 98 06/12/2013 0503   CL 102 04/01/2012 0805   CO2 32* 06/28/2014 0819   CO2 25 06/12/2013 0503   BUN 15.6 06/28/2014 0819   BUN 5* 06/12/2013 0503   CREATININE 0.9 06/28/2014 0819   CREATININE 0.79 06/12/2013 0503      Component Value Date/Time   CALCIUM 9.5 06/28/2014 0819   CALCIUM 8.9 06/12/2013 0503   ALKPHOS 97 06/28/2014 0819   ALKPHOS 94 06/09/2013 2230   AST 22 06/28/2014 0819   AST 23 06/09/2013 2230   ALT 19 06/28/2014 0819   ALT 17 06/09/2013 2230   BILITOT 0.65 06/28/2014 0819   BILITOT 0.9  06/09/2013 2230       RADIOGRAPHIC STUDIES: Ct Abdomen Pelvis Wo Contrast  05/23/2014   CLINICAL DATA:  Non-Hodgkin's lymphoma. Large cell. Right-sided renal cell carcinoma diagnosed in 2012. Staging. currently on oral chemotherapy. History of right nephrectomy.  EXAM: CT CHEST, ABDOMEN AND PELVIS WITHOUT CONTRAST  TECHNIQUE: Multidetector CT imaging of the chest, abdomen and pelvis was performed following the standard protocol without IV contrast.  COMPARISON:  02/20/2014  FINDINGS: CT CHEST FINDINGS  Mediastinum/Nodes: No supraclavicular adenopathy. A right-sided Port-A-Cath terminates at the caval atrial junction. No axillary adenopathy.  Aortic and branch vessel atherosclerosis. Normal heart size, without pericardial effusion. Multivessel coronary artery atherosclerosis. No mediastinal or definite hilar adenopathy, given limitations of unenhanced CT.  Lungs/Pleura: No pleural fluid. Minimal motion degradation. Right middle lobe scarring.  Musculoskeletal: No acute osseous abnormality.  CT ABDOMEN PELVIS FINDINGS  Hepatobiliary: Right hepatic lobe cyst. Normal gallbladder, without biliary ductal dilatation. A periampullary duodenal diverticulum.  Pancreas: Normal,  without mass or ductal dilatation.  Spleen: Normal  Adrenals/Urinary Tract: Normal right adrenal gland. Similar left adrenal thickening. Right nephrectomy, without locally recurrent disease. Normal noncontrast appearance of the left kidney, without hydronephrosis or hydroureter. Normal urinary bladder.  Stomach/Bowel: Normal stomach, without wall thickening. Wall thickening of the descending/ sigmoid junction is moderate. Surrounding inflammation is new, including on image 85 of series 2. No obstruction. Colonic stool burden suggests constipation. Normal terminal ileum and appendix. Normal small bowel.  Vascular/Lymphatic: Aortic and branch vessel atherosclerosis. No abdominopelvic adenopathy.  Reproductive: Pelvic floor laxity. Otherwise normal uterus. No adnexal mass.  Other: No significant free fluid.  Musculoskeletal: Osteopenia. Convex right lumbar spine curvature. Lumbosacral spondylosis.  IMPRESSION: 1. Status post right nephrectomy. No evidence of metastatic renal cell carcinoma or adenopathy to suggest active lymphoma. 2. Descending/sigmoid junction diverticulitis, non complicated. These results will be called to the ordering clinician or representative by the Radiologist Assistant, and communication documented in the PACS or zVision Dashboard.   Electronically Signed   By: Abigail Miyamoto M.D.   On: 05/23/2014 14:57   Ct Chest Wo Contrast  05/23/2014   CLINICAL DATA:  Non-Hodgkin's lymphoma. Large cell. Right-sided renal cell carcinoma diagnosed in 2012. Staging. currently on oral chemotherapy. History of right nephrectomy.  EXAM: CT CHEST, ABDOMEN AND PELVIS WITHOUT CONTRAST  TECHNIQUE: Multidetector CT imaging of the chest, abdomen and pelvis was performed following the standard protocol without IV contrast.  COMPARISON:  02/20/2014  FINDINGS: CT CHEST FINDINGS  Mediastinum/Nodes: No supraclavicular adenopathy. A right-sided Port-A-Cath terminates at the caval atrial junction. No axillary adenopathy.   Aortic and branch vessel atherosclerosis. Normal heart size, without pericardial effusion. Multivessel coronary artery atherosclerosis. No mediastinal or definite hilar adenopathy, given limitations of unenhanced CT.  Lungs/Pleura: No pleural fluid. Minimal motion degradation. Right middle lobe scarring.  Musculoskeletal: No acute osseous abnormality.  CT ABDOMEN PELVIS FINDINGS  Hepatobiliary: Right hepatic lobe cyst. Normal gallbladder, without biliary ductal dilatation. A periampullary duodenal diverticulum.  Pancreas: Normal, without mass or ductal dilatation.  Spleen: Normal  Adrenals/Urinary Tract: Normal right adrenal gland. Similar left adrenal thickening. Right nephrectomy, without locally recurrent disease. Normal noncontrast appearance of the left kidney, without hydronephrosis or hydroureter. Normal urinary bladder.  Stomach/Bowel: Normal stomach, without wall thickening. Wall thickening of the descending/ sigmoid junction is moderate. Surrounding inflammation is new, including on image 85 of series 2. No obstruction. Colonic stool burden suggests constipation. Normal terminal ileum and appendix. Normal small bowel.  Vascular/Lymphatic: Aortic and branch vessel atherosclerosis. No abdominopelvic adenopathy.  Reproductive: Pelvic floor laxity. Otherwise normal uterus. No adnexal mass.  Other: No significant free fluid.  Musculoskeletal: Osteopenia. Convex right lumbar spine curvature. Lumbosacral spondylosis.  IMPRESSION: 1. Status post right nephrectomy. No evidence of metastatic renal cell carcinoma or adenopathy to suggest active lymphoma. 2. Descending/sigmoid junction diverticulitis, non complicated. These results will be called to the ordering clinician or representative by the Radiologist Assistant, and communication documented in the PACS or zVision Dashboard.   Electronically Signed   By: Abigail Miyamoto M.D.   On: 05/23/2014 14:57    ASSESSMENT AND PLAN: This is a very pleasant 81 years old  white female with metastatic renal cell carcinoma.  She is currently on treatment with Votrient 800 mg by mouth daily status post 20 months of treatment and tolerating it fairly well.  Her recent CT scan of the chest, abdomen and pelvis showed no significant evidence for disease progression. I discussed the scan results and personally reviewed the images with the patient and her son. I recommended for her to continue her current treatment with Votrient 800 mg by mouth daily. She will come back for follow-up visit in one month for reevaluation after repeating CBC, comprehensive metabolic panel and LDH.  She was advised to call immediately if she has any concerning symptoms in the interval. The patient voices understanding of current disease status and treatment options and is in agreement with the current care plan.  All questions were answered. The patient knows to call the clinic with any problems, questions or concerns. We can certainly see the patient much sooner if necessary.  Disclaimer: This note was dictated with voice recognition software. Similar sounding words can inadvertently be transcribed and may not be corrected upon review.

## 2014-06-28 NOTE — Telephone Encounter (Signed)
Gave and printed appt sched and avs for pt for July  °

## 2014-07-03 ENCOUNTER — Other Ambulatory Visit: Payer: Self-pay

## 2014-07-26 ENCOUNTER — Telehealth: Payer: Self-pay | Admitting: Internal Medicine

## 2014-07-26 ENCOUNTER — Other Ambulatory Visit (HOSPITAL_BASED_OUTPATIENT_CLINIC_OR_DEPARTMENT_OTHER): Payer: Medicare Other

## 2014-07-26 ENCOUNTER — Ambulatory Visit: Payer: Medicare Other | Admitting: Physician Assistant

## 2014-07-26 ENCOUNTER — Ambulatory Visit (HOSPITAL_BASED_OUTPATIENT_CLINIC_OR_DEPARTMENT_OTHER): Payer: Medicare Other

## 2014-07-26 VITALS — BP 188/82 | HR 61 | Temp 97.9°F

## 2014-07-26 DIAGNOSIS — C641 Malignant neoplasm of right kidney, except renal pelvis: Secondary | ICD-10-CM | POA: Diagnosis not present

## 2014-07-26 DIAGNOSIS — C8330 Diffuse large B-cell lymphoma, unspecified site: Secondary | ICD-10-CM

## 2014-07-26 DIAGNOSIS — Z95828 Presence of other vascular implants and grafts: Secondary | ICD-10-CM

## 2014-07-26 LAB — CBC WITH DIFFERENTIAL/PLATELET
BASO%: 0.2 % (ref 0.0–2.0)
BASOS ABS: 0 10*3/uL (ref 0.0–0.1)
EOS%: 2.6 % (ref 0.0–7.0)
Eosinophils Absolute: 0.1 10*3/uL (ref 0.0–0.5)
HCT: 39.4 % (ref 34.8–46.6)
HEMOGLOBIN: 13.2 g/dL (ref 11.6–15.9)
LYMPH%: 16.5 % (ref 14.0–49.7)
MCH: 35 pg — AB (ref 25.1–34.0)
MCHC: 33.5 g/dL (ref 31.5–36.0)
MCV: 104.5 fL — AB (ref 79.5–101.0)
MONO#: 0.5 10*3/uL (ref 0.1–0.9)
MONO%: 10.2 % (ref 0.0–14.0)
NEUT#: 3.3 10*3/uL (ref 1.5–6.5)
NEUT%: 70.5 % (ref 38.4–76.8)
Platelets: 207 10*3/uL (ref 145–400)
RBC: 3.77 10*6/uL (ref 3.70–5.45)
RDW: 15.2 % — ABNORMAL HIGH (ref 11.2–14.5)
WBC: 4.6 10*3/uL (ref 3.9–10.3)
lymph#: 0.8 10*3/uL — ABNORMAL LOW (ref 0.9–3.3)

## 2014-07-26 LAB — COMPREHENSIVE METABOLIC PANEL (CC13)
ALBUMIN: 3.4 g/dL — AB (ref 3.5–5.0)
ALK PHOS: 92 U/L (ref 40–150)
ALT: 13 U/L (ref 0–55)
AST: 19 U/L (ref 5–34)
Anion Gap: 8 mEq/L (ref 3–11)
BUN: 16.1 mg/dL (ref 7.0–26.0)
CALCIUM: 9.1 mg/dL (ref 8.4–10.4)
CO2: 26 mEq/L (ref 22–29)
CREATININE: 0.8 mg/dL (ref 0.6–1.1)
Chloride: 105 mEq/L (ref 98–109)
EGFR: 64 mL/min/{1.73_m2} — AB (ref >90)
Glucose: 105 mg/dl (ref 70–140)
Potassium: 4.2 mEq/L (ref 3.5–5.1)
Sodium: 139 mEq/L (ref 136–145)
Total Bilirubin: 0.52 mg/dL (ref 0.20–1.20)
Total Protein: 5.8 g/dL — ABNORMAL LOW (ref 6.4–8.3)

## 2014-07-26 LAB — LACTATE DEHYDROGENASE (CC13): LDH: 201 U/L (ref 125–245)

## 2014-07-26 MED ORDER — HEPARIN SOD (PORK) LOCK FLUSH 100 UNIT/ML IV SOLN
500.0000 [IU] | Freq: Once | INTRAVENOUS | Status: AC
  Administered 2014-07-26: 500 [IU] via INTRAVENOUS
  Filled 2014-07-26: qty 5

## 2014-07-26 MED ORDER — SODIUM CHLORIDE 0.9 % IJ SOLN
10.0000 mL | INTRAMUSCULAR | Status: DC | PRN
  Administered 2014-07-26: 10 mL via INTRAVENOUS
  Filled 2014-07-26: qty 10

## 2014-07-26 NOTE — Patient Instructions (Signed)

## 2014-07-26 NOTE — Telephone Encounter (Signed)
s.w. pt and r/s AJ appt...due to AJ out of the office today....pt ok and aware of new d.t

## 2014-07-31 ENCOUNTER — Telehealth: Payer: Self-pay | Admitting: Internal Medicine

## 2014-07-31 ENCOUNTER — Ambulatory Visit (HOSPITAL_BASED_OUTPATIENT_CLINIC_OR_DEPARTMENT_OTHER): Payer: Medicare Other | Admitting: Physician Assistant

## 2014-07-31 ENCOUNTER — Encounter: Payer: Self-pay | Admitting: Physician Assistant

## 2014-07-31 VITALS — BP 130/72 | HR 64 | Temp 98.4°F | Resp 18 | Ht 63.0 in | Wt 115.9 lb

## 2014-07-31 DIAGNOSIS — C8330 Diffuse large B-cell lymphoma, unspecified site: Secondary | ICD-10-CM

## 2014-07-31 DIAGNOSIS — C641 Malignant neoplasm of right kidney, except renal pelvis: Secondary | ICD-10-CM

## 2014-07-31 DIAGNOSIS — C7800 Secondary malignant neoplasm of unspecified lung: Secondary | ICD-10-CM

## 2014-07-31 NOTE — Progress Notes (Signed)
[facility] [phone]   [phone]  OFFICE PROGRESS NOTE  [name], MD [address] [place] [address]  DIAGNOSIS:  #1 non-Hodgkin's lymphoma diagnosed on bone marrow biopsy in June of 2012  #2 metastatic renal cell carcinoma initially diagnosed as stage III (T3a N0M0) renal cell carcinoma diagnosed in June of 2012 with pulmonary metastasis diagnosed in August of 2014.  PRIOR THERAPY:  1) Status post right nephrectomy under the care of Dr. Diona Fanti on 09/02/2010.  2) Systemic chemotherapy with CHOP/Rituxan given every 3 weeks, status post 6 cycles.   CURRENT THERAPY: Votrient 800 mg by mouth daily. Status post 21 months of treatment.  INTERVAL HISTORY: Melissa Cross 81 y.o. female returns to the clinic today for followup visit accompanied by her son. The patient is feeling fine today with no specific complaints. She has some mild lower extremity peripheral neuropathy and peripheral vascular issues that her son states have been a long-standing issue. She is tolerating her treatment with Votrient fairly well with no significant adverse effect except for mild fatigue. She denied having any significant weight loss or night sweats. The patient denied having any chest pain, shortness of breath, cough or hemoptysis. She has no nausea or vomiting. The patient denied having any fever or chills.   MEDICAL HISTORY: Past Medical History  Diagnosis Date  . Hypercholesteremia     takes Zocor daily  . Glaucoma   . Anemia   . Stroke     hx of TIA  . Blood transfusion   . Thrush 12/17/2010  . Hypertension     takes Nadolol daily  . Hypothyroidism     takes Synthroid daily  . Hypothyroid   . PONV (postoperative nausea and vomiting)   . Shortness of breath     with exertion  . Headache(784.0)     hx of migraines  . TIA (transient ischemic attack)   . Peripheral neuropathy   . Arthritis     knee /hip  . Scoliosis     . History of bladder infections   . History of blood transfusion   . Cataract     right  . History of shingles   . Renal cell carcinoma   . Non Hodgkin's lymphoma   . Cancer     kidney cancer, NHL   . Cancer     last chemo 11/04/10     ALLERGIES:  is allergic to crab and dilaudid.  MEDICATIONS:  Current Outpatient Prescriptions  Medication Sig Dispense Refill  . amLODipine (NORVASC) 5 MG tablet Take 1 tablet (5 mg total) by mouth daily. 30 tablet 0  . aspirin EC 81 MG tablet Take 81 mg by mouth at bedtime.     . Calcium Citrate (CITRACAL PO) Take 1 tablet by mouth 3 (three) times a week.     . Calcium-Magnesium-Vitamin D (CITRACAL CALCIUM+D) 600-40-500 MG-MG-UNIT TB24 Take 630 mg by mouth.    . cholecalciferol (VITAMIN D) 400 UNITS TABS Take 400 Units by mouth daily.      [name] levothyroxine (SYNTHROID, LEVOTHROID) 50 MCG tablet Take 50 mcg by mouth every morning.     . Multiple Vitamin (MULITIVITAMIN WITH MINERALS) TABS Take 1 tablet by mouth daily.      . nadolol (CORGARD) 40 MG tablet Take 40 mg by mouth at bedtime.     . pazopanib (VOTRIENT) 200 MG tablet Take 4 tablets (800 mg total) by mouth daily. Take on  an empty stomach. 120 tablet 11  . prednisoLONE acetate (PRED FORTE) 1 % ophthalmic suspension 1 drop.    . timolol (BETIMOL) 0.5 % ophthalmic solution Place 1 drop into the left eye 2 (two) times daily.    . vitamin B-12 (CYANOCOBALAMIN) 1000 MCG tablet Place 1,000 mcg under the tongue daily.      No current facility-administered medications for this visit.   Facility-Administered Medications Ordered in Other Visits  Medication Dose Route Frequency Provider Last Rate Last Dose  . sodium chloride 0.9 % injection 10 mL  10 mL Intravenous PRN Curt Bears, MD   10 mL at 05/17/12 0846    SURGICAL HISTORY:  Past Surgical History  Procedure Laterality Date  . Nephrectomy      Right Side  . Portacath placement      right subclavian  . Tonsillectomy      as a child  .  Mouth surgery    . Left cataract removed    . Video bronchoscopy with endobronchial ultrasound N/A 08/06/2012    Procedure: VIDEO BRONCHOSCOPY WITH ENDOBRONCHIAL ULTRASOUND;  Surgeon: Collene Gobble, MD;  Location: [facility];  Service: Thoracic;  Laterality: N/A;  . Video bronchoscopy with endobronchial navigation N/A 08/06/2012    Procedure: VIDEO BRONCHOSCOPY WITH ENDOBRONCHIAL NAVIGATION;  Surgeon: Collene Gobble, MD;  Location: [facility];  Service: Thoracic;  Laterality: N/A;    REVIEW OF SYSTEMS:  Constitutional: positive for fatigue Eyes: negative Ears, nose, mouth, throat, and face: negative Respiratory: negative Cardiovascular: negative Gastrointestinal: negative Genitourinary:negative Integument/breast: negative Hematologic/lymphatic: negative Musculoskeletal:negative Neurological: negative Behavioral/Psych: negative Endocrine: negative Allergic/Immunologic: negative   PHYSICAL EXAMINATION: General appearance: alert, cooperative, fatigued and no distress Head: Normocephalic, without obvious abnormality, atraumatic Neck: no adenopathy Lymph nodes: Cervical, supraclavicular, and axillary nodes normal. Resp: clear to auscultation bilaterally Cardio: regular rate and rhythm, S1, S2 normal, no murmur, click, rub or gallop GI: soft, non-tender; bowel sounds normal; no masses,  no organomegaly Extremities: extremities normal, atraumatic, no cyanosis or edema Neurologic: Alert and oriented X 3, normal strength and tone. Normal symmetric reflexes. Normal coordination and gait  ECOG PERFORMANCE STATUS: 1 - Symptomatic but completely ambulatory  Blood pressure 130/72, pulse 64, temperature 98.4 F (36.9 C), temperature source Oral, resp. rate 18, height $RemoveBe'5\' 3"'mJqZajToU$  (1.6 m), weight 115 lb 14.4 oz (52.572 kg), SpO2 100 %.  LABORATORY DATA: Lab Results  Component Value Date   WBC 4.6 07/26/2014   HGB 13.2 07/26/2014   HCT 39.4 07/26/2014   MCV 104.5* 07/26/2014   PLT 207 07/26/2014       Chemistry      Component Value Date/Time   NA 139 07/26/2014 0945   NA 136* 06/12/2013 0503   K 4.2 07/26/2014 0945   K 3.4* 06/12/2013 0503   CL 98 06/12/2013 0503   CL 102 04/01/2012 0805   CO2 26 07/26/2014 0945   CO2 25 06/12/2013 0503   BUN 16.1 07/26/2014 0945   BUN 5* 06/12/2013 0503   CREATININE 0.8 07/26/2014 0945   CREATININE 0.79 06/12/2013 0503      Component Value Date/Time   CALCIUM 9.1 07/26/2014 0945   CALCIUM 8.9 06/12/2013 0503   ALKPHOS 92 07/26/2014 0945   ALKPHOS 94 06/09/2013 2230   AST 19 07/26/2014 0945   AST 23 06/09/2013 2230   ALT 13 07/26/2014 0945   ALT 17 06/09/2013 2230   BILITOT 0.52 07/26/2014 0945   BILITOT 0.9 06/09/2013 2230       RADIOGRAPHIC STUDIES:  Ct Abdomen Pelvis Wo Contrast  05/23/2014   CLINICAL DATA:  Non-Hodgkin's lymphoma. Large cell. Right-sided renal cell carcinoma diagnosed in 2012. Staging. currently on oral chemotherapy. History of right nephrectomy.  EXAM: CT CHEST, ABDOMEN AND PELVIS WITHOUT CONTRAST  TECHNIQUE: Multidetector CT imaging of the chest, abdomen and pelvis was performed following the standard protocol without IV contrast.  COMPARISON:  02/20/2014  FINDINGS: CT CHEST FINDINGS  Mediastinum/Nodes: No supraclavicular adenopathy. A right-sided Port-A-Cath terminates at the caval atrial junction. No axillary adenopathy.  Aortic and branch vessel atherosclerosis. Normal heart size, without pericardial effusion. Multivessel coronary artery atherosclerosis. No mediastinal or definite hilar adenopathy, given limitations of unenhanced CT.  Lungs/Pleura: No pleural fluid. Minimal motion degradation. Right middle lobe scarring.  Musculoskeletal: No acute osseous abnormality.  CT ABDOMEN PELVIS FINDINGS  Hepatobiliary: Right hepatic lobe cyst. Normal gallbladder, without biliary ductal dilatation. A periampullary duodenal diverticulum.  Pancreas: Normal, without mass or ductal dilatation.  Spleen: Normal  Adrenals/Urinary Tract:  Normal right adrenal gland. Similar left adrenal thickening. Right nephrectomy, without locally recurrent disease. Normal noncontrast appearance of the left kidney, without hydronephrosis or hydroureter. Normal urinary bladder.  Stomach/Bowel: Normal stomach, without wall thickening. Wall thickening of the descending/ sigmoid junction is moderate. Surrounding inflammation is new, including on image 85 of series 2. No obstruction. Colonic stool burden suggests constipation. Normal terminal ileum and appendix. Normal small bowel.  Vascular/Lymphatic: Aortic and branch vessel atherosclerosis. No abdominopelvic adenopathy.  Reproductive: Pelvic floor laxity. Otherwise normal uterus. No adnexal mass.  Other: No significant free fluid.  Musculoskeletal: Osteopenia. Convex right lumbar spine curvature. Lumbosacral spondylosis.  IMPRESSION: 1. Status post right nephrectomy. No evidence of metastatic renal cell carcinoma or adenopathy to suggest active lymphoma. 2. Descending/sigmoid junction diverticulitis, non complicated. These results will be called to the ordering clinician or representative by the Radiologist Assistant, and communication documented in the PACS or zVision Dashboard.   Electronically Signed   By: Abigail Miyamoto M.D.   On: 05/23/2014 14:57   Ct Chest Wo Contrast  05/23/2014   CLINICAL DATA:  Non-Hodgkin's lymphoma. Large cell. Right-sided renal cell carcinoma diagnosed in 2012. Staging. currently on oral chemotherapy. History of right nephrectomy.  EXAM: CT CHEST, ABDOMEN AND PELVIS WITHOUT CONTRAST  TECHNIQUE: Multidetector CT imaging of the chest, abdomen and pelvis was performed following the standard protocol without IV contrast.  COMPARISON:  02/20/2014  FINDINGS: CT CHEST FINDINGS  Mediastinum/Nodes: No supraclavicular adenopathy. A right-sided Port-A-Cath terminates at the caval atrial junction. No axillary adenopathy.  Aortic and branch vessel atherosclerosis. Normal heart size, without  pericardial effusion. Multivessel coronary artery atherosclerosis. No mediastinal or definite hilar adenopathy, given limitations of unenhanced CT.  Lungs/Pleura: No pleural fluid. Minimal motion degradation. Right middle lobe scarring.  Musculoskeletal: No acute osseous abnormality.  CT ABDOMEN PELVIS FINDINGS  Hepatobiliary: Right hepatic lobe cyst. Normal gallbladder, without biliary ductal dilatation. A periampullary duodenal diverticulum.  Pancreas: Normal, without mass or ductal dilatation.  Spleen: Normal  Adrenals/Urinary Tract: Normal right adrenal gland. Similar left adrenal thickening. Right nephrectomy, without locally recurrent disease. Normal noncontrast appearance of the left kidney, without hydronephrosis or hydroureter. Normal urinary bladder.  Stomach/Bowel: Normal stomach, without wall thickening. Wall thickening of the descending/ sigmoid junction is moderate. Surrounding inflammation is new, including on image 85 of series 2. No obstruction. Colonic stool burden suggests constipation. Normal terminal ileum and appendix. Normal small bowel.  Vascular/Lymphatic: Aortic and branch vessel atherosclerosis. No abdominopelvic adenopathy.  Reproductive: Pelvic floor laxity. Otherwise normal uterus. No adnexal  mass.  Other: No significant free fluid.  Musculoskeletal: Osteopenia. Convex right lumbar spine curvature. Lumbosacral spondylosis.  IMPRESSION: 1. Status post right nephrectomy. No evidence of metastatic renal cell carcinoma or adenopathy to suggest active lymphoma. 2. Descending/sigmoid junction diverticulitis, non complicated. These results will be called to the ordering clinician or representative by the Radiologist Assistant, and communication documented in the PACS or zVision Dashboard.   Electronically Signed   By: Abigail Miyamoto M.D.   On: 05/23/2014 14:57    ASSESSMENT AND PLAN: This is a very pleasant 81 years old white female with metastatic renal cell carcinoma.  She is currently on  treatment with Votrient 800 mg by mouth daily status post 20 months of treatment and tolerating it fairly well.  Her last CT scan of the chest, abdomen and pelvis showed no significant evidence for disease progression. The patient was discussed with and also seen by Dr. Julien Nordmann. Recommended for her to continue her current treatment with Votrient 800 mg by mouth daily. She will come back for follow-up visit in one month for reevaluation after repeating CBC, comprehensive metabolic panel and LDH, and restaging CT scan of the chest, abdomen and pelvis without contrast to re-evaluate her disease.  She was advised to call immediately if she has any concerning symptoms in the interval. The patient voices understanding of current disease status and treatment options and is in agreement with the current care plan.  All questions were answered. The patient knows to call the clinic with any problems, questions or concerns. We can certainly see the patient much sooner if necessary.  Carlton Adam, PA-C 07/31/2014  ADDENDUM: Hematology/Oncology Attending: I had a face to face encounter with the patient today. I recommended her care plan. This is a very pleasant 81 years old white female with history of metastatic renal cell carcinoma currently undergoing treatment with Votrient 800 mg by mouth daily status post 21 months of treatment. She is tolerating her treatment well with no significant adverse effects. She denied having any significant weakness or fatigue. She has no nausea or vomiting. I recommended for the patient to continue her treatment with Votrient as a scheduled. She will come back for follow-up visit in one month's for reevaluation after repeating blood work as well as restaging scan of the chest, abdomen and pelvis. The patient was advised to call immediately if she has any concerning symptoms in the interval.  Disclaimer: This note was dictated with voice recognition software. Similar  sounding words can inadvertently be transcribed and may not be corrected upon review. Eilleen Kempf., MD 07/31/2014

## 2014-07-31 NOTE — Patient Instructions (Signed)
Continue Votrient at the current dose Follow up in 1 month with a restaging CT scan of your chest, abdomen and pelvis to re-evaluate your disease

## 2014-07-31 NOTE — Telephone Encounter (Signed)
Gave adn printed appt sched and avs fo rpt for Aug gave barium

## 2014-08-11 DIAGNOSIS — R350 Frequency of micturition: Secondary | ICD-10-CM | POA: Diagnosis not present

## 2014-08-11 DIAGNOSIS — B961 Klebsiella pneumoniae [K. pneumoniae] as the cause of diseases classified elsewhere: Secondary | ICD-10-CM | POA: Diagnosis not present

## 2014-08-11 DIAGNOSIS — R300 Dysuria: Secondary | ICD-10-CM | POA: Diagnosis not present

## 2014-08-11 DIAGNOSIS — N390 Urinary tract infection, site not specified: Secondary | ICD-10-CM | POA: Diagnosis not present

## 2014-08-15 ENCOUNTER — Telehealth: Payer: Self-pay | Admitting: *Deleted

## 2014-08-15 NOTE — Telephone Encounter (Signed)
Patient called stating that she would like a prescription for walker. Patient would like RN to call her back asap. Message sent to RN [facility]

## 2014-08-16 ENCOUNTER — Other Ambulatory Visit: Payer: Self-pay | Admitting: *Deleted

## 2014-08-16 MED ORDER — UNABLE TO FIND: Status: AC

## 2014-08-23 DIAGNOSIS — N3000 Acute cystitis without hematuria: Secondary | ICD-10-CM | POA: Diagnosis not present

## 2014-08-23 DIAGNOSIS — R300 Dysuria: Secondary | ICD-10-CM | POA: Diagnosis not present

## 2014-08-25 ENCOUNTER — Ambulatory Visit (HOSPITAL_COMMUNITY)
Admission: RE | Admit: 2014-08-25 | Discharge: 2014-08-25 | Disposition: A | Discharge status: Home / Self Care | Payer: Medicare Other | Source: Ambulatory Visit | Attending: Physician Assistant | Admitting: Physician Assistant

## 2014-08-25 ENCOUNTER — Encounter (HOSPITAL_COMMUNITY): Payer: Self-pay

## 2014-08-25 ENCOUNTER — Other Ambulatory Visit (HOSPITAL_BASED_OUTPATIENT_CLINIC_OR_DEPARTMENT_OTHER): Payer: Medicare Other

## 2014-08-25 DIAGNOSIS — C641 Malignant neoplasm of right kidney, except renal pelvis: Secondary | ICD-10-CM

## 2014-08-25 DIAGNOSIS — C8590 Non-Hodgkin lymphoma, unspecified, unspecified site: Secondary | ICD-10-CM | POA: Diagnosis not present

## 2014-08-25 DIAGNOSIS — N858 Other specified noninflammatory disorders of uterus: Secondary | ICD-10-CM | POA: Diagnosis not present

## 2014-08-25 DIAGNOSIS — I712 Thoracic aortic aneurysm, without rupture: Secondary | ICD-10-CM | POA: Insufficient documentation

## 2014-08-25 DIAGNOSIS — Z08 Encounter for follow-up examination after completed treatment for malignant neoplasm: Secondary | ICD-10-CM | POA: Diagnosis not present

## 2014-08-25 DIAGNOSIS — I7090 Unspecified atherosclerosis: Secondary | ICD-10-CM | POA: Insufficient documentation

## 2014-08-25 DIAGNOSIS — K5730 Diverticulosis of large intestine without perforation or abscess without bleeding: Secondary | ICD-10-CM | POA: Insufficient documentation

## 2014-08-25 DIAGNOSIS — N8320 Unspecified ovarian cysts: Secondary | ICD-10-CM | POA: Insufficient documentation

## 2014-08-25 DIAGNOSIS — C8330 Diffuse large B-cell lymphoma, unspecified site: Secondary | ICD-10-CM | POA: Diagnosis not present

## 2014-08-25 LAB — COMPREHENSIVE METABOLIC PANEL (CC13)
ALBUMIN: 3.6 g/dL (ref 3.5–5.0)
ALK PHOS: 87 U/L (ref 40–150)
ALT: 17 U/L (ref 0–55)
ANION GAP: 12 meq/L — AB (ref 3–11)
AST: 22 U/L (ref 5–34)
BUN: 10.4 mg/dL (ref 7.0–26.0)
CALCIUM: 9.5 mg/dL (ref 8.4–10.4)
CHLORIDE: 98 meq/L (ref 98–109)
CO2: 29 mEq/L (ref 22–29)
Creatinine: 1.1 mg/dL (ref 0.6–1.1)
EGFR: 45 mL/min/{1.73_m2} — AB (ref >90)
Glucose: 82 mg/dl (ref 70–140)
Potassium: 3.8 mEq/L (ref 3.5–5.1)
Sodium: 139 mEq/L (ref 136–145)
Total Bilirubin: 0.6 mg/dL (ref 0.20–1.20)
Total Protein: 6.2 g/dL — ABNORMAL LOW (ref 6.4–8.3)

## 2014-08-25 LAB — CBC WITH DIFFERENTIAL/PLATELET
BASO%: 0.4 % (ref 0.0–2.0)
Basophils Absolute: 0 10*3/uL (ref 0.0–0.1)
EOS%: 1.8 % (ref 0.0–7.0)
Eosinophils Absolute: 0.1 10*3/uL (ref 0.0–0.5)
HEMATOCRIT: 40.7 % (ref 34.8–46.6)
HGB: 13.8 g/dL (ref 11.6–15.9)
LYMPH#: 0.4 10*3/uL — AB (ref 0.9–3.3)
LYMPH%: 9.2 % — ABNORMAL LOW (ref 14.0–49.7)
MCH: 34.7 pg — AB (ref 25.1–34.0)
MCHC: 33.9 g/dL (ref 31.5–36.0)
MCV: 102.5 fL — ABNORMAL HIGH (ref 79.5–101.0)
MONO#: 0.3 10*3/uL (ref 0.1–0.9)
MONO%: 6.5 % (ref 0.0–14.0)
NEUT#: 3.8 10*3/uL (ref 1.5–6.5)
NEUT%: 82.1 % — AB (ref 38.4–76.8)
PLATELETS: 256 10*3/uL (ref 145–400)
RBC: 3.97 10*6/uL (ref 3.70–5.45)
RDW: 15.2 % — ABNORMAL HIGH (ref 11.2–14.5)
WBC: 4.6 10*3/uL (ref 3.9–10.3)

## 2014-08-25 LAB — LACTATE DEHYDROGENASE (CC13): LDH: 207 U/L (ref 125–245)

## 2014-08-30 ENCOUNTER — Ambulatory Visit (HOSPITAL_BASED_OUTPATIENT_CLINIC_OR_DEPARTMENT_OTHER): Payer: Medicare Other | Admitting: Internal Medicine

## 2014-08-30 ENCOUNTER — Telehealth: Payer: Self-pay | Admitting: Internal Medicine

## 2014-08-30 ENCOUNTER — Encounter: Payer: Self-pay | Admitting: Internal Medicine

## 2014-08-30 VITALS — BP 165/84 | HR 60 | Temp 97.9°F | Resp 17 | Ht 63.0 in | Wt 117.9 lb

## 2014-08-30 DIAGNOSIS — C641 Malignant neoplasm of right kidney, except renal pelvis: Secondary | ICD-10-CM

## 2014-08-30 DIAGNOSIS — N390 Urinary tract infection, site not specified: Secondary | ICD-10-CM

## 2014-08-30 DIAGNOSIS — C7800 Secondary malignant neoplasm of unspecified lung: Secondary | ICD-10-CM

## 2014-08-30 DIAGNOSIS — C8330 Diffuse large B-cell lymphoma, unspecified site: Secondary | ICD-10-CM | POA: Diagnosis not present

## 2014-08-30 NOTE — Progress Notes (Signed)
[facility] [phone]   Fax:([phone]  OFFICE PROGRESS NOTE  [name], MD [address] [place] [address]  DIAGNOSIS:  #1 non-Hodgkin's lymphoma diagnosed on bone marrow biopsy in June of 2012  #2 metastatic renal cell carcinoma initially diagnosed as stage III (T3a N0M0) renal cell carcinoma diagnosed in June of 2012 with pulmonary metastasis diagnosed in August of 2014.  PRIOR THERAPY:  1) Status post right nephrectomy under the care of Dr. Diona Fanti on 09/02/2010.  2) Systemic chemotherapy with CHOP/Rituxan given every 3 weeks, status post 6 cycles.   CURRENT THERAPY: Votrient 800 mg by mouth daily. Status post 22 months of treatment.  INTERVAL HISTORY: MADELAINE Cross 81 y.o. female returns to the clinic today for followup visit accompanied by her son. The patient is feeling fine today with no specific complaints. She is tolerating her treatment with Votrient fairly well with no significant adverse effects. She was treated recently for urinary tract infection. She denied having any significant weight loss or night sweats. The patient denied having any chest pain, shortness of breath, cough or hemoptysis. She has no nausea or vomiting. The patient denied having any fever or chills. She had a CT scan of the chest, abdomen and pelvis performed recently and she is here for evaluation and discussion of her scan results and treatment options.  MEDICAL HISTORY: Past Medical History  Diagnosis Date  . Hypercholesteremia     takes Zocor daily  . Glaucoma   . Anemia   . Stroke     hx of TIA  . Blood transfusion   . Thrush 12/17/2010  . Hypertension     takes Nadolol daily  . Hypothyroidism     takes Synthroid daily  . Hypothyroid   . PONV (postoperative nausea and vomiting)   . Shortness of breath     with exertion  . Headache(784.0)     hx of migraines  . TIA (transient ischemic attack)   . Peripheral  neuropathy   . Arthritis     knee /hip  . Scoliosis   . History of bladder infections   . History of blood transfusion   . Cataract     right  . History of shingles   . Renal cell carcinoma   . Non Hodgkin's lymphoma   . Cancer     kidney cancer, NHL   . Cancer     last chemo 11/04/10     ALLERGIES:  is allergic to crab and dilaudid.  MEDICATIONS:  Current Outpatient Prescriptions  Medication Sig Dispense Refill  . amLODipine (NORVASC) 5 MG tablet Take 1 tablet (5 mg total) by mouth daily. 30 tablet 0  . aspirin EC 81 MG tablet Take 81 mg by mouth at bedtime.     . Calcium Citrate (CITRACAL PO) Take 1 tablet by mouth 3 (three) times a week.     . Calcium-Magnesium-Vitamin D (CITRACAL CALCIUM+D) 600-40-500 MG-MG-UNIT TB24 Take 630 mg by mouth.    . cholecalciferol (VITAMIN D) 400 UNITS TABS Take 400 Units by mouth daily.      [name] levothyroxine (SYNTHROID, LEVOTHROID) 50 MCG tablet Take 50 mcg by mouth every morning.     . Multiple Vitamin (MULITIVITAMIN WITH MINERALS) TABS Take 1 tablet by mouth daily.      . nadolol (CORGARD) 40 MG tablet Take 40 mg by mouth at bedtime.     . pazopanib (VOTRIENT) 200 MG tablet Take 4  tablets (800 mg total) by mouth daily. Take on an empty stomach. 120 tablet 11  . prednisoLONE acetate (PRED FORTE) 1 % ophthalmic suspension 1 drop.    . timolol (BETIMOL) 0.5 % ophthalmic solution Place 1 drop into the left eye 2 (two) times daily.    [name] UNABLE TO FIND Walker  Use with ambulation 1 each 1  . vitamin B-12 (CYANOCOBALAMIN) 1000 MCG tablet Place 1,000 mcg under the tongue daily.      No current facility-administered medications for this visit.   Facility-Administered Medications Ordered in Other Visits  Medication Dose Route Frequency Provider Last Rate Last Dose  . sodium chloride 0.9 % injection 10 mL  10 mL Intravenous PRN Curt Bears, MD   10 mL at 05/17/12 0846    SURGICAL HISTORY:  Past Surgical History  Procedure Laterality Date  .  Nephrectomy      Right Side  . Portacath placement      right subclavian  . Tonsillectomy      as a child  . Mouth surgery    . Left cataract removed    . Video bronchoscopy with endobronchial ultrasound N/A 08/06/2012    Procedure: VIDEO BRONCHOSCOPY WITH ENDOBRONCHIAL ULTRASOUND;  Surgeon: Collene Gobble, MD;  Location: [facility];  Service: Thoracic;  Laterality: N/A;  . Video bronchoscopy with endobronchial navigation N/A 08/06/2012    Procedure: VIDEO BRONCHOSCOPY WITH ENDOBRONCHIAL NAVIGATION;  Surgeon: Collene Gobble, MD;  Location: [facility];  Service: Thoracic;  Laterality: N/A;    REVIEW OF SYSTEMS:  Constitutional: negative Eyes: negative Ears, nose, mouth, throat, and face: negative Respiratory: negative Cardiovascular: negative Gastrointestinal: negative Genitourinary:negative Integument/breast: negative Hematologic/lymphatic: negative Musculoskeletal:negative Neurological: negative Behavioral/Psych: negative Endocrine: negative Allergic/Immunologic: negative   PHYSICAL EXAMINATION: General appearance: alert, cooperative, fatigued and no distress Head: Normocephalic, without obvious abnormality, atraumatic Neck: no adenopathy Lymph nodes: Cervical, supraclavicular, and axillary nodes normal. Resp: clear to auscultation bilaterally Cardio: regular rate and rhythm, S1, S2 normal, no murmur, click, rub or gallop GI: soft, non-tender; bowel sounds normal; no masses,  no organomegaly Extremities: extremities normal, atraumatic, no cyanosis or edema Neurologic: Alert and oriented X 3, normal strength and tone. Normal symmetric reflexes. Normal coordination and gait  ECOG PERFORMANCE STATUS: 1 - Symptomatic but completely ambulatory  Blood pressure 165/84, pulse 60, temperature 97.9 F (36.6 C), temperature source Oral, resp. rate 17, height $RemoveBe'5\' 3"'OOhDVOKto$  (1.6 m), weight 117 lb 14.4 oz (53.479 kg), SpO2 100 %.  LABORATORY DATA: Lab Results  Component Value Date   WBC 4.6 08/25/2014    HGB 13.8 08/25/2014   HCT 40.7 08/25/2014   MCV 102.5* 08/25/2014   PLT 256 08/25/2014      Chemistry      Component Value Date/Time   NA 139 08/25/2014 0817   NA 136* 06/12/2013 0503   K 3.8 08/25/2014 0817   K 3.4* 06/12/2013 0503   CL 98 06/12/2013 0503   CL 102 04/01/2012 0805   CO2 29 08/25/2014 0817   CO2 25 06/12/2013 0503   BUN 10.4 08/25/2014 0817   BUN 5* 06/12/2013 0503   CREATININE 1.1 08/25/2014 0817   CREATININE 0.79 06/12/2013 0503      Component Value Date/Time   CALCIUM 9.5 08/25/2014 0817   CALCIUM 8.9 06/12/2013 0503   ALKPHOS 87 08/25/2014 0817   ALKPHOS 94 06/09/2013 2230   AST 22 08/25/2014 0817   AST 23 06/09/2013 2230   ALT 17 08/25/2014 0817   ALT 17 06/09/2013 2230  BILITOT 0.60 08/25/2014 0817   BILITOT 0.9 06/09/2013 2230       RADIOGRAPHIC STUDIES: Ct Abdomen Pelvis Wo Contrast  08/25/2014   CLINICAL DATA:  Large cell diffuse non-Hodgkin's lymphoma. Restaging of metastatic right renal cancer diagnosed in 2012 with prior right nephrectomy.  EXAM: CT CHEST, ABDOMEN AND PELVIS WITHOUT CONTRAST  TECHNIQUE: Multidetector CT imaging of the chest, abdomen and pelvis was performed following the standard protocol without IV contrast.  COMPARISON:  Multiple exams, including 05/23/2014  FINDINGS: CT CHEST FINDINGS  Mediastinum/Nodes: Coronary, aortic arch, and branch vessel atherosclerotic vascular disease. Ascending thoracic aortic aneurysm, 4.3 cm transverse, previously 4.2 cm by my measurement.  No pathologic thoracic adenopathy  Lungs/Pleura: Stable linear scarring in the right middle lobe.  Musculoskeletal: Unremarkable  CT ABDOMEN PELVIS FINDINGS  Hepatobiliary: Fluid density 1.7 by 1.3 cm in the right hepatic lobe, stable. Gallbladder poorly seen adjacent to bowel, internal density 33 Hounsfield units, may contain sludge.  Pancreas: Unremarkable  Spleen: Unremarkable  Adrenals/Urinary Tract: Stable fullness of the left adrenal gland. Right nephrectomy.   Stomach/Bowel: Wall thickening in the distal stomach body and antrum may be due to nondistention rather than gastritis. There is a sizable periampullary duodenal diverticulum on image 69 series 2. This may be mildly inflamed ; its wall seems to be thicker than is often encountered. If this has been present for years and accordingly I am doubtful that it represents an ulcerative mass.  Sigmoid diverticulosis observed.  Vascular/Lymphatic: Aortoiliac atherosclerotic vascular disease.  Reproductive: There 2 cystic lesions in the left ovary, 1 measuring up to 2.3 cm in long axis and the other measuring up to 1.7 cm in long axis. These are chronic and are actually decreased in size compared to 2014, accordingly most likely benign.  The endometrium is questionably widened up to 1.2 cm in width.  Other: Unremarkable  Musculoskeletal: Dextroconvex lumbar scoliosis. Transitional S1. Grade 1 retrolisthesis at L2-3 and L3-4.  IMPRESSION: 1. No adenopathy or specific findings of recurrent or active malignancy. 2. Ascending thoracic aortic aneurysm, 4.3 cm in diameter. Recommend annual imaging followup by CTA or MRA. This recommendation follows 2010 ACCF/AHA/AATS/ACR/ASA/SCA/SCAI/SIR/STS/SVM Guidelines for the Diagnosis and Management of Patients with Thoracic Aortic Disease. Circulation. 2010; 121: B262-M355 3. Chronic cystic lesions of the left ovary likely benign. The endometrium measures up to 1.2 cm in width which is abnormal for age; consider pelvic sonography for confirmation and further workup. 4. Sigmoid diverticulosis. 5. Atherosclerosis.   Electronically Signed   By: Van Clines M.D.   On: 08/25/2014 10:01   Ct Chest Wo Contrast  08/25/2014   CLINICAL DATA:  Large cell diffuse non-Hodgkin's lymphoma. Restaging of metastatic right renal cancer diagnosed in 2012 with prior right nephrectomy.  EXAM: CT CHEST, ABDOMEN AND PELVIS WITHOUT CONTRAST  TECHNIQUE: Multidetector CT imaging of the chest, abdomen and  pelvis was performed following the standard protocol without IV contrast.  COMPARISON:  Multiple exams, including 05/23/2014  FINDINGS: CT CHEST FINDINGS  Mediastinum/Nodes: Coronary, aortic arch, and branch vessel atherosclerotic vascular disease. Ascending thoracic aortic aneurysm, 4.3 cm transverse, previously 4.2 cm by my measurement.  No pathologic thoracic adenopathy  Lungs/Pleura: Stable linear scarring in the right middle lobe.  Musculoskeletal: Unremarkable  CT ABDOMEN PELVIS FINDINGS  Hepatobiliary: Fluid density 1.7 by 1.3 cm in the right hepatic lobe, stable. Gallbladder poorly seen adjacent to bowel, internal density 33 Hounsfield units, may contain sludge.  Pancreas: Unremarkable  Spleen: Unremarkable  Adrenals/Urinary Tract: Stable fullness of the left adrenal  gland. Right nephrectomy.  Stomach/Bowel: Wall thickening in the distal stomach body and antrum may be due to nondistention rather than gastritis. There is a sizable periampullary duodenal diverticulum on image 69 series 2. This may be mildly inflamed ; its wall seems to be thicker than is often encountered. If this has been present for years and accordingly I am doubtful that it represents an ulcerative mass.  Sigmoid diverticulosis observed.  Vascular/Lymphatic: Aortoiliac atherosclerotic vascular disease.  Reproductive: There 2 cystic lesions in the left ovary, 1 measuring up to 2.3 cm in long axis and the other measuring up to 1.7 cm in long axis. These are chronic and are actually decreased in size compared to 2014, accordingly most likely benign.  The endometrium is questionably widened up to 1.2 cm in width.  Other: Unremarkable  Musculoskeletal: Dextroconvex lumbar scoliosis. Transitional S1. Grade 1 retrolisthesis at L2-3 and L3-4.  IMPRESSION: 1. No adenopathy or specific findings of recurrent or active malignancy. 2. Ascending thoracic aortic aneurysm, 4.3 cm in diameter. Recommend annual imaging followup by CTA or MRA. This  recommendation follows 2010 ACCF/AHA/AATS/ACR/ASA/SCA/SCAI/SIR/STS/SVM Guidelines for the Diagnosis and Management of Patients with Thoracic Aortic Disease. Circulation. 2010; 121: E751-Z001 3. Chronic cystic lesions of the left ovary likely benign. The endometrium measures up to 1.2 cm in width which is abnormal for age; consider pelvic sonography for confirmation and further workup. 4. Sigmoid diverticulosis. 5. Atherosclerosis.   Electronically Signed   By: Van Clines M.D.   On: 08/25/2014 10:01   ASSESSMENT AND PLAN: This is a very pleasant 81 years old white female with metastatic renal cell carcinoma.  She is currently on treatment with Votrient 800 mg by mouth daily status post 22 months of treatment and tolerating it fairly well. The patient is feeling fine today and gaining some weight. Her recent CT scan of the chest, abdomen and pelvis showed no significant evidence for disease progression. I discussed the scan results and personally reviewed the images with the patient and her son. I recommended for her to continue her current treatment with Votrient 800 mg by mouth daily. She will come back for follow-up visit in 2 months for reevaluation after repeating CBC, comprehensive metabolic panel and LDH as well as Port-A-Cath flush.  She was advised to call immediately if she has any concerning symptoms in the interval. The patient voices understanding of current disease status and treatment options and is in agreement with the current care plan.  All questions were answered. The patient knows to call the clinic with any problems, questions or concerns. We can certainly see the patient much sooner if necessary.  Disclaimer: This note was dictated with voice recognition software. Similar sounding words can inadvertently be transcribed and may not be corrected upon review.

## 2014-08-30 NOTE — Telephone Encounter (Signed)
Gave and printed appt sched and avs fo rpt for OCT °

## 2014-10-02 DIAGNOSIS — Z9841 Cataract extraction status, right eye: Secondary | ICD-10-CM | POA: Diagnosis not present

## 2014-10-02 DIAGNOSIS — Z961 Presence of intraocular lens: Secondary | ICD-10-CM | POA: Diagnosis not present

## 2014-10-02 DIAGNOSIS — Z947 Corneal transplant status: Secondary | ICD-10-CM | POA: Diagnosis not present

## 2014-10-18 DIAGNOSIS — H3530 Unspecified macular degeneration: Secondary | ICD-10-CM | POA: Diagnosis not present

## 2014-10-18 DIAGNOSIS — Z947 Corneal transplant status: Secondary | ICD-10-CM | POA: Diagnosis not present

## 2014-10-18 DIAGNOSIS — Z961 Presence of intraocular lens: Secondary | ICD-10-CM | POA: Diagnosis not present

## 2014-10-18 DIAGNOSIS — Z9841 Cataract extraction status, right eye: Secondary | ICD-10-CM | POA: Diagnosis not present

## 2014-10-25 ENCOUNTER — Ambulatory Visit (HOSPITAL_BASED_OUTPATIENT_CLINIC_OR_DEPARTMENT_OTHER): Payer: Medicare Other | Admitting: Internal Medicine

## 2014-10-25 ENCOUNTER — Other Ambulatory Visit (HOSPITAL_BASED_OUTPATIENT_CLINIC_OR_DEPARTMENT_OTHER): Payer: Medicare Other

## 2014-10-25 ENCOUNTER — Other Ambulatory Visit: Payer: Self-pay | Admitting: Medical Oncology

## 2014-10-25 ENCOUNTER — Encounter: Payer: Self-pay | Admitting: Internal Medicine

## 2014-10-25 ENCOUNTER — Ambulatory Visit (HOSPITAL_BASED_OUTPATIENT_CLINIC_OR_DEPARTMENT_OTHER): Payer: Medicare Other

## 2014-10-25 ENCOUNTER — Telehealth: Payer: Self-pay | Admitting: Internal Medicine

## 2014-10-25 VITALS — BP 165/80 | HR 59 | Temp 98.2°F | Resp 18 | Ht 63.0 in | Wt 119.0 lb

## 2014-10-25 DIAGNOSIS — C641 Malignant neoplasm of right kidney, except renal pelvis: Secondary | ICD-10-CM

## 2014-10-25 DIAGNOSIS — C8330 Diffuse large B-cell lymphoma, unspecified site: Secondary | ICD-10-CM | POA: Diagnosis not present

## 2014-10-25 DIAGNOSIS — C7800 Secondary malignant neoplasm of unspecified lung: Secondary | ICD-10-CM

## 2014-10-25 DIAGNOSIS — Z95828 Presence of other vascular implants and grafts: Secondary | ICD-10-CM

## 2014-10-25 LAB — COMPREHENSIVE METABOLIC PANEL (CC13)
ALBUMIN: 3.6 g/dL (ref 3.5–5.0)
ALT: 16 U/L (ref 0–55)
AST: 21 U/L (ref 5–34)
Alkaline Phosphatase: 86 U/L (ref 40–150)
Anion Gap: 9 mEq/L (ref 3–11)
BUN: 13.1 mg/dL (ref 7.0–26.0)
CO2: 28 mEq/L (ref 22–29)
Calcium: 9.7 mg/dL (ref 8.4–10.4)
Chloride: 104 mEq/L (ref 98–109)
Creatinine: 0.8 mg/dL (ref 0.6–1.1)
EGFR: 63 mL/min/{1.73_m2} — AB (ref >90)
GLUCOSE: 100 mg/dL (ref 70–140)
Potassium: 4 mEq/L (ref 3.5–5.1)
SODIUM: 140 meq/L (ref 136–145)
TOTAL PROTEIN: 6.1 g/dL — AB (ref 6.4–8.3)
Total Bilirubin: 0.67 mg/dL (ref 0.20–1.20)

## 2014-10-25 LAB — CBC WITH DIFFERENTIAL/PLATELET
BASO%: 0.2 % (ref 0.0–2.0)
BASOS ABS: 0 10*3/uL (ref 0.0–0.1)
EOS ABS: 0.2 10*3/uL (ref 0.0–0.5)
EOS%: 3.4 % (ref 0.0–7.0)
HEMATOCRIT: 40 % (ref 34.8–46.6)
HEMOGLOBIN: 13.6 g/dL (ref 11.6–15.9)
LYMPH#: 0.7 10*3/uL — AB (ref 0.9–3.3)
LYMPH%: 15.6 % (ref 14.0–49.7)
MCH: 35.7 pg — AB (ref 25.1–34.0)
MCHC: 34 g/dL (ref 31.5–36.0)
MCV: 105 fL — ABNORMAL HIGH (ref 79.5–101.0)
MONO#: 0.3 10*3/uL (ref 0.1–0.9)
MONO%: 6.9 % (ref 0.0–14.0)
NEUT#: 3.2 10*3/uL (ref 1.5–6.5)
NEUT%: 73.9 % (ref 38.4–76.8)
PLATELETS: 220 10*3/uL (ref 145–400)
RBC: 3.81 10*6/uL (ref 3.70–5.45)
RDW: 16 % — AB (ref 11.2–14.5)
WBC: 4.4 10*3/uL (ref 3.9–10.3)

## 2014-10-25 LAB — LACTATE DEHYDROGENASE (CC13): LDH: 219 U/L (ref 125–245)

## 2014-10-25 MED ORDER — SODIUM CHLORIDE 0.9 % IJ SOLN
10.0000 mL | INTRAMUSCULAR | Status: DC | PRN
  Administered 2014-10-25: 10 mL via INTRAVENOUS
  Filled 2014-10-25: qty 10

## 2014-10-25 MED ORDER — HEPARIN SOD (PORK) LOCK FLUSH 100 UNIT/ML IV SOLN
500.0000 [IU] | Freq: Once | INTRAVENOUS | Status: AC
  Administered 2014-10-25: 500 [IU] via INTRAVENOUS
  Filled 2014-10-25: qty 5

## 2014-10-25 NOTE — Progress Notes (Signed)
[facility] Telephone:(336) [phone]   Fax:(336) [phone]  OFFICE PROGRESS NOTE  Melissa Coma, MD [address] [place] [address]  DIAGNOSIS:  #1 non-Hodgkin's lymphoma diagnosed on bone marrow biopsy in June of 2012  #2 metastatic renal cell carcinoma initially diagnosed as stage III (T3a N0M0) renal cell carcinoma diagnosed in June of 2012 with pulmonary metastasis diagnosed in August of 2014.  PRIOR THERAPY:  1) Status post right nephrectomy under the care of Dr. Diona Fanti on 09/02/2010.  2) Systemic chemotherapy with CHOP/Rituxan given every 3 weeks, status post 6 cycles.   CURRENT THERAPY: Votrient 800 mg by mouth daily. Status post 24 months of treatment.  INTERVAL HISTORY: Melissa Cross 81 y.o. female returns to the clinic today for followup visit accompanied by her daughter [name] is visiting from [place]. The patient is feeling fine today with no specific complaints. She is tolerating her treatment with Votrient fairly well with no significant adverse effects. She denied having any significant weight loss or night sweats. The patient denied having any chest pain, shortness of breath, cough or hemoptysis. She has no nausea or vomiting. The patient denied having any fever or chills. She had repeat CBC, complaints metabolic panel and LDH performed earlier today and she is here for evaluation and discussion of her lab results.  MEDICAL HISTORY: Past Medical History  Diagnosis Date  . Hypercholesteremia     takes Zocor daily  . Glaucoma   . Anemia   . Stroke ([facility])     hx of TIA  . Blood transfusion   . Thrush 12/17/2010  . Hypertension     takes Nadolol daily  . Hypothyroidism     takes Synthroid daily  . Hypothyroid   . PONV (postoperative nausea and vomiting)   . Shortness of breath     with exertion  . Headache(784.0)     hx of migraines  . TIA (transient ischemic attack)   . Peripheral neuropathy ([facility])   .  Arthritis     knee /hip  . Scoliosis   . History of bladder infections   . History of blood transfusion   . Cataract     right  . History of shingles   . Renal cell carcinoma   . Non Hodgkin's lymphoma ([facility])   . Cancer ([facility])     kidney cancer, NHL   . Cancer [facility])     last chemo 11/04/10     ALLERGIES:  is allergic to crab and dilaudid.  MEDICATIONS:  Current Outpatient Prescriptions  Medication Sig Dispense Refill  . amLODipine (NORVASC) 5 MG tablet Take 1 tablet (5 mg total) by mouth daily. 30 tablet 0  . aspirin EC 81 MG tablet Take 81 mg by mouth at bedtime.     . Calcium Citrate (CITRACAL PO) Take 1 tablet by mouth 3 (three) times a week.     . Calcium-Magnesium-Vitamin D (CITRACAL CALCIUM+D) 600-40-500 MG-MG-UNIT TB24 Take 630 mg by mouth.    . cholecalciferol (VITAMIN D) 400 UNITS TABS Take 400 Units by mouth daily.      [name] levothyroxine (SYNTHROID, LEVOTHROID) 50 MCG tablet Take 50 mcg by mouth every morning.     . Multiple Vitamin (MULITIVITAMIN WITH MINERALS) TABS Take 1 tablet by mouth daily.      . nadolol (CORGARD) 40 MG tablet Take 40 mg by mouth at bedtime.     . pazopanib (VOTRIENT) 200 MG tablet Take 4 tablets (  800 mg total) by mouth daily. Take on an empty stomach. 120 tablet 11  . prednisoLONE acetate (PRED FORTE) 1 % ophthalmic suspension 1 drop.    . timolol (BETIMOL) 0.5 % ophthalmic solution Place 1 drop into the left eye 2 (two) times daily.    . timolol (TIMOPTIC) 0.5 % ophthalmic solution 1 drop.  10  . UNABLE TO FIND Walker  Use with ambulation 1 each 1  . vitamin B-12 (CYANOCOBALAMIN) 1000 MCG tablet Place 1,000 mcg under the tongue daily.      No current facility-administered medications for this visit.   Facility-Administered Medications Ordered in Other Visits  Medication Dose Route Frequency Provider Last Rate Last Dose  . sodium chloride 0.9 % injection 10 mL  10 mL Intravenous PRN Curt Bears, MD   10 mL at 05/17/12 0846    SURGICAL  HISTORY:  Past Surgical History  Procedure Laterality Date  . Nephrectomy      Right Side  . Portacath placement      right subclavian  . Tonsillectomy      as a child  . Mouth surgery    . Left cataract removed    . Video bronchoscopy with endobronchial ultrasound N/A 08/06/2012    Procedure: VIDEO BRONCHOSCOPY WITH ENDOBRONCHIAL ULTRASOUND;  Surgeon: Collene Gobble, MD;  Location: [facility];  Service: Thoracic;  Laterality: N/A;  . Video bronchoscopy with endobronchial navigation N/A 08/06/2012    Procedure: VIDEO BRONCHOSCOPY WITH ENDOBRONCHIAL NAVIGATION;  Surgeon: Collene Gobble, MD;  Location: [facility];  Service: Thoracic;  Laterality: N/A;    REVIEW OF SYSTEMS:  A comprehensive review of systems was negative except for: Constitutional: positive for fatigue   PHYSICAL EXAMINATION: General appearance: alert, cooperative, fatigued and no distress Head: Normocephalic, without obvious abnormality, atraumatic Neck: no adenopathy Lymph nodes: Cervical, supraclavicular, and axillary nodes normal. Resp: clear to auscultation bilaterally Cardio: regular rate and rhythm, S1, S2 normal, no murmur, click, rub or gallop GI: soft, non-tender; bowel sounds normal; no masses,  no organomegaly Extremities: extremities normal, atraumatic, no cyanosis or edema Neurologic: Alert and oriented X 3, normal strength and tone. Normal symmetric reflexes. Normal coordination and gait  ECOG PERFORMANCE STATUS: 1 - Symptomatic but completely ambulatory  There were no vitals taken for this visit.  LABORATORY DATA: Lab Results  Component Value Date   WBC 4.4 10/25/2014   HGB 13.6 10/25/2014   HCT 40.0 10/25/2014   MCV 105.0* 10/25/2014   PLT 220 10/25/2014      Chemistry      Component Value Date/Time   NA 139 08/25/2014 0817   NA 136* 06/12/2013 0503   K 3.8 08/25/2014 0817   K 3.4* 06/12/2013 0503   CL 98 06/12/2013 0503   CL 102 04/01/2012 0805   CO2 29 08/25/2014 0817   CO2 25 06/12/2013 0503    BUN 10.4 08/25/2014 0817   BUN 5* 06/12/2013 0503   CREATININE 1.1 08/25/2014 0817   CREATININE 0.79 06/12/2013 0503      Component Value Date/Time   CALCIUM 9.5 08/25/2014 0817   CALCIUM 8.9 06/12/2013 0503   ALKPHOS 87 08/25/2014 0817   ALKPHOS 94 06/09/2013 2230   AST 22 08/25/2014 0817   AST 23 06/09/2013 2230   ALT 17 08/25/2014 0817   ALT 17 06/09/2013 2230   BILITOT 0.60 08/25/2014 0817   BILITOT 0.9 06/09/2013 2230       RADIOGRAPHIC STUDIES: No results found. ASSESSMENT AND PLAN: This is a very  pleasant 81 years old white female with metastatic renal cell carcinoma.  She is currently on treatment with Votrient 800 mg by mouth daily status post 24 months of treatment and tolerating it fairly well.  The patient is feeling fine today with no specific complaints except for mild fatigue. I recommended for her to continue her current treatment with Votrient 800 mg by mouth daily. She will come back for follow-up visit in 2 months for reevaluation after repeating CBC, comprehensive metabolic panel and LDH as well as Port-A-Cath flush. We will repeat her imaging study every 6 months. She was advised to call immediately if she has any concerning symptoms in the interval. The patient voices understanding of current disease status and treatment options and is in agreement with the current care plan.  All questions were answered. The patient knows to call the clinic with any problems, questions or concerns. We can certainly see the patient much sooner if necessary.  Disclaimer: This note was dictated with voice recognition software. Similar sounding words can inadvertently be transcribed and may not be corrected upon review.

## 2014-10-25 NOTE — Patient Instructions (Signed)

## 2014-10-25 NOTE — Telephone Encounter (Signed)
Gave and printed appt sched and avs for pt DEc

## 2014-10-28 DIAGNOSIS — Z23 Encounter for immunization: Secondary | ICD-10-CM | POA: Diagnosis not present

## 2014-11-06 DIAGNOSIS — H1851 Endothelial corneal dystrophy: Secondary | ICD-10-CM | POA: Diagnosis not present

## 2014-11-06 DIAGNOSIS — H532 Diplopia: Secondary | ICD-10-CM | POA: Diagnosis not present

## 2014-11-06 DIAGNOSIS — Z87891 Personal history of nicotine dependence: Secondary | ICD-10-CM | POA: Diagnosis not present

## 2014-11-06 DIAGNOSIS — Z9842 Cataract extraction status, left eye: Secondary | ICD-10-CM | POA: Diagnosis not present

## 2014-11-06 DIAGNOSIS — Z961 Presence of intraocular lens: Secondary | ICD-10-CM | POA: Diagnosis not present

## 2014-11-06 DIAGNOSIS — Z7982 Long term (current) use of aspirin: Secondary | ICD-10-CM | POA: Diagnosis not present

## 2014-12-20 ENCOUNTER — Encounter: Payer: Self-pay | Admitting: Internal Medicine

## 2014-12-20 ENCOUNTER — Ambulatory Visit (HOSPITAL_BASED_OUTPATIENT_CLINIC_OR_DEPARTMENT_OTHER): Payer: Medicare Other | Admitting: Internal Medicine

## 2014-12-20 ENCOUNTER — Telehealth: Payer: Self-pay | Admitting: Internal Medicine

## 2014-12-20 ENCOUNTER — Other Ambulatory Visit (HOSPITAL_BASED_OUTPATIENT_CLINIC_OR_DEPARTMENT_OTHER): Payer: Medicare Other

## 2014-12-20 ENCOUNTER — Ambulatory Visit (HOSPITAL_BASED_OUTPATIENT_CLINIC_OR_DEPARTMENT_OTHER): Payer: Medicare Other

## 2014-12-20 VITALS — BP 159/78 | HR 56 | Temp 98.0°F | Resp 17 | Ht 63.0 in | Wt 116.3 lb

## 2014-12-20 DIAGNOSIS — C8330 Diffuse large B-cell lymphoma, unspecified site: Secondary | ICD-10-CM

## 2014-12-20 DIAGNOSIS — C7800 Secondary malignant neoplasm of unspecified lung: Secondary | ICD-10-CM | POA: Diagnosis not present

## 2014-12-20 DIAGNOSIS — C641 Malignant neoplasm of right kidney, except renal pelvis: Secondary | ICD-10-CM | POA: Diagnosis not present

## 2014-12-20 DIAGNOSIS — Z95828 Presence of other vascular implants and grafts: Secondary | ICD-10-CM

## 2014-12-20 LAB — COMPREHENSIVE METABOLIC PANEL
ALT: 16 U/L (ref 0–55)
ANION GAP: 10 meq/L (ref 3–11)
AST: 20 U/L (ref 5–34)
Albumin: 3.5 g/dL (ref 3.5–5.0)
Alkaline Phosphatase: 76 U/L (ref 40–150)
BUN: 10.6 mg/dL (ref 7.0–26.0)
CALCIUM: 9.1 mg/dL (ref 8.4–10.4)
CHLORIDE: 103 meq/L (ref 98–109)
CO2: 26 mEq/L (ref 22–29)
CREATININE: 0.8 mg/dL (ref 0.6–1.1)
EGFR: 65 mL/min/{1.73_m2} — ABNORMAL LOW (ref >90)
Glucose: 94 mg/dl (ref 70–140)
Potassium: 3.4 mEq/L — ABNORMAL LOW (ref 3.5–5.1)
Sodium: 140 mEq/L (ref 136–145)
Total Bilirubin: 0.59 mg/dL (ref 0.20–1.20)
Total Protein: 6.1 g/dL — ABNORMAL LOW (ref 6.4–8.3)

## 2014-12-20 LAB — CBC WITH DIFFERENTIAL/PLATELET
BASO%: 0.4 % (ref 0.0–2.0)
BASOS ABS: 0 10*3/uL (ref 0.0–0.1)
EOS ABS: 0.1 10*3/uL (ref 0.0–0.5)
EOS%: 2.1 % (ref 0.0–7.0)
HCT: 39.8 % (ref 34.8–46.6)
HGB: 13.4 g/dL (ref 11.6–15.9)
LYMPH%: 12.7 % — AB (ref 14.0–49.7)
MCH: 35.9 pg — AB (ref 25.1–34.0)
MCHC: 33.6 g/dL (ref 31.5–36.0)
MCV: 106.8 fL — AB (ref 79.5–101.0)
MONO#: 0.4 10*3/uL (ref 0.1–0.9)
MONO%: 8.2 % (ref 0.0–14.0)
NEUT%: 76.6 % (ref 38.4–76.8)
NEUTROS ABS: 3.6 10*3/uL (ref 1.5–6.5)
PLATELETS: 218 10*3/uL (ref 145–400)
RBC: 3.73 10*6/uL (ref 3.70–5.45)
RDW: 15.8 % — ABNORMAL HIGH (ref 11.2–14.5)
WBC: 4.7 10*3/uL (ref 3.9–10.3)
lymph#: 0.6 10*3/uL — ABNORMAL LOW (ref 0.9–3.3)

## 2014-12-20 LAB — LACTATE DEHYDROGENASE: LDH: 220 U/L (ref 125–245)

## 2014-12-20 MED ORDER — HEPARIN SOD (PORK) LOCK FLUSH 100 UNIT/ML IV SOLN
500.0000 [IU] | Freq: Once | INTRAVENOUS | Status: AC
  Administered 2014-12-20: 500 [IU] via INTRAVENOUS
  Filled 2014-12-20: qty 5

## 2014-12-20 MED ORDER — SODIUM CHLORIDE 0.9 % IJ SOLN
10.0000 mL | INTRAMUSCULAR | Status: DC | PRN
  Administered 2014-12-20: 10 mL via INTRAVENOUS
  Filled 2014-12-20: qty 10

## 2014-12-20 NOTE — Telephone Encounter (Signed)
Gave and printed appt sched and avs fo rpt for Feb 2017 °

## 2014-12-20 NOTE — Progress Notes (Signed)
[facility] [phone]   [phone]  OFFICE PROGRESS NOTE  [name], MD [address] [place] [address]  DIAGNOSIS:  #1 non-Hodgkin's lymphoma diagnosed on bone marrow biopsy in June of 2012  #2 metastatic renal cell carcinoma initially diagnosed as stage III (T3a N0M0) renal cell carcinoma diagnosed in June of 2012 with pulmonary metastasis diagnosed in August of 2014.  PRIOR THERAPY:  1) Status post right nephrectomy under the care of Dr. Diona Fanti on 09/02/2010.  2) Systemic chemotherapy with CHOP/Rituxan given every 3 weeks, status post 6 cycles.   CURRENT THERAPY: Votrient 800 mg by mouth daily. Status post 26 months of treatment.  INTERVAL HISTORY: Melissa Cross 81 y.o. female returns to the clinic today for two-month followup visit accompanied by her son, [name]. The patient is feeling fine today with no specific complaints. She is tolerating her treatment with Votrient fairly well with no significant adverse effects. She denied having any significant weight loss or night sweats. The patient denied having any chest pain, shortness of breath, cough or hemoptysis. She has no nausea or vomiting. The patient denied having any fever or chills. She had repeat CBC, complaints metabolic panel and LDH performed earlier today and she is here for evaluation and discussion of her lab results.  MEDICAL HISTORY: Past Medical History  Diagnosis Date  . Hypercholesteremia     takes Zocor daily  . Glaucoma   . Anemia   . Stroke ([place])     hx of TIA  . Blood transfusion   . Thrush 12/17/2010  . Hypertension     takes Nadolol daily  . Hypothyroidism     takes Synthroid daily  . Hypothyroid   . PONV (postoperative nausea and vomiting)   . Shortness of breath     with exertion  . Headache(784.0)     hx of migraines  . TIA (transient ischemic attack)   . Peripheral neuropathy ([place])   . Arthritis     knee /hip  .  Scoliosis   . History of bladder infections   . History of blood transfusion   . Cataract     right  . History of shingles   . Renal cell carcinoma   . Non Hodgkin's lymphoma ([place])   . Cancer ([place])     kidney cancer, NHL   . Cancer [facility])     last chemo 11/04/10     ALLERGIES:  is allergic to crab and dilaudid.  MEDICATIONS:  Current Outpatient Prescriptions  Medication Sig Dispense Refill  . amLODipine (NORVASC) 5 MG tablet Take 1 tablet (5 mg total) by mouth daily. 30 tablet 0  . aspirin EC 81 MG tablet Take 81 mg by mouth at bedtime.     . Calcium Citrate (CITRACAL PO) Take 1 tablet by mouth 3 (three) times a week.     . Calcium-Magnesium-Vitamin D (CITRACAL CALCIUM+D) 600-40-500 MG-MG-UNIT TB24 Take 630 mg by mouth.    . cholecalciferol (VITAMIN D) 400 UNITS TABS Take 400 Units by mouth daily.      [name] FLUZONE HIGH-DOSE 0.5 ML SUSY ADM 0.5ML IM UTD  0  . levothyroxine (SYNTHROID, LEVOTHROID) 50 MCG tablet Take 50 mcg by mouth every morning.     . Multiple Vitamin (MULITIVITAMIN WITH MINERALS) TABS Take 1 tablet by mouth daily.      . nadolol (CORGARD) 40 MG tablet Take 40 mg by mouth at bedtime.     [name]  pazopanib (VOTRIENT) 200 MG tablet Take 4 tablets (800 mg total) by mouth daily. Take on an empty stomach. 120 tablet 11  . prednisoLONE acetate (PRED FORTE) 1 % ophthalmic suspension 1 drop.    . timolol (BETIMOL) 0.5 % ophthalmic solution Place 1 drop into the left eye 2 (two) times daily.    . timolol (TIMOPTIC) 0.5 % ophthalmic solution 1 drop.  10  . UNABLE TO FIND Walker  Use with ambulation 1 each 1  . vitamin B-12 (CYANOCOBALAMIN) 1000 MCG tablet Place 1,000 mcg under the tongue daily.      No current facility-administered medications for this visit.   Facility-Administered Medications Ordered in Other Visits  Medication Dose Route Frequency Provider Last Rate Last Dose  . sodium chloride 0.9 % injection 10 mL  10 mL Intravenous PRN Curt Bears, MD   10 mL at  05/17/12 0846    SURGICAL HISTORY:  Past Surgical History  Procedure Laterality Date  . Nephrectomy      Right Side  . Portacath placement      right subclavian  . Tonsillectomy      as a child  . Mouth surgery    . Left cataract removed    . Video bronchoscopy with endobronchial ultrasound N/A 08/06/2012    Procedure: VIDEO BRONCHOSCOPY WITH ENDOBRONCHIAL ULTRASOUND;  Surgeon: Collene Gobble, MD;  Location: [facility];  Service: Thoracic;  Laterality: N/A;  . Video bronchoscopy with endobronchial navigation N/A 08/06/2012    Procedure: VIDEO BRONCHOSCOPY WITH ENDOBRONCHIAL NAVIGATION;  Surgeon: Collene Gobble, MD;  Location: [facility];  Service: Thoracic;  Laterality: N/A;    REVIEW OF SYSTEMS:  A comprehensive review of systems was negative.   PHYSICAL EXAMINATION: General appearance: alert, cooperative, fatigued and no distress Head: Normocephalic, without obvious abnormality, atraumatic Neck: no adenopathy Lymph nodes: Cervical, supraclavicular, and axillary nodes normal. Resp: clear to auscultation bilaterally Cardio: regular rate and rhythm, S1, S2 normal, no murmur, click, rub or gallop GI: soft, non-tender; bowel sounds normal; no masses,  no organomegaly Extremities: extremities normal, atraumatic, no cyanosis or edema Neurologic: Alert and oriented X 3, normal strength and tone. Normal symmetric reflexes. Normal coordination and gait  ECOG PERFORMANCE STATUS: 1 - Symptomatic but completely ambulatory  Blood pressure 159/78, pulse 56, temperature 98 F (36.7 C), temperature source Oral, resp. rate 17, height '5\' 3"'$  (1.6 m), weight 116 lb 4.8 oz (52.753 kg), SpO2 98 %.  LABORATORY DATA: Lab Results  Component Value Date   WBC 4.7 12/20/2014   HGB 13.4 12/20/2014   HCT 39.8 12/20/2014   MCV 106.8* 12/20/2014   PLT 218 12/20/2014      Chemistry      Component Value Date/Time   NA 140 10/25/2014 0818   NA 136* 06/12/2013 0503   K 4.0 10/25/2014 0818   K 3.4* 06/12/2013  0503   CL 98 06/12/2013 0503   CL 102 04/01/2012 0805   CO2 28 10/25/2014 0818   CO2 25 06/12/2013 0503   BUN 13.1 10/25/2014 0818   BUN 5* 06/12/2013 0503   CREATININE 0.8 10/25/2014 0818   CREATININE 0.79 06/12/2013 0503      Component Value Date/Time   CALCIUM 9.7 10/25/2014 0818   CALCIUM 8.9 06/12/2013 0503   ALKPHOS 86 10/25/2014 0818   ALKPHOS 94 06/09/2013 2230   AST 21 10/25/2014 0818   AST 23 06/09/2013 2230   ALT 16 10/25/2014 0818   ALT 17 06/09/2013 2230   BILITOT 0.67 10/25/2014  0818   BILITOT 0.9 06/09/2013 2230       RADIOGRAPHIC STUDIES: No results found. ASSESSMENT AND PLAN: This is a very pleasant 81 years old white female with metastatic renal cell carcinoma.  She is currently on treatment with Votrient 800 mg by mouth daily status post 24 months of treatment and tolerating it fairly well.  The patient is feeling fine today with no specific complaints. I recommended for her to continue her current treatment with Votrient 800 mg by mouth daily. She will come back for follow-up visit in 2 months for reevaluation after repeating CBC, comprehensive metabolic panel and LDH as well as CT scan of the chest, abdomen and pelvis without contrast. She will also has Port-A-Cath flush at that time.  She was advised to call immediately if she has any concerning symptoms in the interval. The patient voices understanding of current disease status and treatment options and is in agreement with the current care plan.  All questions were answered. The patient knows to call the clinic with any problems, questions or concerns. We can certainly see the patient much sooner if necessary.  Disclaimer: This note was dictated with voice recognition software. Similar sounding words can inadvertently be transcribed and may not be corrected upon review.

## 2014-12-20 NOTE — Patient Instructions (Signed)

## 2014-12-27 ENCOUNTER — Encounter: Payer: Self-pay | Admitting: Medical Oncology

## 2014-12-27 NOTE — Progress Notes (Signed)
[employer] pt assistance application for [phone] placed in [facility] in basket.

## 2014-12-28 DIAGNOSIS — Z79899 Other long term (current) drug therapy: Secondary | ICD-10-CM | POA: Diagnosis not present

## 2014-12-28 DIAGNOSIS — Z905 Acquired absence of kidney: Secondary | ICD-10-CM | POA: Diagnosis not present

## 2014-12-28 DIAGNOSIS — Z85528 Personal history of other malignant neoplasm of kidney: Secondary | ICD-10-CM | POA: Diagnosis not present

## 2014-12-28 DIAGNOSIS — H1851 Endothelial corneal dystrophy: Secondary | ICD-10-CM | POA: Diagnosis not present

## 2014-12-28 DIAGNOSIS — Z9841 Cataract extraction status, right eye: Secondary | ICD-10-CM | POA: Diagnosis not present

## 2014-12-28 DIAGNOSIS — Z87891 Personal history of nicotine dependence: Secondary | ICD-10-CM | POA: Diagnosis not present

## 2014-12-28 DIAGNOSIS — Z9842 Cataract extraction status, left eye: Secondary | ICD-10-CM | POA: Diagnosis not present

## 2014-12-28 DIAGNOSIS — Z8673 Personal history of transient ischemic attack (TIA), and cerebral infarction without residual deficits: Secondary | ICD-10-CM | POA: Diagnosis not present

## 2014-12-28 DIAGNOSIS — Z83511 Family history of glaucoma: Secondary | ICD-10-CM | POA: Diagnosis not present

## 2014-12-28 DIAGNOSIS — Z85118 Personal history of other malignant neoplasm of bronchus and lung: Secondary | ICD-10-CM | POA: Diagnosis not present

## 2014-12-28 DIAGNOSIS — Z885 Allergy status to narcotic agent status: Secondary | ICD-10-CM | POA: Diagnosis not present

## 2014-12-28 DIAGNOSIS — Z9221 Personal history of antineoplastic chemotherapy: Secondary | ICD-10-CM | POA: Diagnosis not present

## 2014-12-28 DIAGNOSIS — E039 Hypothyroidism, unspecified: Secondary | ICD-10-CM | POA: Diagnosis not present

## 2014-12-28 DIAGNOSIS — Z8572 Personal history of non-Hodgkin lymphomas: Secondary | ICD-10-CM | POA: Diagnosis not present

## 2014-12-28 DIAGNOSIS — I10 Essential (primary) hypertension: Secondary | ICD-10-CM | POA: Diagnosis not present

## 2014-12-28 DIAGNOSIS — Z961 Presence of intraocular lens: Secondary | ICD-10-CM | POA: Diagnosis not present

## 2014-12-28 DIAGNOSIS — Z7982 Long term (current) use of aspirin: Secondary | ICD-10-CM | POA: Diagnosis not present

## 2014-12-28 DIAGNOSIS — Z91013 Allergy to seafood: Secondary | ICD-10-CM | POA: Diagnosis not present

## 2014-12-28 DIAGNOSIS — H3530 Unspecified macular degeneration: Secondary | ICD-10-CM | POA: Diagnosis not present

## 2014-12-28 DIAGNOSIS — H18231 Secondary corneal edema, right eye: Secondary | ICD-10-CM | POA: Diagnosis not present

## 2014-12-28 DIAGNOSIS — H3581 Retinal edema: Secondary | ICD-10-CM | POA: Diagnosis not present

## 2014-12-28 DIAGNOSIS — G629 Polyneuropathy, unspecified: Secondary | ICD-10-CM | POA: Diagnosis not present

## 2014-12-28 DIAGNOSIS — T86841 Corneal transplant failure: Secondary | ICD-10-CM | POA: Diagnosis not present

## 2015-01-16 ENCOUNTER — Other Ambulatory Visit: Payer: Self-pay | Admitting: *Deleted

## 2015-01-16 DIAGNOSIS — C641 Malignant neoplasm of right kidney, except renal pelvis: Secondary | ICD-10-CM

## 2015-01-16 MED ORDER — PAZOPANIB HCL 200 MG PO TABS: 800.0000 mg | ORAL_TABLET | Freq: Every day | ORAL | Status: DC

## 2015-01-16 NOTE — Telephone Encounter (Signed)
Called pt to get additional information. [employer],  [phone]  Call placed to [employer] after 24mns on hold a representative advised they are currently working on Dec 30th Faxes that have been sent over.  This specific medication will need to be called into the specialty pharmacy. Unable to e-scribe as they do not have that service. Placed on hold for an additional 468ms.Call disconnected.  Rx will be faxed to Fax# [phone]

## 2015-01-16 NOTE — Telephone Encounter (Signed)
TC from patient stating that a refill for her Votrient needs to be called in to [facility]. She was supposed to have delivery today. She called [employer] and they said they needed a refill order but had not notified Dr. Julien Nordmann.  [employer] needs it called in not fax'd because they are very behind in their faxes.

## 2015-01-17 NOTE — Telephone Encounter (Signed)
Late Entry 01/16/15  4:45pm Called [employer] for the 2nd time, after being on hold an additional 30 min, spoke with pharmacist VO for Votrient, requested this order be filled STAT with overnight delivery. Pharmacist advised they are behind on filling medications due to the holidays and will try to expedite this order. Pt will be called by [employer] to confirm delivery. Pt notified.

## 2015-01-19 ENCOUNTER — Telehealth: Payer: Self-pay | Admitting: *Deleted

## 2015-01-19 NOTE — Telephone Encounter (Signed)
Pt called [facility] -to advise she has not heard from [employer] regarding her Refill.

## 2015-01-23 DIAGNOSIS — E039 Hypothyroidism, unspecified: Secondary | ICD-10-CM | POA: Diagnosis not present

## 2015-01-23 DIAGNOSIS — E559 Vitamin D deficiency, unspecified: Secondary | ICD-10-CM | POA: Diagnosis not present

## 2015-01-23 DIAGNOSIS — I712 Thoracic aortic aneurysm, without rupture: Secondary | ICD-10-CM | POA: Diagnosis not present

## 2015-01-23 DIAGNOSIS — D649 Anemia, unspecified: Secondary | ICD-10-CM | POA: Diagnosis not present

## 2015-01-23 DIAGNOSIS — D709 Neutropenia, unspecified: Secondary | ICD-10-CM | POA: Diagnosis not present

## 2015-01-23 DIAGNOSIS — N183 Chronic kidney disease, stage 3 (moderate): Secondary | ICD-10-CM | POA: Diagnosis not present

## 2015-01-23 DIAGNOSIS — I358 Other nonrheumatic aortic valve disorders: Secondary | ICD-10-CM | POA: Diagnosis not present

## 2015-01-23 DIAGNOSIS — E46 Unspecified protein-calorie malnutrition: Secondary | ICD-10-CM | POA: Diagnosis not present

## 2015-01-23 DIAGNOSIS — M899 Disorder of bone, unspecified: Secondary | ICD-10-CM | POA: Diagnosis not present

## 2015-01-23 DIAGNOSIS — I10 Essential (primary) hypertension: Secondary | ICD-10-CM | POA: Diagnosis not present

## 2015-01-23 DIAGNOSIS — E785 Hyperlipidemia, unspecified: Secondary | ICD-10-CM | POA: Diagnosis not present

## 2015-01-23 DIAGNOSIS — Z905 Acquired absence of kidney: Secondary | ICD-10-CM | POA: Diagnosis not present

## 2015-01-24 ENCOUNTER — Other Ambulatory Visit: Payer: Self-pay | Admitting: Medical Oncology

## 2015-01-24 ENCOUNTER — Telehealth: Payer: Self-pay | Admitting: *Deleted

## 2015-01-24 DIAGNOSIS — C641 Malignant neoplasm of right kidney, except renal pelvis: Secondary | ICD-10-CM

## 2015-01-24 NOTE — Telephone Encounter (Signed)
Pt called- states she has not received her votrient. Last Rx in computer is 01/16/15- she will call [employer] to follow up as well.

## 2015-01-24 NOTE — Progress Notes (Signed)
Pt called [employer] and they will mail her Votrient and all refills

## 2015-02-09 ENCOUNTER — Telehealth: Payer: Self-pay | Admitting: *Deleted

## 2015-02-09 ENCOUNTER — Encounter: Payer: Self-pay | Admitting: Internal Medicine

## 2015-02-09 NOTE — Telephone Encounter (Signed)
Call from Triage RN, Pt called triage regarding upcoming appts.MD appt is scheduled prior to CT.  POF to scheduling to r/s MD appt after CT on 2/8

## 2015-02-09 NOTE — Progress Notes (Signed)
See prev notes about votrient. She has asst wth [employer]???

## 2015-02-13 ENCOUNTER — Telehealth: Payer: Self-pay | Admitting: *Deleted

## 2015-02-13 ENCOUNTER — Telehealth: Payer: Self-pay | Admitting: Internal Medicine

## 2015-02-13 NOTE — Telephone Encounter (Signed)
Patient returned call and cannot come for f/u 2/9 - per patient appointment moved to 2/22 @ 1:30 pm. Patient has new date/time. Lab/flush/ct remain 2/8.

## 2015-02-13 NOTE — Telephone Encounter (Signed)
Moved 2/8 f/u to 2/9. Per 2/3 pof r/s 2/8 f/u due to ct 2/8. F/u r/s to 2/9 - no other upcoming availability - patient myactive and called stating she cannot come 2/9. Returned call to patient and left message re next available being a few weeks away and checking to see if she would rather keep 2/9. Left patient my direct number and asked that she call me back re appointment.

## 2015-02-13 NOTE — Telephone Encounter (Signed)
"  I've called twice since last week to reschedule F/U.  Appointment with Dr. Julien Nordmann is same day as scans and should be a week later.  Can you check on this?"  Per appointment calender F/U with Dr. Julien Nordmann is 02-15-2015 at 10:15.  New appointment information provided with this call.

## 2015-02-14 ENCOUNTER — Ambulatory Visit (HOSPITAL_BASED_OUTPATIENT_CLINIC_OR_DEPARTMENT_OTHER): Payer: Medicare Other

## 2015-02-14 ENCOUNTER — Other Ambulatory Visit (HOSPITAL_BASED_OUTPATIENT_CLINIC_OR_DEPARTMENT_OTHER): Payer: Medicare Other

## 2015-02-14 ENCOUNTER — Encounter (HOSPITAL_COMMUNITY): Payer: Self-pay

## 2015-02-14 ENCOUNTER — Ambulatory Visit (HOSPITAL_COMMUNITY)
Admission: RE | Admit: 2015-02-14 | Discharge: 2015-02-14 | Disposition: A | Discharge status: Home / Self Care | Payer: Medicare Other | Source: Ambulatory Visit | Attending: Internal Medicine | Admitting: Internal Medicine

## 2015-02-14 ENCOUNTER — Ambulatory Visit: Payer: Medicare Other | Admitting: Internal Medicine

## 2015-02-14 DIAGNOSIS — I700 Atherosclerosis of aorta: Secondary | ICD-10-CM | POA: Diagnosis not present

## 2015-02-14 DIAGNOSIS — C641 Malignant neoplasm of right kidney, except renal pelvis: Secondary | ICD-10-CM

## 2015-02-14 DIAGNOSIS — C8330 Diffuse large B-cell lymphoma, unspecified site: Secondary | ICD-10-CM

## 2015-02-14 DIAGNOSIS — C8590 Non-Hodgkin lymphoma, unspecified, unspecified site: Secondary | ICD-10-CM | POA: Diagnosis not present

## 2015-02-14 DIAGNOSIS — I712 Thoracic aortic aneurysm, without rupture: Secondary | ICD-10-CM | POA: Insufficient documentation

## 2015-02-14 DIAGNOSIS — Z95828 Presence of other vascular implants and grafts: Secondary | ICD-10-CM

## 2015-02-14 DIAGNOSIS — C649 Malignant neoplasm of unspecified kidney, except renal pelvis: Secondary | ICD-10-CM | POA: Diagnosis not present

## 2015-02-14 LAB — CBC WITH DIFFERENTIAL/PLATELET
BASO%: 0.3 % (ref 0.0–2.0)
BASOS ABS: 0 10*3/uL (ref 0.0–0.1)
EOS%: 2.3 % (ref 0.0–7.0)
Eosinophils Absolute: 0.1 10*3/uL (ref 0.0–0.5)
HCT: 38 % (ref 34.8–46.6)
HEMOGLOBIN: 12.7 g/dL (ref 11.6–15.9)
LYMPH%: 16.8 % (ref 14.0–49.7)
MCH: 35.3 pg — ABNORMAL HIGH (ref 25.1–34.0)
MCHC: 33.4 g/dL (ref 31.5–36.0)
MCV: 105.6 fL — AB (ref 79.5–101.0)
MONO#: 0.3 10*3/uL (ref 0.1–0.9)
MONO%: 7.9 % (ref 0.0–14.0)
NEUT%: 72.7 % (ref 38.4–76.8)
NEUTROS ABS: 2.9 10*3/uL (ref 1.5–6.5)
Platelets: 247 10*3/uL (ref 145–400)
RBC: 3.6 10*6/uL — AB (ref 3.70–5.45)
RDW: 14.7 % — AB (ref 11.2–14.5)
WBC: 3.9 10*3/uL (ref 3.9–10.3)
lymph#: 0.7 10*3/uL — ABNORMAL LOW (ref 0.9–3.3)

## 2015-02-14 LAB — COMPREHENSIVE METABOLIC PANEL
ALBUMIN: 3.3 g/dL — AB (ref 3.5–5.0)
ALK PHOS: 78 U/L (ref 40–150)
ALT: 16 U/L (ref 0–55)
AST: 20 U/L (ref 5–34)
Anion Gap: 12 mEq/L — ABNORMAL HIGH (ref 3–11)
BILIRUBIN TOTAL: 0.55 mg/dL (ref 0.20–1.20)
BUN: 12.2 mg/dL (ref 7.0–26.0)
CO2: 26 meq/L (ref 22–29)
CREATININE: 0.8 mg/dL (ref 0.6–1.1)
Calcium: 9.4 mg/dL (ref 8.4–10.4)
Chloride: 100 mEq/L (ref 98–109)
EGFR: 62 mL/min/{1.73_m2} — ABNORMAL LOW (ref >90)
GLUCOSE: 87 mg/dL (ref 70–140)
Potassium: 3.9 mEq/L (ref 3.5–5.1)
SODIUM: 138 meq/L (ref 136–145)
TOTAL PROTEIN: 6.1 g/dL — AB (ref 6.4–8.3)

## 2015-02-14 LAB — LACTATE DEHYDROGENASE: LDH: 217 U/L (ref 125–245)

## 2015-02-14 MED ORDER — SODIUM CHLORIDE 0.9% FLUSH
10.0000 mL | INTRAVENOUS | Status: DC | PRN
  Administered 2015-02-14: 10 mL via INTRAVENOUS
  Filled 2015-02-14: qty 10

## 2015-02-14 MED ORDER — HEPARIN SOD (PORK) LOCK FLUSH 100 UNIT/ML IV SOLN
500.0000 [IU] | Freq: Once | INTRAVENOUS | Status: AC
  Administered 2015-02-14: 500 [IU] via INTRAVENOUS
  Filled 2015-02-14: qty 5

## 2015-02-14 NOTE — Patient Instructions (Signed)

## 2015-02-15 ENCOUNTER — Ambulatory Visit: Payer: Medicare Other | Admitting: Internal Medicine

## 2015-02-19 ENCOUNTER — Telehealth: Payer: Self-pay | Admitting: *Deleted

## 2015-02-19 NOTE — Telephone Encounter (Signed)
Call received in [place] from pt stating she has an application for patient assistance from [employer].  She has completed form and noted areas that " need to be completed by Dr Julien Nordmann - and the cover letter states a form was sent to his office ".  " how do I find out if he has completed his part - and do I need to bring my form to your office to be sent back for assistance ?"  This RN informed pt above concern is processed by our Managed Care department.  Her inquiry will be forwarded to the Managed Care Dept. Per call pt understands she should receive a call back from them to direct her appropriately regarding patient assistance form.  Return phone number given per call as [phone].

## 2015-02-20 ENCOUNTER — Encounter: Payer: Self-pay | Admitting: Internal Medicine

## 2015-02-20 ENCOUNTER — Other Ambulatory Visit: Payer: Self-pay | Admitting: Medical Oncology

## 2015-02-20 DIAGNOSIS — C641 Malignant neoplasm of right kidney, except renal pelvis: Secondary | ICD-10-CM

## 2015-02-20 MED ORDER — PAZOPANIB HCL 200 MG PO TABS: 800.0000 mg | ORAL_TABLET | Freq: Every day | ORAL | Status: DC

## 2015-02-20 NOTE — Progress Notes (Signed)
rx for votrient placed in raquels box

## 2015-02-20 NOTE — Progress Notes (Signed)
Received call from pt regarding renewing assistance with [employer] for [place].  I looked in my files and didn't have anything for her.  I reached out to Raquel to see if she had anything and she didn't but stated she can go on [employer] and pull her account and work on it for her.  She will reach out to the pt.  I also gave pt Raquel's contact info to follow up with her.

## 2015-02-22 ENCOUNTER — Encounter: Payer: Self-pay | Admitting: Internal Medicine

## 2015-02-22 NOTE — Progress Notes (Signed)
Patient dropped off [employer] app. I called and spoke with [facility] with [employer] to see if they recd info I faxed on 02/20/15, yes and they have approved her for votrient asst. She told him to fax over the app with [employer] to them.

## 2015-02-22 NOTE — Progress Notes (Signed)
I recd votrient app for [employer] from the patient. I had already sent info to [employer] to look into asst. On 02/20/15, I will ck status with [employer] to see if approved.

## 2015-02-23 ENCOUNTER — Encounter: Payer: Self-pay | Admitting: Internal Medicine

## 2015-02-23 NOTE — Progress Notes (Signed)
Placed [employer] form for dr. Julien Nordmann to sign. Per [employer] it must go thru them. I called and let the patient know I will fax once he signs

## 2015-02-28 ENCOUNTER — Encounter: Payer: Self-pay | Admitting: Internal Medicine

## 2015-02-28 ENCOUNTER — Ambulatory Visit (HOSPITAL_BASED_OUTPATIENT_CLINIC_OR_DEPARTMENT_OTHER): Payer: Medicare Other | Admitting: Internal Medicine

## 2015-02-28 ENCOUNTER — Telehealth: Payer: Self-pay | Admitting: Internal Medicine

## 2015-02-28 VITALS — BP 155/76 | HR 63 | Temp 98.2°F | Resp 17 | Ht 63.0 in | Wt 116.1 lb

## 2015-02-28 DIAGNOSIS — C7800 Secondary malignant neoplasm of unspecified lung: Secondary | ICD-10-CM | POA: Diagnosis not present

## 2015-02-28 DIAGNOSIS — C649 Malignant neoplasm of unspecified kidney, except renal pelvis: Secondary | ICD-10-CM

## 2015-02-28 DIAGNOSIS — C8330 Diffuse large B-cell lymphoma, unspecified site: Secondary | ICD-10-CM

## 2015-02-28 DIAGNOSIS — C641 Malignant neoplasm of right kidney, except renal pelvis: Secondary | ICD-10-CM

## 2015-02-28 DIAGNOSIS — Z5111 Encounter for antineoplastic chemotherapy: Secondary | ICD-10-CM

## 2015-02-28 NOTE — Progress Notes (Signed)
[place] [phone]) [phone]   Fax:([phone]) [phone]  OFFICE PROGRESS NOTE  [name], MD [address] [place] [address]  DIAGNOSIS:  #1 non-Hodgkin's lymphoma diagnosed on bone marrow biopsy in June of 2012  #2 metastatic renal cell carcinoma initially diagnosed as stage III (T3a N0M0) renal cell carcinoma diagnosed in June of 2012 with pulmonary metastasis diagnosed in August of 2014.  PRIOR THERAPY:  1) Status post right nephrectomy under the care of Dr. Diona Fanti on 09/02/2010.  2) Systemic chemotherapy with CHOP/Rituxan given every 3 weeks, status post 6 cycles.   CURRENT THERAPY: Votrient 800 mg by mouth daily. Status post 26 months of treatment.  INTERVAL HISTORY: Melissa Cross 82 y.o. female returns to the clinic today for two-month followup visit accompanied by her son, Melissa Cross. The patient is feeling fine today with no specific complaints except for weakness in the lower extremities. She is tolerating her treatment with Votrient fairly well with no significant adverse effects. She denied having any significant weight loss or night sweats. The patient denied having any chest pain, shortness of breath, cough or hemoptysis. She has no nausea or vomiting. The patient denied having any fever or chills. She had repeat CBC, comprehensivemetabolic panel and LDH  As well as CT scan of the chest, abdomen and pelvis performed recently and she is here for evaluation and discussion of her lab and the scanresults.  MEDICAL HISTORY: Past Medical History  Diagnosis Date  . Hypercholesteremia     takes Zocor daily  . Glaucoma   . Anemia   . Stroke ([place])     hx of TIA  . Blood transfusion   . Thrush 12/17/2010  . Hypertension     takes Nadolol daily  . Hypothyroidism     takes Synthroid daily  . Hypothyroid   . PONV (postoperative nausea and vomiting)   . Shortness of breath     with exertion  . Headache(784.0)     hx of  migraines  . TIA (transient ischemic attack)   . Peripheral neuropathy ([place])   . Arthritis     knee /hip  . Scoliosis   . History of bladder infections   . History of blood transfusion   . Cataract     right  . History of shingles   . Renal cell carcinoma   . Non Hodgkin's lymphoma ([place])   . Cancer ([place])     kidney cancer, NHL   . Cancer [facility])     last chemo 11/04/10     ALLERGIES:  is allergic to crab and dilaudid.  MEDICATIONS:  Current Outpatient Prescriptions  Medication Sig Dispense Refill  . amLODipine (NORVASC) 5 MG tablet Take 1 tablet (5 mg total) by mouth daily. 30 tablet 0  . aspirin EC 81 MG tablet Take 81 mg by mouth at bedtime.     . Calcium Citrate (CITRACAL PO) Take 1 tablet by mouth 3 (three) times a week.     . Calcium-Magnesium-Vitamin D (CITRACAL CALCIUM+D) 600-40-500 MG-MG-UNIT TB24 Take 630 mg by mouth.    . cholecalciferol (VITAMIN D) 400 UNITS TABS Take 400 Units by mouth daily.      [name] FLUZONE HIGH-DOSE 0.5 ML SUSY ADM 0.5ML IM UTD  0  . levothyroxine (SYNTHROID, LEVOTHROID) 50 MCG tablet Take 50 mcg by mouth every morning.     . Multiple Vitamin (MULITIVITAMIN WITH MINERALS) TABS Take 1 tablet by mouth daily.      [name]  nadolol (CORGARD) 40 MG tablet Take 40 mg by mouth at bedtime.     . pazopanib (VOTRIENT) 200 MG tablet Take 4 tablets (800 mg total) by mouth daily. Take on an empty stomach. 120 tablet 11  . prednisoLONE acetate (PRED FORTE) 1 % ophthalmic suspension 1 drop.    . timolol (BETIMOL) 0.5 % ophthalmic solution Place 1 drop into the left eye 2 (two) times daily.    . timolol (TIMOPTIC) 0.5 % ophthalmic solution 1 drop.  10  . UNABLE TO FIND Walker  Use with ambulation 1 each 1  . vitamin B-12 (CYANOCOBALAMIN) 1000 MCG tablet Place 1,000 mcg under the tongue daily.      No current facility-administered medications for this visit.   Facility-Administered Medications Ordered in Other Visits  Medication Dose Route Frequency Provider Last Rate  Last Dose  . sodium chloride 0.9 % injection 10 mL  10 mL Intravenous PRN Curt Bears, MD   10 mL at 05/17/12 0846    SURGICAL HISTORY:  Past Surgical History  Procedure Laterality Date  . Nephrectomy      Right Side  . Portacath placement      right subclavian  . Tonsillectomy      as a child  . Mouth surgery    . Left cataract removed    . Video bronchoscopy with endobronchial ultrasound N/A 08/06/2012    Procedure: VIDEO BRONCHOSCOPY WITH ENDOBRONCHIAL ULTRASOUND;  Surgeon: Collene Gobble, MD;  Location: [facility];  Service: Thoracic;  Laterality: N/A;  . Video bronchoscopy with endobronchial navigation N/A 08/06/2012    Procedure: VIDEO BRONCHOSCOPY WITH ENDOBRONCHIAL NAVIGATION;  Surgeon: Collene Gobble, MD;  Location: [place];  Service: Thoracic;  Laterality: N/A;    REVIEW OF SYSTEMS:  Constitutional: positive for fatigue Eyes: negative Ears, nose, mouth, throat, and face: negative Respiratory: negative Cardiovascular: negative Gastrointestinal: negative Genitourinary:negative Integument/breast: negative Hematologic/lymphatic: negative Musculoskeletal:positive for muscle weakness Neurological: negative Behavioral/Psych: negative Endocrine: negative Allergic/Immunologic: negative   PHYSICAL EXAMINATION: General appearance: alert, cooperative, fatigued and no distress Head: Normocephalic, without obvious abnormality, atraumatic Neck: no adenopathy Lymph nodes: Cervical, supraclavicular, and axillary nodes normal. Resp: clear to auscultation bilaterally Cardio: regular rate and rhythm, S1, S2 normal, no murmur, click, rub or gallop GI: soft, non-tender; bowel sounds normal; no masses,  no organomegaly Extremities: extremities normal, atraumatic, no cyanosis or edema Neurologic: Alert and oriented X 3, normal strength and tone. Normal symmetric reflexes. Normal coordination and gait  ECOG PERFORMANCE STATUS: 1 - Symptomatic but completely ambulatory  Blood pressure  155/76, pulse 63, temperature 98.2 F (36.8 C), temperature source Oral, resp. rate 17, height 5' 3" (1.6 m), weight 116 lb 1.6 oz (52.663 kg), SpO2 97 %.  LABORATORY DATA: Lab Results  Component Value Date   WBC 3.9 02/14/2015   HGB 12.7 02/14/2015   HCT 38.0 02/14/2015   MCV 105.6* 02/14/2015   PLT 247 02/14/2015      Chemistry      Component Value Date/Time   NA 138 02/14/2015 0840   NA 136* 06/12/2013 0503   K 3.9 02/14/2015 0840   K 3.4* 06/12/2013 0503   CL 98 06/12/2013 0503   CL 102 04/01/2012 0805   CO2 26 02/14/2015 0840   CO2 25 06/12/2013 0503   BUN 12.2 02/14/2015 0840   BUN 5* 06/12/2013 0503   CREATININE 0.8 02/14/2015 0840   CREATININE 0.79 06/12/2013 0503      Component Value Date/Time   CALCIUM 9.4 02/14/2015 0840  CALCIUM 8.9 06/12/2013 0503   ALKPHOS 78 02/14/2015 0840   ALKPHOS 94 06/09/2013 2230   AST 20 02/14/2015 0840   AST 23 06/09/2013 2230   ALT 16 02/14/2015 0840   ALT 17 06/09/2013 2230   BILITOT 0.55 02/14/2015 0840   BILITOT 0.9 06/09/2013 2230       RADIOGRAPHIC STUDIES: Ct Abdomen Pelvis Wo Contrast  02/14/2015  CLINICAL DATA:  Followup non-Hodgkin's lymphoma and metastatic renal cell carcinoma. EXAM: CT CHEST, ABDOMEN AND PELVIS WITHOUT CONTRAST TECHNIQUE: Multidetector CT imaging of the chest, abdomen and pelvis was performed following the standard protocol without IV contrast. COMPARISON:  08/25/2014 FINDINGS: CT CHEST Mediastinum: The heart size is normal. No pericardial effusion identified. The ascending thoracic aorta measures 4.1 cm in diameter. There is aortic atherosclerosis. Calcification within the LAD and left circumflex coronary artery noted. No mediastinal or hilar adenopathy identified. No axillary or supraclavicular adenopathy. Lungs/Pleura: No pleural fluid identified. Scar like density is identified within the right middle lobe and appears unchanged from previous exam. Musculoskeletal: The bones appear osteopenic. There  is no aggressive lytic or sclerotic bone lesions identified. CT ABDOMEN AND PELVIS Hepatobiliary: Cyst in the right lobe of liver measures 1.4 cm, image 58 of series 2. Unchanged from previous exam. The gallbladder is unremarkable. No biliary dilatation. Pancreas: Normal appearance of the pancreas. Spleen: Negative Adrenals/Urinary Tract: The adrenal glands appear normal. Previous right nephrectomy. Normal appearance of the left kidney. The urinary bladder is negative. Stomach/Bowel: The stomach is within normal limits. The small bowel loops have a normal course and caliber. No obstruction. Numerous colonic diverticula noted. No pathologic dilatation of the large bowel loops. Vascular/Lymphatic: Calcified atherosclerotic disease involves the abdominal aorta. No aneurysm. No enlarged retroperitoneal or mesenteric adenopathy. No enlarged pelvic or inguinal lymph nodes. Reproductive: The uterus appears normal. Bilobed cystic structure within the left adnexa measures 3.2 cm, image 86 of series 2. Previously this measured the same. Other: There is no ascites or focal fluid collections within the abdomen or pelvis. Musculoskeletal: There is a scoliosis deformity identified. This is convex towards the right. IMPRESSION: 1. No acute findings identified within the chest abdomen or pelvis. 2. No findings identified to suggest residual or recurrence of tumor. 3. Ascending thoracic aortic aneurysm is again noted, stable. Recommend annual imaging followup by CTA or MRA. This recommendation follows 2010 ACCF/AHA/AATS/ACR/ASA/SCA/SCAI/SIR/STS/SVM Guidelines for the Diagnosis and Management of Patients with Thoracic Aortic Disease. Circulation. 2010; 121: T419-Q222 4. Stable cystic structure within the left adnexa. 5. Aortic atherosclerosis. Electronically Signed   By: Kerby Moors M.D.   On: 02/14/2015 10:46   Ct Chest Wo Contrast  02/14/2015  CLINICAL DATA:  Followup non-Hodgkin's lymphoma and metastatic renal cell carcinoma.  EXAM: CT CHEST, ABDOMEN AND PELVIS WITHOUT CONTRAST TECHNIQUE: Multidetector CT imaging of the chest, abdomen and pelvis was performed following the standard protocol without IV contrast. COMPARISON:  08/25/2014 FINDINGS: CT CHEST Mediastinum: The heart size is normal. No pericardial effusion identified. The ascending thoracic aorta measures 4.1 cm in diameter. There is aortic atherosclerosis. Calcification within the LAD and left circumflex coronary artery noted. No mediastinal or hilar adenopathy identified. No axillary or supraclavicular adenopathy. Lungs/Pleura: No pleural fluid identified. Scar like density is identified within the right middle lobe and appears unchanged from previous exam. Musculoskeletal: The bones appear osteopenic. There is no aggressive lytic or sclerotic bone lesions identified. CT ABDOMEN AND PELVIS Hepatobiliary: Cyst in the right lobe of liver measures 1.4 cm, image 58 of series 2. Unchanged  from previous exam. The gallbladder is unremarkable. No biliary dilatation. Pancreas: Normal appearance of the pancreas. Spleen: Negative Adrenals/Urinary Tract: The adrenal glands appear normal. Previous right nephrectomy. Normal appearance of the left kidney. The urinary bladder is negative. Stomach/Bowel: The stomach is within normal limits. The small bowel loops have a normal course and caliber. No obstruction. Numerous colonic diverticula noted. No pathologic dilatation of the large bowel loops. Vascular/Lymphatic: Calcified atherosclerotic disease involves the abdominal aorta. No aneurysm. No enlarged retroperitoneal or mesenteric adenopathy. No enlarged pelvic or inguinal lymph nodes. Reproductive: The uterus appears normal. Bilobed cystic structure within the left adnexa measures 3.2 cm, image 86 of series 2. Previously this measured the same. Other: There is no ascites or focal fluid collections within the abdomen or pelvis. Musculoskeletal: There is a scoliosis deformity identified. This  is convex towards the right. IMPRESSION: 1. No acute findings identified within the chest abdomen or pelvis. 2. No findings identified to suggest residual or recurrence of tumor. 3. Ascending thoracic aortic aneurysm is again noted, stable. Recommend annual imaging followup by CTA or MRA. This recommendation follows 2010 ACCF/AHA/AATS/ACR/ASA/SCA/SCAI/SIR/STS/SVM Guidelines for the Diagnosis and Management of Patients with Thoracic Aortic Disease. Circulation. 2010; 121: Q676-P950 4. Stable cystic structure within the left adnexa. 5. Aortic atherosclerosis. Electronically Signed   By: Kerby Moors M.D.   On: 02/14/2015 10:46   ASSESSMENT AND PLAN: This is a very pleasant 82 years old white female with metastatic renal cell carcinoma.  She is currently on treatment with Votrient 800 mg by mouth daily status post 24 months of treatment and tolerating it fairly well.  The patient is feeling fine today with no specific complaints.  the recent CT scan of the chest, abdomen and pelvis showed no evidence for disease recurrence.  I discussed the scan results with the patient and her son. I recommended for her to continue her current treatment with Votrient 800 mg by mouth daily. 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 She will come back for follow-up visit in 2 months for reevaluation after repeating CBC, comprehensive metabolic panel and LDH. She will also has Port-A-Cath flush at that time.  She was advised to call immediately if she has any concerning symptoms in the interval. The patient voices understanding of current disease status and treatment options and is in agreement with the current care plan.  All questions were answered. The patient knows to call the clinic with any problems, questions or concerns. We can certainly see the patient much sooner if necessary.  Disclaimer: This note was dictated with voice recognition software. Similar sounding words can  inadvertently be transcribed and may not be corrected upon review.

## 2015-02-28 NOTE — Telephone Encounter (Signed)
per pof to sch pt appt-gave pt copy of avs °

## 2015-03-02 ENCOUNTER — Encounter: Payer: Self-pay | Admitting: Internal Medicine

## 2015-03-02 NOTE — Progress Notes (Signed)
Per [employer]vart[employer] approved asst remainer of year for votrient

## 2015-03-31 ENCOUNTER — Other Ambulatory Visit: Payer: Self-pay | Admitting: Urology

## 2015-03-31 MED ORDER — AMOXICILLIN-POT CLAVULANATE 500-125 MG PO TABS: 1.0000 | ORAL_TABLET | Freq: Two times a day (BID) | ORAL | Status: DC

## 2015-03-31 NOTE — Progress Notes (Signed)
Patient called with frequency and urgency. History of UTIs, she has limited transport options and so I will call in a script for Augmentin to spare cipro as much as possible. She will call the clinic Monday if she is not improved.

## 2015-04-25 ENCOUNTER — Telehealth: Payer: Self-pay | Admitting: Internal Medicine

## 2015-04-25 ENCOUNTER — Encounter: Payer: Self-pay | Admitting: Internal Medicine

## 2015-04-25 ENCOUNTER — Ambulatory Visit (HOSPITAL_BASED_OUTPATIENT_CLINIC_OR_DEPARTMENT_OTHER): Payer: Medicare Other

## 2015-04-25 ENCOUNTER — Other Ambulatory Visit (HOSPITAL_BASED_OUTPATIENT_CLINIC_OR_DEPARTMENT_OTHER): Payer: Medicare Other

## 2015-04-25 ENCOUNTER — Ambulatory Visit (HOSPITAL_BASED_OUTPATIENT_CLINIC_OR_DEPARTMENT_OTHER): Payer: Medicare Other | Admitting: Internal Medicine

## 2015-04-25 VITALS — BP 162/85 | HR 57 | Temp 97.8°F | Resp 17 | Ht 63.0 in | Wt 115.7 lb

## 2015-04-25 DIAGNOSIS — C8330 Diffuse large B-cell lymphoma, unspecified site: Secondary | ICD-10-CM | POA: Diagnosis not present

## 2015-04-25 DIAGNOSIS — C641 Malignant neoplasm of right kidney, except renal pelvis: Secondary | ICD-10-CM | POA: Diagnosis not present

## 2015-04-25 DIAGNOSIS — C7800 Secondary malignant neoplasm of unspecified lung: Secondary | ICD-10-CM | POA: Diagnosis not present

## 2015-04-25 DIAGNOSIS — Z5111 Encounter for antineoplastic chemotherapy: Secondary | ICD-10-CM

## 2015-04-25 DIAGNOSIS — Z95828 Presence of other vascular implants and grafts: Secondary | ICD-10-CM

## 2015-04-25 LAB — CBC WITH DIFFERENTIAL/PLATELET
BASO%: 0.2 % (ref 0.0–2.0)
BASOS ABS: 0 10*3/uL (ref 0.0–0.1)
EOS%: 2 % (ref 0.0–7.0)
Eosinophils Absolute: 0.1 10*3/uL (ref 0.0–0.5)
HEMATOCRIT: 38.1 % (ref 34.8–46.6)
HGB: 12.9 g/dL (ref 11.6–15.9)
LYMPH#: 1 10*3/uL (ref 0.9–3.3)
LYMPH%: 23.9 % (ref 14.0–49.7)
MCH: 35.6 pg — ABNORMAL HIGH (ref 25.1–34.0)
MCHC: 33.9 g/dL (ref 31.5–36.0)
MCV: 105.2 fL — ABNORMAL HIGH (ref 79.5–101.0)
MONO#: 0.3 10*3/uL (ref 0.1–0.9)
MONO%: 7.1 % (ref 0.0–14.0)
NEUT#: 2.7 10*3/uL (ref 1.5–6.5)
NEUT%: 66.8 % (ref 38.4–76.8)
PLATELETS: 206 10*3/uL (ref 145–400)
RBC: 3.62 10*6/uL — ABNORMAL LOW (ref 3.70–5.45)
RDW: 15 % — ABNORMAL HIGH (ref 11.2–14.5)
WBC: 4.1 10*3/uL (ref 3.9–10.3)

## 2015-04-25 LAB — COMPREHENSIVE METABOLIC PANEL
ALT: 15 U/L (ref 0–55)
ANION GAP: 8 meq/L (ref 3–11)
AST: 21 U/L (ref 5–34)
Albumin: 3.3 g/dL — ABNORMAL LOW (ref 3.5–5.0)
Alkaline Phosphatase: 78 U/L (ref 40–150)
BILIRUBIN TOTAL: 0.63 mg/dL (ref 0.20–1.20)
BUN: 14.3 mg/dL (ref 7.0–26.0)
CALCIUM: 9.3 mg/dL (ref 8.4–10.4)
CHLORIDE: 101 meq/L (ref 98–109)
CO2: 28 mEq/L (ref 22–29)
CREATININE: 0.8 mg/dL (ref 0.6–1.1)
EGFR: 66 mL/min/{1.73_m2} — ABNORMAL LOW (ref >90)
Glucose: 87 mg/dl (ref 70–140)
Potassium: 4.2 mEq/L (ref 3.5–5.1)
Sodium: 138 mEq/L (ref 136–145)
Total Protein: 6 g/dL — ABNORMAL LOW (ref 6.4–8.3)

## 2015-04-25 LAB — LACTATE DEHYDROGENASE: LDH: 230 U/L (ref 125–245)

## 2015-04-25 MED ORDER — HEPARIN SOD (PORK) LOCK FLUSH 100 UNIT/ML IV SOLN
500.0000 [IU] | Freq: Once | INTRAVENOUS | Status: AC
  Administered 2015-04-25: 500 [IU] via INTRAVENOUS
  Filled 2015-04-25: qty 5

## 2015-04-25 MED ORDER — SODIUM CHLORIDE 0.9% FLUSH
10.0000 mL | INTRAVENOUS | Status: DC | PRN
  Administered 2015-04-25: 10 mL via INTRAVENOUS
  Filled 2015-04-25: qty 10

## 2015-04-25 NOTE — Telephone Encounter (Signed)
Gave and printed appt sched for june

## 2015-04-25 NOTE — Telephone Encounter (Signed)
Gave and printed appt sched and avs for pt for JUNE °

## 2015-04-25 NOTE — Progress Notes (Signed)
[place] Telephone:([phone]   Fax:(336) [phone]  OFFICE PROGRESS NOTE  [name], MD [address] [place]  [address]  DIAGNOSIS:  #1 non-Hodgkin's lymphoma diagnosed on bone marrow biopsy in June of 2012  #2 metastatic renal cell carcinoma initially diagnosed as stage III (T3a N0M0) renal cell carcinoma diagnosed in June of 2012 with pulmonary metastasis diagnosed in August of 2014.  PRIOR THERAPY:  1) Status post right nephrectomy under the care of Dr. Diona Fanti on 09/02/2010.  2) Systemic chemotherapy with CHOP/Rituxan given every 3 weeks, status post 6 cycles.   CURRENT THERAPY: Votrient 800 mg by mouth daily. Status post 28 months of treatment. This will be changed to Votrient 600 mg by mouth daily starting 04/25/2015.  INTERVAL HISTORY: Melissa Cross 82 y.o. female returns to the clinic today for two-month followup visit accompanied by her son, Melissa Cross. The patient is feeling fine today with no specific complaints except for increasing fatigue. She is tolerating her treatment with Votrient fairly well with no significant adverse effects except for the fatigue and hypertension. She denied having any significant weight loss or night sweats. The patient denied having any chest pain, shortness of breath, cough or hemoptysis. She has no nausea or vomiting. The patient denied having any fever or chills. She had repeat CBC, comprehensivemetabolic panel and LDH and she is here today for evaluation and discussion of her lab results.  MEDICAL HISTORY: Past Medical History  Diagnosis Date  . Hypercholesteremia     takes Zocor daily  . Glaucoma   . Anemia   . Stroke ([place])     hx of TIA  . Blood transfusion   . Thrush 12/17/2010  . Hypertension     takes Nadolol daily  . Hypothyroidism     takes Synthroid daily  . Hypothyroid   . PONV (postoperative nausea and vomiting)   . Shortness of breath     with exertion  .  Headache(784.0)     hx of migraines  . TIA (transient ischemic attack)   . Peripheral neuropathy ([place])   . Arthritis     knee /hip  . Scoliosis   . History of bladder infections   . History of blood transfusion   . Cataract     right  . History of shingles   . Renal cell carcinoma   . Non Hodgkin's lymphoma ([place])   . Cancer ([place])     kidney cancer, NHL   . Cancer [facility])     last chemo 11/04/10     ALLERGIES:  is allergic to crab and dilaudid.  MEDICATIONS:  Current Outpatient Prescriptions  Medication Sig Dispense Refill  . amLODipine (NORVASC) 5 MG tablet Take 1 tablet (5 mg total) by mouth daily. 30 tablet 0  . aspirin EC 81 MG tablet Take 81 mg by mouth at bedtime.     . Calcium Citrate (CITRACAL PO) Take 1 tablet by mouth 3 (three) times a week.     . Calcium-Magnesium-Vitamin D (CITRACAL CALCIUM+D) 600-40-500 MG-MG-UNIT TB24 Take 630 mg by mouth.    . cholecalciferol (VITAMIN D) 400 UNITS TABS Take 400 Units by mouth daily.      [name] levothyroxine (SYNTHROID, LEVOTHROID) 50 MCG tablet Take 50 mcg by mouth every morning.     . Multiple Vitamin (MULITIVITAMIN WITH MINERALS) TABS Take 1 tablet by mouth daily.      . nadolol (CORGARD) 40 MG tablet Take 40 mg by  mouth at bedtime.     . pazopanib (VOTRIENT) 200 MG tablet Take 4 tablets (800 mg total) by mouth daily. Take on an empty stomach. 120 tablet 11  . prednisoLONE acetate (PRED FORTE) 1 % ophthalmic suspension 1 drop.    . timolol (BETIMOL) 0.5 % ophthalmic solution Place 1 drop into the left eye 2 (two) times daily.    . timolol (TIMOPTIC) 0.5 % ophthalmic solution 1 drop.  10  . UNABLE TO FIND Walker  Use with ambulation 1 each 1  . vitamin B-12 (CYANOCOBALAMIN) 1000 MCG tablet Place 1,000 mcg under the tongue daily.      No current facility-administered medications for this visit.   Facility-Administered Medications Ordered in Other Visits  Medication Dose Route Frequency Provider Last Rate Last Dose  . sodium  chloride 0.9 % injection 10 mL  10 mL Intravenous PRN Curt Bears, MD   10 mL at 05/17/12 0846    SURGICAL HISTORY:  Past Surgical History  Procedure Laterality Date  . Nephrectomy      Right Side  . Portacath placement      right subclavian  . Tonsillectomy      as a child  . Mouth surgery    . Left cataract removed    . Video bronchoscopy with endobronchial ultrasound N/A 08/06/2012    Procedure: VIDEO BRONCHOSCOPY WITH ENDOBRONCHIAL ULTRASOUND;  Surgeon: Collene Gobble, MD;  Location: [facility];  Service: Thoracic;  Laterality: N/A;  . Video bronchoscopy with endobronchial navigation N/A 08/06/2012    Procedure: VIDEO BRONCHOSCOPY WITH ENDOBRONCHIAL NAVIGATION;  Surgeon: Collene Gobble, MD;  Location: [facility];  Service: Thoracic;  Laterality: N/A;    REVIEW OF SYSTEMS:  A comprehensive review of systems was negative except for: Constitutional: positive for fatigue Musculoskeletal: positive for muscle weakness   PHYSICAL EXAMINATION: General appearance: alert, cooperative, fatigued and no distress Head: Normocephalic, without obvious abnormality, atraumatic Neck: no adenopathy Lymph nodes: Cervical, supraclavicular, and axillary nodes normal. Resp: clear to auscultation bilaterally Cardio: regular rate and rhythm, S1, S2 normal, no murmur, click, rub or gallop GI: soft, non-tender; bowel sounds normal; no masses,  no organomegaly Extremities: extremities normal, atraumatic, no cyanosis or edema Neurologic: Alert and oriented X 3, normal strength and tone. Normal symmetric reflexes. Normal coordination and gait  ECOG PERFORMANCE STATUS: 1 - Symptomatic but completely ambulatory  Blood pressure 162/85, pulse 57, temperature 97.8 F (36.6 C), temperature source Oral, resp. rate 17, height _0  (1.6 m), weight 115 lb 11.2 oz (52.481 kg), SpO2 98 %.  LABORATORY DATA: Lab Results  Component Value Date   WBC 4.1 04/25/2015   HGB 12.9 04/25/2015   HCT 38.1 04/25/2015   MCV 105.2*  04/25/2015   PLT 206 04/25/2015      Chemistry      Component Value Date/Time   NA 138 02/14/2015 0840   NA 136* 06/12/2013 0503   K 3.9 02/14/2015 0840   K 3.4* 06/12/2013 0503   CL 98 06/12/2013 0503   CL 102 04/01/2012 0805   CO2 26 02/14/2015 0840   CO2 25 06/12/2013 0503   BUN 12.2 02/14/2015 0840   BUN 5* 06/12/2013 0503   CREATININE 0.8 02/14/2015 0840   CREATININE 0.79 06/12/2013 0503      Component Value Date/Time   CALCIUM 9.4 02/14/2015 0840   CALCIUM 8.9 06/12/2013 0503   ALKPHOS 78 02/14/2015 0840   ALKPHOS 94 06/09/2013 2230   AST 20 02/14/2015 0840   AST 23  06/09/2013 2230   ALT 16 02/14/2015 0840   ALT 17 06/09/2013 2230   BILITOT 0.55 02/14/2015 0840   BILITOT 0.9 06/09/2013 2230       RADIOGRAPHIC STUDIES: No results found. ASSESSMENT AND PLAN: This is a very pleasant 82 years old white female with metastatic renal cell carcinoma.  She is currently on treatment with Votrient 800 mg by mouth daily status post 26 months of treatment and tolerating it fairly well.  The patient is feeling fine today with no specific complaints except for persistent fatigue. She also continues to have hypertension likely secondary to treatment with Votrient and she is currently on several blood pressure medication. CBC today is unremarkable but comprehensive metabolic panel and LDH are still pending. I recommended for her to continue her current treatment with Votrient but I will reduce the dose to 600 mg by mouth daily.  She will come back for follow-up visit in 2 months for reevaluation after repeating CBC, comprehensive metabolic panel and LDH. She will also has Port-A-Cath flush at that time.  She was advised to call immediately if she has any concerning symptoms in the interval. The patient voices understanding of current disease status and treatment options and is in agreement with the current care plan.  All questions were answered. The patient knows to call the  clinic with any problems, questions or concerns. We can certainly see the patient much sooner if necessary.  Disclaimer: This note was dictated with voice recognition software. Similar sounding words can inadvertently be transcribed and may not be corrected upon review.

## 2015-04-25 NOTE — Patient Instructions (Signed)

## 2015-04-30 ENCOUNTER — Ambulatory Visit: Payer: Medicare Other | Admitting: Internal Medicine

## 2015-04-30 ENCOUNTER — Other Ambulatory Visit: Payer: Medicare Other

## 2015-05-02 ENCOUNTER — Other Ambulatory Visit: Payer: Medicare Other

## 2015-05-02 ENCOUNTER — Ambulatory Visit: Payer: Medicare Other | Admitting: Internal Medicine

## 2015-05-18 DIAGNOSIS — H1851 Endothelial corneal dystrophy: Secondary | ICD-10-CM | POA: Diagnosis not present

## 2015-05-21 DIAGNOSIS — R350 Frequency of micturition: Secondary | ICD-10-CM | POA: Diagnosis not present

## 2015-05-21 DIAGNOSIS — R300 Dysuria: Secondary | ICD-10-CM | POA: Diagnosis not present

## 2015-05-21 DIAGNOSIS — N3000 Acute cystitis without hematuria: Secondary | ICD-10-CM | POA: Diagnosis not present

## 2015-05-21 DIAGNOSIS — Z0000 Encounter for general adult medical examination without abnormal findings: Secondary | ICD-10-CM | POA: Diagnosis not present

## 2015-05-21 DIAGNOSIS — N390 Urinary tract infection, site not specified: Secondary | ICD-10-CM | POA: Diagnosis not present

## 2015-05-28 DIAGNOSIS — Z0000 Encounter for general adult medical examination without abnormal findings: Secondary | ICD-10-CM | POA: Diagnosis not present

## 2015-05-28 DIAGNOSIS — N3000 Acute cystitis without hematuria: Secondary | ICD-10-CM | POA: Diagnosis not present

## 2015-05-28 DIAGNOSIS — R300 Dysuria: Secondary | ICD-10-CM | POA: Diagnosis not present

## 2015-05-31 DIAGNOSIS — I700 Atherosclerosis of aorta: Secondary | ICD-10-CM | POA: Diagnosis not present

## 2015-05-31 DIAGNOSIS — I10 Essential (primary) hypertension: Secondary | ICD-10-CM | POA: Diagnosis not present

## 2015-06-05 IMAGING — PT NM PET TUM IMG INITIAL (PI) SKULL BASE T - THIGH
1 of 5 series · 1 of 25 positions shown · non-contrast
Comparison: 07/02/2012 chest CT.  04/01/2012 chest, abdomen, and
pelvic CTs.  Prior PET of 06/24/2010.

CLINICAL DATA: Initial treatment strategy for pulmonary nodules.
History of non-Hodgkins lymphoma and renal cell carcinoma..

NUCLEAR MEDICINE PET SKULL BASE TO THIGH
Fasting Blood Glucose:  102
TECHNIQUE: 18.3 mCi F-18 FDG was injected intravenously. CT data
was obtained and used for attenuation correction and anatomic
localization only.  (This was not acquired as a diagnostic CT
examination.) Additional exam technical data entered on
technologist worksheet.

[Series 1: pet ac · axial · 3.3mm · 4.69mm/px · 1 of 223 slices shown]
[im 127/223]
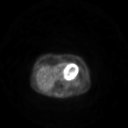

[1 of 25 positions shown; findings below may reference images not displayed]

FINDINGS: Neck: No abnormal hypermetabolism.

Chest:  Hypermetabolic subcarinal node which measures 1.5 cm and a
S.U.V. max of 10.1 on image 76/series 2.

Hypermetabolism corresponding to 2 adjacent right middle lobe lung
nodules.  These measure up to 1.9 cm and a S.U.V. max of 6.2 on
image 88/series 2.

Abdomen/Pelvis:  Equivocal hypermetabolism in the region of mild
left adrenal nodularity.  Example image 115/series 2.

Skeleton:  No abnormal marrow activity.

CT  images performed for attenuation correction demonstrate no
significant findings within the neck.

A right-sided Port-A-Cath which terminates at the low SVC.  Other
chest findings deferred to recent diagnostic CT.

Right nephrectomy.  Motion degraded evaluation of the abdomen.
Right hepatic lobe cyst.  Redemonstration of a left adnexal cystic
lesion at 3.0 cm on image 171/series 2.
IMPRESSION: 1.  Hypermetabolic subcarinal node, suspicious for metastatic
disease.
2.  Hypermetabolism corresponding to peripheral right middle lobe
nodules.  Also suspicious for metastasis.  Infectious etiology of
the pulmonary findings is felt less likely.
3.  Equivocal left adrenal hypermetabolism with nodularity.  This
warrants followup attention.

## 2015-06-20 ENCOUNTER — Ambulatory Visit (HOSPITAL_BASED_OUTPATIENT_CLINIC_OR_DEPARTMENT_OTHER): Payer: Medicare Other

## 2015-06-20 ENCOUNTER — Encounter: Payer: Self-pay | Admitting: Internal Medicine

## 2015-06-20 ENCOUNTER — Ambulatory Visit (HOSPITAL_BASED_OUTPATIENT_CLINIC_OR_DEPARTMENT_OTHER): Payer: Medicare Other | Admitting: Internal Medicine

## 2015-06-20 ENCOUNTER — Telehealth: Payer: Self-pay | Admitting: Internal Medicine

## 2015-06-20 ENCOUNTER — Telehealth: Payer: Self-pay | Admitting: *Deleted

## 2015-06-20 ENCOUNTER — Other Ambulatory Visit (HOSPITAL_BASED_OUTPATIENT_CLINIC_OR_DEPARTMENT_OTHER): Payer: Medicare Other

## 2015-06-20 VITALS — BP 155/81 | HR 60 | Temp 98.4°F | Resp 17 | Wt 115.8 lb

## 2015-06-20 DIAGNOSIS — C7800 Secondary malignant neoplasm of unspecified lung: Secondary | ICD-10-CM

## 2015-06-20 DIAGNOSIS — C641 Malignant neoplasm of right kidney, except renal pelvis: Secondary | ICD-10-CM

## 2015-06-20 DIAGNOSIS — C8330 Diffuse large B-cell lymphoma, unspecified site: Secondary | ICD-10-CM

## 2015-06-20 DIAGNOSIS — Z95828 Presence of other vascular implants and grafts: Secondary | ICD-10-CM

## 2015-06-20 DIAGNOSIS — D72819 Decreased white blood cell count, unspecified: Secondary | ICD-10-CM

## 2015-06-20 DIAGNOSIS — Z8572 Personal history of non-Hodgkin lymphomas: Secondary | ICD-10-CM | POA: Diagnosis not present

## 2015-06-20 DIAGNOSIS — R530 Neoplastic (malignant) related fatigue: Secondary | ICD-10-CM | POA: Diagnosis not present

## 2015-06-20 DIAGNOSIS — Z5111 Encounter for antineoplastic chemotherapy: Secondary | ICD-10-CM

## 2015-06-20 LAB — CBC WITH DIFFERENTIAL/PLATELET
BASO%: 0.3 % (ref 0.0–2.0)
BASOS ABS: 0 10*3/uL (ref 0.0–0.1)
EOS%: 1.3 % (ref 0.0–7.0)
Eosinophils Absolute: 0.1 10*3/uL (ref 0.0–0.5)
HEMATOCRIT: 34.5 % — AB (ref 34.8–46.6)
HEMOGLOBIN: 12 g/dL (ref 11.6–15.9)
LYMPH#: 0.8 10*3/uL — AB (ref 0.9–3.3)
LYMPH%: 20.4 % (ref 14.0–49.7)
MCH: 35.4 pg — AB (ref 25.1–34.0)
MCHC: 34.8 g/dL (ref 31.5–36.0)
MCV: 101.8 fL — AB (ref 79.5–101.0)
MONO#: 0.5 10*3/uL (ref 0.1–0.9)
MONO%: 11.9 % (ref 0.0–14.0)
NEUT#: 2.5 10*3/uL (ref 1.5–6.5)
NEUT%: 66.1 % (ref 38.4–76.8)
Platelets: 198 10*3/uL (ref 145–400)
RBC: 3.39 10*6/uL — ABNORMAL LOW (ref 3.70–5.45)
RDW: 14.7 % — AB (ref 11.2–14.5)
WBC: 3.8 10*3/uL — ABNORMAL LOW (ref 3.9–10.3)

## 2015-06-20 LAB — LACTATE DEHYDROGENASE: LDH: 188 U/L (ref 125–245)

## 2015-06-20 LAB — COMPREHENSIVE METABOLIC PANEL
ALBUMIN: 3.3 g/dL — AB (ref 3.5–5.0)
ALK PHOS: 74 U/L (ref 40–150)
ALT: 17 U/L (ref 0–55)
ANION GAP: 8 meq/L (ref 3–11)
AST: 21 U/L (ref 5–34)
BUN: 16.4 mg/dL (ref 7.0–26.0)
CALCIUM: 9.1 mg/dL (ref 8.4–10.4)
CHLORIDE: 89 meq/L — AB (ref 98–109)
CO2: 30 mEq/L — ABNORMAL HIGH (ref 22–29)
Creatinine: 0.8 mg/dL (ref 0.6–1.1)
EGFR: 66 mL/min/{1.73_m2} — AB (ref >90)
Glucose: 83 mg/dl (ref 70–140)
POTASSIUM: 3.3 meq/L — AB (ref 3.5–5.1)
Sodium: 127 mEq/L — ABNORMAL LOW (ref 136–145)
Total Bilirubin: 0.65 mg/dL (ref 0.20–1.20)
Total Protein: 6 g/dL — ABNORMAL LOW (ref 6.4–8.3)

## 2015-06-20 MED ORDER — HEPARIN SOD (PORK) LOCK FLUSH 100 UNIT/ML IV SOLN
500.0000 [IU] | Freq: Once | INTRAVENOUS | Status: AC
  Administered 2015-06-20: 500 [IU] via INTRAVENOUS
  Filled 2015-06-20: qty 5

## 2015-06-20 MED ORDER — SODIUM CHLORIDE 0.9% FLUSH
10.0000 mL | INTRAVENOUS | Status: DC | PRN
  Administered 2015-06-20: 10 mL via INTRAVENOUS
  Filled 2015-06-20: qty 10

## 2015-06-20 NOTE — Telephone Encounter (Signed)
Notified pt to increase salt and K into her diet. Went over K+ rich foods, pt verbalized she will also google other food choices.

## 2015-06-20 NOTE — Telephone Encounter (Signed)
Gave and pritned appt sched and avs for pt for July....gv barium

## 2015-06-20 NOTE — Patient Instructions (Signed)

## 2015-06-20 NOTE — Progress Notes (Signed)
Quick Note:  Call patient with the result and increase salt intake in diet slightly. Also increase K rich diet. ______

## 2015-06-20 NOTE — Progress Notes (Signed)
[facility] Telephone:([phone]) [phone]   Fax:(336) [phone]  OFFICE PROGRESS NOTE  [name], MD [address] [place] [address]  DIAGNOSIS:  #1 non-Hodgkin's lymphoma diagnosed on bone marrow biopsy in June of 2012  #2 metastatic renal cell carcinoma initially diagnosed as stage III (T3a N0M0) renal cell carcinoma diagnosed in June of 2012 with pulmonary metastasis diagnosed in August of 2014.  PRIOR THERAPY:  1) Status post right nephrectomy under the care of Dr. Diona Fanti on 09/02/2010.  2) Systemic chemotherapy with CHOP/Rituxan given every 3 weeks, status post 6 cycles.   CURRENT THERAPY: Votrient 800 mg by mouth daily. Status post 28 months of treatment. This will be changed to Votrient 600 mg by mouth daily starting 04/25/2015.  INTERVAL HISTORY: Melissa Cross 82 y.o. female returns to the clinic today for two-month followup visit accompanied by her son, [name]. The patient is feeling fine today with no specific complaints except for persistent fatigue. She is currently on reduced dose of Votrient 600 mg by mouth daily since 04/25/2015. Her dose was reduced secondary to fatigue and hypertension. She is also complaining of alternating diarrhea and constipation. She was treated recently for urinary tract infection by her urologist. She denied having any significant weight loss or night sweats. The patient denied having any chest pain, shortness of breath, cough or hemoptysis. She has no nausea or vomiting. The patient denied having any fever or chills. She had repeat CBC, comprehensivemetabolic panel and LDH and she is here today for evaluation and discussion of her lab results.  MEDICAL HISTORY: Past Medical History  Diagnosis Date  . Hypercholesteremia     takes Zocor daily  . Glaucoma   . Anemia   . Stroke ([place])     hx of TIA  . Blood transfusion   . Thrush 12/17/2010  . Hypertension     takes Nadolol daily  . Hypothyroidism       takes Synthroid daily  . Hypothyroid   . PONV (postoperative nausea and vomiting)   . Shortness of breath     with exertion  . Headache(784.0)     hx of migraines  . TIA (transient ischemic attack)   . Peripheral neuropathy ([place])   . Arthritis     knee /hip  . Scoliosis   . History of bladder infections   . History of blood transfusion   . Cataract     right  . History of shingles   . Renal cell carcinoma   . Non Hodgkin's lymphoma ([place])   . Cancer ([place])     kidney cancer, NHL   . Cancer [facility])     last chemo 11/04/10     ALLERGIES:  is allergic to crab and dilaudid.  MEDICATIONS:  Current Outpatient Prescriptions  Medication Sig Dispense Refill  . amLODipine (NORVASC) 5 MG tablet Take 1 tablet (5 mg total) by mouth daily. 30 tablet 0  . aspirin EC 81 MG tablet Take 81 mg by mouth at bedtime.     . Calcium Citrate (CITRACAL PO) Take 1 tablet by mouth 3 (three) times a week.     . Calcium-Magnesium-Vitamin D (CITRACAL CALCIUM+D) 600-40-500 MG-MG-UNIT TB24 Take 630 mg by mouth.    . cholecalciferol (VITAMIN D) 400 UNITS TABS Take 400 Units by mouth daily.      [name] levothyroxine (SYNTHROID, LEVOTHROID) 50 MCG tablet Take 50 mcg by mouth every morning.     . Multiple Vitamin (  MULITIVITAMIN WITH MINERALS) TABS Take 1 tablet by mouth daily.      . nadolol (CORGARD) 40 MG tablet Take 40 mg by mouth at bedtime.     . pazopanib (VOTRIENT) 200 MG tablet Take 4 tablets (800 mg total) by mouth daily. Take on an empty stomach. 120 tablet 11  . prednisoLONE acetate (PRED FORTE) 1 % ophthalmic suspension 1 drop.    . timolol (BETIMOL) 0.5 % ophthalmic solution Place 1 drop into the left eye 2 (two) times daily.    . timolol (TIMOPTIC) 0.5 % ophthalmic solution 1 drop.  10  . UNABLE TO FIND Walker  Use with ambulation 1 each 1  . vitamin B-12 (CYANOCOBALAMIN) 1000 MCG tablet Place 1,000 mcg under the tongue daily.      No current facility-administered medications for this visit.    Facility-Administered Medications Ordered in Other Visits  Medication Dose Route Frequency Provider Last Rate Last Dose  . sodium chloride 0.9 % injection 10 mL  10 mL Intravenous PRN Curt Bears, MD   10 mL at 05/17/12 0846    SURGICAL HISTORY:  Past Surgical History  Procedure Laterality Date  . Nephrectomy      Right Side  . Portacath placement      right subclavian  . Tonsillectomy      as a child  . Mouth surgery    . Left cataract removed    . Video bronchoscopy with endobronchial ultrasound N/A 08/06/2012    Procedure: VIDEO BRONCHOSCOPY WITH ENDOBRONCHIAL ULTRASOUND;  Surgeon: Collene Gobble, MD;  Location: [facility];  Service: Thoracic;  Laterality: N/A;  . Video bronchoscopy with endobronchial navigation N/A 08/06/2012    Procedure: VIDEO BRONCHOSCOPY WITH ENDOBRONCHIAL NAVIGATION;  Surgeon: Collene Gobble, MD;  Location: [facility];  Service: Thoracic;  Laterality: N/A;    REVIEW OF SYSTEMS:  A comprehensive review of systems was negative except for: Constitutional: positive for fatigue Gastrointestinal: positive for constipation and diarrhea Musculoskeletal: positive for muscle weakness   PHYSICAL EXAMINATION: General appearance: alert, cooperative, fatigued and no distress Head: Normocephalic, without obvious abnormality, atraumatic Neck: no adenopathy Lymph nodes: Cervical, supraclavicular, and axillary nodes normal. Resp: clear to auscultation bilaterally Cardio: regular rate and rhythm, S1, S2 normal, no murmur, click, rub or gallop GI: soft, non-tender; bowel sounds normal; no masses,  no organomegaly Extremities: extremities normal, atraumatic, no cyanosis or edema Neurologic: Alert and oriented X 3, normal strength and tone. Normal symmetric reflexes. Normal coordination and gait  ECOG PERFORMANCE STATUS: 1 - Symptomatic but completely ambulatory  Blood pressure 155/81, pulse 60, temperature 98.4 F (36.9 C), temperature source Oral, resp. rate 17, weight 115 lb  12.8 oz (52.527 kg), SpO2 98 %.  LABORATORY DATA: Lab Results  Component Value Date   WBC 3.8* 06/20/2015   HGB 12.0 06/20/2015   HCT 34.5* 06/20/2015   MCV 101.8* 06/20/2015   PLT 198 06/20/2015      Chemistry      Component Value Date/Time   NA 138 04/25/2015 1307   NA 136* 06/12/2013 0503   K 4.2 04/25/2015 1307   K 3.4* 06/12/2013 0503   CL 98 06/12/2013 0503   CL 102 04/01/2012 0805   CO2 28 04/25/2015 1307   CO2 25 06/12/2013 0503   BUN 14.3 04/25/2015 1307   BUN 5* 06/12/2013 0503   CREATININE 0.8 04/25/2015 1307   CREATININE 0.79 06/12/2013 0503      Component Value Date/Time   CALCIUM 9.3 04/25/2015 1307  CALCIUM 8.9 06/12/2013 0503   ALKPHOS 78 04/25/2015 1307   ALKPHOS 94 06/09/2013 2230   AST 21 04/25/2015 1307   AST 23 06/09/2013 2230   ALT 15 04/25/2015 1307   ALT 17 06/09/2013 2230   BILITOT 0.63 04/25/2015 1307   BILITOT 0.9 06/09/2013 2230       RADIOGRAPHIC STUDIES: No results found. ASSESSMENT AND PLAN: This is a very pleasant 82 years old white female with metastatic renal cell carcinoma.  She is currently on treatment with Votrient 800 mg by mouth daily status post 26 months of treatment and tolerating it fairly well.  The patient is feeling fine today with no specific complaints except for persistent fatigue. CBC today showed mild leukocytopenia. Components metabolic panel is still pending I recommended for her to continue her current treatment with Votrient 600 mg by mouth daily.  She will come back for follow-up visit in one month for reevaluation after repeating CBC, comprehensive metabolic panel and LDH as well as CT scan of the chest, abdomen and pelvis without contrast for restaging of her disease. She will also has Port-A-Cath flush at that time.  She was advised to call immediately if she has any concerning symptoms in the interval. The patient voices understanding of current disease status and treatment options and is in agreement  with the current care plan.  All questions were answered. The patient knows to call the clinic with any problems, questions or concerns. We can certainly see the patient much sooner if necessary.  Disclaimer: This note was dictated with voice recognition software. Similar sounding words can inadvertently be transcribed and may not be corrected upon review.

## 2015-06-20 NOTE — Telephone Encounter (Signed)
-----   Message from Curt Bears, MD sent at 06/20/2015  2:53 PM EDT ----- Call patient with the result and increase salt intake in diet slightly. Also increase K rich diet.

## 2015-07-20 ENCOUNTER — Other Ambulatory Visit (HOSPITAL_BASED_OUTPATIENT_CLINIC_OR_DEPARTMENT_OTHER): Payer: Medicare Other

## 2015-07-20 ENCOUNTER — Ambulatory Visit (HOSPITAL_COMMUNITY)
Admission: RE | Admit: 2015-07-20 | Discharge: 2015-07-20 | Disposition: A | Discharge status: Home / Self Care | Payer: Medicare Other | Source: Ambulatory Visit | Attending: Internal Medicine | Admitting: Internal Medicine

## 2015-07-20 DIAGNOSIS — C8330 Diffuse large B-cell lymphoma, unspecified site: Secondary | ICD-10-CM | POA: Diagnosis not present

## 2015-07-20 DIAGNOSIS — N83292 Other ovarian cyst, left side: Secondary | ICD-10-CM | POA: Insufficient documentation

## 2015-07-20 DIAGNOSIS — K5750 Diverticulosis of both small and large intestine without perforation or abscess without bleeding: Secondary | ICD-10-CM | POA: Insufficient documentation

## 2015-07-20 DIAGNOSIS — C641 Malignant neoplasm of right kidney, except renal pelvis: Secondary | ICD-10-CM

## 2015-07-20 DIAGNOSIS — M47816 Spondylosis without myelopathy or radiculopathy, lumbar region: Secondary | ICD-10-CM | POA: Diagnosis not present

## 2015-07-20 DIAGNOSIS — C649 Malignant neoplasm of unspecified kidney, except renal pelvis: Secondary | ICD-10-CM

## 2015-07-20 DIAGNOSIS — R935 Abnormal findings on diagnostic imaging of other abdominal regions, including retroperitoneum: Secondary | ICD-10-CM | POA: Insufficient documentation

## 2015-07-20 DIAGNOSIS — M5134 Other intervertebral disc degeneration, thoracic region: Secondary | ICD-10-CM | POA: Diagnosis not present

## 2015-07-20 DIAGNOSIS — I712 Thoracic aortic aneurysm, without rupture: Secondary | ICD-10-CM | POA: Insufficient documentation

## 2015-07-20 DIAGNOSIS — I700 Atherosclerosis of aorta: Secondary | ICD-10-CM | POA: Insufficient documentation

## 2015-07-20 DIAGNOSIS — M47814 Spondylosis without myelopathy or radiculopathy, thoracic region: Secondary | ICD-10-CM | POA: Insufficient documentation

## 2015-07-20 DIAGNOSIS — M4186 Other forms of scoliosis, lumbar region: Secondary | ICD-10-CM | POA: Insufficient documentation

## 2015-07-20 DIAGNOSIS — M5136 Other intervertebral disc degeneration, lumbar region: Secondary | ICD-10-CM | POA: Diagnosis not present

## 2015-07-20 DIAGNOSIS — K5730 Diverticulosis of large intestine without perforation or abscess without bleeding: Secondary | ICD-10-CM | POA: Diagnosis not present

## 2015-07-20 LAB — CBC WITH DIFFERENTIAL/PLATELET
BASO%: 0.8 % (ref 0.0–2.0)
Basophils Absolute: 0 10*3/uL (ref 0.0–0.1)
EOS%: 0.6 % (ref 0.0–7.0)
Eosinophils Absolute: 0 10*3/uL (ref 0.0–0.5)
HCT: 39 % (ref 34.8–46.6)
HGB: 13.4 g/dL (ref 11.6–15.9)
LYMPH%: 10.7 % — AB (ref 14.0–49.7)
MCH: 35.2 pg — ABNORMAL HIGH (ref 25.1–34.0)
MCHC: 34.3 g/dL (ref 31.5–36.0)
MCV: 102.7 fL — AB (ref 79.5–101.0)
MONO#: 0.3 10*3/uL (ref 0.1–0.9)
MONO%: 10.2 % (ref 0.0–14.0)
NEUT%: 77.7 % — ABNORMAL HIGH (ref 38.4–76.8)
NEUTROS ABS: 2.6 10*3/uL (ref 1.5–6.5)
Platelets: 202 10*3/uL (ref 145–400)
RBC: 3.8 10*6/uL (ref 3.70–5.45)
RDW: 16.4 % — ABNORMAL HIGH (ref 11.2–14.5)
WBC: 3.4 10*3/uL — AB (ref 3.9–10.3)
lymph#: 0.4 10*3/uL — ABNORMAL LOW (ref 0.9–3.3)

## 2015-07-20 LAB — COMPREHENSIVE METABOLIC PANEL
ALT: 17 U/L (ref 0–55)
AST: 28 U/L (ref 5–34)
Albumin: 3.8 g/dL (ref 3.5–5.0)
Alkaline Phosphatase: 88 U/L (ref 40–150)
Anion Gap: 12 mEq/L — ABNORMAL HIGH (ref 3–11)
BILIRUBIN TOTAL: 0.97 mg/dL (ref 0.20–1.20)
BUN: 10 mg/dL (ref 7.0–26.0)
CHLORIDE: 87 meq/L — AB (ref 98–109)
CO2: 32 meq/L — AB (ref 22–29)
Calcium: 9.7 mg/dL (ref 8.4–10.4)
Creatinine: 0.8 mg/dL (ref 0.6–1.1)
EGFR: 62 mL/min/{1.73_m2} — AB (ref >90)
GLUCOSE: 90 mg/dL (ref 70–140)
Potassium: 3.3 mEq/L — ABNORMAL LOW (ref 3.5–5.1)
SODIUM: 132 meq/L — AB (ref 136–145)
TOTAL PROTEIN: 6.8 g/dL (ref 6.4–8.3)

## 2015-07-20 LAB — LACTATE DEHYDROGENASE: LDH: 236 U/L (ref 125–245)

## 2015-07-26 ENCOUNTER — Telehealth: Payer: Self-pay | Admitting: Internal Medicine

## 2015-07-26 ENCOUNTER — Encounter: Payer: Self-pay | Admitting: Internal Medicine

## 2015-07-26 ENCOUNTER — Ambulatory Visit (HOSPITAL_BASED_OUTPATIENT_CLINIC_OR_DEPARTMENT_OTHER): Payer: Medicare Other | Admitting: Internal Medicine

## 2015-07-26 VITALS — BP 160/84 | HR 65 | Temp 97.9°F | Resp 18 | Ht 63.0 in | Wt 112.4 lb

## 2015-07-26 DIAGNOSIS — C641 Malignant neoplasm of right kidney, except renal pelvis: Secondary | ICD-10-CM

## 2015-07-26 DIAGNOSIS — I10 Essential (primary) hypertension: Secondary | ICD-10-CM | POA: Diagnosis not present

## 2015-07-26 DIAGNOSIS — E876 Hypokalemia: Secondary | ICD-10-CM | POA: Diagnosis not present

## 2015-07-26 DIAGNOSIS — C8330 Diffuse large B-cell lymphoma, unspecified site: Secondary | ICD-10-CM

## 2015-07-26 DIAGNOSIS — R5383 Other fatigue: Secondary | ICD-10-CM | POA: Diagnosis not present

## 2015-07-26 DIAGNOSIS — Z5111 Encounter for antineoplastic chemotherapy: Secondary | ICD-10-CM

## 2015-07-26 DIAGNOSIS — C7800 Secondary malignant neoplasm of unspecified lung: Secondary | ICD-10-CM

## 2015-07-26 NOTE — Telephone Encounter (Signed)
Gave pt cal & avs, pt requested flush to be on aug 17, pt also wanted to hold off on flushes until she sees MD again

## 2015-07-26 NOTE — Progress Notes (Signed)
[place] [phone]   [phone]  OFFICE PROGRESS NOTE  [name], MD [address] [place] [address]  DIAGNOSIS:  #1 non-Hodgkin's lymphoma diagnosed on bone marrow biopsy in June of 2012  #2 metastatic renal cell carcinoma initially diagnosed as stage III (T3a N0M0) renal cell carcinoma diagnosed in June of 2012 with pulmonary metastasis diagnosed in August of 2014.  PRIOR THERAPY:  1) Status post right nephrectomy under the care of Dr. Diona Fanti on 09/02/2010.  2) Systemic chemotherapy with CHOP/Rituxan given every 3 weeks, status post 6 cycles.   CURRENT THERAPY: Votrient 800 mg by mouth daily. Status post 28 months of treatment. This will be changed to Votrient 600 mg by mouth daily between 04/25/2015 until 07/26/2015. This was further reduced to 400 mg by mouth daily starting 07/27/2015 secondary to fatigue.  INTERVAL HISTORY: Melissa Cross 82 y.o. female returns to the clinic today for two-month followup visit accompanied by her son, [name]. The patient is feeling fine today with no specific complaints except for persistent fatigue. She is currently on reduced dose of Votrient 600 mg by mouth daily since 04/25/2015 but continues to have persistent fatigue. She denied having any significant night sweats but she lost 3 pounds since her last visit. The patient denied having any chest pain, shortness of breath, cough or hemoptysis. She has no nausea or vomiting. The patient denied having any fever or chills. She had repeat CBC, comprehensive metabolic panel and LDH as well as CT scan of the chest, abdomen and pelvis without contrast and she is here today for evaluation and discussion of her lab and scan results.  MEDICAL HISTORY: Past Medical History  Diagnosis Date  . Hypercholesteremia     takes Zocor daily  . Glaucoma   . Anemia   . Stroke ([place])     hx of TIA  . Blood transfusion   . Thrush 12/17/2010  .  Hypertension     takes Nadolol daily  . Hypothyroidism     takes Synthroid daily  . Hypothyroid   . PONV (postoperative nausea and vomiting)   . Shortness of breath     with exertion  . Headache(784.0)     hx of migraines  . TIA (transient ischemic attack)   . Peripheral neuropathy ([place])   . Arthritis     knee /hip  . Scoliosis   . History of bladder infections   . History of blood transfusion   . Cataract     right  . History of shingles   . Renal cell carcinoma   . Non Hodgkin's lymphoma ([place])   . Cancer ([place])     kidney cancer, NHL   . Cancer [facility])     last chemo 11/04/10     ALLERGIES:  is allergic to crab and dilaudid.  MEDICATIONS:  Current Outpatient Prescriptions  Medication Sig Dispense Refill  . amLODipine (NORVASC) 5 MG tablet Take 1 tablet (5 mg total) by mouth daily. 30 tablet 0  . amoxicillin-clavulanate (AUGMENTIN) 500-125 MG tablet TK 1 T PO BID  0  . aspirin EC 81 MG tablet Take 81 mg by mouth at bedtime.     . Calcium Citrate (CITRACAL PO) Take 1 tablet by mouth 3 (three) times a week.     . Calcium-Magnesium-Vitamin D (CITRACAL CALCIUM+D) 600-40-500 MG-MG-UNIT TB24 Take 630 mg by mouth.    . cephALEXin (KEFLEX) 500 MG capsule TK 1 C PO  TID UNTIL GONE  0  . cholecalciferol (VITAMIN D) 400 UNITS TABS Take 400 Units by mouth daily.      [name] levothyroxine (SYNTHROID, LEVOTHROID) 50 MCG tablet Take 50 mcg by mouth every morning.     [name] lisinopril-hydrochlorothiazide (PRINZIDE,ZESTORETIC) 10-12.5 MG tablet TK 1 T PO QD  5  . Multiple Vitamin (MULITIVITAMIN WITH MINERALS) TABS Take 1 tablet by mouth daily.      . nadolol (CORGARD) 40 MG tablet Take 40 mg by mouth at bedtime.     . pazopanib (VOTRIENT) 200 MG tablet Take 4 tablets (800 mg total) by mouth daily. Take on an empty stomach. 120 tablet 11  . phenazopyridine (PYRIDIUM) 200 MG tablet TK 1 T PO Q 8 H PRN  0  . prednisoLONE acetate (PRED FORTE) 1 % ophthalmic suspension 1 drop.    [name]  sulfamethoxazole-trimethoprim (BACTRIM DS,SEPTRA DS) 800-160 MG tablet TK 1 T PO BID  0  . timolol (BETIMOL) 0.5 % ophthalmic solution Place 1 drop into the left eye 2 (two) times daily.    . timolol (TIMOPTIC) 0.5 % ophthalmic solution 1 drop.  10  . UNABLE TO FIND Walker  Use with ambulation 1 each 1  . vitamin B-12 (CYANOCOBALAMIN) 1000 MCG tablet Place 1,000 mcg under the tongue daily.      No current facility-administered medications for this visit.   Facility-Administered Medications Ordered in Other Visits  Medication Dose Route Frequency Provider Last Rate Last Dose  . sodium chloride 0.9 % injection 10 mL  10 mL Intravenous PRN Curt Bears, MD   10 mL at 05/17/12 0846    SURGICAL HISTORY:  Past Surgical History  Procedure Laterality Date  . Nephrectomy      Right Side  . Portacath placement      right subclavian  . Tonsillectomy      as a child  . Mouth surgery    . Left cataract removed    . Video bronchoscopy with endobronchial ultrasound N/A 08/06/2012    Procedure: VIDEO BRONCHOSCOPY WITH ENDOBRONCHIAL ULTRASOUND;  Surgeon: Collene Gobble, MD;  Location: [place];  Service: Thoracic;  Laterality: N/A;  . Video bronchoscopy with endobronchial navigation N/A 08/06/2012    Procedure: VIDEO BRONCHOSCOPY WITH ENDOBRONCHIAL NAVIGATION;  Surgeon: Collene Gobble, MD;  Location: [place];  Service: Thoracic;  Laterality: N/A;    REVIEW OF SYSTEMS:  Constitutional: positive for fatigue and weight loss Eyes: negative Ears, nose, mouth, throat, and face: negative Respiratory: positive for dyspnea on exertion Cardiovascular: negative Gastrointestinal: negative Genitourinary:negative Integument/breast: negative Hematologic/lymphatic: negative Musculoskeletal:positive for muscle weakness Neurological: negative Behavioral/Psych: negative Endocrine: negative Allergic/Immunologic: negative   PHYSICAL EXAMINATION: General appearance: alert, cooperative, fatigued and no  distress Head: Normocephalic, without obvious abnormality, atraumatic Neck: no adenopathy Lymph nodes: Cervical, supraclavicular, and axillary nodes normal. Resp: clear to auscultation bilaterally Back: symmetric, no curvature. ROM normal. No CVA tenderness. Cardio: regular rate and rhythm, S1, S2 normal, no murmur, click, rub or gallop GI: soft, non-tender; bowel sounds normal; no masses,  no organomegaly Extremities: extremities normal, atraumatic, no cyanosis or edema Neurologic: Alert and oriented X 3, normal strength and tone. Normal symmetric reflexes. Normal coordination and gait  ECOG PERFORMANCE STATUS: 2 - Symptomatic, <50% confined to bed  Blood pressure 160/84, pulse 65, temperature 97.9 F (36.6 C), temperature source Oral, resp. rate 18, height '5\' 3"'$  (1.6 m), weight 112 lb 6.4 oz (50.984 kg), SpO2 97 %.  LABORATORY DATA: Lab Results  Component Value Date  WBC 3.4* 07/20/2015   HGB 13.4 07/20/2015   HCT 39.0 07/20/2015   MCV 102.7* 07/20/2015   PLT 202 07/20/2015      Chemistry      Component Value Date/Time   NA 132* 07/20/2015 0930   NA 136* 06/12/2013 0503   K 3.3* 07/20/2015 0930   K 3.4* 06/12/2013 0503   CL 98 06/12/2013 0503   CL 102 04/01/2012 0805   CO2 32* 07/20/2015 0930   CO2 25 06/12/2013 0503   BUN 10.0 07/20/2015 0930   BUN 5* 06/12/2013 0503   CREATININE 0.8 07/20/2015 0930   CREATININE 0.79 06/12/2013 0503      Component Value Date/Time   CALCIUM 9.7 07/20/2015 0930   CALCIUM 8.9 06/12/2013 0503   ALKPHOS 88 07/20/2015 0930   ALKPHOS 94 06/09/2013 2230   AST 28 07/20/2015 0930   AST 23 06/09/2013 2230   ALT 17 07/20/2015 0930   ALT 17 06/09/2013 2230   BILITOT 0.97 07/20/2015 0930   BILITOT 0.9 06/09/2013 2230       RADIOGRAPHIC STUDIES: Ct Abdomen Pelvis Wo Contrast  07/20/2015  CLINICAL DATA:  Non-Hodgkin' s lymphoma and right renal cancer with prior right nephrectomy, prior chemotherapy, ongoing oral chemotherapy, and change  in bowel habits recently. EXAM: CT CHEST, ABDOMEN AND PELVIS WITHOUT CONTRAST TECHNIQUE: Multidetector CT imaging of the chest, abdomen and pelvis was performed following the standard protocol without IV contrast. COMPARISON:  02/14/2015 FINDINGS: CT CHEST FINDINGS Cardiovascular: Coronary, aortic arch, and branch vessel atherosclerotic vascular disease. Ascending aortic aneurysm 4.1 cm diameter, stable. Right Port-A-Cath tip: Cavoatrial junction. Mediastinum/Nodes: No pathologic thoracic adenopathy. Lungs/Pleura: Stable mild right middle lobe scarring. Musculoskeletal: Thoracic kyphosis.  Thoracic spondylosis. CT ABDOMEN PELVIS FINDINGS Hepatobiliary: 1.7 by 1.3 cm fluid density right hepatic lobe lesion on image 53/2 unchanged from prior. Gallbladder has a somewhat posterior orientation but appears otherwise unremarkable. Pancreas: Unremarkable for age. Spleen: Unremarkable Adrenals/Urinary Tract: Right nephrectomy. Adrenal glands unremarkable. Left kidney unremarkable in noncontrast cc S CT appearance. Stomach/Bowel: Periampullary duodenal diverticulum and adjacent transverse duodenal diverticulum. Appendix normal. Fold thickening and mild fold irregularity in right pelvic small bowel loops. Sigmoid diverticulosis. Vascular/Lymphatic: Aortoiliac atherosclerotic vascular disease. No pathologic adenopathy identified. Reproductive: Right ovary 3.6 by 2.5 cm, previously the same by my measurement, suspected cystic lesion with possible internal septation. This is a chronic appearance and was similar an on 02/07/2013. Other: No supplemental non-categorized findings. Musculoskeletal: Dextroconvex lumbar scoliosis. Degenerative arthropathy of both hips. A transitional lumbosacral vertebra is assumed to represent the S1 level. Careful correlation with this numbering strategy prior to any procedural intervention would be recommended. Degenerative retrolisthesis at L2- 3 and L3-4. Somewhat low position of the anorectal  junction suggesting pelvic floor laxity. IMPRESSION: 1. No findings of recurrent malignancy. 2. Coronary, aortic arch, and branch vessel atherosclerotic vascular disease. Aortoiliac atherosclerotic vascular disease. 4.1 cm in diameter ascending aortic aneurysm. 3. Thoracic and lumbar spondylosis and degenerative disc disease. 4. Sigmoid diverticulosis. Periampullary and transverse duodenal diverticula. 5. Mild fold thickening in accentuation in right pelvic small bowel which is probably mostly jejunum. A low-grade enteritis could cause this appearance. 6. Stable cystic lesions of the left ovary, not changed over the last several years. 7. Dextroconvex lumbar scoliosis. 8. Mild pelvic floor laxity. Electronically Signed   By: Van Clines M.D.   On: 07/20/2015 12:27   Ct Chest Wo Contrast  07/20/2015  CLINICAL DATA:  Non-Hodgkin' s lymphoma and right renal cancer with prior right nephrectomy, prior chemotherapy,  ongoing oral chemotherapy, and change in bowel habits recently. EXAM: CT CHEST, ABDOMEN AND PELVIS WITHOUT CONTRAST TECHNIQUE: Multidetector CT imaging of the chest, abdomen and pelvis was performed following the standard protocol without IV contrast. COMPARISON:  02/14/2015 FINDINGS: CT CHEST FINDINGS Cardiovascular: Coronary, aortic arch, and branch vessel atherosclerotic vascular disease. Ascending aortic aneurysm 4.1 cm diameter, stable. Right Port-A-Cath tip: Cavoatrial junction. Mediastinum/Nodes: No pathologic thoracic adenopathy. Lungs/Pleura: Stable mild right middle lobe scarring. Musculoskeletal: Thoracic kyphosis.  Thoracic spondylosis. CT ABDOMEN PELVIS FINDINGS Hepatobiliary: 1.7 by 1.3 cm fluid density right hepatic lobe lesion on image 53/2 unchanged from prior. Gallbladder has a somewhat posterior orientation but appears otherwise unremarkable. Pancreas: Unremarkable for age. Spleen: Unremarkable Adrenals/Urinary Tract: Right nephrectomy. Adrenal glands unremarkable. Left kidney  unremarkable in noncontrast cc S CT appearance. Stomach/Bowel: Periampullary duodenal diverticulum and adjacent transverse duodenal diverticulum. Appendix normal. Fold thickening and mild fold irregularity in right pelvic small bowel loops. Sigmoid diverticulosis. Vascular/Lymphatic: Aortoiliac atherosclerotic vascular disease. No pathologic adenopathy identified. Reproductive: Right ovary 3.6 by 2.5 cm, previously the same by my measurement, suspected cystic lesion with possible internal septation. This is a chronic appearance and was similar an on 02/07/2013. Other: No supplemental non-categorized findings. Musculoskeletal: Dextroconvex lumbar scoliosis. Degenerative arthropathy of both hips. A transitional lumbosacral vertebra is assumed to represent the S1 level. Careful correlation with this numbering strategy prior to any procedural intervention would be recommended. Degenerative retrolisthesis at L2- 3 and L3-4. Somewhat low position of the anorectal junction suggesting pelvic floor laxity. IMPRESSION: 1. No findings of recurrent malignancy. 2. Coronary, aortic arch, and branch vessel atherosclerotic vascular disease. Aortoiliac atherosclerotic vascular disease. 4.1 cm in diameter ascending aortic aneurysm. 3. Thoracic and lumbar spondylosis and degenerative disc disease. 4. Sigmoid diverticulosis. Periampullary and transverse duodenal diverticula. 5. Mild fold thickening in accentuation in right pelvic small bowel which is probably mostly jejunum. A low-grade enteritis could cause this appearance. 6. Stable cystic lesions of the left ovary, not changed over the last several years. 7. Dextroconvex lumbar scoliosis. 8. Mild pelvic floor laxity. Electronically Signed   By: Van Clines M.D.   On: 07/20/2015 12:27   ASSESSMENT AND PLAN: This is a very pleasant 82 years old white female with metastatic renal cell carcinoma.  She is currently on treatment with Votrient 800 mg by mouth daily status post 26  months of treatment and tolerating it fairly well.  The patient is feeling fine today with no specific complaints except for persistent fatigue. The recent CT scan of the chest, abdomen and pelvis showed no evidence for disease recurrence. I discussed the scan results with the patient and her son. I recommended for her to continue her current treatment with Votrient but I will reduce the dose further to 400 mg by mouth daily.  For the hypertension, I recommended for the patient to reconsult with her primary care physician regarding adjustment of her antihypertensive medication. For the hypokalemia, I recommended for the patient to increase her potassium rich diet. She will also has Port-A-Cath flush every 2 months. She would come back for follow-up visit in 2 months for reevaluation with repeat blood work.  She was advised to call immediately if she has any concerning symptoms in the interval. The patient voices understanding of current disease status and treatment options and is in agreement with the current care plan.  All questions were answered. The patient knows to call the clinic with any problems, questions or concerns. We can certainly see the patient much sooner if  necessary.  Disclaimer: This note was dictated with voice recognition software. Similar sounding words can inadvertently be transcribed and may not be corrected upon review.

## 2015-08-23 ENCOUNTER — Ambulatory Visit (HOSPITAL_BASED_OUTPATIENT_CLINIC_OR_DEPARTMENT_OTHER): Payer: Medicare Other

## 2015-08-23 DIAGNOSIS — H353132 Nonexudative age-related macular degeneration, bilateral, intermediate dry stage: Secondary | ICD-10-CM | POA: Diagnosis not present

## 2015-08-23 DIAGNOSIS — Z95828 Presence of other vascular implants and grafts: Secondary | ICD-10-CM | POA: Insufficient documentation

## 2015-08-23 DIAGNOSIS — C8330 Diffuse large B-cell lymphoma, unspecified site: Secondary | ICD-10-CM

## 2015-08-23 DIAGNOSIS — H35352 Cystoid macular degeneration, left eye: Secondary | ICD-10-CM | POA: Diagnosis not present

## 2015-08-23 DIAGNOSIS — H04123 Dry eye syndrome of bilateral lacrimal glands: Secondary | ICD-10-CM | POA: Diagnosis not present

## 2015-08-23 DIAGNOSIS — Z961 Presence of intraocular lens: Secondary | ICD-10-CM | POA: Diagnosis not present

## 2015-08-23 DIAGNOSIS — H1851 Endothelial corneal dystrophy: Secondary | ICD-10-CM | POA: Diagnosis not present

## 2015-08-23 DIAGNOSIS — Z452 Encounter for adjustment and management of vascular access device: Secondary | ICD-10-CM

## 2015-08-23 MED ORDER — SODIUM CHLORIDE 0.9 % IJ SOLN
10.0000 mL | INTRAMUSCULAR | Status: DC | PRN
  Administered 2015-08-23: 10 mL via INTRAVENOUS
  Filled 2015-08-23: qty 10

## 2015-08-23 MED ORDER — HEPARIN SOD (PORK) LOCK FLUSH 100 UNIT/ML IV SOLN
500.0000 [IU] | Freq: Once | INTRAVENOUS | Status: AC | PRN
  Administered 2015-08-23: 500 [IU] via INTRAVENOUS
  Filled 2015-08-23: qty 5

## 2015-09-26 ENCOUNTER — Other Ambulatory Visit (HOSPITAL_BASED_OUTPATIENT_CLINIC_OR_DEPARTMENT_OTHER): Payer: Medicare Other

## 2015-09-26 ENCOUNTER — Encounter: Payer: Self-pay | Admitting: Internal Medicine

## 2015-09-26 ENCOUNTER — Telehealth: Payer: Self-pay | Admitting: Internal Medicine

## 2015-09-26 ENCOUNTER — Ambulatory Visit (HOSPITAL_BASED_OUTPATIENT_CLINIC_OR_DEPARTMENT_OTHER): Payer: Medicare Other | Admitting: Internal Medicine

## 2015-09-26 VITALS — BP 154/68 | HR 64 | Temp 97.8°F | Resp 16 | Ht 63.0 in | Wt 116.0 lb

## 2015-09-26 DIAGNOSIS — R530 Neoplastic (malignant) related fatigue: Secondary | ICD-10-CM

## 2015-09-26 DIAGNOSIS — C641 Malignant neoplasm of right kidney, except renal pelvis: Secondary | ICD-10-CM

## 2015-09-26 DIAGNOSIS — Z5111 Encounter for antineoplastic chemotherapy: Secondary | ICD-10-CM

## 2015-09-26 DIAGNOSIS — I10 Essential (primary) hypertension: Secondary | ICD-10-CM

## 2015-09-26 DIAGNOSIS — C7800 Secondary malignant neoplasm of unspecified lung: Secondary | ICD-10-CM

## 2015-09-26 DIAGNOSIS — Z8572 Personal history of non-Hodgkin lymphomas: Secondary | ICD-10-CM

## 2015-09-26 DIAGNOSIS — C8330 Diffuse large B-cell lymphoma, unspecified site: Secondary | ICD-10-CM

## 2015-09-26 LAB — CBC WITH DIFFERENTIAL/PLATELET
BASO%: 0.3 % (ref 0.0–2.0)
BASOS ABS: 0 10*3/uL (ref 0.0–0.1)
EOS%: 1.6 % (ref 0.0–7.0)
Eosinophils Absolute: 0.1 10*3/uL (ref 0.0–0.5)
HEMATOCRIT: 34.2 % — AB (ref 34.8–46.6)
HGB: 11.4 g/dL — ABNORMAL LOW (ref 11.6–15.9)
LYMPH#: 0.7 10*3/uL — AB (ref 0.9–3.3)
LYMPH%: 18.4 % (ref 14.0–49.7)
MCH: 36.1 pg — AB (ref 25.1–34.0)
MCHC: 33.3 g/dL (ref 31.5–36.0)
MCV: 108.2 fL — ABNORMAL HIGH (ref 79.5–101.0)
MONO#: 0.5 10*3/uL (ref 0.1–0.9)
MONO%: 13.6 % (ref 0.0–14.0)
NEUT#: 2.5 10*3/uL (ref 1.5–6.5)
NEUT%: 66.1 % (ref 38.4–76.8)
Platelets: 220 10*3/uL (ref 145–400)
RBC: 3.16 10*6/uL — AB (ref 3.70–5.45)
RDW: 16.3 % — ABNORMAL HIGH (ref 11.2–14.5)
WBC: 3.8 10*3/uL — ABNORMAL LOW (ref 3.9–10.3)

## 2015-09-26 LAB — COMPREHENSIVE METABOLIC PANEL
ALT: 15 U/L (ref 0–55)
ANION GAP: 10 meq/L (ref 3–11)
AST: 20 U/L (ref 5–34)
Albumin: 3.3 g/dL — ABNORMAL LOW (ref 3.5–5.0)
Alkaline Phosphatase: 95 U/L (ref 40–150)
BUN: 17 mg/dL (ref 7.0–26.0)
CALCIUM: 9.4 mg/dL (ref 8.4–10.4)
CHLORIDE: 101 meq/L (ref 98–109)
CO2: 28 mEq/L (ref 22–29)
Creatinine: 0.8 mg/dL (ref 0.6–1.1)
EGFR: 62 mL/min/{1.73_m2} — ABNORMAL LOW (ref >90)
Glucose: 98 mg/dl (ref 70–140)
POTASSIUM: 4.1 meq/L (ref 3.5–5.1)
Sodium: 138 mEq/L (ref 136–145)
Total Bilirubin: 0.44 mg/dL (ref 0.20–1.20)
Total Protein: 6.2 g/dL — ABNORMAL LOW (ref 6.4–8.3)

## 2015-09-26 LAB — LACTATE DEHYDROGENASE: LDH: 209 U/L (ref 125–245)

## 2015-09-26 LAB — MAGNESIUM: MAGNESIUM: 2.2 mg/dL (ref 1.5–2.5)

## 2015-09-26 NOTE — Telephone Encounter (Signed)
Patient declined 11/20 flush appt. Avs report and appointment schedule given per 09/26/15 los.

## 2015-09-26 NOTE — Progress Notes (Signed)
[place] Telephone:([phone]   Fax:(336) [phone]  OFFICE PROGRESS NOTE  Melissa Coma, MD [address] [place]  [address]  DIAGNOSIS:  #1 non-Hodgkin's lymphoma diagnosed on bone marrow biopsy in June of 2012  #2 metastatic renal cell carcinoma initially diagnosed as stage III (T3a N0M0) renal cell carcinoma diagnosed in June of 2012 with pulmonary metastasis diagnosed in August of 2014.  PRIOR THERAPY:  1) Status post right nephrectomy under the care of Dr. Diona Fanti on 09/02/2010.  2) Systemic chemotherapy with CHOP/Rituxan given every 3 weeks, status post 6 cycles.   CURRENT THERAPY: Votrient 800 mg by mouth daily. Status post 28 months of treatment. This will be changed to Votrient 600 mg by mouth daily between 04/25/2015 until 07/26/2015. This was further reduced to 400 mg by mouth daily starting 07/27/2015 secondary to fatigue.  INTERVAL HISTORY: Melissa Cross 82 y.o. female returns to the clinic today for two-month followup visit accompanied by her son, [name]. The patient is feeling fine today with no specific complaints except for persistent fatigue and mild alopecia. She is currently on reduced dose of Votrient 400 mg by mouth daily started 07/27/2015. She denied having any significant night sweats but she lost 3 pounds since her last visit. The patient denied having any chest pain, shortness of breath, cough or hemoptysis. She has no nausea or vomiting. The patient denied having any fever or chills. She had repeat CBC, comprehensive metabolic panel and LDH and she is here today for evaluation and discussion of her lab results.  MEDICAL HISTORY: Past Medical History:  Diagnosis Date  . Anemia   . Arthritis    knee /hip  . Blood transfusion   . Cancer ([place])    kidney cancer, NHL   . Cancer [facility])    last chemo 11/04/10   . Cataract    right  . Glaucoma   . Headache(784.0)    hx of migraines  . History of bladder  infections   . History of blood transfusion   . History of shingles   . Hypercholesteremia    takes Zocor daily  . Hypertension    takes Nadolol daily  . Hypothyroid   . Hypothyroidism    takes Synthroid daily  . Non Hodgkin's lymphoma ([place])   . Peripheral neuropathy ([place])   . PONV (postoperative nausea and vomiting)   . Renal cell carcinoma   . Scoliosis   . Shortness of breath    with exertion  . Stroke ([place])    hx of TIA  . Thrush 12/17/2010  . TIA (transient ischemic attack)     ALLERGIES:  is allergic to crab [shellfish allergy] and dilaudid [hydromorphone hcl].  MEDICATIONS:  Current Outpatient Prescriptions  Medication Sig Dispense Refill  . amLODipine (NORVASC) 5 MG tablet Take 1 tablet (5 mg total) by mouth daily. 30 tablet 0  . amoxicillin-clavulanate (AUGMENTIN) 500-125 MG tablet TK 1 T PO BID  0  . aspirin EC 81 MG tablet Take 81 mg by mouth at bedtime.     . Calcium Citrate (CITRACAL PO) Take 1 tablet by mouth 3 (three) times a week.     . Calcium-Magnesium-Vitamin D (CITRACAL CALCIUM+D) 600-40-500 MG-MG-UNIT TB24 Take 630 mg by mouth.    . cephALEXin (KEFLEX) 500 MG capsule TK 1 C PO TID UNTIL GONE  0  . cholecalciferol (VITAMIN D) 400 UNITS TABS Take 400 Units by mouth daily.      [name]  levothyroxine (SYNTHROID, LEVOTHROID) 50 MCG tablet Take 50 mcg by mouth every morning.     [name] lisinopril-hydrochlorothiazide (PRINZIDE,ZESTORETIC) 10-12.5 MG tablet TK 1 T PO QD  5  . Multiple Vitamin (MULITIVITAMIN WITH MINERALS) TABS Take 1 tablet by mouth daily.      . nadolol (CORGARD) 40 MG tablet Take 40 mg by mouth at bedtime.     . pazopanib (VOTRIENT) 200 MG tablet Take 4 tablets (800 mg total) by mouth daily. Take on an empty stomach. 120 tablet 11  . phenazopyridine (PYRIDIUM) 200 MG tablet TK 1 T PO Q 8 H PRN  0  . prednisoLONE acetate (PRED FORTE) 1 % ophthalmic suspension 1 drop.    [name] sulfamethoxazole-trimethoprim (BACTRIM DS,SEPTRA DS) 800-160 MG tablet TK 1 T PO BID   0  . timolol (BETIMOL) 0.5 % ophthalmic solution Place 1 drop into the left eye 2 (two) times daily.    . timolol (TIMOPTIC) 0.5 % ophthalmic solution 1 drop.  10  . UNABLE TO FIND Walker  Use with ambulation 1 each 1  . vitamin B-12 (CYANOCOBALAMIN) 1000 MCG tablet Place 1,000 mcg under the tongue daily.      No current facility-administered medications for this visit.    Facility-Administered Medications Ordered in Other Visits  Medication Dose Route Frequency Provider Last Rate Last Dose  . sodium chloride 0.9 % injection 10 mL  10 mL Intravenous PRN Curt Bears, MD   10 mL at 05/17/12 0846    SURGICAL HISTORY:  Past Surgical History:  Procedure Laterality Date  . left cataract removed    . MOUTH SURGERY    . NEPHRECTOMY     Right Side  . PORTACATH PLACEMENT     right subclavian  . TONSILLECTOMY     as a child  . VIDEO BRONCHOSCOPY WITH ENDOBRONCHIAL NAVIGATION N/A 08/06/2012   Procedure: VIDEO BRONCHOSCOPY WITH ENDOBRONCHIAL NAVIGATION;  Surgeon: Collene Gobble, MD;  Location: [place];  Service: Thoracic;  Laterality: N/A;  . VIDEO BRONCHOSCOPY WITH ENDOBRONCHIAL ULTRASOUND N/A 08/06/2012   Procedure: VIDEO BRONCHOSCOPY WITH ENDOBRONCHIAL ULTRASOUND;  Surgeon: Collene Gobble, MD;  Location: [place];  Service: Thoracic;  Laterality: N/A;    REVIEW OF SYSTEMS:  A comprehensive review of systems was negative except for: Constitutional: positive for fatigue Musculoskeletal: positive for muscle weakness   PHYSICAL EXAMINATION: General appearance: alert, cooperative, fatigued and no distress Head: Normocephalic, without obvious abnormality, atraumatic Neck: no adenopathy Lymph nodes: Cervical, supraclavicular, and axillary nodes normal. Resp: clear to auscultation bilaterally Back: symmetric, no curvature. ROM normal. No CVA tenderness. Cardio: regular rate and rhythm, S1, S2 normal, no murmur, click, rub or gallop GI: soft, non-tender; bowel sounds normal; no masses,  no  organomegaly Extremities: extremities normal, atraumatic, no cyanosis or edema Neurologic: Alert and oriented X 3, normal strength and tone. Normal symmetric reflexes. Normal coordination and gait  ECOG PERFORMANCE STATUS: 2 - Symptomatic, <50% confined to bed  Blood pressure (!) 154/68, pulse 64, temperature 97.8 F (36.6 C), temperature source Oral, resp. rate 16, height '5\' 3"'$  (1.6 m), weight 116 lb (52.6 kg), SpO2 100 %.  LABORATORY DATA: Lab Results  Component Value Date   WBC 3.8 (L) 09/26/2015   HGB 11.4 (L) 09/26/2015   HCT 34.2 (L) 09/26/2015   MCV 108.2 (H) 09/26/2015   PLT 220 09/26/2015      Chemistry      Component Value Date/Time   NA 132 (L) 07/20/2015 0930   K 3.3 (L) 07/20/2015  0930   CL 98 06/12/2013 0503   CL 102 04/01/2012 0805   CO2 32 (H) 07/20/2015 0930   BUN 10.0 07/20/2015 0930   CREATININE 0.8 07/20/2015 0930      Component Value Date/Time   CALCIUM 9.7 07/20/2015 0930   ALKPHOS 88 07/20/2015 0930   AST 28 07/20/2015 0930   ALT 17 07/20/2015 0930   BILITOT 0.97 07/20/2015 0930       RADIOGRAPHIC STUDIES: No results found. ASSESSMENT AND PLAN: This is a very pleasant 82 years old white female with metastatic renal cell carcinoma.  She is currently on treatment with Votrient 800 mg by mouth daily status post 26 months of treatment and tolerating it fairly well.  The patient is feeling fine today with no specific complaints except for persistent fatigue. She is currently on Votrient 400 mg by mouth daily and tolerating it well.  CBC today showed mild anemia. Compressive metabolic panel is still pending. I recommended for the patient to continue her treatment with Votrient as a scheduled. For the hypertension, I recommended for the patient to reconsult with her primary care physician regarding adjustment of her antihypertensive medication. She will also has Port-A-Cath flush every 2 months with repeat CBC incompetence metabolic panel with the next  Port-A-Cath flush. She would come back for follow-up visit in 2 months for reevaluation with repeat blood work including anemia panel  She was advised to call immediately if she has any concerning symptoms in the interval. The patient voices understanding of current disease status and treatment options and is in agreement with the current care plan.  All questions were answered. The patient knows to call the clinic with any problems, questions or concerns. We can certainly see the patient much sooner if necessary.  Disclaimer: This note was dictated with voice recognition software. Similar sounding words can inadvertently be transcribed and may not be corrected upon review.

## 2015-09-28 DIAGNOSIS — D539 Nutritional anemia, unspecified: Secondary | ICD-10-CM | POA: Diagnosis not present

## 2015-09-28 DIAGNOSIS — D649 Anemia, unspecified: Secondary | ICD-10-CM | POA: Diagnosis not present

## 2015-09-28 DIAGNOSIS — E871 Hypo-osmolality and hyponatremia: Secondary | ICD-10-CM | POA: Diagnosis not present

## 2015-09-28 DIAGNOSIS — E039 Hypothyroidism, unspecified: Secondary | ICD-10-CM | POA: Diagnosis not present

## 2015-09-28 DIAGNOSIS — I10 Essential (primary) hypertension: Secondary | ICD-10-CM | POA: Diagnosis not present

## 2015-10-05 DIAGNOSIS — D649 Anemia, unspecified: Secondary | ICD-10-CM | POA: Diagnosis not present

## 2015-10-24 ENCOUNTER — Other Ambulatory Visit (HOSPITAL_BASED_OUTPATIENT_CLINIC_OR_DEPARTMENT_OTHER): Payer: Medicare Other

## 2015-10-24 ENCOUNTER — Ambulatory Visit (HOSPITAL_BASED_OUTPATIENT_CLINIC_OR_DEPARTMENT_OTHER): Payer: Medicare Other

## 2015-10-24 DIAGNOSIS — Z95828 Presence of other vascular implants and grafts: Secondary | ICD-10-CM

## 2015-10-24 DIAGNOSIS — H04123 Dry eye syndrome of bilateral lacrimal glands: Secondary | ICD-10-CM | POA: Diagnosis not present

## 2015-10-24 DIAGNOSIS — C8330 Diffuse large B-cell lymphoma, unspecified site: Secondary | ICD-10-CM

## 2015-10-24 DIAGNOSIS — H1851 Endothelial corneal dystrophy: Secondary | ICD-10-CM | POA: Diagnosis not present

## 2015-10-24 DIAGNOSIS — C641 Malignant neoplasm of right kidney, except renal pelvis: Secondary | ICD-10-CM

## 2015-10-24 DIAGNOSIS — Z961 Presence of intraocular lens: Secondary | ICD-10-CM | POA: Diagnosis not present

## 2015-10-24 DIAGNOSIS — Z5111 Encounter for antineoplastic chemotherapy: Secondary | ICD-10-CM

## 2015-10-24 DIAGNOSIS — I10 Essential (primary) hypertension: Secondary | ICD-10-CM

## 2015-10-24 LAB — COMPREHENSIVE METABOLIC PANEL
ALT: 11 U/L (ref 0–55)
ANION GAP: 9 meq/L (ref 3–11)
AST: 20 U/L (ref 5–34)
Albumin: 3.3 g/dL — ABNORMAL LOW (ref 3.5–5.0)
Alkaline Phosphatase: 97 U/L (ref 40–150)
BUN: 15.4 mg/dL (ref 7.0–26.0)
CALCIUM: 9.3 mg/dL (ref 8.4–10.4)
CHLORIDE: 99 meq/L (ref 98–109)
CO2: 27 mEq/L (ref 22–29)
CREATININE: 0.8 mg/dL (ref 0.6–1.1)
EGFR: 62 mL/min/{1.73_m2} — AB (ref >90)
Glucose: 127 mg/dl (ref 70–140)
POTASSIUM: 3.7 meq/L (ref 3.5–5.1)
Sodium: 136 mEq/L (ref 136–145)
Total Bilirubin: 0.58 mg/dL (ref 0.20–1.20)
Total Protein: 6 g/dL — ABNORMAL LOW (ref 6.4–8.3)

## 2015-10-24 LAB — CBC WITH DIFFERENTIAL/PLATELET
BASO%: 0.2 % (ref 0.0–2.0)
BASOS ABS: 0 10*3/uL (ref 0.0–0.1)
EOS ABS: 0.1 10*3/uL (ref 0.0–0.5)
EOS%: 1.2 % (ref 0.0–7.0)
HEMATOCRIT: 36.8 % (ref 34.8–46.6)
HGB: 12.3 g/dL (ref 11.6–15.9)
LYMPH#: 0.9 10*3/uL (ref 0.9–3.3)
LYMPH%: 18.5 % (ref 14.0–49.7)
MCH: 35.1 pg — AB (ref 25.1–34.0)
MCHC: 33.4 g/dL (ref 31.5–36.0)
MCV: 105.1 fL — AB (ref 79.5–101.0)
MONO#: 0.4 10*3/uL (ref 0.1–0.9)
MONO%: 8 % (ref 0.0–14.0)
NEUT#: 3.6 10*3/uL (ref 1.5–6.5)
NEUT%: 72.1 % (ref 38.4–76.8)
PLATELETS: 251 10*3/uL (ref 145–400)
RBC: 3.5 10*6/uL — ABNORMAL LOW (ref 3.70–5.45)
RDW: 15.2 % — ABNORMAL HIGH (ref 11.2–14.5)
WBC: 5 10*3/uL (ref 3.9–10.3)

## 2015-10-24 MED ORDER — HEPARIN SOD (PORK) LOCK FLUSH 100 UNIT/ML IV SOLN
500.0000 [IU] | Freq: Once | INTRAVENOUS | Status: AC | PRN
  Administered 2015-10-24: 500 [IU] via INTRAVENOUS
  Filled 2015-10-24: qty 5

## 2015-10-24 MED ORDER — SODIUM CHLORIDE 0.9 % IJ SOLN
10.0000 mL | INTRAMUSCULAR | Status: DC | PRN
  Administered 2015-10-24: 10 mL via INTRAVENOUS
  Filled 2015-10-24: qty 10

## 2015-11-01 ENCOUNTER — Telehealth: Payer: Self-pay | Admitting: Medical Oncology

## 2015-11-01 NOTE — Telephone Encounter (Signed)
Can she get a flu vaccine. I called pt and told her per [facility] she can get a flu vaccine.

## 2015-11-03 DIAGNOSIS — Z23 Encounter for immunization: Secondary | ICD-10-CM | POA: Diagnosis not present

## 2015-11-26 ENCOUNTER — Telehealth: Payer: Self-pay | Admitting: Internal Medicine

## 2015-11-26 ENCOUNTER — Other Ambulatory Visit: Payer: Self-pay | Admitting: Medical Oncology

## 2015-11-26 ENCOUNTER — Other Ambulatory Visit (HOSPITAL_BASED_OUTPATIENT_CLINIC_OR_DEPARTMENT_OTHER): Payer: Medicare Other

## 2015-11-26 ENCOUNTER — Ambulatory Visit (HOSPITAL_BASED_OUTPATIENT_CLINIC_OR_DEPARTMENT_OTHER): Payer: Medicare Other | Admitting: Internal Medicine

## 2015-11-26 ENCOUNTER — Encounter: Payer: Self-pay | Admitting: Internal Medicine

## 2015-11-26 VITALS — BP 186/74 | HR 65 | Temp 97.9°F | Resp 17 | Ht 63.0 in | Wt 116.8 lb

## 2015-11-26 DIAGNOSIS — C8330 Diffuse large B-cell lymphoma, unspecified site: Secondary | ICD-10-CM | POA: Diagnosis not present

## 2015-11-26 DIAGNOSIS — Z5111 Encounter for antineoplastic chemotherapy: Secondary | ICD-10-CM

## 2015-11-26 DIAGNOSIS — I10 Essential (primary) hypertension: Secondary | ICD-10-CM

## 2015-11-26 DIAGNOSIS — C641 Malignant neoplasm of right kidney, except renal pelvis: Secondary | ICD-10-CM

## 2015-11-26 DIAGNOSIS — Z8572 Personal history of non-Hodgkin lymphomas: Secondary | ICD-10-CM

## 2015-11-26 DIAGNOSIS — R530 Neoplastic (malignant) related fatigue: Secondary | ICD-10-CM | POA: Diagnosis not present

## 2015-11-26 LAB — COMPREHENSIVE METABOLIC PANEL
ALT: 14 U/L (ref 0–55)
AST: 24 U/L (ref 5–34)
Albumin: 3.4 g/dL — ABNORMAL LOW (ref 3.5–5.0)
Alkaline Phosphatase: 113 U/L (ref 40–150)
Anion Gap: 9 mEq/L (ref 3–11)
BUN: 19.2 mg/dL (ref 7.0–26.0)
CHLORIDE: 100 meq/L (ref 98–109)
CO2: 30 meq/L — AB (ref 22–29)
Calcium: 9.6 mg/dL (ref 8.4–10.4)
Creatinine: 0.9 mg/dL (ref 0.6–1.1)
EGFR: 59 mL/min/{1.73_m2} — AB (ref >90)
GLUCOSE: 81 mg/dL (ref 70–140)
POTASSIUM: 3.9 meq/L (ref 3.5–5.1)
SODIUM: 139 meq/L (ref 136–145)
TOTAL PROTEIN: 6.6 g/dL (ref 6.4–8.3)
Total Bilirubin: 0.51 mg/dL (ref 0.20–1.20)

## 2015-11-26 LAB — CBC WITH DIFFERENTIAL/PLATELET
BASO%: 0.8 % (ref 0.0–2.0)
Basophils Absolute: 0 10*3/uL (ref 0.0–0.1)
EOS ABS: 0.1 10*3/uL (ref 0.0–0.5)
EOS%: 1.8 % (ref 0.0–7.0)
HCT: 39.7 % (ref 34.8–46.6)
HGB: 12.9 g/dL (ref 11.6–15.9)
LYMPH%: 20.2 % (ref 14.0–49.7)
MCH: 34 pg (ref 25.1–34.0)
MCHC: 32.4 g/dL (ref 31.5–36.0)
MCV: 105 fL — AB (ref 79.5–101.0)
MONO#: 0.5 10*3/uL (ref 0.1–0.9)
MONO%: 10.7 % (ref 0.0–14.0)
NEUT%: 66.5 % (ref 38.4–76.8)
NEUTROS ABS: 2.8 10*3/uL (ref 1.5–6.5)
Platelets: 250 10*3/uL (ref 145–400)
RBC: 3.78 10*6/uL (ref 3.70–5.45)
RDW: 15.9 % — ABNORMAL HIGH (ref 11.2–14.5)
WBC: 4.2 10*3/uL (ref 3.9–10.3)
lymph#: 0.9 10*3/uL (ref 0.9–3.3)

## 2015-11-26 LAB — LACTATE DEHYDROGENASE: LDH: 210 U/L (ref 125–245)

## 2015-11-26 MED ORDER — CLONIDINE HCL 0.1 MG PO TABS
ORAL_TABLET | ORAL | Status: AC
  Filled 2015-11-26: qty 2

## 2015-11-26 MED ORDER — PAZOPANIB HCL 200 MG PO TABS: 400.0000 mg | ORAL_TABLET | Freq: Every day | ORAL | 11 refills | Status: DC

## 2015-11-26 MED ORDER — CLONIDINE HCL 0.1 MG PO TABS
0.2000 mg | ORAL_TABLET | ORAL | Status: AC
  Administered 2015-11-26: 0.2 mg via ORAL

## 2015-11-26 NOTE — Telephone Encounter (Signed)
Appointments scheduled per 11/20 LOS. Patient given AVS report and calendars with future scheduled appointments. °

## 2015-11-26 NOTE — Progress Notes (Signed)
[place] [phone]   [phone]  OFFICE PROGRESS NOTE  Melissa Coma, MD [address] [place] [address]  DIAGNOSIS:  #1 non-Hodgkin's lymphoma diagnosed on bone marrow biopsy in June of 2012  #2 metastatic renal cell carcinoma initially diagnosed as stage III (T3a N0M0) renal cell carcinoma diagnosed in June of 2012 with pulmonary metastasis diagnosed in August of 2014.  PRIOR THERAPY:  1) Status post right nephrectomy under the care of Dr. Diona Fanti on 09/02/2010.  2) Systemic chemotherapy with CHOP/Rituxan given every 3 weeks, status post 6 cycles.   CURRENT THERAPY: Votrient 800 mg by mouth daily. Status post 28 months of treatment. This will be changed to Votrient 600 mg by mouth daily between 04/25/2015 until 07/26/2015. This was further reduced to 400 mg by mouth daily starting 07/27/2015 secondary to fatigue.  INTERVAL HISTORY: Melissa Cross 82 y.o. female returns to the clinic today for two-month followup visit accompanied by her son, Melissa Cross. The patient is feeling fine today with no specific complaints except for persistent fatigue. She is currently on reduced dose of Votrient 400 mg by mouth daily started 07/27/2015. She is tolerating the treatment well. She continues to have uncontrolled hypertension. She was seen recently by her primary care physician and has adjustment of her blood pressure medication, but still not well-controlled. She denied having any significant weight loss or night sweats. The patient denied having any chest pain, shortness of breath, cough or hemoptysis. She has no nausea or vomiting. The patient denied having any fever or chills. She had repeat CBC, comprehensive metabolic panel and LDH and she is here today for evaluation and discussion of her lab results.  MEDICAL HISTORY: Past Medical History:  Diagnosis Date  . Anemia   . Arthritis    knee /hip  . Blood transfusion   . Cancer  ([place])    kidney cancer, NHL   . Cancer [facility])    last chemo 11/04/10   . Cataract    right  . Glaucoma   . Headache(784.0)    hx of migraines  . History of bladder infections   . History of blood transfusion   . History of shingles   . Hypercholesteremia    takes Zocor daily  . Hypertension    takes Nadolol daily  . Hypothyroid   . Hypothyroidism    takes Synthroid daily  . Non Hodgkin's lymphoma ([place])   . Peripheral neuropathy ([place])   . PONV (postoperative nausea and vomiting)   . Renal cell carcinoma   . Scoliosis   . Shortness of breath    with exertion  . Stroke ([place])    hx of TIA  . Thrush 12/17/2010  . TIA (transient ischemic attack)     ALLERGIES:  is allergic to crab [shellfish allergy] and dilaudid [hydromorphone hcl].  MEDICATIONS:  Current Outpatient Prescriptions  Medication Sig Dispense Refill  . amLODipine (NORVASC) 5 MG tablet Take 1 tablet (5 mg total) by mouth daily. 30 tablet 0  . aspirin EC 81 MG tablet Take 81 mg by mouth at bedtime.     . Calcium Citrate (CITRACAL PO) Take 1 tablet by mouth 3 (three) times a week.     . Calcium-Magnesium-Vitamin D (CITRACAL CALCIUM+D) 600-40-500 MG-MG-UNIT TB24 Take 630 mg by mouth.    . cholecalciferol (VITAMIN D) 400 UNITS TABS Take 400 Units by mouth daily.      [name] levothyroxine (SYNTHROID, LEVOTHROID) 50 MCG  tablet Take 50 mcg by mouth every morning.     [name] lisinopril-hydrochlorothiazide (PRINZIDE,ZESTORETIC) 10-12.5 MG tablet TK 1 T PO QD  5  . Multiple Vitamin (MULITIVITAMIN WITH MINERALS) TABS Take 1 tablet by mouth daily.      . nadolol (CORGARD) 40 MG tablet Take 40 mg by mouth at bedtime.     . pazopanib (VOTRIENT) 200 MG tablet Take 2 tablets (400 mg total) by mouth daily. Take on an empty stomach. 60 tablet 11  . prednisoLONE acetate (PRED FORTE) 1 % ophthalmic suspension 1 drop.    [name] sulfamethoxazole-trimethoprim (BACTRIM DS,SEPTRA DS) 800-160 MG tablet TK 1 T PO BID  0  . timolol (BETIMOL) 0.5 %  ophthalmic solution Place 1 drop into the left eye 2 (two) times daily.    . timolol (TIMOPTIC) 0.5 % ophthalmic solution 1 drop.  10  . UNABLE TO FIND Walker  Use with ambulation 1 each 1  . vitamin B-12 (CYANOCOBALAMIN) 1000 MCG tablet Place 1,000 mcg under the tongue daily.      No current facility-administered medications for this visit.    Facility-Administered Medications Ordered in Other Visits  Medication Dose Route Frequency Provider Last Rate Last Dose  . sodium chloride 0.9 % injection 10 mL  10 mL Intravenous PRN Curt Bears, MD   10 mL at 05/17/12 0846    SURGICAL HISTORY:  Past Surgical History:  Procedure Laterality Date  . left cataract removed    . MOUTH SURGERY    . NEPHRECTOMY     Right Side  . PORTACATH PLACEMENT     right subclavian  . TONSILLECTOMY     as a child  . VIDEO BRONCHOSCOPY WITH ENDOBRONCHIAL NAVIGATION N/A 08/06/2012   Procedure: VIDEO BRONCHOSCOPY WITH ENDOBRONCHIAL NAVIGATION;  Surgeon: Collene Gobble, MD;  Location: [place];  Service: Thoracic;  Laterality: N/A;  . VIDEO BRONCHOSCOPY WITH ENDOBRONCHIAL ULTRASOUND N/A 08/06/2012   Procedure: VIDEO BRONCHOSCOPY WITH ENDOBRONCHIAL ULTRASOUND;  Surgeon: Collene Gobble, MD;  Location: [place];  Service: Thoracic;  Laterality: N/A;    REVIEW OF SYSTEMS:  Constitutional: positive for fatigue Eyes: negative Ears, nose, mouth, throat, and face: negative Respiratory: negative Cardiovascular: negative Gastrointestinal: negative Genitourinary:negative Integument/breast: negative Hematologic/lymphatic: negative Musculoskeletal:positive for arthralgias and muscle weakness Neurological: negative Behavioral/Psych: negative Endocrine: negative Allergic/Immunologic: negative   PHYSICAL EXAMINATION: General appearance: alert, cooperative, fatigued and no distress Head: Normocephalic, without obvious abnormality, atraumatic Neck: no adenopathy Lymph nodes: Cervical, supraclavicular, and axillary nodes  normal. Resp: clear to auscultation bilaterally Back: symmetric, no curvature. ROM normal. No CVA tenderness. Cardio: regular rate and rhythm, S1, S2 normal, no murmur, click, rub or gallop GI: soft, non-tender; bowel sounds normal; no masses,  no organomegaly Extremities: extremities normal, atraumatic, no cyanosis or edema Neurologic: Alert and oriented X 3, normal strength and tone. Normal symmetric reflexes. Normal coordination and gait  ECOG PERFORMANCE STATUS: 2 - Symptomatic, <50% confined to bed  Blood pressure (!) 186/77, pulse 65, temperature 97.9 F (36.6 C), temperature source Oral, resp. rate 17, height _0  (1.6 m), weight 116 lb 12.8 oz (53 kg), SpO2 100 %.  LABORATORY DATA: Lab Results  Component Value Date   WBC 4.2 11/26/2015   HGB 12.9 11/26/2015   HCT 39.7 11/26/2015   MCV 105.0 (H) 11/26/2015   PLT 250 11/26/2015      Chemistry      Component Value Date/Time   NA 136 10/24/2015 1402   K 3.7 10/24/2015 1402   CL 98  06/12/2013 0503   CL 102 04/01/2012 0805   CO2 27 10/24/2015 1402   BUN 15.4 10/24/2015 1402   CREATININE 0.8 10/24/2015 1402      Component Value Date/Time   CALCIUM 9.3 10/24/2015 1402   ALKPHOS 97 10/24/2015 1402   AST 20 10/24/2015 1402   ALT 11 10/24/2015 1402   BILITOT 0.58 10/24/2015 1402       RADIOGRAPHIC STUDIES: No results found. ASSESSMENT AND PLAN: This is a very pleasant 82 years old white female with metastatic renal cell carcinoma.  She is currently on treatment with Votrient 800 mg by mouth daily status post 26 months of treatment and tolerating it fairly well.  The patient is feeling fine today with no specific complaints except for persistent fatigue. She is currently on Votrient 400 mg by mouth daily and tolerating it well.  CBC and comprehensive metabolic panel were unremarkable today. Her anemia has improved. I recommended for the patient to continue her treatment with Votrient as a scheduled. For the  hypertension, it is still very elevated today. We will give the patient clonidine 0.2 mg by mouth 1. She was advised to check her blood pressure regularly at home and if continues to be elevated to check with her primary care physician again. She will also has Port-A-Cath flush every 2 months with repeat CBC incompetence metabolic panel with the next Port-A-Cath flush. She would come back for follow-up visit in 2 months for reevaluation with repeat blood work and CT scan of the chest, abdomen and pelvis without contrast for restaging of her disease. She was advised to call immediately if she has any concerning symptoms in the interval. The patient voices understanding of current disease status and treatment options and is in agreement with the current care plan.  All questions were answered. The patient knows to call the clinic with any problems, questions or concerns. We can certainly see the patient much sooner if necessary.  Disclaimer: This note was dictated with voice recognition software. Similar sounding words can inadvertently be transcribed and may not be corrected upon review.

## 2015-11-27 LAB — IRON AND TIBC
%SAT: 26 % (ref 21–57)
Iron: 69 ug/dL (ref 41–142)
TIBC: 264 ug/dL (ref 236–444)
UIBC: 195 ug/dL (ref 120–384)

## 2015-11-27 LAB — FERRITIN: Ferritin: 137 ng/ml (ref 9–269)

## 2015-11-27 LAB — VITAMIN B12

## 2015-11-27 LAB — FOLATE

## 2015-12-06 ENCOUNTER — Encounter: Payer: Self-pay | Admitting: Internal Medicine

## 2015-12-06 NOTE — Progress Notes (Signed)
Faxed re enrollment application for [phone] along w/ required docs needed for processing to [employer] for [place] today.

## 2016-01-04 ENCOUNTER — Other Ambulatory Visit: Payer: Self-pay | Admitting: Emergency Medicine

## 2016-01-04 ENCOUNTER — Other Ambulatory Visit: Payer: Self-pay | Admitting: Internal Medicine

## 2016-01-04 DIAGNOSIS — C641 Malignant neoplasm of right kidney, except renal pelvis: Secondary | ICD-10-CM

## 2016-01-04 MED ORDER — PAZOPANIB HCL 200 MG PO TABS: 400.0000 mg | ORAL_TABLET | Freq: Every day | ORAL | 11 refills | Status: DC

## 2016-01-04 MED ORDER — PAZOPANIB HCL 200 MG PO TABS: 400.0000 mg | ORAL_TABLET | Freq: Every day | ORAL | 11 refills | Status: AC

## 2016-01-21 ENCOUNTER — Ambulatory Visit (HOSPITAL_BASED_OUTPATIENT_CLINIC_OR_DEPARTMENT_OTHER): Payer: Medicare Other

## 2016-01-21 ENCOUNTER — Other Ambulatory Visit (HOSPITAL_BASED_OUTPATIENT_CLINIC_OR_DEPARTMENT_OTHER): Payer: Medicare Other

## 2016-01-21 ENCOUNTER — Ambulatory Visit (HOSPITAL_COMMUNITY)
Admission: RE | Admit: 2016-01-21 | Discharge: 2016-01-21 | Disposition: A | Discharge status: Home / Self Care | Payer: Medicare Other | Source: Ambulatory Visit | Attending: Internal Medicine | Admitting: Internal Medicine

## 2016-01-21 VITALS — BP 160/72 | HR 72 | Temp 97.5°F | Resp 20

## 2016-01-21 DIAGNOSIS — Z5111 Encounter for antineoplastic chemotherapy: Secondary | ICD-10-CM

## 2016-01-21 DIAGNOSIS — Z905 Acquired absence of kidney: Secondary | ICD-10-CM | POA: Diagnosis not present

## 2016-01-21 DIAGNOSIS — C641 Malignant neoplasm of right kidney, except renal pelvis: Secondary | ICD-10-CM | POA: Insufficient documentation

## 2016-01-21 DIAGNOSIS — C8590 Non-Hodgkin lymphoma, unspecified, unspecified site: Secondary | ICD-10-CM | POA: Diagnosis not present

## 2016-01-21 DIAGNOSIS — I10 Essential (primary) hypertension: Secondary | ICD-10-CM

## 2016-01-21 DIAGNOSIS — C8330 Diffuse large B-cell lymphoma, unspecified site: Secondary | ICD-10-CM

## 2016-01-21 DIAGNOSIS — Z95828 Presence of other vascular implants and grafts: Secondary | ICD-10-CM

## 2016-01-21 LAB — CBC WITH DIFFERENTIAL/PLATELET
BASO%: 0.7 % (ref 0.0–2.0)
BASOS ABS: 0 10*3/uL (ref 0.0–0.1)
EOS%: 1.8 % (ref 0.0–7.0)
Eosinophils Absolute: 0.1 10*3/uL (ref 0.0–0.5)
HEMATOCRIT: 37.8 % (ref 34.8–46.6)
HEMOGLOBIN: 12.6 g/dL (ref 11.6–15.9)
LYMPH#: 0.6 10*3/uL — AB (ref 0.9–3.3)
LYMPH%: 14.2 % (ref 14.0–49.7)
MCH: 33.6 pg (ref 25.1–34.0)
MCHC: 33.2 g/dL (ref 31.5–36.0)
MCV: 101.1 fL — ABNORMAL HIGH (ref 79.5–101.0)
MONO#: 0.6 10*3/uL (ref 0.1–0.9)
MONO%: 14.1 % — ABNORMAL HIGH (ref 0.0–14.0)
NEUT%: 69.2 % (ref 38.4–76.8)
NEUTROS ABS: 2.9 10*3/uL (ref 1.5–6.5)
Platelets: 231 10*3/uL (ref 145–400)
RBC: 3.74 10*6/uL (ref 3.70–5.45)
RDW: 16.6 % — AB (ref 11.2–14.5)
WBC: 4.2 10*3/uL (ref 3.9–10.3)

## 2016-01-21 LAB — COMPREHENSIVE METABOLIC PANEL
ALBUMIN: 3.5 g/dL (ref 3.5–5.0)
ALK PHOS: 125 U/L (ref 40–150)
ALT: 15 U/L (ref 0–55)
AST: 24 U/L (ref 5–34)
Anion Gap: 10 mEq/L (ref 3–11)
BUN: 17.9 mg/dL (ref 7.0–26.0)
CALCIUM: 9.6 mg/dL (ref 8.4–10.4)
CO2: 29 mEq/L (ref 22–29)
CREATININE: 0.8 mg/dL (ref 0.6–1.1)
Chloride: 98 mEq/L (ref 98–109)
EGFR: 68 mL/min/{1.73_m2} — ABNORMAL LOW (ref >90)
GLUCOSE: 84 mg/dL (ref 70–140)
Potassium: 4.1 mEq/L (ref 3.5–5.1)
SODIUM: 137 meq/L (ref 136–145)
TOTAL PROTEIN: 6.7 g/dL (ref 6.4–8.3)
Total Bilirubin: 0.52 mg/dL (ref 0.20–1.20)

## 2016-01-21 LAB — LACTATE DEHYDROGENASE: LDH: 222 U/L (ref 125–245)

## 2016-01-21 MED ORDER — SODIUM CHLORIDE 0.9 % IJ SOLN
10.0000 mL | INTRAMUSCULAR | Status: DC | PRN
  Administered 2016-01-21: 10 mL via INTRAVENOUS
  Filled 2016-01-21: qty 10

## 2016-01-21 MED ORDER — HEPARIN SOD (PORK) LOCK FLUSH 100 UNIT/ML IV SOLN
500.0000 [IU] | Freq: Once | INTRAVENOUS | Status: AC | PRN
  Administered 2016-01-21: 500 [IU] via INTRAVENOUS
  Filled 2016-01-21: qty 5

## 2016-01-21 NOTE — Patient Instructions (Signed)

## 2016-01-24 ENCOUNTER — Encounter: Payer: Self-pay | Admitting: Internal Medicine

## 2016-01-24 NOTE — Progress Notes (Signed)
Pt has been approved w/ [employer] for Votrient for the remainder of the calendar year.

## 2016-01-28 ENCOUNTER — Ambulatory Visit (HOSPITAL_BASED_OUTPATIENT_CLINIC_OR_DEPARTMENT_OTHER): Payer: Medicare Other | Admitting: Internal Medicine

## 2016-01-28 ENCOUNTER — Encounter: Payer: Self-pay | Admitting: Internal Medicine

## 2016-01-28 ENCOUNTER — Telehealth: Payer: Self-pay | Admitting: Internal Medicine

## 2016-01-28 VITALS — BP 170/84 | HR 59 | Temp 98.2°F | Resp 17 | Ht 63.0 in | Wt 119.9 lb

## 2016-01-28 DIAGNOSIS — Z8572 Personal history of non-Hodgkin lymphomas: Secondary | ICD-10-CM

## 2016-01-28 DIAGNOSIS — C641 Malignant neoplasm of right kidney, except renal pelvis: Secondary | ICD-10-CM

## 2016-01-28 DIAGNOSIS — C8330 Diffuse large B-cell lymphoma, unspecified site: Secondary | ICD-10-CM

## 2016-01-28 DIAGNOSIS — I10 Essential (primary) hypertension: Secondary | ICD-10-CM | POA: Diagnosis not present

## 2016-01-28 DIAGNOSIS — Z5111 Encounter for antineoplastic chemotherapy: Secondary | ICD-10-CM

## 2016-01-28 MED ORDER — CLONIDINE HCL 0.1 MG PO TABS
0.2000 mg | ORAL_TABLET | Freq: Once | ORAL | Status: AC
  Administered 2016-01-28: 0.2 mg via ORAL

## 2016-01-28 MED ORDER — CLONIDINE HCL 0.1 MG PO TABS
ORAL_TABLET | ORAL | Status: AC
  Filled 2016-01-28: qty 1

## 2016-01-28 MED ORDER — CLONIDINE HCL 0.1 MG PO TABS
ORAL_TABLET | ORAL | Status: AC
  Filled 2016-01-28: qty 2

## 2016-01-28 NOTE — Progress Notes (Signed)
[place] [phone]   Fax:([phone]  OFFICE PROGRESS NOTE  [name], MD [address] [place] [address]  DIAGNOSIS:  #1 non-Hodgkin's lymphoma diagnosed on bone marrow biopsy in June of 2012  #2 metastatic renal cell carcinoma initially diagnosed as stage III (T3a N0M0) renal cell carcinoma diagnosed in June of 2012 with pulmonary metastasis diagnosed in August of 2014.  PRIOR THERAPY: 1) Status post right nephrectomy under the care of Dr. Diona Fanti on 09/02/2010.  2) Systemic chemotherapy with CHOP/Rituxan given every 3 weeks, status post 6 cycles.   CURRENT THERAPY: Votrient 800 mg by mouth daily. Status post 28 months of treatment. This will be changed to Votrient 600 mg by mouth daily between 04/25/2015 until 07/26/2015. This was further reduced to 400 mg by mouth daily starting 07/27/2015 secondary to fatigue.  INTERVAL HISTORY: Melissa Cross 83 y.o. female returns to the clinic today for follow-up visit accompanied by her son Melissa Cross. The patient is feeling fine today with no specific complaints except for the fatigue. Her blood pressure is still uncontrolled. She gained 2 pounds since her last visit. 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 She denied having any fever or chills. She has no nausea, vomiting, diarrhea or constipation. She denied having any chest pain, shortness of breath, cough or hemoptysis. She is currently on Votrient 400 mg by mouth daily and she is tolerating it much better. She had repeat CT scan of the chest, abdomen and pelvis performed recently and she is here for evaluation and discussion of her scan results.  MEDICAL HISTORY: Past Medical History:  Diagnosis Date  . Anemia   . Arthritis    knee /hip  . Blood transfusion   . Cancer ([place])    kidney cancer, NHL   . Cancer [facility])    last chemo 11/04/10   . Cataract    right   . Glaucoma   . Headache(784.0)    hx of migraines  . History of bladder infections   . History of blood transfusion   . History of shingles   . Hypercholesteremia    takes Zocor daily  . Hypertension    takes Nadolol daily  . Hypothyroid   . Hypothyroidism    takes Synthroid daily  . Non Hodgkin's lymphoma ([place])   . Peripheral neuropathy ([place])   . PONV (postoperative nausea and vomiting)   . Renal cell carcinoma   . Scoliosis   . Shortness of breath    with exertion  . Stroke ([place])    hx of TIA  . Thrush 12/17/2010  . TIA (transient ischemic attack)     ALLERGIES:  is allergic to crab [shellfish allergy] and dilaudid [hydromorphone hcl].  MEDICATIONS:  Current Outpatient Prescriptions  Medication Sig Dispense Refill  . amLODipine (NORVASC) 5 MG tablet Take 1 tablet (5 mg total) by mouth daily. 30 tablet 0  . aspirin EC 81 MG tablet Take 81 mg by mouth at bedtime.     . Calcium Citrate (CITRACAL PO) Take 1 tablet by mouth 3 (three) times a week.     . Calcium-Magnesium-Vitamin D (CITRACAL CALCIUM+D) 600-40-500 MG-MG-UNIT TB24 Take 630 mg by mouth.    . cholecalciferol (VITAMIN D) 400 UNITS TABS Take 400 Units by mouth daily.      [name] levothyroxine (SYNTHROID, LEVOTHROID) 50 MCG tablet Take 50 mcg by mouth every morning.     [name]  Multiple Vitamin (MULITIVITAMIN WITH MINERALS) TABS Take 1 tablet by mouth daily.      . nadolol (CORGARD) 40 MG tablet Take 40 mg by mouth at bedtime.     . pazopanib (VOTRIENT) 200 MG tablet Take 2 tablets (400 mg total) by mouth daily. Take on an empty stomach. 60 tablet 11  . prednisoLONE acetate (PRED FORTE) 1 % ophthalmic suspension 1 drop.    [name] sulfamethoxazole-trimethoprim (BACTRIM DS,SEPTRA DS) 800-160 MG tablet TK 1 T PO BID  0  . timolol (BETIMOL) 0.5 % ophthalmic solution Place 1 drop into the left eye 2 (two) times daily.    . timolol (TIMOPTIC) 0.5 % ophthalmic solution 1 drop.  10  . UNABLE TO FIND Walker  Use with ambulation 1 each 1    . vitamin B-12 (CYANOCOBALAMIN) 1000 MCG tablet Place 1,000 mcg under the tongue daily.      No current facility-administered medications for this visit.    Facility-Administered Medications Ordered in Other Visits  Medication Dose Route Frequency Provider Last Rate Last Dose  . sodium chloride 0.9 % injection 10 mL  10 mL Intravenous PRN Curt Bears, MD   10 mL at 05/17/12 0846    SURGICAL HISTORY:  Past Surgical History:  Procedure Laterality Date  . left cataract removed    . MOUTH SURGERY    . NEPHRECTOMY     Right Side  . PORTACATH PLACEMENT     right subclavian  . TONSILLECTOMY     as a child  . VIDEO BRONCHOSCOPY WITH ENDOBRONCHIAL NAVIGATION N/A 08/06/2012   Procedure: VIDEO BRONCHOSCOPY WITH ENDOBRONCHIAL NAVIGATION;  Surgeon: Collene Gobble, MD;  Location: [facility];  Service: Thoracic;  Laterality: N/A;  . VIDEO BRONCHOSCOPY WITH ENDOBRONCHIAL ULTRASOUND N/A 08/06/2012   Procedure: VIDEO BRONCHOSCOPY WITH ENDOBRONCHIAL ULTRASOUND;  Surgeon: Collene Gobble, MD;  Location: [place];  Service: Thoracic;  Laterality: N/A;    REVIEW OF SYSTEMS:  Constitutional: positive for fatigue Eyes: negative Ears, nose, mouth, throat, and face: negative Respiratory: negative Cardiovascular: negative Gastrointestinal: negative Genitourinary:negative Integument/breast: negative Hematologic/lymphatic: negative Musculoskeletal:negative Neurological: negative Behavioral/Psych: negative Endocrine: negative Allergic/Immunologic: negative   PHYSICAL EXAMINATION: General appearance: alert, cooperative, fatigued and no distress Head: Normocephalic, without obvious abnormality, atraumatic Neck: no adenopathy, no JVD, supple, symmetrical, trachea midline and thyroid not enlarged, symmetric, no tenderness/mass/nodules Lymph nodes: Cervical, supraclavicular, and axillary nodes normal. Resp: clear to auscultation bilaterally Back: symmetric, no curvature. ROM normal. No CVA tenderness. Cardio:  regular rate and rhythm, S1, S2 normal, no murmur, click, rub or gallop GI: soft, non-tender; bowel sounds normal; no masses,  no organomegaly Extremities: extremities normal, atraumatic, no cyanosis or edema Neurologic: Alert and oriented X 3, normal strength and tone. Normal symmetric reflexes. Normal coordination and gait  ECOG PERFORMANCE STATUS: 1 - Symptomatic but completely ambulatory  Pulse (!) 59, temperature 98.2 F (36.8 C), temperature source Oral, resp. rate 17, height '5\' 3"'$  (1.6 m), weight 119 lb 14.4 oz (54.4 kg), SpO2 100 %.  LABORATORY DATA: Lab Results  Component Value Date   WBC 4.2 01/21/2016   HGB 12.6 01/21/2016   HCT 37.8 01/21/2016   MCV 101.1 (H) 01/21/2016   PLT 231 01/21/2016      Chemistry      Component Value Date/Time   NA 137 01/21/2016 1329   K 4.1 01/21/2016 1329   CL 98 06/12/2013 0503   CL 102 04/01/2012 0805   CO2 29 01/21/2016 1329   BUN 17.9 01/21/2016 1329  CREATININE 0.8 01/21/2016 1329      Component Value Date/Time   CALCIUM 9.6 01/21/2016 1329   ALKPHOS 125 01/21/2016 1329   AST 24 01/21/2016 1329   ALT 15 01/21/2016 1329   BILITOT 0.52 01/21/2016 1329       RADIOGRAPHIC STUDIES: Ct Abdomen Pelvis Wo Contrast  Result Date: 01/21/2016 CLINICAL DATA:  Right renal cell carcinoma. Diffuse large cell lymphoma. EXAM: CT CHEST, ABDOMEN AND PELVIS WITHOUT CONTRAST TECHNIQUE: Multidetector CT imaging of the chest, abdomen and pelvis was performed following the standard protocol without IV contrast. COMPARISON:  07/20/2015 FINDINGS: CT CHEST FINDINGS Cardiovascular: Heart is normal in size.  No pericardial effusion. Three vessel coronary atherosclerosis. Ectasia of the ascending thoracic aorta, measuring 4.0 cm. Atherosclerotic calcifications of the aortic arch. Right chest port terminates at the cavoatrial junction. Mediastinum/Nodes: No suspicious mediastinal lymphadenopathy. Visualized thyroid is unremarkable. Lungs/Pleura: Lungs are  essentially clear. Mild scarring in the right middle lobe. No suspicious pulmonary nodules. No focal consolidation. No pleural effusion or pneumothorax. Musculoskeletal: Mild degenerative changes of the thoracic spine. CT ABDOMEN PELVIS FINDINGS Hepatobiliary: Unenhanced liver is notable for 1.8 cm cyst in the right hepatic lobe (series 2/ image 55). Gallbladder is unremarkable. No intrahepatic or extrahepatic ductal dilatation. Pancreas: Within normal limits. Spleen: Within normal limits. Adrenals/Urinary Tract: Adrenal glands are within normal limits. Status post right nephrectomy. Left kidney is within normal limits. No renal, ureteral, or bladder calculi. No hydronephrosis. Bladder is within normal limits. Stomach/Bowel: Stomach is within normal limits. No evidence of bowel obstruction. Appendix is not discretely visualized. Moderate colonic stool burden. Extensive sigmoid diverticulosis, without evidence of diverticulitis. Vascular/Lymphatic: No evidence of abdominal aortic aneurysm. Atherosclerotic calcifications of the abdominal aorta and branch vessels. No suspicious abdominopelvic lymphadenopathy. Reproductive: Uterus is within normal limits. Left ovary is notable for two left ovary and cystic lesions measuring up to 2.2 cm (series 2/ image 85), grossly unchanged from the most recent prior, decreased when compared to 2015, likely benign. Other: No abdominopelvic ascites. Musculoskeletal: Mild degenerative changes of the lumbar spine. IMPRESSION: Status post right nephrectomy. No evidence of recurrent or metastatic disease. No suspicious lymphadenopathy in this patient with history of lymphoma. Spleen is normal in size. Ectasia of the ascending thoracic aorta, measuring 4.0 cm, unchanged. Additional ancillary findings as above. Electronically Signed   By: Julian Hy M.D.   On: 01/21/2016 17:25   Ct Chest Wo Contrast  Result Date: 01/21/2016 CLINICAL DATA:  Right renal cell carcinoma. Diffuse large  cell lymphoma. EXAM: CT CHEST, ABDOMEN AND PELVIS WITHOUT CONTRAST TECHNIQUE: Multidetector CT imaging of the chest, abdomen and pelvis was performed following the standard protocol without IV contrast. COMPARISON:  07/20/2015 FINDINGS: CT CHEST FINDINGS Cardiovascular: Heart is normal in size.  No pericardial effusion. Three vessel coronary atherosclerosis. Ectasia of the ascending thoracic aorta, measuring 4.0 cm. Atherosclerotic calcifications of the aortic arch. Right chest port terminates at the cavoatrial junction. Mediastinum/Nodes: No suspicious mediastinal lymphadenopathy. Visualized thyroid is unremarkable. Lungs/Pleura: Lungs are essentially clear. Mild scarring in the right middle lobe. No suspicious pulmonary nodules. No focal consolidation. No pleural effusion or pneumothorax. Musculoskeletal: Mild degenerative changes of the thoracic spine. CT ABDOMEN PELVIS FINDINGS Hepatobiliary: Unenhanced liver is notable for 1.8 cm cyst in the right hepatic lobe (series 2/ image 55). Gallbladder is unremarkable. No intrahepatic or extrahepatic ductal dilatation. Pancreas: Within normal limits. Spleen: Within normal limits. Adrenals/Urinary Tract: Adrenal glands are within normal limits. Status post right nephrectomy. Left kidney is within normal limits. No  renal, ureteral, or bladder calculi. No hydronephrosis. Bladder is within normal limits. Stomach/Bowel: Stomach is within normal limits. No evidence of bowel obstruction. Appendix is not discretely visualized. Moderate colonic stool burden. Extensive sigmoid diverticulosis, without evidence of diverticulitis. Vascular/Lymphatic: No evidence of abdominal aortic aneurysm. Atherosclerotic calcifications of the abdominal aorta and branch vessels. No suspicious abdominopelvic lymphadenopathy. Reproductive: Uterus is within normal limits. Left ovary is notable for two left ovary and cystic lesions measuring up to 2.2 cm (series 2/ image 85), grossly unchanged from  the most recent prior, decreased when compared to 2015, likely benign. Other: No abdominopelvic ascites. Musculoskeletal: Mild degenerative changes of the lumbar spine. IMPRESSION: Status post right nephrectomy. No evidence of recurrent or metastatic disease. No suspicious lymphadenopathy in this patient with history of lymphoma. Spleen is normal in size. Ectasia of the ascending thoracic aorta, measuring 4.0 cm, unchanged. Additional ancillary findings as above. Electronically Signed   By: Julian Hy M.D.   On: 01/21/2016 17:25    ASSESSMENT AND PLAN:  This is a very pleasant 83 years old white female with: 1) non-Hodgkin lymphoma diagnosed in bone marrow biopsy in June 2012 status post 6 cycles of chemotherapy with CHOP/Rituxan with complete response. She is currently on observation. 2) metastatic renal cell carcinoma initially diagnosed as stage III status post right nephrectomy and she is currently on treatment with reduced dose Votrient 400 mg by mouth daily since July 2017. She has been tolerating the lower dose much better. She had repeat CT scan of the chest, abdomen and pelvis performed recently. I personally and independently reviewed the scan and discuss the results with the patient and her son. The scan showed no evidence for disease progression. I recommended for the patient to continue her current treatment with Votrient with the same dose. 3) uncontrolled hypertension: I will give the patient a dose of clonidine 0.2 mg by mouth 1 today. She was advised to take her blood pressure medication at home and to check her blood pressure regularly. She would come back for follow-up visit in 2 months for reevaluation with repeat blood work. The patient voices understanding of current disease status and treatment options and is in agreement with the current care plan.  All questions were answered. The patient knows to call the clinic with any problems, questions or concerns. We can  certainly see the patient much sooner if necessary.  Disclaimer: This note was dictated with voice recognition software. Similar sounding words can inadvertently be transcribed and may not be corrected upon review.

## 2016-01-28 NOTE — Telephone Encounter (Signed)
Appointments scheduled per 01/28/16 los. Patient was given a copy of the appointment schedule and AVS report, per 01/28/16 los.

## 2016-02-15 DIAGNOSIS — I10 Essential (primary) hypertension: Secondary | ICD-10-CM | POA: Diagnosis not present

## 2016-02-15 DIAGNOSIS — R609 Edema, unspecified: Secondary | ICD-10-CM | POA: Diagnosis not present

## 2016-02-15 DIAGNOSIS — G5620 Lesion of ulnar nerve, unspecified upper limb: Secondary | ICD-10-CM | POA: Diagnosis not present

## 2016-02-15 DIAGNOSIS — R2689 Other abnormalities of gait and mobility: Secondary | ICD-10-CM | POA: Diagnosis not present

## 2016-02-20 DIAGNOSIS — D709 Neutropenia, unspecified: Secondary | ICD-10-CM | POA: Diagnosis not present

## 2016-02-20 DIAGNOSIS — R2689 Other abnormalities of gait and mobility: Secondary | ICD-10-CM | POA: Diagnosis not present

## 2016-02-20 DIAGNOSIS — E46 Unspecified protein-calorie malnutrition: Secondary | ICD-10-CM | POA: Diagnosis not present

## 2016-02-20 DIAGNOSIS — N183 Chronic kidney disease, stage 3 (moderate): Secondary | ICD-10-CM | POA: Diagnosis not present

## 2016-02-20 DIAGNOSIS — G5620 Lesion of ulnar nerve, unspecified upper limb: Secondary | ICD-10-CM | POA: Diagnosis not present

## 2016-02-20 DIAGNOSIS — I129 Hypertensive chronic kidney disease with stage 1 through stage 4 chronic kidney disease, or unspecified chronic kidney disease: Secondary | ICD-10-CM | POA: Diagnosis not present

## 2016-02-22 DIAGNOSIS — N183 Chronic kidney disease, stage 3 (moderate): Secondary | ICD-10-CM | POA: Diagnosis not present

## 2016-02-22 DIAGNOSIS — R2689 Other abnormalities of gait and mobility: Secondary | ICD-10-CM | POA: Diagnosis not present

## 2016-02-22 DIAGNOSIS — I129 Hypertensive chronic kidney disease with stage 1 through stage 4 chronic kidney disease, or unspecified chronic kidney disease: Secondary | ICD-10-CM | POA: Diagnosis not present

## 2016-02-22 DIAGNOSIS — D709 Neutropenia, unspecified: Secondary | ICD-10-CM | POA: Diagnosis not present

## 2016-02-22 DIAGNOSIS — G5620 Lesion of ulnar nerve, unspecified upper limb: Secondary | ICD-10-CM | POA: Diagnosis not present

## 2016-02-22 DIAGNOSIS — E46 Unspecified protein-calorie malnutrition: Secondary | ICD-10-CM | POA: Diagnosis not present

## 2016-02-25 DIAGNOSIS — G5620 Lesion of ulnar nerve, unspecified upper limb: Secondary | ICD-10-CM | POA: Diagnosis not present

## 2016-02-25 DIAGNOSIS — D709 Neutropenia, unspecified: Secondary | ICD-10-CM | POA: Diagnosis not present

## 2016-02-25 DIAGNOSIS — R2689 Other abnormalities of gait and mobility: Secondary | ICD-10-CM | POA: Diagnosis not present

## 2016-02-25 DIAGNOSIS — N183 Chronic kidney disease, stage 3 (moderate): Secondary | ICD-10-CM | POA: Diagnosis not present

## 2016-02-25 DIAGNOSIS — E46 Unspecified protein-calorie malnutrition: Secondary | ICD-10-CM | POA: Diagnosis not present

## 2016-02-25 DIAGNOSIS — I129 Hypertensive chronic kidney disease with stage 1 through stage 4 chronic kidney disease, or unspecified chronic kidney disease: Secondary | ICD-10-CM | POA: Diagnosis not present

## 2016-02-26 DIAGNOSIS — R2689 Other abnormalities of gait and mobility: Secondary | ICD-10-CM | POA: Diagnosis not present

## 2016-02-26 DIAGNOSIS — E46 Unspecified protein-calorie malnutrition: Secondary | ICD-10-CM | POA: Diagnosis not present

## 2016-02-26 DIAGNOSIS — I129 Hypertensive chronic kidney disease with stage 1 through stage 4 chronic kidney disease, or unspecified chronic kidney disease: Secondary | ICD-10-CM | POA: Diagnosis not present

## 2016-02-26 DIAGNOSIS — D709 Neutropenia, unspecified: Secondary | ICD-10-CM | POA: Diagnosis not present

## 2016-02-26 DIAGNOSIS — N183 Chronic kidney disease, stage 3 (moderate): Secondary | ICD-10-CM | POA: Diagnosis not present

## 2016-02-26 DIAGNOSIS — G5620 Lesion of ulnar nerve, unspecified upper limb: Secondary | ICD-10-CM | POA: Diagnosis not present

## 2016-02-27 DIAGNOSIS — I129 Hypertensive chronic kidney disease with stage 1 through stage 4 chronic kidney disease, or unspecified chronic kidney disease: Secondary | ICD-10-CM | POA: Diagnosis not present

## 2016-02-27 DIAGNOSIS — R2689 Other abnormalities of gait and mobility: Secondary | ICD-10-CM | POA: Diagnosis not present

## 2016-02-27 DIAGNOSIS — E46 Unspecified protein-calorie malnutrition: Secondary | ICD-10-CM | POA: Diagnosis not present

## 2016-02-27 DIAGNOSIS — N183 Chronic kidney disease, stage 3 (moderate): Secondary | ICD-10-CM | POA: Diagnosis not present

## 2016-02-27 DIAGNOSIS — G5620 Lesion of ulnar nerve, unspecified upper limb: Secondary | ICD-10-CM | POA: Diagnosis not present

## 2016-02-27 DIAGNOSIS — D709 Neutropenia, unspecified: Secondary | ICD-10-CM | POA: Diagnosis not present

## 2016-02-29 DIAGNOSIS — G5620 Lesion of ulnar nerve, unspecified upper limb: Secondary | ICD-10-CM | POA: Diagnosis not present

## 2016-02-29 DIAGNOSIS — R2689 Other abnormalities of gait and mobility: Secondary | ICD-10-CM | POA: Diagnosis not present

## 2016-02-29 DIAGNOSIS — I129 Hypertensive chronic kidney disease with stage 1 through stage 4 chronic kidney disease, or unspecified chronic kidney disease: Secondary | ICD-10-CM | POA: Diagnosis not present

## 2016-02-29 DIAGNOSIS — N183 Chronic kidney disease, stage 3 (moderate): Secondary | ICD-10-CM | POA: Diagnosis not present

## 2016-02-29 DIAGNOSIS — E46 Unspecified protein-calorie malnutrition: Secondary | ICD-10-CM | POA: Diagnosis not present

## 2016-02-29 DIAGNOSIS — D709 Neutropenia, unspecified: Secondary | ICD-10-CM | POA: Diagnosis not present

## 2016-03-03 DIAGNOSIS — E46 Unspecified protein-calorie malnutrition: Secondary | ICD-10-CM | POA: Diagnosis not present

## 2016-03-03 DIAGNOSIS — R2689 Other abnormalities of gait and mobility: Secondary | ICD-10-CM | POA: Diagnosis not present

## 2016-03-03 DIAGNOSIS — G5620 Lesion of ulnar nerve, unspecified upper limb: Secondary | ICD-10-CM | POA: Diagnosis not present

## 2016-03-03 DIAGNOSIS — I129 Hypertensive chronic kidney disease with stage 1 through stage 4 chronic kidney disease, or unspecified chronic kidney disease: Secondary | ICD-10-CM | POA: Diagnosis not present

## 2016-03-03 DIAGNOSIS — N183 Chronic kidney disease, stage 3 (moderate): Secondary | ICD-10-CM | POA: Diagnosis not present

## 2016-03-03 DIAGNOSIS — D709 Neutropenia, unspecified: Secondary | ICD-10-CM | POA: Diagnosis not present

## 2016-03-04 DIAGNOSIS — I129 Hypertensive chronic kidney disease with stage 1 through stage 4 chronic kidney disease, or unspecified chronic kidney disease: Secondary | ICD-10-CM | POA: Diagnosis not present

## 2016-03-04 DIAGNOSIS — D709 Neutropenia, unspecified: Secondary | ICD-10-CM | POA: Diagnosis not present

## 2016-03-04 DIAGNOSIS — R2689 Other abnormalities of gait and mobility: Secondary | ICD-10-CM | POA: Diagnosis not present

## 2016-03-04 DIAGNOSIS — E46 Unspecified protein-calorie malnutrition: Secondary | ICD-10-CM | POA: Diagnosis not present

## 2016-03-04 DIAGNOSIS — N183 Chronic kidney disease, stage 3 (moderate): Secondary | ICD-10-CM | POA: Diagnosis not present

## 2016-03-04 DIAGNOSIS — G5620 Lesion of ulnar nerve, unspecified upper limb: Secondary | ICD-10-CM | POA: Diagnosis not present

## 2016-03-05 DIAGNOSIS — D709 Neutropenia, unspecified: Secondary | ICD-10-CM | POA: Diagnosis not present

## 2016-03-05 DIAGNOSIS — I129 Hypertensive chronic kidney disease with stage 1 through stage 4 chronic kidney disease, or unspecified chronic kidney disease: Secondary | ICD-10-CM | POA: Diagnosis not present

## 2016-03-05 DIAGNOSIS — N183 Chronic kidney disease, stage 3 (moderate): Secondary | ICD-10-CM | POA: Diagnosis not present

## 2016-03-05 DIAGNOSIS — R2689 Other abnormalities of gait and mobility: Secondary | ICD-10-CM | POA: Diagnosis not present

## 2016-03-05 DIAGNOSIS — E46 Unspecified protein-calorie malnutrition: Secondary | ICD-10-CM | POA: Diagnosis not present

## 2016-03-05 DIAGNOSIS — G5620 Lesion of ulnar nerve, unspecified upper limb: Secondary | ICD-10-CM | POA: Diagnosis not present

## 2016-03-07 DIAGNOSIS — N183 Chronic kidney disease, stage 3 (moderate): Secondary | ICD-10-CM | POA: Diagnosis not present

## 2016-03-07 DIAGNOSIS — I129 Hypertensive chronic kidney disease with stage 1 through stage 4 chronic kidney disease, or unspecified chronic kidney disease: Secondary | ICD-10-CM | POA: Diagnosis not present

## 2016-03-07 DIAGNOSIS — E46 Unspecified protein-calorie malnutrition: Secondary | ICD-10-CM | POA: Diagnosis not present

## 2016-03-07 DIAGNOSIS — D709 Neutropenia, unspecified: Secondary | ICD-10-CM | POA: Diagnosis not present

## 2016-03-07 DIAGNOSIS — R2689 Other abnormalities of gait and mobility: Secondary | ICD-10-CM | POA: Diagnosis not present

## 2016-03-07 DIAGNOSIS — E871 Hypo-osmolality and hyponatremia: Secondary | ICD-10-CM | POA: Diagnosis not present

## 2016-03-07 DIAGNOSIS — G5620 Lesion of ulnar nerve, unspecified upper limb: Secondary | ICD-10-CM | POA: Diagnosis not present

## 2016-03-10 DIAGNOSIS — I129 Hypertensive chronic kidney disease with stage 1 through stage 4 chronic kidney disease, or unspecified chronic kidney disease: Secondary | ICD-10-CM | POA: Diagnosis not present

## 2016-03-10 DIAGNOSIS — R2689 Other abnormalities of gait and mobility: Secondary | ICD-10-CM | POA: Diagnosis not present

## 2016-03-10 DIAGNOSIS — G5620 Lesion of ulnar nerve, unspecified upper limb: Secondary | ICD-10-CM | POA: Diagnosis not present

## 2016-03-10 DIAGNOSIS — D709 Neutropenia, unspecified: Secondary | ICD-10-CM | POA: Diagnosis not present

## 2016-03-10 DIAGNOSIS — E46 Unspecified protein-calorie malnutrition: Secondary | ICD-10-CM | POA: Diagnosis not present

## 2016-03-10 DIAGNOSIS — N183 Chronic kidney disease, stage 3 (moderate): Secondary | ICD-10-CM | POA: Diagnosis not present

## 2016-03-11 DIAGNOSIS — D709 Neutropenia, unspecified: Secondary | ICD-10-CM | POA: Diagnosis not present

## 2016-03-11 DIAGNOSIS — I129 Hypertensive chronic kidney disease with stage 1 through stage 4 chronic kidney disease, or unspecified chronic kidney disease: Secondary | ICD-10-CM | POA: Diagnosis not present

## 2016-03-11 DIAGNOSIS — G5620 Lesion of ulnar nerve, unspecified upper limb: Secondary | ICD-10-CM | POA: Diagnosis not present

## 2016-03-11 DIAGNOSIS — E46 Unspecified protein-calorie malnutrition: Secondary | ICD-10-CM | POA: Diagnosis not present

## 2016-03-11 DIAGNOSIS — R2689 Other abnormalities of gait and mobility: Secondary | ICD-10-CM | POA: Diagnosis not present

## 2016-03-11 DIAGNOSIS — N183 Chronic kidney disease, stage 3 (moderate): Secondary | ICD-10-CM | POA: Diagnosis not present

## 2016-03-12 DIAGNOSIS — R2689 Other abnormalities of gait and mobility: Secondary | ICD-10-CM | POA: Diagnosis not present

## 2016-03-12 DIAGNOSIS — I129 Hypertensive chronic kidney disease with stage 1 through stage 4 chronic kidney disease, or unspecified chronic kidney disease: Secondary | ICD-10-CM | POA: Diagnosis not present

## 2016-03-12 DIAGNOSIS — D709 Neutropenia, unspecified: Secondary | ICD-10-CM | POA: Diagnosis not present

## 2016-03-12 DIAGNOSIS — G5620 Lesion of ulnar nerve, unspecified upper limb: Secondary | ICD-10-CM | POA: Diagnosis not present

## 2016-03-12 DIAGNOSIS — E46 Unspecified protein-calorie malnutrition: Secondary | ICD-10-CM | POA: Diagnosis not present

## 2016-03-12 DIAGNOSIS — N183 Chronic kidney disease, stage 3 (moderate): Secondary | ICD-10-CM | POA: Diagnosis not present

## 2016-03-13 ENCOUNTER — Telehealth: Payer: Self-pay | Admitting: *Deleted

## 2016-03-13 DIAGNOSIS — T451X5A Adverse effect of antineoplastic and immunosuppressive drugs, initial encounter: Principal | ICD-10-CM

## 2016-03-13 DIAGNOSIS — G620 Drug-induced polyneuropathy: Secondary | ICD-10-CM

## 2016-03-13 MED ORDER — GABAPENTIN 100 MG PO CAPS: 100.0000 mg | ORAL_CAPSULE | Freq: Two times a day (BID) | ORAL | 0 refills | Status: DC

## 2016-03-13 NOTE — Telephone Encounter (Signed)
Per Julien Nordmann I told pt he is sending rx for Neurontin to pharmacy and that side effect can be dizziness and sleepiness.

## 2016-03-13 NOTE — Telephone Encounter (Signed)
She cannot unbutton her jeans to get to bathroom quickly so she leaves it unbuttoned. Pt has PT coming to home to get legs stronger so she doesn't have to use hands to push herself up. Can she take advil for the inflammation?

## 2016-03-13 NOTE — Telephone Encounter (Signed)
"  I'm on an oral chemotherapy medicine.  I was told not to take anything but Tylenol for pain.  I have serious neuropathy in my hands.  I would like permission to take Aleve.  I only have one kidney.  This has happened gradually over the past few months and getting worse.  I need use of hands to push up.  The Ulna nerve is inflamed.  Cool compresses do not help.  This nerve affects the ring finger, pinky and sends shooting pain to the palm and outside of my hand.  Return number [phone]."

## 2016-03-14 DIAGNOSIS — I129 Hypertensive chronic kidney disease with stage 1 through stage 4 chronic kidney disease, or unspecified chronic kidney disease: Secondary | ICD-10-CM | POA: Diagnosis not present

## 2016-03-14 DIAGNOSIS — G5620 Lesion of ulnar nerve, unspecified upper limb: Secondary | ICD-10-CM | POA: Diagnosis not present

## 2016-03-14 DIAGNOSIS — N183 Chronic kidney disease, stage 3 (moderate): Secondary | ICD-10-CM | POA: Diagnosis not present

## 2016-03-14 DIAGNOSIS — D709 Neutropenia, unspecified: Secondary | ICD-10-CM | POA: Diagnosis not present

## 2016-03-14 DIAGNOSIS — E46 Unspecified protein-calorie malnutrition: Secondary | ICD-10-CM | POA: Diagnosis not present

## 2016-03-14 DIAGNOSIS — R2689 Other abnormalities of gait and mobility: Secondary | ICD-10-CM | POA: Diagnosis not present

## 2016-03-17 DIAGNOSIS — G5620 Lesion of ulnar nerve, unspecified upper limb: Secondary | ICD-10-CM | POA: Diagnosis not present

## 2016-03-17 DIAGNOSIS — N183 Chronic kidney disease, stage 3 (moderate): Secondary | ICD-10-CM | POA: Diagnosis not present

## 2016-03-17 DIAGNOSIS — R2689 Other abnormalities of gait and mobility: Secondary | ICD-10-CM | POA: Diagnosis not present

## 2016-03-17 DIAGNOSIS — I129 Hypertensive chronic kidney disease with stage 1 through stage 4 chronic kidney disease, or unspecified chronic kidney disease: Secondary | ICD-10-CM | POA: Diagnosis not present

## 2016-03-17 DIAGNOSIS — E46 Unspecified protein-calorie malnutrition: Secondary | ICD-10-CM | POA: Diagnosis not present

## 2016-03-17 DIAGNOSIS — D709 Neutropenia, unspecified: Secondary | ICD-10-CM | POA: Diagnosis not present

## 2016-03-18 ENCOUNTER — Telehealth: Payer: Self-pay | Admitting: Medical Oncology

## 2016-03-18 ENCOUNTER — Telehealth: Payer: Self-pay | Admitting: Internal Medicine

## 2016-03-18 DIAGNOSIS — G5620 Lesion of ulnar nerve, unspecified upper limb: Secondary | ICD-10-CM | POA: Diagnosis not present

## 2016-03-18 DIAGNOSIS — R2689 Other abnormalities of gait and mobility: Secondary | ICD-10-CM | POA: Diagnosis not present

## 2016-03-18 DIAGNOSIS — E46 Unspecified protein-calorie malnutrition: Secondary | ICD-10-CM | POA: Diagnosis not present

## 2016-03-18 DIAGNOSIS — I129 Hypertensive chronic kidney disease with stage 1 through stage 4 chronic kidney disease, or unspecified chronic kidney disease: Secondary | ICD-10-CM | POA: Diagnosis not present

## 2016-03-18 DIAGNOSIS — N183 Chronic kidney disease, stage 3 (moderate): Secondary | ICD-10-CM | POA: Diagnosis not present

## 2016-03-18 DIAGNOSIS — D709 Neutropenia, unspecified: Secondary | ICD-10-CM | POA: Diagnosis not present

## 2016-03-18 NOTE — Telephone Encounter (Signed)
reqeusts appt change-schedule reqeust sent

## 2016-03-18 NOTE — Telephone Encounter (Signed)
Patient called and said the rescheduled appointment of 3/22 is not good for her and she need to move it to another date   Call back number is [phone]

## 2016-03-19 DIAGNOSIS — E46 Unspecified protein-calorie malnutrition: Secondary | ICD-10-CM | POA: Diagnosis not present

## 2016-03-19 DIAGNOSIS — D709 Neutropenia, unspecified: Secondary | ICD-10-CM | POA: Diagnosis not present

## 2016-03-19 DIAGNOSIS — N183 Chronic kidney disease, stage 3 (moderate): Secondary | ICD-10-CM | POA: Diagnosis not present

## 2016-03-19 DIAGNOSIS — I129 Hypertensive chronic kidney disease with stage 1 through stage 4 chronic kidney disease, or unspecified chronic kidney disease: Secondary | ICD-10-CM | POA: Diagnosis not present

## 2016-03-19 DIAGNOSIS — G5620 Lesion of ulnar nerve, unspecified upper limb: Secondary | ICD-10-CM | POA: Diagnosis not present

## 2016-03-19 DIAGNOSIS — R2689 Other abnormalities of gait and mobility: Secondary | ICD-10-CM | POA: Diagnosis not present

## 2016-03-21 ENCOUNTER — Telehealth: Payer: Self-pay | Admitting: Internal Medicine

## 2016-03-21 NOTE — Telephone Encounter (Signed)
Spoke with patient re appointments for 3/19 @ 1:30 pm.

## 2016-03-24 ENCOUNTER — Ambulatory Visit (HOSPITAL_BASED_OUTPATIENT_CLINIC_OR_DEPARTMENT_OTHER): Payer: Medicare Other | Admitting: Oncology

## 2016-03-24 ENCOUNTER — Ambulatory Visit (HOSPITAL_BASED_OUTPATIENT_CLINIC_OR_DEPARTMENT_OTHER): Payer: Medicare Other

## 2016-03-24 ENCOUNTER — Other Ambulatory Visit (HOSPITAL_BASED_OUTPATIENT_CLINIC_OR_DEPARTMENT_OTHER): Payer: Medicare Other

## 2016-03-24 ENCOUNTER — Encounter: Payer: Self-pay | Admitting: Oncology

## 2016-03-24 ENCOUNTER — Telehealth: Payer: Self-pay | Admitting: Oncology

## 2016-03-24 VITALS — BP 163/81 | HR 60 | Temp 98.1°F | Resp 17 | Ht 63.0 in | Wt 118.6 lb

## 2016-03-24 DIAGNOSIS — C8330 Diffuse large B-cell lymphoma, unspecified site: Secondary | ICD-10-CM

## 2016-03-24 DIAGNOSIS — Z95828 Presence of other vascular implants and grafts: Secondary | ICD-10-CM

## 2016-03-24 DIAGNOSIS — C8590 Non-Hodgkin lymphoma, unspecified, unspecified site: Secondary | ICD-10-CM

## 2016-03-24 DIAGNOSIS — C641 Malignant neoplasm of right kidney, except renal pelvis: Secondary | ICD-10-CM

## 2016-03-24 DIAGNOSIS — Z5111 Encounter for antineoplastic chemotherapy: Secondary | ICD-10-CM

## 2016-03-24 DIAGNOSIS — R5383 Other fatigue: Secondary | ICD-10-CM | POA: Diagnosis not present

## 2016-03-24 DIAGNOSIS — I10 Essential (primary) hypertension: Secondary | ICD-10-CM

## 2016-03-24 LAB — CBC WITH DIFFERENTIAL/PLATELET
BASO%: 0.8 % (ref 0.0–2.0)
Basophils Absolute: 0 10*3/uL (ref 0.0–0.1)
EOS%: 1.1 % (ref 0.0–7.0)
Eosinophils Absolute: 0.1 10*3/uL (ref 0.0–0.5)
HEMATOCRIT: 36.1 % (ref 34.8–46.6)
HEMOGLOBIN: 12.1 g/dL (ref 11.6–15.9)
LYMPH#: 0.6 10*3/uL — AB (ref 0.9–3.3)
LYMPH%: 12.3 % — ABNORMAL LOW (ref 14.0–49.7)
MCH: 33.3 pg (ref 25.1–34.0)
MCHC: 33.5 g/dL (ref 31.5–36.0)
MCV: 99.3 fL (ref 79.5–101.0)
MONO#: 0.6 10*3/uL (ref 0.1–0.9)
MONO%: 11.9 % (ref 0.0–14.0)
NEUT#: 3.5 10*3/uL (ref 1.5–6.5)
NEUT%: 73.9 % (ref 38.4–76.8)
Platelets: 249 10*3/uL (ref 145–400)
RBC: 3.64 10*6/uL — ABNORMAL LOW (ref 3.70–5.45)
RDW: 17.2 % — AB (ref 11.2–14.5)
WBC: 4.7 10*3/uL (ref 3.9–10.3)

## 2016-03-24 LAB — COMPREHENSIVE METABOLIC PANEL
ALT: 16 U/L (ref 0–55)
ANION GAP: 9 meq/L (ref 3–11)
AST: 21 U/L (ref 5–34)
Albumin: 3.2 g/dL — ABNORMAL LOW (ref 3.5–5.0)
Alkaline Phosphatase: 109 U/L (ref 40–150)
BUN: 20.3 mg/dL (ref 7.0–26.0)
CHLORIDE: 95 meq/L — AB (ref 98–109)
CO2: 26 meq/L (ref 22–29)
Calcium: 9 mg/dL (ref 8.4–10.4)
Creatinine: 0.9 mg/dL (ref 0.6–1.1)
EGFR: 59 mL/min/{1.73_m2} — AB (ref >90)
Glucose: 133 mg/dl (ref 70–140)
POTASSIUM: 4.4 meq/L (ref 3.5–5.1)
Sodium: 130 mEq/L — ABNORMAL LOW (ref 136–145)
Total Bilirubin: 0.51 mg/dL (ref 0.20–1.20)
Total Protein: 6.1 g/dL — ABNORMAL LOW (ref 6.4–8.3)

## 2016-03-24 LAB — LACTATE DEHYDROGENASE: LDH: 192 U/L (ref 125–245)

## 2016-03-24 MED ORDER — SODIUM CHLORIDE 0.9 % IJ SOLN
10.0000 mL | INTRAMUSCULAR | Status: DC | PRN
  Administered 2016-03-24: 10 mL via INTRAVENOUS
  Filled 2016-03-24: qty 10

## 2016-03-24 MED ORDER — GABAPENTIN 100 MG PO CAPS: 100.0000 mg | ORAL_CAPSULE | Freq: Three times a day (TID) | ORAL | 1 refills | Status: DC

## 2016-03-24 MED ORDER — HEPARIN SOD (PORK) LOCK FLUSH 100 UNIT/ML IV SOLN
500.0000 [IU] | Freq: Once | INTRAVENOUS | Status: AC | PRN
  Administered 2016-03-24: 500 [IU] via INTRAVENOUS
  Filled 2016-03-24: qty 5

## 2016-03-24 NOTE — Patient Instructions (Signed)
Implanted Port Home Guide An implanted port is a type of central line that is placed under the skin. Central lines are used to provide IV access when treatment or nutrition needs to be given through a person's veins. Implanted ports are used for long-term IV access. An implanted port may be placed because:  You need IV medicine that would be irritating to the small veins in your hands or arms.  You need long-term IV medicines, such as antibiotics.  You need IV nutrition for a long period.  You need frequent blood draws for lab tests.  You need dialysis.  Implanted ports are usually placed in the chest area, but they can also be placed in the upper arm, the abdomen, or the leg. An implanted port has two main parts:  Reservoir. The reservoir is round and will appear as a small, raised area under your skin. The reservoir is the part where a needle is inserted to give medicines or draw blood.  Catheter. The catheter is a thin, flexible tube that extends from the reservoir. The catheter is placed into a large vein. Medicine that is inserted into the reservoir goes into the catheter and then into the vein.  How will I care for my incision site? Do not get the incision site wet. Bathe or shower as directed by your health care provider. How is my port accessed? Special steps must be taken to access the port:  Before the port is accessed, a numbing cream can be placed on the skin. This helps numb the skin over the port site.  Your health care provider uses a sterile technique to access the port. ? Your health care provider must put on a mask and sterile gloves. ? The skin over your port is cleaned carefully with an antiseptic and allowed to dry. ? The port is gently pinched between sterile gloves, and a needle is inserted into the port.  Only "non-coring" port needles should be used to access the port. Once the port is accessed, a blood return should be checked. This helps ensure that the port  is in the vein and is not clogged.  If your port needs to remain accessed for a constant infusion, a clear (transparent) bandage will be placed over the needle site. The bandage and needle will need to be changed every week, or as directed by your health care provider.  Keep the bandage covering the needle clean and dry. Do not get it wet. Follow your health care provider's instructions on how to take a shower or bath while the port is accessed.  If your port does not need to stay accessed, no bandage is needed over the port.  What is flushing? Flushing helps keep the port from getting clogged. Follow your health care provider's instructions on how and when to flush the port. Ports are usually flushed with saline solution or a medicine called heparin. The need for flushing will depend on how the port is used.  If the port is used for intermittent medicines or blood draws, the port will need to be flushed: ? After medicines have been given. ? After blood has been drawn. ? As part of routine maintenance.  If a constant infusion is running, the port may not need to be flushed.  How long will my port stay implanted? The port can stay in for as long as your health care provider thinks it is needed. When it is time for the port to come out, surgery will be   done to remove it. The procedure is similar to the one performed when the port was put in. When should I seek immediate medical care? When you have an implanted port, you should seek immediate medical care if:  You notice a bad smell coming from the incision site.  You have swelling, redness, or drainage at the incision site.  You have more swelling or pain at the port site or the surrounding area.  You have a fever that is not controlled with medicine.  This information is not intended to replace advice given to you by your health care provider. Make sure you discuss any questions you have with your health care provider. Document  Released: 12/23/2004 Document Revised: 05/31/2015 Document Reviewed: 08/30/2012 Elsevier Interactive Patient Education  2017 [employer]  

## 2016-03-24 NOTE — Progress Notes (Signed)
[place] [phone]   [phone]  OFFICE PROGRESS NOTE  [name], MD [address] [place] [address]  DIAGNOSIS:  #1 non-Hodgkin's lymphoma diagnosed on bone marrow biopsy in June of 2012  #2 metastatic renal cell carcinoma initially diagnosed as stage III (T3a N0M0) renal cell carcinoma diagnosed in June of 2012 with pulmonary metastasis diagnosed in August of 2014.  PRIOR THERAPY: 1) Status post right nephrectomy under the care of Dr. Diona Fanti on 09/02/2010.  2) Systemic chemotherapy with CHOP/Rituxan given every 3 weeks, status post 6 cycles.   CURRENT THERAPY: Votrient 800 mg by mouth daily. Status post 28 months of treatment. This will be changed to Votrient 600 mg by mouth daily between 04/25/2015 until 07/26/2015. This was further reduced to 400 mg by mouth daily starting 07/27/2015 secondary to fatigue.  INTERVAL HISTORY: Melissa Cross 83 y.o. female returns to the clinic today for follow-up visit accompanied by her son Melissa Cross. The patient is feeling fine today with no specific complaints except for the fatigue. Her blood pressure is still uncontrolled. Weight is stable since her last visit. 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 Currently on gabapentin 100 mg BID and does not notice much improvement. She denied having any fever or chills. She has no nausea, vomiting, diarrhea or constipation. She denied having any chest pain, shortness of breath, cough or hemoptysis. She is currently on Votrient 400 mg by mouth daily and she is tolerating it much better. She is here for evaluation of symptoms and side effects of medication.  MEDICAL HISTORY: Past Medical History:  Diagnosis Date  . Anemia   . Arthritis    knee /hip  . Blood transfusion   . Cancer ([place])    kidney cancer, NHL   . Cancer [facility])    last chemo 11/04/10   . Cataract    right    . Glaucoma   . Headache(784.0)    hx of migraines  . History of bladder infections   . History of blood transfusion   . History of shingles   . Hypercholesteremia    takes Zocor daily  . Hypertension    takes Nadolol daily  . Hypothyroid   . Hypothyroidism    takes Synthroid daily  . Non Hodgkin's lymphoma ([place])   . Peripheral neuropathy ([place])   . PONV (postoperative nausea and vomiting)   . Renal cell carcinoma   . Scoliosis   . Shortness of breath    with exertion  . Stroke ([place])    hx of TIA  . Thrush 12/17/2010  . TIA (transient ischemic attack)     ALLERGIES:  is allergic to crab [shellfish allergy] and dilaudid [hydromorphone hcl].  MEDICATIONS:  Current Outpatient Prescriptions  Medication Sig Dispense Refill  . amLODipine (NORVASC) 5 MG tablet Take 1 tablet (5 mg total) by mouth daily. 30 tablet 0  . aspirin EC 81 MG tablet Take 81 mg by mouth at bedtime.     . Calcium Citrate (CITRACAL PO) Take 1 tablet by mouth 3 (three) times a week.     . Calcium-Magnesium-Vitamin D (CITRACAL CALCIUM+D) 600-40-500 MG-MG-UNIT TB24 Take 630 mg by mouth.    . cholecalciferol (VITAMIN D) 400 UNITS TABS Take 400 Units by mouth daily.      [name] levothyroxine (SYNTHROID, LEVOTHROID) 50 MCG tablet Take 50 mcg by mouth every morning.     [name]  Multiple Vitamin (MULITIVITAMIN WITH MINERALS) TABS Take 1 tablet by mouth daily.      . nadolol (CORGARD) 40 MG tablet Take 40 mg by mouth at bedtime.     . pazopanib (VOTRIENT) 200 MG tablet Take 2 tablets (400 mg total) by mouth daily. Take on an empty stomach. 60 tablet 11  . prednisoLONE acetate (PRED FORTE) 1 % ophthalmic suspension instill 1 drop into right eye twice a day and 1 drop into left eye once daily    . spironolactone (ALDACTONE) 25 MG tablet take 1/2 tablet by mouth once daily with food  0  . sulfamethoxazole-trimethoprim (BACTRIM DS,SEPTRA DS) 800-160 MG tablet TK 1 T PO BID  0  . timolol (BETIMOL) 0.5 % ophthalmic solution Place 1  drop into the left eye 2 (two) times daily.    . timolol (TIMOPTIC) 0.5 % ophthalmic solution 1 drop.  10  . UNABLE TO FIND [place]  Use with ambulation 1 each 1  . vitamin B-12 (CYANOCOBALAMIN) 1000 MCG tablet Place 1,000 mcg under the tongue daily.     [name] gabapentin (NEURONTIN) 100 MG capsule Take 1 capsule (100 mg total) by mouth 3 (three) times daily. 90 capsule 1   No current facility-administered medications for this visit.    Facility-Administered Medications Ordered in Other Visits  Medication Dose Route Frequency Provider Last Rate Last Dose  . sodium chloride 0.9 % injection 10 mL  10 mL Intravenous PRN Curt Bears, MD   10 mL at 05/17/12 0846    SURGICAL HISTORY:  Past Surgical History:  Procedure Laterality Date  . left cataract removed    . MOUTH SURGERY    . NEPHRECTOMY     Right Side  . PORTACATH PLACEMENT     right subclavian  . TONSILLECTOMY     as a child  . VIDEO BRONCHOSCOPY WITH ENDOBRONCHIAL NAVIGATION N/A 08/06/2012   Procedure: VIDEO BRONCHOSCOPY WITH ENDOBRONCHIAL NAVIGATION;  Surgeon: Collene Gobble, MD;  Location: [facility];  Service: Thoracic;  Laterality: N/A;  . VIDEO BRONCHOSCOPY WITH ENDOBRONCHIAL ULTRASOUND N/A 08/06/2012   Procedure: VIDEO BRONCHOSCOPY WITH ENDOBRONCHIAL ULTRASOUND;  Surgeon: Collene Gobble, MD;  Location: [facility];  Service: Thoracic;  Laterality: N/A;    REVIEW OF SYSTEMS:  Constitutional: positive for fatigue Eyes: negative Ears, nose, mouth, throat, and face: negative Respiratory: negative Cardiovascular: negative Gastrointestinal: negative Genitourinary:negative Integument/breast: negative Hematologic/lymphatic: negative Musculoskeletal:negative Neurological: negative Behavioral/Psych: negative Endocrine: negative Allergic/Immunologic: negative   PHYSICAL EXAMINATION: General appearance: alert, cooperative, fatigued and no distress Head: Normocephalic, without obvious abnormality, atraumatic Neck: no adenopathy, no JVD,  supple, symmetrical, trachea midline and thyroid not enlarged, symmetric, no tenderness/mass/nodules Lymph nodes: Cervical, supraclavicular, and axillary nodes normal. Resp: clear to auscultation bilaterally Back: symmetric, no curvature. ROM normal. No CVA tenderness. Cardio: regular rate and rhythm, S1, S2 normal, no murmur, click, rub or gallop GI: soft, non-tender; bowel sounds normal; no masses,  no organomegaly Extremities: extremities normal, atraumatic, no cyanosis or edema Neurologic: Alert and oriented X 3, normal strength and tone. Normal symmetric reflexes. Normal coordination and gait  ECOG PERFORMANCE STATUS: 1 - Symptomatic but completely ambulatory  Blood pressure (!) 163/81, pulse 60, temperature 98.1 F (36.7 C), temperature source Oral, resp. rate 17, height _0  (1.6 m), weight 118 lb 9.6 oz (53.8 kg), SpO2 100 %.  LABORATORY DATA: Lab Results  Component Value Date   WBC 4.7 03/24/2016   HGB 12.1 03/24/2016   HCT 36.1 03/24/2016   MCV 99.3 03/24/2016  PLT 249 03/24/2016      Chemistry      Component Value Date/Time   NA 130 (L) 03/24/2016 1349   K 4.4 03/24/2016 1349   CL 98 06/12/2013 0503   CL 102 04/01/2012 0805   CO2 26 03/24/2016 1349   BUN 20.3 03/24/2016 1349   CREATININE 0.9 03/24/2016 1349      Component Value Date/Time   CALCIUM 9.0 03/24/2016 1349   ALKPHOS 109 03/24/2016 1349   AST 21 03/24/2016 1349   ALT 16 03/24/2016 1349   BILITOT 0.51 03/24/2016 1349       RADIOGRAPHIC STUDIES: No results found.  ASSESSMENT AND PLAN:  This is a very pleasant 83 year old white female with: 1) non-Hodgkin lymphoma diagnosed in bone marrow biopsy in June 2012 status post 6 cycles of chemotherapy with CHOP/Rituxan with complete response. She is currently on observation. 2) metastatic renal cell carcinoma initially diagnosed as stage III status post right nephrectomy and she is currently on treatment with reduced dose Votrient 400 mg by mouth daily  since July 2017. She has been tolerating the lower dose much better. I recommended for the patient to continue her current treatment with Votrient with the same dose. 3) uncontrolled hypertension:She was advised to take her blood pressure medication routinely and to check her blood pressure regularly. 4) for her neuropathy, will increase her gabapentin to 100 mg TID.   She will come back for follow-up visit in 2 months for reevaluation with repeat blood work.  The patient voices understanding of current disease status and treatment options and is in agreement with the current care plan.  All questions were answered. The patient knows to call the clinic with any problems, questions or concerns. We can certainly see the patient much sooner if necessary.  The patient was seen with Dr. Julien Nordmann.   Mikey Bussing, DNP, AGPCNP-BC, AOCNP   ADDENDUM: Hematology/Oncology Attending: I had a face to face encounter with the patient today. I recommended her care plan. This is a very pleasant 83 years old white female with metastatic renal cell carcinoma as well as history of non-Hodgkin lymphoma status post treatment with CHOP/Rituxan. She is currently on treatment with Votrient 400 mg by mouth daily for the metastatic renal carcinoma. She is tolerating the treatment well except for fatigue and peripheral neuropathy. She was started on treatment with gabapentin 100 mg by mouth twice a day and she did not notice much improvement in her peripheral neuropathy. I discussed with the patient increasing her dose of gabapentin gradually and she will start with 100 mg 3 times a day. We may increase the dose as needed. I will continue to monitor her closely with repeat blood work in 2 months. She was advised to call immediately if she has any concerning symptoms in the interval.  Disclaimer: This note was dictated with voice recognition software. Similar sounding words can inadvertently be transcribed and may be  missed upon review. Eilleen Kempf., MD 03/24/16

## 2016-03-24 NOTE — Telephone Encounter (Signed)
Gave patient AVS and scheduled appt per 03/24/2016 los. Lab/flush/MD in May

## 2016-03-25 DIAGNOSIS — I129 Hypertensive chronic kidney disease with stage 1 through stage 4 chronic kidney disease, or unspecified chronic kidney disease: Secondary | ICD-10-CM | POA: Diagnosis not present

## 2016-03-25 DIAGNOSIS — R2689 Other abnormalities of gait and mobility: Secondary | ICD-10-CM | POA: Diagnosis not present

## 2016-03-25 DIAGNOSIS — N183 Chronic kidney disease, stage 3 (moderate): Secondary | ICD-10-CM | POA: Diagnosis not present

## 2016-03-25 DIAGNOSIS — G5620 Lesion of ulnar nerve, unspecified upper limb: Secondary | ICD-10-CM | POA: Diagnosis not present

## 2016-03-25 DIAGNOSIS — D709 Neutropenia, unspecified: Secondary | ICD-10-CM | POA: Diagnosis not present

## 2016-03-25 DIAGNOSIS — E46 Unspecified protein-calorie malnutrition: Secondary | ICD-10-CM | POA: Diagnosis not present

## 2016-03-26 ENCOUNTER — Ambulatory Visit: Payer: Medicare Other | Admitting: Internal Medicine

## 2016-03-26 ENCOUNTER — Other Ambulatory Visit: Payer: Medicare Other

## 2016-03-26 DIAGNOSIS — N183 Chronic kidney disease, stage 3 (moderate): Secondary | ICD-10-CM | POA: Diagnosis not present

## 2016-03-26 DIAGNOSIS — G5620 Lesion of ulnar nerve, unspecified upper limb: Secondary | ICD-10-CM | POA: Diagnosis not present

## 2016-03-26 DIAGNOSIS — D709 Neutropenia, unspecified: Secondary | ICD-10-CM | POA: Diagnosis not present

## 2016-03-26 DIAGNOSIS — I129 Hypertensive chronic kidney disease with stage 1 through stage 4 chronic kidney disease, or unspecified chronic kidney disease: Secondary | ICD-10-CM | POA: Diagnosis not present

## 2016-03-26 DIAGNOSIS — E46 Unspecified protein-calorie malnutrition: Secondary | ICD-10-CM | POA: Diagnosis not present

## 2016-03-26 DIAGNOSIS — R2689 Other abnormalities of gait and mobility: Secondary | ICD-10-CM | POA: Diagnosis not present

## 2016-03-27 ENCOUNTER — Other Ambulatory Visit: Payer: Medicare Other

## 2016-03-27 ENCOUNTER — Ambulatory Visit: Payer: Medicare Other | Admitting: Internal Medicine

## 2016-03-27 DIAGNOSIS — D709 Neutropenia, unspecified: Secondary | ICD-10-CM | POA: Diagnosis not present

## 2016-03-27 DIAGNOSIS — R2689 Other abnormalities of gait and mobility: Secondary | ICD-10-CM | POA: Diagnosis not present

## 2016-03-27 DIAGNOSIS — G5620 Lesion of ulnar nerve, unspecified upper limb: Secondary | ICD-10-CM | POA: Diagnosis not present

## 2016-03-27 DIAGNOSIS — E46 Unspecified protein-calorie malnutrition: Secondary | ICD-10-CM | POA: Diagnosis not present

## 2016-03-27 DIAGNOSIS — I129 Hypertensive chronic kidney disease with stage 1 through stage 4 chronic kidney disease, or unspecified chronic kidney disease: Secondary | ICD-10-CM | POA: Diagnosis not present

## 2016-03-27 DIAGNOSIS — N183 Chronic kidney disease, stage 3 (moderate): Secondary | ICD-10-CM | POA: Diagnosis not present

## 2016-03-31 DIAGNOSIS — G5620 Lesion of ulnar nerve, unspecified upper limb: Secondary | ICD-10-CM | POA: Diagnosis not present

## 2016-03-31 DIAGNOSIS — E46 Unspecified protein-calorie malnutrition: Secondary | ICD-10-CM | POA: Diagnosis not present

## 2016-03-31 DIAGNOSIS — R2689 Other abnormalities of gait and mobility: Secondary | ICD-10-CM | POA: Diagnosis not present

## 2016-03-31 DIAGNOSIS — D709 Neutropenia, unspecified: Secondary | ICD-10-CM | POA: Diagnosis not present

## 2016-03-31 DIAGNOSIS — N183 Chronic kidney disease, stage 3 (moderate): Secondary | ICD-10-CM | POA: Diagnosis not present

## 2016-03-31 DIAGNOSIS — I129 Hypertensive chronic kidney disease with stage 1 through stage 4 chronic kidney disease, or unspecified chronic kidney disease: Secondary | ICD-10-CM | POA: Diagnosis not present

## 2016-04-02 DIAGNOSIS — D709 Neutropenia, unspecified: Secondary | ICD-10-CM | POA: Diagnosis not present

## 2016-04-02 DIAGNOSIS — I129 Hypertensive chronic kidney disease with stage 1 through stage 4 chronic kidney disease, or unspecified chronic kidney disease: Secondary | ICD-10-CM | POA: Diagnosis not present

## 2016-04-02 DIAGNOSIS — G5620 Lesion of ulnar nerve, unspecified upper limb: Secondary | ICD-10-CM | POA: Diagnosis not present

## 2016-04-02 DIAGNOSIS — E46 Unspecified protein-calorie malnutrition: Secondary | ICD-10-CM | POA: Diagnosis not present

## 2016-04-02 DIAGNOSIS — R2689 Other abnormalities of gait and mobility: Secondary | ICD-10-CM | POA: Diagnosis not present

## 2016-04-02 DIAGNOSIS — N183 Chronic kidney disease, stage 3 (moderate): Secondary | ICD-10-CM | POA: Diagnosis not present

## 2016-04-07 DIAGNOSIS — D709 Neutropenia, unspecified: Secondary | ICD-10-CM | POA: Diagnosis not present

## 2016-04-07 DIAGNOSIS — R2689 Other abnormalities of gait and mobility: Secondary | ICD-10-CM | POA: Diagnosis not present

## 2016-04-07 DIAGNOSIS — E46 Unspecified protein-calorie malnutrition: Secondary | ICD-10-CM | POA: Diagnosis not present

## 2016-04-07 DIAGNOSIS — I129 Hypertensive chronic kidney disease with stage 1 through stage 4 chronic kidney disease, or unspecified chronic kidney disease: Secondary | ICD-10-CM | POA: Diagnosis not present

## 2016-04-07 DIAGNOSIS — N183 Chronic kidney disease, stage 3 (moderate): Secondary | ICD-10-CM | POA: Diagnosis not present

## 2016-04-07 DIAGNOSIS — G5620 Lesion of ulnar nerve, unspecified upper limb: Secondary | ICD-10-CM | POA: Diagnosis not present

## 2016-04-09 DIAGNOSIS — N183 Chronic kidney disease, stage 3 (moderate): Secondary | ICD-10-CM | POA: Diagnosis not present

## 2016-04-09 DIAGNOSIS — D709 Neutropenia, unspecified: Secondary | ICD-10-CM | POA: Diagnosis not present

## 2016-04-09 DIAGNOSIS — G5620 Lesion of ulnar nerve, unspecified upper limb: Secondary | ICD-10-CM | POA: Diagnosis not present

## 2016-04-09 DIAGNOSIS — I129 Hypertensive chronic kidney disease with stage 1 through stage 4 chronic kidney disease, or unspecified chronic kidney disease: Secondary | ICD-10-CM | POA: Diagnosis not present

## 2016-04-09 DIAGNOSIS — R2689 Other abnormalities of gait and mobility: Secondary | ICD-10-CM | POA: Diagnosis not present

## 2016-04-09 DIAGNOSIS — E46 Unspecified protein-calorie malnutrition: Secondary | ICD-10-CM | POA: Diagnosis not present

## 2016-04-14 DIAGNOSIS — G5620 Lesion of ulnar nerve, unspecified upper limb: Secondary | ICD-10-CM | POA: Diagnosis not present

## 2016-04-14 DIAGNOSIS — I129 Hypertensive chronic kidney disease with stage 1 through stage 4 chronic kidney disease, or unspecified chronic kidney disease: Secondary | ICD-10-CM | POA: Diagnosis not present

## 2016-04-14 DIAGNOSIS — E46 Unspecified protein-calorie malnutrition: Secondary | ICD-10-CM | POA: Diagnosis not present

## 2016-04-14 DIAGNOSIS — D709 Neutropenia, unspecified: Secondary | ICD-10-CM | POA: Diagnosis not present

## 2016-04-14 DIAGNOSIS — R2689 Other abnormalities of gait and mobility: Secondary | ICD-10-CM | POA: Diagnosis not present

## 2016-04-14 DIAGNOSIS — N183 Chronic kidney disease, stage 3 (moderate): Secondary | ICD-10-CM | POA: Diagnosis not present

## 2016-04-16 DIAGNOSIS — E46 Unspecified protein-calorie malnutrition: Secondary | ICD-10-CM | POA: Diagnosis not present

## 2016-04-16 DIAGNOSIS — N183 Chronic kidney disease, stage 3 (moderate): Secondary | ICD-10-CM | POA: Diagnosis not present

## 2016-04-16 DIAGNOSIS — R2689 Other abnormalities of gait and mobility: Secondary | ICD-10-CM | POA: Diagnosis not present

## 2016-04-16 DIAGNOSIS — G5620 Lesion of ulnar nerve, unspecified upper limb: Secondary | ICD-10-CM | POA: Diagnosis not present

## 2016-04-16 DIAGNOSIS — I129 Hypertensive chronic kidney disease with stage 1 through stage 4 chronic kidney disease, or unspecified chronic kidney disease: Secondary | ICD-10-CM | POA: Diagnosis not present

## 2016-04-16 DIAGNOSIS — D709 Neutropenia, unspecified: Secondary | ICD-10-CM | POA: Diagnosis not present

## 2016-04-20 DIAGNOSIS — N183 Chronic kidney disease, stage 3 (moderate): Secondary | ICD-10-CM | POA: Diagnosis not present

## 2016-04-20 DIAGNOSIS — G5620 Lesion of ulnar nerve, unspecified upper limb: Secondary | ICD-10-CM | POA: Diagnosis not present

## 2016-04-20 DIAGNOSIS — E46 Unspecified protein-calorie malnutrition: Secondary | ICD-10-CM | POA: Diagnosis not present

## 2016-04-20 DIAGNOSIS — I129 Hypertensive chronic kidney disease with stage 1 through stage 4 chronic kidney disease, or unspecified chronic kidney disease: Secondary | ICD-10-CM | POA: Diagnosis not present

## 2016-04-20 DIAGNOSIS — R2689 Other abnormalities of gait and mobility: Secondary | ICD-10-CM | POA: Diagnosis not present

## 2016-04-20 DIAGNOSIS — D709 Neutropenia, unspecified: Secondary | ICD-10-CM | POA: Diagnosis not present

## 2016-04-21 DIAGNOSIS — G5620 Lesion of ulnar nerve, unspecified upper limb: Secondary | ICD-10-CM | POA: Diagnosis not present

## 2016-04-21 DIAGNOSIS — E46 Unspecified protein-calorie malnutrition: Secondary | ICD-10-CM | POA: Diagnosis not present

## 2016-04-21 DIAGNOSIS — D709 Neutropenia, unspecified: Secondary | ICD-10-CM | POA: Diagnosis not present

## 2016-04-21 DIAGNOSIS — N183 Chronic kidney disease, stage 3 (moderate): Secondary | ICD-10-CM | POA: Diagnosis not present

## 2016-04-21 DIAGNOSIS — R2689 Other abnormalities of gait and mobility: Secondary | ICD-10-CM | POA: Diagnosis not present

## 2016-04-21 DIAGNOSIS — I129 Hypertensive chronic kidney disease with stage 1 through stage 4 chronic kidney disease, or unspecified chronic kidney disease: Secondary | ICD-10-CM | POA: Diagnosis not present

## 2016-04-23 DIAGNOSIS — G5620 Lesion of ulnar nerve, unspecified upper limb: Secondary | ICD-10-CM | POA: Diagnosis not present

## 2016-04-23 DIAGNOSIS — R2689 Other abnormalities of gait and mobility: Secondary | ICD-10-CM | POA: Diagnosis not present

## 2016-04-23 DIAGNOSIS — E46 Unspecified protein-calorie malnutrition: Secondary | ICD-10-CM | POA: Diagnosis not present

## 2016-04-23 DIAGNOSIS — I129 Hypertensive chronic kidney disease with stage 1 through stage 4 chronic kidney disease, or unspecified chronic kidney disease: Secondary | ICD-10-CM | POA: Diagnosis not present

## 2016-04-23 DIAGNOSIS — D709 Neutropenia, unspecified: Secondary | ICD-10-CM | POA: Diagnosis not present

## 2016-04-23 DIAGNOSIS — N183 Chronic kidney disease, stage 3 (moderate): Secondary | ICD-10-CM | POA: Diagnosis not present

## 2016-04-28 DIAGNOSIS — G5620 Lesion of ulnar nerve, unspecified upper limb: Secondary | ICD-10-CM | POA: Diagnosis not present

## 2016-04-28 DIAGNOSIS — N183 Chronic kidney disease, stage 3 (moderate): Secondary | ICD-10-CM | POA: Diagnosis not present

## 2016-04-28 DIAGNOSIS — E46 Unspecified protein-calorie malnutrition: Secondary | ICD-10-CM | POA: Diagnosis not present

## 2016-04-28 DIAGNOSIS — I129 Hypertensive chronic kidney disease with stage 1 through stage 4 chronic kidney disease, or unspecified chronic kidney disease: Secondary | ICD-10-CM | POA: Diagnosis not present

## 2016-04-28 DIAGNOSIS — R2689 Other abnormalities of gait and mobility: Secondary | ICD-10-CM | POA: Diagnosis not present

## 2016-04-28 DIAGNOSIS — D709 Neutropenia, unspecified: Secondary | ICD-10-CM | POA: Diagnosis not present

## 2016-04-30 DIAGNOSIS — G5620 Lesion of ulnar nerve, unspecified upper limb: Secondary | ICD-10-CM | POA: Diagnosis not present

## 2016-04-30 DIAGNOSIS — R2689 Other abnormalities of gait and mobility: Secondary | ICD-10-CM | POA: Diagnosis not present

## 2016-04-30 DIAGNOSIS — D709 Neutropenia, unspecified: Secondary | ICD-10-CM | POA: Diagnosis not present

## 2016-04-30 DIAGNOSIS — E46 Unspecified protein-calorie malnutrition: Secondary | ICD-10-CM | POA: Diagnosis not present

## 2016-04-30 DIAGNOSIS — N183 Chronic kidney disease, stage 3 (moderate): Secondary | ICD-10-CM | POA: Diagnosis not present

## 2016-04-30 DIAGNOSIS — I129 Hypertensive chronic kidney disease with stage 1 through stage 4 chronic kidney disease, or unspecified chronic kidney disease: Secondary | ICD-10-CM | POA: Diagnosis not present

## 2016-05-07 DIAGNOSIS — D709 Neutropenia, unspecified: Secondary | ICD-10-CM | POA: Diagnosis not present

## 2016-05-07 DIAGNOSIS — I129 Hypertensive chronic kidney disease with stage 1 through stage 4 chronic kidney disease, or unspecified chronic kidney disease: Secondary | ICD-10-CM | POA: Diagnosis not present

## 2016-05-07 DIAGNOSIS — N183 Chronic kidney disease, stage 3 (moderate): Secondary | ICD-10-CM | POA: Diagnosis not present

## 2016-05-07 DIAGNOSIS — R2689 Other abnormalities of gait and mobility: Secondary | ICD-10-CM | POA: Diagnosis not present

## 2016-05-07 DIAGNOSIS — E46 Unspecified protein-calorie malnutrition: Secondary | ICD-10-CM | POA: Diagnosis not present

## 2016-05-07 DIAGNOSIS — G5620 Lesion of ulnar nerve, unspecified upper limb: Secondary | ICD-10-CM | POA: Diagnosis not present

## 2016-05-09 DIAGNOSIS — D709 Neutropenia, unspecified: Secondary | ICD-10-CM | POA: Diagnosis not present

## 2016-05-09 DIAGNOSIS — G5620 Lesion of ulnar nerve, unspecified upper limb: Secondary | ICD-10-CM | POA: Diagnosis not present

## 2016-05-09 DIAGNOSIS — I129 Hypertensive chronic kidney disease with stage 1 through stage 4 chronic kidney disease, or unspecified chronic kidney disease: Secondary | ICD-10-CM | POA: Diagnosis not present

## 2016-05-09 DIAGNOSIS — N183 Chronic kidney disease, stage 3 (moderate): Secondary | ICD-10-CM | POA: Diagnosis not present

## 2016-05-09 DIAGNOSIS — E46 Unspecified protein-calorie malnutrition: Secondary | ICD-10-CM | POA: Diagnosis not present

## 2016-05-09 DIAGNOSIS — R2689 Other abnormalities of gait and mobility: Secondary | ICD-10-CM | POA: Diagnosis not present

## 2016-05-12 DIAGNOSIS — G5620 Lesion of ulnar nerve, unspecified upper limb: Secondary | ICD-10-CM | POA: Diagnosis not present

## 2016-05-12 DIAGNOSIS — N183 Chronic kidney disease, stage 3 (moderate): Secondary | ICD-10-CM | POA: Diagnosis not present

## 2016-05-12 DIAGNOSIS — E46 Unspecified protein-calorie malnutrition: Secondary | ICD-10-CM | POA: Diagnosis not present

## 2016-05-12 DIAGNOSIS — I129 Hypertensive chronic kidney disease with stage 1 through stage 4 chronic kidney disease, or unspecified chronic kidney disease: Secondary | ICD-10-CM | POA: Diagnosis not present

## 2016-05-12 DIAGNOSIS — D709 Neutropenia, unspecified: Secondary | ICD-10-CM | POA: Diagnosis not present

## 2016-05-12 DIAGNOSIS — R2689 Other abnormalities of gait and mobility: Secondary | ICD-10-CM | POA: Diagnosis not present

## 2016-05-14 DIAGNOSIS — N183 Chronic kidney disease, stage 3 (moderate): Secondary | ICD-10-CM | POA: Diagnosis not present

## 2016-05-14 DIAGNOSIS — I129 Hypertensive chronic kidney disease with stage 1 through stage 4 chronic kidney disease, or unspecified chronic kidney disease: Secondary | ICD-10-CM | POA: Diagnosis not present

## 2016-05-14 DIAGNOSIS — D709 Neutropenia, unspecified: Secondary | ICD-10-CM | POA: Diagnosis not present

## 2016-05-14 DIAGNOSIS — R2689 Other abnormalities of gait and mobility: Secondary | ICD-10-CM | POA: Diagnosis not present

## 2016-05-14 DIAGNOSIS — E46 Unspecified protein-calorie malnutrition: Secondary | ICD-10-CM | POA: Diagnosis not present

## 2016-05-14 DIAGNOSIS — G5620 Lesion of ulnar nerve, unspecified upper limb: Secondary | ICD-10-CM | POA: Diagnosis not present

## 2016-05-16 ENCOUNTER — Telehealth: Payer: Self-pay | Admitting: *Deleted

## 2016-05-16 NOTE — Telephone Encounter (Signed)
"  My appointment with Dr, Julien Nordmann was rescheduled and this is not going to work.  My son is a PA and unable to come in the morning.  I need an early afternoon appointment beginning at 1:00 or 1:30 for lab,flush then followed by an appointment with Dr. Julien Nordmann.  Can I be seen Monday afternoon by a nurse practitioner instead of Dr. Julien Nordmann?  Please cancel the 10:45 am appointment and reschedule to whatever the first available early afternoon appointment is with Dr. Julien Nordmann.  I can be reached today at [phone]."

## 2016-05-19 ENCOUNTER — Other Ambulatory Visit (HOSPITAL_BASED_OUTPATIENT_CLINIC_OR_DEPARTMENT_OTHER): Payer: Medicare Other

## 2016-05-19 ENCOUNTER — Ambulatory Visit: Payer: Medicare Other | Admitting: Internal Medicine

## 2016-05-19 ENCOUNTER — Ambulatory Visit (HOSPITAL_BASED_OUTPATIENT_CLINIC_OR_DEPARTMENT_OTHER): Payer: Medicare Other | Admitting: Oncology

## 2016-05-19 ENCOUNTER — Ambulatory Visit (HOSPITAL_BASED_OUTPATIENT_CLINIC_OR_DEPARTMENT_OTHER): Payer: Medicare Other

## 2016-05-19 ENCOUNTER — Telehealth: Payer: Self-pay | Admitting: Internal Medicine

## 2016-05-19 ENCOUNTER — Encounter: Payer: Self-pay | Admitting: Oncology

## 2016-05-19 ENCOUNTER — Other Ambulatory Visit: Payer: Medicare Other

## 2016-05-19 VITALS — BP 160/75 | HR 65 | Temp 98.0°F | Resp 18 | Ht 63.0 in | Wt 116.9 lb

## 2016-05-19 DIAGNOSIS — D709 Neutropenia, unspecified: Secondary | ICD-10-CM | POA: Diagnosis not present

## 2016-05-19 DIAGNOSIS — R2689 Other abnormalities of gait and mobility: Secondary | ICD-10-CM | POA: Diagnosis not present

## 2016-05-19 DIAGNOSIS — G620 Drug-induced polyneuropathy: Secondary | ICD-10-CM

## 2016-05-19 DIAGNOSIS — Z8572 Personal history of non-Hodgkin lymphomas: Secondary | ICD-10-CM

## 2016-05-19 DIAGNOSIS — N183 Chronic kidney disease, stage 3 (moderate): Secondary | ICD-10-CM | POA: Diagnosis not present

## 2016-05-19 DIAGNOSIS — E46 Unspecified protein-calorie malnutrition: Secondary | ICD-10-CM | POA: Diagnosis not present

## 2016-05-19 DIAGNOSIS — C641 Malignant neoplasm of right kidney, except renal pelvis: Secondary | ICD-10-CM

## 2016-05-19 DIAGNOSIS — G5620 Lesion of ulnar nerve, unspecified upper limb: Secondary | ICD-10-CM | POA: Diagnosis not present

## 2016-05-19 DIAGNOSIS — I129 Hypertensive chronic kidney disease with stage 1 through stage 4 chronic kidney disease, or unspecified chronic kidney disease: Secondary | ICD-10-CM | POA: Diagnosis not present

## 2016-05-19 DIAGNOSIS — Z95828 Presence of other vascular implants and grafts: Secondary | ICD-10-CM

## 2016-05-19 DIAGNOSIS — G629 Polyneuropathy, unspecified: Secondary | ICD-10-CM | POA: Insufficient documentation

## 2016-05-19 DIAGNOSIS — C8330 Diffuse large B-cell lymphoma, unspecified site: Secondary | ICD-10-CM

## 2016-05-19 LAB — CBC WITH DIFFERENTIAL/PLATELET
BASO%: 0 % (ref 0.0–2.0)
Basophils Absolute: 0 10*3/uL (ref 0.0–0.1)
EOS ABS: 0 10*3/uL (ref 0.0–0.5)
EOS%: 1.1 % (ref 0.0–7.0)
HCT: 34.8 % (ref 34.8–46.6)
HGB: 11.9 g/dL (ref 11.6–15.9)
LYMPH%: 21 % (ref 14.0–49.7)
MCH: 32.7 pg (ref 25.1–34.0)
MCHC: 34.2 g/dL (ref 31.5–36.0)
MCV: 95.6 fL (ref 79.5–101.0)
MONO#: 0.6 10*3/uL (ref 0.1–0.9)
MONO%: 20.3 % — AB (ref 0.0–14.0)
NEUT%: 57.6 % (ref 38.4–76.8)
NEUTROS ABS: 1.6 10*3/uL (ref 1.5–6.5)
PLATELETS: 169 10*3/uL (ref 145–400)
RBC: 3.64 10*6/uL — AB (ref 3.70–5.45)
RDW: 17.1 % — ABNORMAL HIGH (ref 11.2–14.5)
WBC: 2.7 10*3/uL — AB (ref 3.9–10.3)
lymph#: 0.6 10*3/uL — ABNORMAL LOW (ref 0.9–3.3)
nRBC: 0 % (ref 0–0)

## 2016-05-19 LAB — COMPREHENSIVE METABOLIC PANEL
ALT: 18 U/L (ref 0–55)
ANION GAP: 9 meq/L (ref 3–11)
AST: 28 U/L (ref 5–34)
Albumin: 3.4 g/dL — ABNORMAL LOW (ref 3.5–5.0)
Alkaline Phosphatase: 105 U/L (ref 40–150)
BUN: 17.5 mg/dL (ref 7.0–26.0)
CHLORIDE: 96 meq/L — AB (ref 98–109)
CO2: 25 meq/L (ref 22–29)
Calcium: 9.4 mg/dL (ref 8.4–10.4)
Creatinine: 0.8 mg/dL (ref 0.6–1.1)
EGFR: 62 mL/min/{1.73_m2} — ABNORMAL LOW (ref >90)
Glucose: 101 mg/dl (ref 70–140)
Potassium: 4.5 mEq/L (ref 3.5–5.1)
Sodium: 130 mEq/L — ABNORMAL LOW (ref 136–145)
Total Bilirubin: 0.65 mg/dL (ref 0.20–1.20)
Total Protein: 6.4 g/dL (ref 6.4–8.3)

## 2016-05-19 LAB — LACTATE DEHYDROGENASE: LDH: 245 U/L (ref 125–245)

## 2016-05-19 MED ORDER — SODIUM CHLORIDE 0.9 % IJ SOLN
10.0000 mL | INTRAMUSCULAR | Status: DC | PRN
  Administered 2016-05-19: 10 mL via INTRAVENOUS
  Filled 2016-05-19: qty 10

## 2016-05-19 MED ORDER — GABAPENTIN 100 MG PO CAPS: ORAL_CAPSULE | ORAL | 2 refills | Status: DC

## 2016-05-19 MED ORDER — HEPARIN SOD (PORK) LOCK FLUSH 100 UNIT/ML IV SOLN
500.0000 [IU] | Freq: Once | INTRAVENOUS | Status: AC | PRN
  Administered 2016-05-19: 500 [IU] via INTRAVENOUS
  Filled 2016-05-19: qty 5

## 2016-05-19 NOTE — Progress Notes (Signed)
No images are attached to the encounter. No scans are attached to the encounter. No scans are attached to the encounter. Melissa Cross NOTE  Melissa Jordan, MD [address] [place] [address]  DIAGNOSIS:  #1 non-Hodgkin's lymphoma diagnosed on bone marrow biopsy in June 2012 #2 metastatic renal cell carcinoma initially diagnosed as stage III (T3a N0 M0) renal cell carcinoma diagnosed in June 2012 with pulmonary metastasis diagnosed in August 2014.  PRIOR THERAPY: 1) status post right nephrectomy under the care Dr. Diona Fanti 09/02/2010. 2) systemic chemotherapy with CHOP/Rituxan given every 3 weeks, status post 6 cycles.   CURRENT THERAPY: Votrient 800 mg by mouth daily. Status post 28 months treatment. This was changed to Votrient 600 mg by mouth daily between 04/25/2015 until 07/26/2015. This was further reduced to 400 mg by mouth daily starting on 07/27/2015 secondary to fatigue.  INTERVAL HISTORY: Melissa Cross 83 y.o. female returns for routine follow-up of her renal cell carcinoma. Her son accompanies her today. The patient is feeling fine today with no specific complaints except for fatigue and ongoing neuropathy. Her blood pressure still remains slightly elevated. Weight is stable since her last visit. She continues to have neuropathy in the right fourth and little finger probably secondary to ulnar neuropathy. She is currently on gabapentin 100 mg 3 times a day which was increased at her last visit 2 months ago. She does not notice much change is asking for an increase in her gabapentin dose today. She denied having any fevers or chills. Denies nausea, vomiting, diarrhea, or constipation. Denies chest pain. Denies shortness of breath, cough, and hemoptysis. She continues to tolerate her Votrient 400 mg daily much better. She is here for evaluation of symptoms side effects of her medication.  MEDICAL HISTORY: Past Medical History:  Diagnosis  Date  . Anemia   . Arthritis    knee /hip  . Blood transfusion   . Cancer ([place])    kidney cancer, NHL   . Cancer [facility])    last chemo 11/04/10   . Cataract    right  . Glaucoma   . Headache(784.0)    hx of migraines  . History of bladder infections   . History of blood transfusion   . History of shingles   . Hypercholesteremia    takes Zocor daily  . Hypertension    takes Nadolol daily  . Hypothyroid   . Hypothyroidism    takes Synthroid daily  . Non Hodgkin's lymphoma ([place])   . Peripheral neuropathy   . PONV (postoperative nausea and vomiting)   . Renal cell carcinoma   . Scoliosis   . Shortness of breath    with exertion  . Stroke ([place])    hx of TIA  . Thrush 12/17/2010  . TIA (transient ischemic attack)     ALLERGIES:  is allergic to crab [shellfish allergy] and dilaudid [hydromorphone hcl].  MEDICATIONS:  Current Outpatient Prescriptions  Medication Sig Dispense Refill  . amLODipine (NORVASC) 5 MG tablet Take 1 tablet (5 mg total) by mouth daily. 30 tablet 0  . aspirin EC 81 MG tablet Take 81 mg by mouth at bedtime.     . Calcium Citrate (CITRACAL PO) Take 1 tablet by mouth 3 (three) times a week.     . Calcium-Magnesium-Vitamin D (CITRACAL CALCIUM+D) 600-40-500 MG-MG-UNIT TB24 Take 630 mg by mouth.    . cholecalciferol (VITAMIN D) 400 UNITS TABS Take 400 Units by mouth daily.      [name]  gabapentin (NEURONTIN) 100 MG capsule Take 1 capsule (100 mg total) by mouth 3 (three) times daily. 90 capsule 1  . levothyroxine (SYNTHROID, LEVOTHROID) 50 MCG tablet Take 50 mcg by mouth every morning.     . Multiple Vitamin (MULITIVITAMIN WITH MINERALS) TABS Take 1 tablet by mouth daily.      . nadolol (CORGARD) 40 MG tablet Take 40 mg by mouth at bedtime.     . pazopanib (VOTRIENT) 200 MG tablet Take 2 tablets (400 mg total) by mouth daily. Take on an empty stomach. 60 tablet 11  . prednisoLONE acetate (PRED FORTE) 1 % ophthalmic suspension instill 1 drop into right eye twice a  day and 1 drop into left eye once daily    . spironolactone (ALDACTONE) 25 MG tablet take 1/2 tablet by mouth once daily with food  0  . sulfamethoxazole-trimethoprim (BACTRIM DS,SEPTRA DS) 800-160 MG tablet TK 1 T PO BID  0  . timolol (BETIMOL) 0.5 % ophthalmic solution Place 1 drop into the left eye 2 (two) times daily.    . timolol (TIMOPTIC) 0.5 % ophthalmic solution 1 drop.  10  . UNABLE TO FIND Walker  Use with ambulation 1 each 1  . vitamin B-12 (CYANOCOBALAMIN) 1000 MCG tablet Place 1,000 mcg under the tongue daily.      No current facility-administered medications for this visit.    Facility-Administered Medications Ordered in Other Visits  Medication Dose Route Frequency Provider Last Rate Last Dose  . sodium chloride 0.9 % injection 10 mL  10 mL Intravenous PRN Curt Bears, MD   10 mL at 05/17/12 0846  . sodium chloride 0.9 % injection 10 mL  10 mL Intravenous PRN Curt Bears, MD   10 mL at 05/19/16 1517    SURGICAL HISTORY:  Past Surgical History:  Procedure Laterality Date  . left cataract removed    . MOUTH SURGERY    . NEPHRECTOMY     Right Side  . PORTACATH PLACEMENT     right subclavian  . TONSILLECTOMY     as a child  . VIDEO BRONCHOSCOPY WITH ENDOBRONCHIAL NAVIGATION N/A 08/06/2012   Procedure: VIDEO BRONCHOSCOPY WITH ENDOBRONCHIAL NAVIGATION;  Surgeon: Collene Gobble, MD;  Location: [facility] [place];  Service: Thoracic;  Laterality: N/A;  . VIDEO BRONCHOSCOPY WITH ENDOBRONCHIAL ULTRASOUND N/A 08/06/2012   Procedure: VIDEO BRONCHOSCOPY WITH ENDOBRONCHIAL ULTRASOUND;  Surgeon: Collene Gobble, MD;  Location: [place];  Service: Thoracic;  Laterality: N/A;    REVIEW OF SYSTEMS:  Review of Systems  Constitutional: Positive for malaise/fatigue. Negative for chills, fever and weight loss.  HENT: Negative.   Eyes: Negative.   Respiratory: Negative.   Cardiovascular: Negative.   Gastrointestinal: Negative.   Genitourinary: Negative.   Musculoskeletal: Negative.   Skin:  Negative.   Neurological: Negative for dizziness, focal weakness, weakness and headaches.       Neuropathy to her right hand.  Endo/Heme/Allergies: Negative.   Psychiatric/Behavioral: Negative.      PHYSICAL EXAMINATION: Physical Exam  Constitutional: She is oriented to person, place, and time and well-developed, well-nourished, and in no distress. No distress.  HENT:  Head: Normocephalic.  Mouth/Throat: Oropharynx is clear and moist. No oropharyngeal exudate.  Eyes: Conjunctivae are normal. Right eye exhibits no discharge. Left eye exhibits no discharge. No scleral icterus.  Neck: Neck supple. No tracheal deviation present. No thyromegaly present.  Cardiovascular: Normal rate, regular rhythm and normal heart sounds.   Pulmonary/Chest: Breath sounds normal. No respiratory distress. She has no wheezes.  She has no rales.  Abdominal: Soft. Bowel sounds are normal. She exhibits no distension and no mass. There is no guarding.  Musculoskeletal: Normal range of motion. She exhibits no edema.  Lymphadenopathy:    She has no cervical adenopathy.  Neurological: She is alert and oriented to person, place, and time. She exhibits normal muscle tone.  Skin: Skin is warm and dry. No rash noted. She is not diaphoretic. No erythema. No pallor.  Psychiatric: Mood, memory, affect and judgment normal.  Vitals reviewed.   ECOG PERFORMANCE STATUS: 1 - Symptomatic but completely ambulatory  There were no vitals taken for this visit.  LABORATORY DATA: Lab Results  Component Value Date   WBC 4.7 03/24/2016   HGB 12.1 03/24/2016   HCT 36.1 03/24/2016   MCV 99.3 03/24/2016   PLT 249 03/24/2016      Chemistry      Component Value Date/Time   NA 130 (L) 03/24/2016 1349   K 4.4 03/24/2016 1349   CL 98 06/12/2013 0503   CL 102 04/01/2012 0805   CO2 26 03/24/2016 1349   BUN 20.3 03/24/2016 1349   CREATININE 0.9 03/24/2016 1349      Component Value Date/Time   CALCIUM 9.0 03/24/2016 1349    ALKPHOS 109 03/24/2016 1349   AST 21 03/24/2016 1349   ALT 16 03/24/2016 1349   BILITOT 0.51 03/24/2016 1349       RADIOGRAPHIC STUDIES:  No results found.   ASSESSMENT/PLAN:  No problem-specific Assessment & Plan notes found for this encounter.  This is a very pleasant 83 year old female with: 1) non-Hodgkin's lymphoma diagnosed in bone marrow biopsy in June 2012 status post 6 cycles of chemotherapy with CHOP/Rituxan with complete response. She is currently on observation. 2) metastatic renal cell carcinoma initially diagnosed as stage III, status post right nephrectomy and she is currently on treatment with reduced dose Votrient 400 mg by mouth daily since July 2017. She is tolerating the lower dose much better. I have recommended that she continue her current treatment with Votrient with the same dose. She is due for restaging CT scans prior to her next visit. I have scheduled these to be done in mid July. 3) uncontrolled hypertension: She was advised to take her blood pressure medication routinely and to check her blood pressure regularly. 4) for her neuropathy, I will increase her gabapentin to 100 mg in the morning, 100 mg in the afternoon, and 200 mg at bedtime. A new prescription was sent to her pharmacy today.  The patient will return for follow-up of her renal cell carcinoma in 2 months. Restaging CT scans will be discussed at her next visit.  All questions were answered. The patient knows to call the clinic with any problems, questions or concerns. We can certainly see the patient much sooner if necessary.  Erasmo Downer Maykayla Highley,05/19/16

## 2016-05-19 NOTE — Telephone Encounter (Signed)
Patient was given 2 bottles of Contrast per CT ordered.  Appointments scheduled per 05/19/16 los. Patient was given a copy of the AVS report and appointment schedule per 05/19/16 los.

## 2016-05-21 DIAGNOSIS — D709 Neutropenia, unspecified: Secondary | ICD-10-CM | POA: Diagnosis not present

## 2016-05-21 DIAGNOSIS — N183 Chronic kidney disease, stage 3 (moderate): Secondary | ICD-10-CM | POA: Diagnosis not present

## 2016-05-21 DIAGNOSIS — I129 Hypertensive chronic kidney disease with stage 1 through stage 4 chronic kidney disease, or unspecified chronic kidney disease: Secondary | ICD-10-CM | POA: Diagnosis not present

## 2016-05-21 DIAGNOSIS — E46 Unspecified protein-calorie malnutrition: Secondary | ICD-10-CM | POA: Diagnosis not present

## 2016-05-21 DIAGNOSIS — R2689 Other abnormalities of gait and mobility: Secondary | ICD-10-CM | POA: Diagnosis not present

## 2016-05-21 DIAGNOSIS — G5620 Lesion of ulnar nerve, unspecified upper limb: Secondary | ICD-10-CM | POA: Diagnosis not present

## 2016-05-28 DIAGNOSIS — I129 Hypertensive chronic kidney disease with stage 1 through stage 4 chronic kidney disease, or unspecified chronic kidney disease: Secondary | ICD-10-CM | POA: Diagnosis not present

## 2016-05-28 DIAGNOSIS — E46 Unspecified protein-calorie malnutrition: Secondary | ICD-10-CM | POA: Diagnosis not present

## 2016-05-28 DIAGNOSIS — D709 Neutropenia, unspecified: Secondary | ICD-10-CM | POA: Diagnosis not present

## 2016-05-28 DIAGNOSIS — N183 Chronic kidney disease, stage 3 (moderate): Secondary | ICD-10-CM | POA: Diagnosis not present

## 2016-05-28 DIAGNOSIS — G5620 Lesion of ulnar nerve, unspecified upper limb: Secondary | ICD-10-CM | POA: Diagnosis not present

## 2016-05-28 DIAGNOSIS — R2689 Other abnormalities of gait and mobility: Secondary | ICD-10-CM | POA: Diagnosis not present

## 2016-06-02 DIAGNOSIS — D709 Neutropenia, unspecified: Secondary | ICD-10-CM | POA: Diagnosis not present

## 2016-06-02 DIAGNOSIS — R2689 Other abnormalities of gait and mobility: Secondary | ICD-10-CM | POA: Diagnosis not present

## 2016-06-02 DIAGNOSIS — G5620 Lesion of ulnar nerve, unspecified upper limb: Secondary | ICD-10-CM | POA: Diagnosis not present

## 2016-06-02 DIAGNOSIS — N183 Chronic kidney disease, stage 3 (moderate): Secondary | ICD-10-CM | POA: Diagnosis not present

## 2016-06-02 DIAGNOSIS — E46 Unspecified protein-calorie malnutrition: Secondary | ICD-10-CM | POA: Diagnosis not present

## 2016-06-02 DIAGNOSIS — I129 Hypertensive chronic kidney disease with stage 1 through stage 4 chronic kidney disease, or unspecified chronic kidney disease: Secondary | ICD-10-CM | POA: Diagnosis not present

## 2016-06-04 DIAGNOSIS — R2689 Other abnormalities of gait and mobility: Secondary | ICD-10-CM | POA: Diagnosis not present

## 2016-06-04 DIAGNOSIS — E46 Unspecified protein-calorie malnutrition: Secondary | ICD-10-CM | POA: Diagnosis not present

## 2016-06-04 DIAGNOSIS — I129 Hypertensive chronic kidney disease with stage 1 through stage 4 chronic kidney disease, or unspecified chronic kidney disease: Secondary | ICD-10-CM | POA: Diagnosis not present

## 2016-06-04 DIAGNOSIS — G5620 Lesion of ulnar nerve, unspecified upper limb: Secondary | ICD-10-CM | POA: Diagnosis not present

## 2016-06-04 DIAGNOSIS — N183 Chronic kidney disease, stage 3 (moderate): Secondary | ICD-10-CM | POA: Diagnosis not present

## 2016-06-04 DIAGNOSIS — D709 Neutropenia, unspecified: Secondary | ICD-10-CM | POA: Diagnosis not present

## 2016-06-11 DIAGNOSIS — N183 Chronic kidney disease, stage 3 (moderate): Secondary | ICD-10-CM | POA: Diagnosis not present

## 2016-06-11 DIAGNOSIS — I129 Hypertensive chronic kidney disease with stage 1 through stage 4 chronic kidney disease, or unspecified chronic kidney disease: Secondary | ICD-10-CM | POA: Diagnosis not present

## 2016-06-11 DIAGNOSIS — D709 Neutropenia, unspecified: Secondary | ICD-10-CM | POA: Diagnosis not present

## 2016-06-11 DIAGNOSIS — G5620 Lesion of ulnar nerve, unspecified upper limb: Secondary | ICD-10-CM | POA: Diagnosis not present

## 2016-06-11 DIAGNOSIS — R2689 Other abnormalities of gait and mobility: Secondary | ICD-10-CM | POA: Diagnosis not present

## 2016-06-11 DIAGNOSIS — E46 Unspecified protein-calorie malnutrition: Secondary | ICD-10-CM | POA: Diagnosis not present

## 2016-06-18 DIAGNOSIS — G5620 Lesion of ulnar nerve, unspecified upper limb: Secondary | ICD-10-CM | POA: Diagnosis not present

## 2016-06-18 DIAGNOSIS — I129 Hypertensive chronic kidney disease with stage 1 through stage 4 chronic kidney disease, or unspecified chronic kidney disease: Secondary | ICD-10-CM | POA: Diagnosis not present

## 2016-06-18 DIAGNOSIS — R2689 Other abnormalities of gait and mobility: Secondary | ICD-10-CM | POA: Diagnosis not present

## 2016-06-18 DIAGNOSIS — N183 Chronic kidney disease, stage 3 (moderate): Secondary | ICD-10-CM | POA: Diagnosis not present

## 2016-06-18 DIAGNOSIS — D709 Neutropenia, unspecified: Secondary | ICD-10-CM | POA: Diagnosis not present

## 2016-06-18 DIAGNOSIS — E46 Unspecified protein-calorie malnutrition: Secondary | ICD-10-CM | POA: Diagnosis not present

## 2016-07-16 ENCOUNTER — Other Ambulatory Visit (HOSPITAL_BASED_OUTPATIENT_CLINIC_OR_DEPARTMENT_OTHER): Payer: Medicare Other

## 2016-07-16 ENCOUNTER — Ambulatory Visit (HOSPITAL_COMMUNITY)
Admission: RE | Admit: 2016-07-16 | Discharge: 2016-07-16 | Disposition: A | Discharge status: Home / Self Care | Payer: Medicare Other | Source: Ambulatory Visit | Attending: Oncology | Admitting: Oncology

## 2016-07-16 ENCOUNTER — Ambulatory Visit (HOSPITAL_BASED_OUTPATIENT_CLINIC_OR_DEPARTMENT_OTHER): Payer: Medicare Other

## 2016-07-16 DIAGNOSIS — Z905 Acquired absence of kidney: Secondary | ICD-10-CM | POA: Insufficient documentation

## 2016-07-16 DIAGNOSIS — C641 Malignant neoplasm of right kidney, except renal pelvis: Secondary | ICD-10-CM

## 2016-07-16 DIAGNOSIS — Z452 Encounter for adjustment and management of vascular access device: Secondary | ICD-10-CM | POA: Diagnosis present

## 2016-07-16 DIAGNOSIS — I700 Atherosclerosis of aorta: Secondary | ICD-10-CM | POA: Diagnosis not present

## 2016-07-16 DIAGNOSIS — C8590 Non-Hodgkin lymphoma, unspecified, unspecified site: Secondary | ICD-10-CM | POA: Diagnosis not present

## 2016-07-16 DIAGNOSIS — Z95828 Presence of other vascular implants and grafts: Secondary | ICD-10-CM

## 2016-07-16 LAB — CBC WITH DIFFERENTIAL/PLATELET
BASO%: 0 % (ref 0.0–2.0)
BASOS ABS: 0 10*3/uL (ref 0.0–0.1)
EOS ABS: 0 10*3/uL (ref 0.0–0.5)
EOS%: 0 % (ref 0.0–7.0)
HEMATOCRIT: 33.1 % — AB (ref 34.8–46.6)
HGB: 11.1 g/dL — ABNORMAL LOW (ref 11.6–15.9)
LYMPH#: 0.7 10*3/uL — AB (ref 0.9–3.3)
LYMPH%: 25.9 % (ref 14.0–49.7)
MCH: 32.5 pg (ref 25.1–34.0)
MCHC: 33.5 g/dL (ref 31.5–36.0)
MCV: 96.8 fL (ref 79.5–101.0)
MONO#: 0.3 10*3/uL (ref 0.1–0.9)
MONO%: 11.9 % (ref 0.0–14.0)
NEUT#: 1.8 10*3/uL (ref 1.5–6.5)
NEUT%: 62.2 % (ref 38.4–76.8)
PLATELETS: 198 10*3/uL (ref 145–400)
RBC: 3.42 10*6/uL — AB (ref 3.70–5.45)
RDW: 18.2 % — ABNORMAL HIGH (ref 11.2–14.5)
WBC: 2.9 10*3/uL — ABNORMAL LOW (ref 3.9–10.3)

## 2016-07-16 LAB — COMPREHENSIVE METABOLIC PANEL
ALT: 18 U/L (ref 0–55)
ANION GAP: 7 meq/L (ref 3–11)
AST: 27 U/L (ref 5–34)
Albumin: 3.3 g/dL — ABNORMAL LOW (ref 3.5–5.0)
Alkaline Phosphatase: 116 U/L (ref 40–150)
BUN: 15.4 mg/dL (ref 7.0–26.0)
CHLORIDE: 89 meq/L — AB (ref 98–109)
CO2: 29 meq/L (ref 22–29)
CREATININE: 0.8 mg/dL (ref 0.6–1.1)
Calcium: 9.4 mg/dL (ref 8.4–10.4)
EGFR: 64 mL/min/{1.73_m2} — AB (ref >90)
Glucose: 88 mg/dl (ref 70–140)
POTASSIUM: 4.4 meq/L (ref 3.5–5.1)
Sodium: 125 mEq/L — ABNORMAL LOW (ref 136–145)
Total Bilirubin: 0.64 mg/dL (ref 0.20–1.20)
Total Protein: 6.5 g/dL (ref 6.4–8.3)

## 2016-07-16 LAB — LACTATE DEHYDROGENASE: LDH: 239 U/L (ref 125–245)

## 2016-07-16 MED ORDER — SODIUM CHLORIDE 0.9 % IJ SOLN
10.0000 mL | INTRAMUSCULAR | Status: DC | PRN
Start: 2016-07-16 — End: 2016-07-16
  Administered 2016-07-16: 10 mL via INTRAVENOUS
  Filled 2016-07-16: qty 10

## 2016-07-16 MED ORDER — HEPARIN SOD (PORK) LOCK FLUSH 100 UNIT/ML IV SOLN
500.0000 [IU] | Freq: Once | INTRAVENOUS | Status: AC | PRN
  Administered 2016-07-16: 500 [IU] via INTRAVENOUS
  Filled 2016-07-16: qty 5

## 2016-07-16 NOTE — Patient Instructions (Signed)

## 2016-07-23 ENCOUNTER — Encounter: Payer: Self-pay | Admitting: Internal Medicine

## 2016-07-23 ENCOUNTER — Ambulatory Visit (HOSPITAL_BASED_OUTPATIENT_CLINIC_OR_DEPARTMENT_OTHER): Payer: Medicare Other | Admitting: Internal Medicine

## 2016-07-23 VITALS — BP 136/67 | HR 62 | Temp 98.4°F | Resp 18 | Ht 63.0 in

## 2016-07-23 DIAGNOSIS — Z8572 Personal history of non-Hodgkin lymphomas: Secondary | ICD-10-CM

## 2016-07-23 DIAGNOSIS — C641 Malignant neoplasm of right kidney, except renal pelvis: Secondary | ICD-10-CM | POA: Diagnosis not present

## 2016-07-23 DIAGNOSIS — C7800 Secondary malignant neoplasm of unspecified lung: Secondary | ICD-10-CM | POA: Diagnosis not present

## 2016-07-23 DIAGNOSIS — E871 Hypo-osmolality and hyponatremia: Secondary | ICD-10-CM | POA: Diagnosis not present

## 2016-07-23 DIAGNOSIS — G629 Polyneuropathy, unspecified: Secondary | ICD-10-CM

## 2016-07-23 DIAGNOSIS — C8330 Diffuse large B-cell lymphoma, unspecified site: Secondary | ICD-10-CM

## 2016-07-23 DIAGNOSIS — Z5111 Encounter for antineoplastic chemotherapy: Secondary | ICD-10-CM

## 2016-07-23 NOTE — Progress Notes (Signed)
[place] [phone]   [phone]  OFFICE PROGRESS NOTE  Jonathon Jordan, MD [place] [address]  DIAGNOSIS:  #1 non-Hodgkin's lymphoma diagnosed on bone marrow biopsy in June of 2012  #2 metastatic renal cell carcinoma initially diagnosed as stage III (T3a N0M0) renal cell carcinoma diagnosed in June of 2012 with pulmonary metastasis diagnosed in August of 2014.  PRIOR THERAPY: 1) Status post right nephrectomy under the care of Dr. Diona Fanti on 09/02/2010.  2) Systemic chemotherapy with CHOP/Rituxan given every 3 weeks, status post 6 cycles.   CURRENT THERAPY: Votrient 800 mg by mouth daily. Status post 28 months of treatment. This will be changed to Votrient 600 mg by mouth daily between 04/25/2015 until 07/26/2015. This was further reduced to 400 mg by mouth daily starting 07/27/2015 secondary to fatigue. Status post 12 months of this new dose.  INTERVAL HISTORY: Melissa Cross 83 y.o. female returns to the clinic today for follow-up visit accompanied by her son. The patient is feeling fine today except for the weakness in her lower extremities and fatigue. She is currently on Neurontin for peripheral neuropathy and she takes 400 mg daily. She is rating her current treatment with Votrient fairly well. She denied having any chest pain, shortness of breath, cough or hemoptysis. She denied having any fever or chills. She has no nausea, vomiting, diarrhea or constipation. She had repeat CT scan of the chest, abdomen and pelvis performed recently and she is here for evaluation and discussion of her scan results and treatment options.  MEDICAL HISTORY: Past Medical History:  Diagnosis Date  . Anemia   . Arthritis    knee /hip  . Blood transfusion   . Cancer ([place])    kidney cancer, NHL   . Cancer [facility])    last chemo 11/04/10   . Cataract    right  . Glaucoma   . Headache(784.0)    hx of migraines  .  History of bladder infections   . History of blood transfusion   . History of shingles   . Hypercholesteremia    takes Zocor daily  . Hypertension    takes Nadolol daily  . Hypothyroid   . Hypothyroidism    takes Synthroid daily  . Non Hodgkin's lymphoma ([place])   . Peripheral neuropathy   . PONV (postoperative nausea and vomiting)   . Renal cell carcinoma   . Scoliosis   . Shortness of breath    with exertion  . Stroke ([place])    hx of TIA  . Thrush 12/17/2010  . TIA (transient ischemic attack)     ALLERGIES:  is allergic to crab [shellfish allergy] and dilaudid [hydromorphone hcl].  MEDICATIONS:  Current Outpatient Prescriptions  Medication Sig Dispense Refill  . amLODipine (NORVASC) 5 MG tablet Take 1 tablet (5 mg total) by mouth daily. 30 tablet 0  . aspirin EC 81 MG tablet Take 81 mg by mouth at bedtime.     . Calcium Citrate (CITRACAL PO) Take 1 tablet by mouth 3 (three) times a week.     . Calcium-Magnesium-Vitamin D (CITRACAL CALCIUM+D) 600-40-500 MG-MG-UNIT TB24 Take 630 mg by mouth.    . cholecalciferol (VITAMIN D) 400 UNITS TABS Take 400 Units by mouth daily.      [name] gabapentin (NEURONTIN) 100 MG capsule Take 1 capsule in am, 1 in pm, and 2 at bedtime. 120 capsule 2  . levothyroxine (SYNTHROID, LEVOTHROID) 50 MCG  tablet Take 50 mcg by mouth every morning.     . Multiple Vitamin (MULITIVITAMIN WITH MINERALS) TABS Take 1 tablet by mouth daily.      . nadolol (CORGARD) 40 MG tablet Take 40 mg by mouth at bedtime.     . pazopanib (VOTRIENT) 200 MG tablet Take 2 tablets (400 mg total) by mouth daily. Take on an empty stomach. 60 tablet 11  . prednisoLONE acetate (PRED FORTE) 1 % ophthalmic suspension instill 1 drop into right eye twice a day and 1 drop into left eye once daily    . spironolactone (ALDACTONE) 25 MG tablet take 1/2 tablet by mouth once daily with food  0  . sulfamethoxazole-trimethoprim (BACTRIM DS,SEPTRA DS) 800-160 MG tablet TK 1 T PO BID  0  . timolol  (BETIMOL) 0.5 % ophthalmic solution Place 1 drop into the left eye 2 (two) times daily.    . timolol (TIMOPTIC) 0.5 % ophthalmic solution 1 drop.  10  . UNABLE TO FIND Walker  Use with ambulation 1 each 1  . vitamin B-12 (CYANOCOBALAMIN) 1000 MCG tablet Place 1,000 mcg under the tongue daily.      No current facility-administered medications for this visit.    Facility-Administered Medications Ordered in Other Visits  Medication Dose Route Frequency Provider Last Rate Last Dose  . sodium chloride 0.9 % injection 10 mL  10 mL Intravenous PRN Curt Bears, MD   10 mL at 05/17/12 0846    SURGICAL HISTORY:  Past Surgical History:  Procedure Laterality Date  . left cataract removed    . MOUTH SURGERY    . NEPHRECTOMY     Right Side  . PORTACATH PLACEMENT     right subclavian  . TONSILLECTOMY     as a child  . VIDEO BRONCHOSCOPY WITH ENDOBRONCHIAL NAVIGATION N/A 08/06/2012   Procedure: VIDEO BRONCHOSCOPY WITH ENDOBRONCHIAL NAVIGATION;  Surgeon: Collene Gobble, MD;  Location: [place];  Service: Thoracic;  Laterality: N/A;  . VIDEO BRONCHOSCOPY WITH ENDOBRONCHIAL ULTRASOUND N/A 08/06/2012   Procedure: VIDEO BRONCHOSCOPY WITH ENDOBRONCHIAL ULTRASOUND;  Surgeon: Collene Gobble, MD;  Location: [facility];  Service: Thoracic;  Laterality: N/A;    REVIEW OF SYSTEMS:  Constitutional: positive for fatigue Eyes: negative Ears, nose, mouth, throat, and face: negative Respiratory: negative Cardiovascular: negative Gastrointestinal: negative Genitourinary:negative Integument/breast: negative Hematologic/lymphatic: negative Musculoskeletal:positive for muscle weakness Neurological: positive for paresthesia Behavioral/Psych: negative Endocrine: negative Allergic/Immunologic: negative   PHYSICAL EXAMINATION: General appearance: alert, cooperative, fatigued and no distress Head: Normocephalic, without obvious abnormality, atraumatic Neck: no adenopathy, no JVD, supple, symmetrical, trachea midline  and thyroid not enlarged, symmetric, no tenderness/mass/nodules Lymph nodes: Cervical, supraclavicular, and axillary nodes normal. Resp: clear to auscultation bilaterally Back: symmetric, no curvature. ROM normal. No CVA tenderness. Cardio: regular rate and rhythm, S1, S2 normal, no murmur, click, rub or gallop GI: soft, non-tender; bowel sounds normal; no masses,  no organomegaly Extremities: extremities normal, atraumatic, no cyanosis or edema Neurologic: Alert and oriented X 3, normal strength and tone. Normal symmetric reflexes. Normal coordination and gait  ECOG PERFORMANCE STATUS: 2 - Symptomatic, <50% confined to bed  Blood pressure 136/67, pulse 62, temperature 98.4 F (36.9 C), temperature source Oral, resp. rate 18, height '5\' 3"'$  (1.6 m), SpO2 99 %.  LABORATORY DATA: Lab Results  Component Value Date   WBC 2.9 (L) 07/16/2016   HGB 11.1 (L) 07/16/2016   HCT 33.1 (L) 07/16/2016   MCV 96.8 07/16/2016   PLT 198 07/16/2016  Chemistry      Component Value Date/Time   NA 125 (L) 07/16/2016 1342   K 4.4 07/16/2016 1342   CL 98 06/12/2013 0503   CL 102 04/01/2012 0805   CO2 29 07/16/2016 1342   BUN 15.4 07/16/2016 1342   CREATININE 0.8 07/16/2016 1342      Component Value Date/Time   CALCIUM 9.4 07/16/2016 1342   ALKPHOS 116 07/16/2016 1342   AST 27 07/16/2016 1342   ALT 18 07/16/2016 1342   BILITOT 0.64 07/16/2016 1342       RADIOGRAPHIC STUDIES: Ct Abdomen Pelvis Wo Contrast  Result Date: 07/16/2016 CLINICAL DATA:  Renal cell carcinoma.  Partial history of lymphoma. EXAM: CT CHEST, ABDOMEN AND PELVIS WITHOUT CONTRAST TECHNIQUE: Multidetector CT imaging of the chest, abdomen and pelvis was performed following the standard protocol without IV contrast. COMPARISON:  CT 01/21/2016 FINDINGS: CT CHEST FINDINGS Cardiovascular: Coronary artery calcification and aortic atherosclerotic calcification. Port in the RIGHT chest wall to the distal SVC. A Mediastinum/Nodes: No  axillary supraclavicular adenopathy. Mediastinal Lungs/Pleura: No suspicious pulmonary nodules. Linear scarring in the inferior RIGHT middle lobe is stable. Musculoskeletal: No aggressive osseous lesion. CT ABDOMEN AND PELVIS FINDINGS Hepatobiliary: Low-attenuation lesion in the RIGHT hepatic lobe consistent hepatic cysts. And noted suspicious lesion liver on this noncontrast exam. Pancreas: Pancreas is normal. No ductal dilatation. No pancreatic inflammation. Spleen: Normal spleen Adrenals/urinary tract: Adrenal glands are normal. RIGHT nephrectomy. No nodularity in nephrectomy bed. LEFT kidney is normal LEFT ureter bladder normal Stomach/Bowel: Stomach is normal. There is a large duodenum diverticulum measuring 18 mm along the second portion the duodenum. Small bowel cecum normal. Appendix is normal. Multiple diverticula of the descending colon sigmoid colon. Rectum normal. Vascular/Lymphatic: Abdominal aorta is normal caliber with atherosclerotic calcification. There is no retroperitoneal or periportal lymphadenopathy. No pelvic lymphadenopathy. Reproductive: Uterus and ovaries normal. Other: No peritoneal nodularity. Musculoskeletal: Number lytic or blastic lesion. IMPRESSION: Chest Impression: 1. No evidence of thoracic metastasis. Abdomen / Pelvis Impression: 1. RIGHT nephrectomy without evidence local recurrence. 2. No Metastatic disease in the abdomen pelvis. 3. No skeletal metastasis. 4.  Aortic Atherosclerosis (ICD10-I70.0). Electronically Signed   By: Suzy Bouchard M.D.   On: 07/16/2016 15:53   Ct Chest Wo Contrast  Result Date: 07/16/2016 CLINICAL DATA:  Renal cell carcinoma.  Partial history of lymphoma. EXAM: CT CHEST, ABDOMEN AND PELVIS WITHOUT CONTRAST TECHNIQUE: Multidetector CT imaging of the chest, abdomen and pelvis was performed following the standard protocol without IV contrast. COMPARISON:  CT 01/21/2016 FINDINGS: CT CHEST FINDINGS Cardiovascular: Coronary artery calcification and  aortic atherosclerotic calcification. Port in the RIGHT chest wall to the distal SVC. A Mediastinum/Nodes: No axillary supraclavicular adenopathy. Mediastinal Lungs/Pleura: No suspicious pulmonary nodules. Linear scarring in the inferior RIGHT middle lobe is stable. Musculoskeletal: No aggressive osseous lesion. CT ABDOMEN AND PELVIS FINDINGS Hepatobiliary: Low-attenuation lesion in the RIGHT hepatic lobe consistent hepatic cysts. And noted suspicious lesion liver on this noncontrast exam. Pancreas: Pancreas is normal. No ductal dilatation. No pancreatic inflammation. Spleen: Normal spleen Adrenals/urinary tract: Adrenal glands are normal. RIGHT nephrectomy. No nodularity in nephrectomy bed. LEFT kidney is normal LEFT ureter bladder normal Stomach/Bowel: Stomach is normal. There is a large duodenum diverticulum measuring 18 mm along the second portion the duodenum. Small bowel cecum normal. Appendix is normal. Multiple diverticula of the descending colon sigmoid colon. Rectum normal. Vascular/Lymphatic: Abdominal aorta is normal caliber with atherosclerotic calcification. There is no retroperitoneal or periportal lymphadenopathy. No pelvic lymphadenopathy. Reproductive: Uterus and ovaries normal.  Other: No peritoneal nodularity. Musculoskeletal: Number lytic or blastic lesion. IMPRESSION: Chest Impression: 1. No evidence of thoracic metastasis. Abdomen / Pelvis Impression: 1. RIGHT nephrectomy without evidence local recurrence. 2. No Metastatic disease in the abdomen pelvis. 3. No skeletal metastasis. 4.  Aortic Atherosclerosis (ICD10-I70.0). Electronically Signed   By: Suzy Bouchard M.D.   On: 07/16/2016 15:53    ASSESSMENT AND PLAN:  This is a very pleasant 83 years old white female with: 1) non-Hodgkin lymphoma diagnosed in bone marrow biopsy in June 2012 status post 6 cycles of chemotherapy with CHOP/Rituxan with complete response. She is currently on observation. 2) metastatic renal cell carcinoma  initially diagnosed as stage III status post right nephrectomy and she is currently on treatment with reduced dose Votrient 400 mg by mouth daily since July 2017. Status post 12 months of treatment. She has been tolerating the lower dose much better. The patient had a recent CT scan of the chest, abdomen and pelvis. I personally and independently reviewed the scans and discuss the results with the patient and her son. Her scan showed no evidence for disease progression. I recommended for the patient to continue her current treatment with Votrient 400 mg by mouth daily. I will continue to monitor her white blood count and chemistry closely. For the hyponatremia, I advised the patient to increase her salt intake slightly. I will repeat her blood work in 2 weeks for reevaluation of her hyponatremia. I also discussed with the patient treatment with demeclocycline but she would like to hold this option for now. I will arrange for the patient to come back for follow-up visit in 2 months for reevaluation and repeat blood work. She was advised to call immediately if she has any concerning symptoms in the interval. The patient voices understanding of current disease status and treatment options and is in agreement with the current care plan.  All questions were answered. The patient knows to call the clinic with any problems, questions or concerns. We can certainly see the patient much sooner if necessary.  Disclaimer: This note was dictated with voice recognition software. Similar sounding words can inadvertently be transcribed and may not be corrected upon review.

## 2016-08-04 ENCOUNTER — Telehealth: Payer: Self-pay | Admitting: Medical Oncology

## 2016-08-04 NOTE — Telephone Encounter (Signed)
Pt cancelled lab for this week and asked [place] to send order to [facility] and they will draw the labs.

## 2016-08-04 NOTE — Telephone Encounter (Signed)
I called [facility] to see if they can draw her labs this week . Melissa Cross will call me back.

## 2016-08-05 ENCOUNTER — Telehealth: Payer: Self-pay | Admitting: Medical Oncology

## 2016-08-05 NOTE — Telephone Encounter (Signed)
I explained to pt that I am waiting to hear from [place] about drawing labs.

## 2016-08-05 NOTE — Telephone Encounter (Signed)
I told pt I have not heard back from Melissa Cross about drawing her labs this week. She is disabled and cannot get here without assistance and her son works.

## 2016-08-06 ENCOUNTER — Telehealth: Payer: Self-pay | Admitting: Medical Oncology

## 2016-08-06 ENCOUNTER — Other Ambulatory Visit: Payer: Medicare Other

## 2016-08-06 NOTE — Telephone Encounter (Signed)
Pt is home bound and cannot get to appt independently. Son unavailable for lab appt. Request for home health visit 2 x week for 3 weeks for observation and assessment, lab draw , assess for med changes . Order give for [facility] twice a week for 3 weeks . Labs to be done next week.

## 2016-08-09 DIAGNOSIS — C641 Malignant neoplasm of right kidney, except renal pelvis: Secondary | ICD-10-CM | POA: Diagnosis not present

## 2016-08-09 DIAGNOSIS — C7800 Secondary malignant neoplasm of unspecified lung: Secondary | ICD-10-CM | POA: Diagnosis not present

## 2016-08-09 DIAGNOSIS — G629 Polyneuropathy, unspecified: Secondary | ICD-10-CM | POA: Diagnosis not present

## 2016-08-09 DIAGNOSIS — I10 Essential (primary) hypertension: Secondary | ICD-10-CM | POA: Diagnosis not present

## 2016-08-09 DIAGNOSIS — M179 Osteoarthritis of knee, unspecified: Secondary | ICD-10-CM | POA: Diagnosis not present

## 2016-08-09 DIAGNOSIS — E871 Hypo-osmolality and hyponatremia: Secondary | ICD-10-CM | POA: Diagnosis not present

## 2016-08-13 DIAGNOSIS — I10 Essential (primary) hypertension: Secondary | ICD-10-CM | POA: Diagnosis not present

## 2016-08-13 DIAGNOSIS — M179 Osteoarthritis of knee, unspecified: Secondary | ICD-10-CM | POA: Diagnosis not present

## 2016-08-13 DIAGNOSIS — E871 Hypo-osmolality and hyponatremia: Secondary | ICD-10-CM | POA: Diagnosis not present

## 2016-08-13 DIAGNOSIS — G629 Polyneuropathy, unspecified: Secondary | ICD-10-CM | POA: Diagnosis not present

## 2016-08-13 DIAGNOSIS — C641 Malignant neoplasm of right kidney, except renal pelvis: Secondary | ICD-10-CM | POA: Diagnosis not present

## 2016-08-13 DIAGNOSIS — C7800 Secondary malignant neoplasm of unspecified lung: Secondary | ICD-10-CM | POA: Diagnosis not present

## 2016-08-15 DIAGNOSIS — C641 Malignant neoplasm of right kidney, except renal pelvis: Secondary | ICD-10-CM | POA: Diagnosis not present

## 2016-08-15 DIAGNOSIS — I10 Essential (primary) hypertension: Secondary | ICD-10-CM | POA: Diagnosis not present

## 2016-08-15 DIAGNOSIS — G629 Polyneuropathy, unspecified: Secondary | ICD-10-CM | POA: Diagnosis not present

## 2016-08-15 DIAGNOSIS — E871 Hypo-osmolality and hyponatremia: Secondary | ICD-10-CM | POA: Diagnosis not present

## 2016-08-15 DIAGNOSIS — M179 Osteoarthritis of knee, unspecified: Secondary | ICD-10-CM | POA: Diagnosis not present

## 2016-08-15 DIAGNOSIS — C7800 Secondary malignant neoplasm of unspecified lung: Secondary | ICD-10-CM | POA: Diagnosis not present

## 2016-08-20 DIAGNOSIS — C7800 Secondary malignant neoplasm of unspecified lung: Secondary | ICD-10-CM | POA: Diagnosis not present

## 2016-08-20 DIAGNOSIS — C641 Malignant neoplasm of right kidney, except renal pelvis: Secondary | ICD-10-CM | POA: Diagnosis not present

## 2016-08-20 DIAGNOSIS — G629 Polyneuropathy, unspecified: Secondary | ICD-10-CM | POA: Diagnosis not present

## 2016-08-20 DIAGNOSIS — E871 Hypo-osmolality and hyponatremia: Secondary | ICD-10-CM | POA: Diagnosis not present

## 2016-08-20 DIAGNOSIS — I10 Essential (primary) hypertension: Secondary | ICD-10-CM | POA: Diagnosis not present

## 2016-08-20 DIAGNOSIS — M179 Osteoarthritis of knee, unspecified: Secondary | ICD-10-CM | POA: Diagnosis not present

## 2016-08-22 DIAGNOSIS — G629 Polyneuropathy, unspecified: Secondary | ICD-10-CM | POA: Diagnosis not present

## 2016-08-22 DIAGNOSIS — M179 Osteoarthritis of knee, unspecified: Secondary | ICD-10-CM | POA: Diagnosis not present

## 2016-08-22 DIAGNOSIS — I10 Essential (primary) hypertension: Secondary | ICD-10-CM | POA: Diagnosis not present

## 2016-08-22 DIAGNOSIS — E871 Hypo-osmolality and hyponatremia: Secondary | ICD-10-CM | POA: Diagnosis not present

## 2016-08-22 DIAGNOSIS — C7800 Secondary malignant neoplasm of unspecified lung: Secondary | ICD-10-CM | POA: Diagnosis not present

## 2016-08-22 DIAGNOSIS — C641 Malignant neoplasm of right kidney, except renal pelvis: Secondary | ICD-10-CM | POA: Diagnosis not present

## 2016-08-25 DIAGNOSIS — C641 Malignant neoplasm of right kidney, except renal pelvis: Secondary | ICD-10-CM | POA: Diagnosis not present

## 2016-08-25 DIAGNOSIS — I10 Essential (primary) hypertension: Secondary | ICD-10-CM | POA: Diagnosis not present

## 2016-08-25 DIAGNOSIS — E871 Hypo-osmolality and hyponatremia: Secondary | ICD-10-CM | POA: Diagnosis not present

## 2016-08-25 DIAGNOSIS — G629 Polyneuropathy, unspecified: Secondary | ICD-10-CM | POA: Diagnosis not present

## 2016-08-25 DIAGNOSIS — C7800 Secondary malignant neoplasm of unspecified lung: Secondary | ICD-10-CM | POA: Diagnosis not present

## 2016-08-25 DIAGNOSIS — M179 Osteoarthritis of knee, unspecified: Secondary | ICD-10-CM | POA: Diagnosis not present

## 2016-08-26 DIAGNOSIS — E871 Hypo-osmolality and hyponatremia: Secondary | ICD-10-CM | POA: Diagnosis not present

## 2016-08-26 DIAGNOSIS — C641 Malignant neoplasm of right kidney, except renal pelvis: Secondary | ICD-10-CM | POA: Diagnosis not present

## 2016-08-26 DIAGNOSIS — I10 Essential (primary) hypertension: Secondary | ICD-10-CM | POA: Diagnosis not present

## 2016-08-26 DIAGNOSIS — C7800 Secondary malignant neoplasm of unspecified lung: Secondary | ICD-10-CM | POA: Diagnosis not present

## 2016-08-26 DIAGNOSIS — G629 Polyneuropathy, unspecified: Secondary | ICD-10-CM | POA: Diagnosis not present

## 2016-08-26 DIAGNOSIS — M179 Osteoarthritis of knee, unspecified: Secondary | ICD-10-CM | POA: Diagnosis not present

## 2016-09-15 ENCOUNTER — Other Ambulatory Visit: Payer: Self-pay | Admitting: Medical Oncology

## 2016-09-15 DIAGNOSIS — G629 Polyneuropathy, unspecified: Secondary | ICD-10-CM

## 2016-09-15 MED ORDER — GABAPENTIN 100 MG PO CAPS: ORAL_CAPSULE | ORAL | 2 refills | Status: AC

## 2016-09-17 ENCOUNTER — Ambulatory Visit (HOSPITAL_BASED_OUTPATIENT_CLINIC_OR_DEPARTMENT_OTHER): Payer: Medicare Other | Admitting: Internal Medicine

## 2016-09-17 ENCOUNTER — Encounter: Payer: Self-pay | Admitting: Internal Medicine

## 2016-09-17 ENCOUNTER — Telehealth: Payer: Self-pay | Admitting: Medical Oncology

## 2016-09-17 ENCOUNTER — Telehealth: Payer: Self-pay | Admitting: Internal Medicine

## 2016-09-17 ENCOUNTER — Other Ambulatory Visit (HOSPITAL_BASED_OUTPATIENT_CLINIC_OR_DEPARTMENT_OTHER): Payer: Medicare Other

## 2016-09-17 ENCOUNTER — Ambulatory Visit (HOSPITAL_BASED_OUTPATIENT_CLINIC_OR_DEPARTMENT_OTHER): Payer: Medicare Other

## 2016-09-17 VITALS — BP 145/69 | HR 65 | Temp 98.9°F | Resp 18 | Ht 63.0 in | Wt 113.5 lb

## 2016-09-17 DIAGNOSIS — Z5111 Encounter for antineoplastic chemotherapy: Secondary | ICD-10-CM

## 2016-09-17 DIAGNOSIS — Z95828 Presence of other vascular implants and grafts: Secondary | ICD-10-CM

## 2016-09-17 DIAGNOSIS — C8330 Diffuse large B-cell lymphoma, unspecified site: Secondary | ICD-10-CM

## 2016-09-17 DIAGNOSIS — Z8572 Personal history of non-Hodgkin lymphomas: Secondary | ICD-10-CM | POA: Diagnosis not present

## 2016-09-17 DIAGNOSIS — C7800 Secondary malignant neoplasm of unspecified lung: Secondary | ICD-10-CM | POA: Diagnosis not present

## 2016-09-17 DIAGNOSIS — E871 Hypo-osmolality and hyponatremia: Secondary | ICD-10-CM

## 2016-09-17 DIAGNOSIS — C641 Malignant neoplasm of right kidney, except renal pelvis: Secondary | ICD-10-CM | POA: Diagnosis not present

## 2016-09-17 DIAGNOSIS — G629 Polyneuropathy, unspecified: Secondary | ICD-10-CM

## 2016-09-17 LAB — CBC WITH DIFFERENTIAL/PLATELET
BASO%: 0.9 % (ref 0.0–2.0)
Basophils Absolute: 0 10*3/uL (ref 0.0–0.1)
EOS ABS: 0 10*3/uL (ref 0.0–0.5)
EOS%: 0.2 % (ref 0.0–7.0)
HEMATOCRIT: 36.3 % (ref 34.8–46.6)
HEMOGLOBIN: 12.4 g/dL (ref 11.6–15.9)
LYMPH#: 0.6 10*3/uL — AB (ref 0.9–3.3)
LYMPH%: 15.1 % (ref 14.0–49.7)
MCH: 34.8 pg — ABNORMAL HIGH (ref 25.1–34.0)
MCHC: 34.2 g/dL (ref 31.5–36.0)
MCV: 101.8 fL — AB (ref 79.5–101.0)
MONO#: 0.5 10*3/uL (ref 0.1–0.9)
MONO%: 11.8 % (ref 0.0–14.0)
NEUT#: 3 10*3/uL (ref 1.5–6.5)
NEUT%: 72 % (ref 38.4–76.8)
PLATELETS: 210 10*3/uL (ref 145–400)
RBC: 3.57 10*6/uL — AB (ref 3.70–5.45)
RDW: 18.5 % — ABNORMAL HIGH (ref 11.2–14.5)
WBC: 4.1 10*3/uL (ref 3.9–10.3)

## 2016-09-17 LAB — COMPREHENSIVE METABOLIC PANEL
ALBUMIN: 3.3 g/dL — AB (ref 3.5–5.0)
ALK PHOS: 105 U/L (ref 40–150)
ALT: 16 U/L (ref 0–55)
ANION GAP: 9 meq/L (ref 3–11)
AST: 27 U/L (ref 5–34)
BILIRUBIN TOTAL: 0.62 mg/dL (ref 0.20–1.20)
BUN: 20.1 mg/dL (ref 7.0–26.0)
CALCIUM: 9.6 mg/dL (ref 8.4–10.4)
CO2: 28 mEq/L (ref 22–29)
CREATININE: 0.8 mg/dL (ref 0.6–1.1)
Chloride: 93 mEq/L — ABNORMAL LOW (ref 98–109)
EGFR: 66 mL/min/{1.73_m2} — AB (ref >90)
Glucose: 85 mg/dl (ref 70–140)
Potassium: 4.3 mEq/L (ref 3.5–5.1)
Sodium: 130 mEq/L — ABNORMAL LOW (ref 136–145)
TOTAL PROTEIN: 6.9 g/dL (ref 6.4–8.3)

## 2016-09-17 MED ORDER — SODIUM CHLORIDE 0.9 % IJ SOLN
10.0000 mL | INTRAMUSCULAR | Status: DC | PRN
  Administered 2016-09-17: 10 mL via INTRAVENOUS
  Filled 2016-09-17: qty 10

## 2016-09-17 MED ORDER — HEPARIN SOD (PORK) LOCK FLUSH 100 UNIT/ML IV SOLN
500.0000 [IU] | Freq: Once | INTRAVENOUS | Status: AC | PRN
  Administered 2016-09-17: 500 [IU] via INTRAVENOUS
  Filled 2016-09-17: qty 5

## 2016-09-17 NOTE — Telephone Encounter (Signed)
Referred pt to [facility] for PT/OT and bedside commode.

## 2016-09-17 NOTE — Telephone Encounter (Signed)
Gave patients son AVS and calendar of upcoming November appointments.

## 2016-09-17 NOTE — Patient Instructions (Signed)

## 2016-09-17 NOTE — Progress Notes (Signed)
[facility] [phone]   [phone]  OFFICE PROGRESS NOTE  Jonathon Jordan, MD [place] [id number]  DIAGNOSIS:  #1 non-Hodgkin's lymphoma diagnosed on bone marrow biopsy in June of 2012  #2 metastatic renal cell carcinoma initially diagnosed as stage III (T3a N0M0) renal cell carcinoma diagnosed in June of 2012 with pulmonary metastasis diagnosed in August of 2014.  PRIOR THERAPY: 1) Status post right nephrectomy under the care of Dr. Diona Fanti on 09/02/2010.  2) Systemic chemotherapy with CHOP/Rituxan given every 3 weeks, status post 6 cycles.   CURRENT THERAPY: Votrient 800 mg by mouth daily. Status post 28 months of treatment. This will be changed to Votrient 600 mg by mouth daily between 04/25/2015 until 07/26/2015. This was further reduced to 400 mg by mouth daily starting 07/27/2015 secondary to fatigue. Status post 14 months of this new dose.  INTERVAL HISTORY: Melissa Cross 83 y.o. female returns to the clinic today for follow-up visit accompanied by her son. The patient is feeling fine today with no specific complaints except for the persistent fatigue and weakness. She uses a walker at home. She denied having any weight loss or night sweats. She denied having any fever or chills. She has significant deconditioning and heart time reaching her bathroom on time needed. She denied having any chest pain, shortness of breath, cough or hemoptysis. She denied having any fever or chills. She has no recent headache or visual changes. She is here today for evaluation and repeat blood work.  MEDICAL HISTORY: Past Medical History:  Diagnosis Date  . Anemia   . Arthritis    knee /hip  . Blood transfusion   . Cancer ([place])    kidney cancer, NHL   . Cancer [facility])    last chemo 11/04/10   . Cataract    right  . Glaucoma   . Headache(784.0)    hx of migraines  . History of bladder infections   . History of  blood transfusion   . History of shingles   . Hypercholesteremia    takes Zocor daily  . Hypertension    takes Nadolol daily  . Hypothyroid   . Hypothyroidism    takes Synthroid daily  . Non Hodgkin's lymphoma ([place])   . Peripheral neuropathy   . PONV (postoperative nausea and vomiting)   . Renal cell carcinoma   . Scoliosis   . Shortness of breath    with exertion  . Stroke ([place])    hx of TIA  . Thrush 12/17/2010  . TIA (transient ischemic attack)     ALLERGIES:  is allergic to crab [shellfish allergy] and dilaudid [hydromorphone hcl].  MEDICATIONS:  Current Outpatient Prescriptions  Medication Sig Dispense Refill  . amLODipine (NORVASC) 5 MG tablet Take 1 tablet (5 mg total) by mouth daily. 30 tablet 0  . aspirin EC 81 MG tablet Take 81 mg by mouth at bedtime.     . Calcium Citrate (CITRACAL PO) Take 1 tablet by mouth 3 (three) times a week.     . Calcium-Magnesium-Vitamin D (CITRACAL CALCIUM+D) 600-40-500 MG-MG-UNIT TB24 Take 630 mg by mouth.    . cholecalciferol (VITAMIN D) 400 UNITS TABS Take 400 Units by mouth daily.      [name] gabapentin (NEURONTIN) 100 MG capsule Take 1 capsule in am, 1 in pm, and 2 at bedtime. 120 capsule 2  . levothyroxine (SYNTHROID, LEVOTHROID) 50 MCG tablet Take 50 mcg by mouth  every morning.     . Multiple Vitamin (MULITIVITAMIN WITH MINERALS) TABS Take 1 tablet by mouth daily.      . nadolol (CORGARD) 40 MG tablet Take 40 mg by mouth at bedtime.     . pazopanib (VOTRIENT) 200 MG tablet Take 2 tablets (400 mg total) by mouth daily. Take on an empty stomach. 60 tablet 11  . prednisoLONE acetate (PRED FORTE) 1 % ophthalmic suspension instill 1 drop into right eye once daily    . spironolactone (ALDACTONE) 25 MG tablet take 1/2 tablet by mouth once daily with food  0  . sulfamethoxazole-trimethoprim (BACTRIM DS,SEPTRA DS) 800-160 MG tablet TK 1 T PO BID  0  . timolol (BETIMOL) 0.5 % ophthalmic solution Place 1 drop into the left eye 2 (two) times daily.     . timolol (TIMOPTIC) 0.5 % ophthalmic solution 1 drop.  10  . UNABLE TO FIND Walker  Use with ambulation 1 each 1  . vitamin B-12 (CYANOCOBALAMIN) 1000 MCG tablet Place 1,000 mcg under the tongue daily.      No current facility-administered medications for this visit.    Facility-Administered Medications Ordered in Other Visits  Medication Dose Route Frequency Provider Last Rate Last Dose  . sodium chloride 0.9 % injection 10 mL  10 mL Intravenous PRN Curt Bears, MD   10 mL at 05/17/12 0846    SURGICAL HISTORY:  Past Surgical History:  Procedure Laterality Date  . left cataract removed    . MOUTH SURGERY    . NEPHRECTOMY     Right Side  . PORTACATH PLACEMENT     right subclavian  . TONSILLECTOMY     as a child  . VIDEO BRONCHOSCOPY WITH ENDOBRONCHIAL NAVIGATION N/A 08/06/2012   Procedure: VIDEO BRONCHOSCOPY WITH ENDOBRONCHIAL NAVIGATION;  Surgeon: Collene Gobble, MD;  Location: [place];  Service: Thoracic;  Laterality: N/A;  . VIDEO BRONCHOSCOPY WITH ENDOBRONCHIAL ULTRASOUND N/A 08/06/2012   Procedure: VIDEO BRONCHOSCOPY WITH ENDOBRONCHIAL ULTRASOUND;  Surgeon: Collene Gobble, MD;  Location: [place];  Service: Thoracic;  Laterality: N/A;    REVIEW OF SYSTEMS:  Constitutional: positive for fatigue Eyes: negative Ears, nose, mouth, throat, and face: negative Respiratory: negative Cardiovascular: negative Gastrointestinal: negative Genitourinary:negative Integument/breast: negative Hematologic/lymphatic: negative Musculoskeletal:positive for arthralgias and muscle weakness Neurological: negative Behavioral/Psych: negative Endocrine: negative Allergic/Immunologic: negative   PHYSICAL EXAMINATION: General appearance: alert, cooperative, fatigued and no distress Head: Normocephalic, without obvious abnormality, atraumatic Neck: no adenopathy, no JVD, supple, symmetrical, trachea midline and thyroid not enlarged, symmetric, no tenderness/mass/nodules Lymph nodes: Cervical,  supraclavicular, and axillary nodes normal. Resp: clear to auscultation bilaterally Back: symmetric, no curvature. ROM normal. No CVA tenderness. Cardio: regular rate and rhythm, S1, S2 normal, no murmur, click, rub or gallop GI: soft, non-tender; bowel sounds normal; no masses,  no organomegaly Extremities: extremities normal, atraumatic, no cyanosis or edema Neurologic: Alert and oriented X 3, normal strength and tone. Normal symmetric reflexes. Normal coordination and gait  ECOG PERFORMANCE STATUS: 2 - Symptomatic, <50% confined to bed  Blood pressure (!) 145/69, pulse 65, temperature 98.9 F (37.2 C), temperature source Oral, resp. rate 18, height '5\' 3"'$  (1.6 m), weight 113 lb 8 oz (51.5 kg), SpO2 99 %.  LABORATORY DATA: Lab Results  Component Value Date   WBC 4.1 09/17/2016   HGB 12.4 09/17/2016   HCT 36.3 09/17/2016   MCV 101.8 (H) 09/17/2016   PLT 210 09/17/2016      Chemistry      Component Value  Date/Time   NA 125 (L) 07/16/2016 1342   K 4.4 07/16/2016 1342   CL 98 06/12/2013 0503   CL 102 04/01/2012 0805   CO2 29 07/16/2016 1342   BUN 15.4 07/16/2016 1342   CREATININE 0.8 07/16/2016 1342      Component Value Date/Time   CALCIUM 9.4 07/16/2016 1342   ALKPHOS 116 07/16/2016 1342   AST 27 07/16/2016 1342   ALT 18 07/16/2016 1342   BILITOT 0.64 07/16/2016 1342       RADIOGRAPHIC STUDIES: No results found.  ASSESSMENT AND PLAN:  This is a very pleasant 83 years old white female with: 1) Non-Hodgkin lymphoma diagnosed in bone marrow biopsy in June 2012 status post 6 cycles of chemotherapy with CHOP/Rituxan with complete response. She is currently on observation. 2) metastatic renal cell carcinoma initially diagnosed as stage III status post right nephrectomy And was started on treatment with Votrient almost 40 years ago. She is currently on treatment with reduced dose Votrient 400 mg by mouth daily since July 2017. Status post 14 months of treatment. She has been  tolerating the lower dose much better but she continues to have increasing fatigue and weakness. The patient also has deconditioning of her health condition and poor ambulation partially because of age and comorbidities. Her lab work today is unremarkable except for the mild persistent hyponatremia. I recommended for her to continue her current treatment with Votrient with the same dose for now. I will see her back for follow-up visit in 2 months for evaluation and repeat blood work. 3) generalized weakness and deconditioning: We will arrange for the patient to have PT/OT. I will also order a bedside commode. The patient was advised to call immediately if she has any concerning symptoms in the interval. The patient voices understanding of current disease status and treatment options and is in agreement with the current care plan.  All questions were answered. The patient knows to call the clinic with any problems, questions or concerns. We can certainly see the patient much sooner if necessary.  Disclaimer: This note was dictated with voice recognition software. Similar sounding words can inadvertently be transcribed and may not be corrected upon review.

## 2016-09-19 ENCOUNTER — Telehealth: Payer: Self-pay | Admitting: *Deleted

## 2016-09-19 NOTE — Telephone Encounter (Signed)
FYI "I received a call from Dr. Worthy Flank nurse about a Len Blalock.  She was seen by him one day this week.  Order was to be faxed for Physical and Occupational Therapy has not been received.  If I knew patient's date of birth, I can view it in EPIC."   Provided date of birth.  "Let them know it will probably be next week.  Since we're just receiving the order today, no one can go out this  weekend."

## 2016-09-20 DIAGNOSIS — C8330 Diffuse large B-cell lymphoma, unspecified site: Secondary | ICD-10-CM | POA: Diagnosis not present

## 2016-09-20 DIAGNOSIS — C641 Malignant neoplasm of right kidney, except renal pelvis: Secondary | ICD-10-CM | POA: Diagnosis not present

## 2016-09-20 DIAGNOSIS — M159 Polyosteoarthritis, unspecified: Secondary | ICD-10-CM | POA: Diagnosis not present

## 2016-09-20 DIAGNOSIS — C7800 Secondary malignant neoplasm of unspecified lung: Secondary | ICD-10-CM | POA: Diagnosis not present

## 2016-09-20 DIAGNOSIS — G629 Polyneuropathy, unspecified: Secondary | ICD-10-CM | POA: Diagnosis not present

## 2016-09-20 DIAGNOSIS — I10 Essential (primary) hypertension: Secondary | ICD-10-CM | POA: Diagnosis not present

## 2016-09-20 NOTE — Telephone Encounter (Signed)
I did sign it.

## 2016-09-22 ENCOUNTER — Telehealth: Payer: Self-pay

## 2016-09-22 NOTE — Telephone Encounter (Signed)
Verdis Frederickson called requesting VO for PT 1x/we for 1 week and 2x/week for 8 weeks. VO given per Dr Julien Nordmann.

## 2016-09-23 ENCOUNTER — Telehealth: Payer: Self-pay | Admitting: Medical Oncology

## 2016-09-23 DIAGNOSIS — I10 Essential (primary) hypertension: Secondary | ICD-10-CM | POA: Diagnosis not present

## 2016-09-23 DIAGNOSIS — C8330 Diffuse large B-cell lymphoma, unspecified site: Secondary | ICD-10-CM | POA: Diagnosis not present

## 2016-09-23 DIAGNOSIS — C7800 Secondary malignant neoplasm of unspecified lung: Secondary | ICD-10-CM | POA: Diagnosis not present

## 2016-09-23 DIAGNOSIS — G629 Polyneuropathy, unspecified: Secondary | ICD-10-CM | POA: Diagnosis not present

## 2016-09-23 DIAGNOSIS — M159 Polyosteoarthritis, unspecified: Secondary | ICD-10-CM | POA: Diagnosis not present

## 2016-09-23 DIAGNOSIS — C641 Malignant neoplasm of right kidney, except renal pelvis: Secondary | ICD-10-CM | POA: Diagnosis not present

## 2016-09-23 NOTE — Telephone Encounter (Signed)
Faxed office note and OT /PT referral

## 2016-09-24 DIAGNOSIS — I10 Essential (primary) hypertension: Secondary | ICD-10-CM | POA: Diagnosis not present

## 2016-09-24 DIAGNOSIS — M159 Polyosteoarthritis, unspecified: Secondary | ICD-10-CM | POA: Diagnosis not present

## 2016-09-24 DIAGNOSIS — G629 Polyneuropathy, unspecified: Secondary | ICD-10-CM | POA: Diagnosis not present

## 2016-09-24 DIAGNOSIS — C7800 Secondary malignant neoplasm of unspecified lung: Secondary | ICD-10-CM | POA: Diagnosis not present

## 2016-09-24 DIAGNOSIS — C641 Malignant neoplasm of right kidney, except renal pelvis: Secondary | ICD-10-CM | POA: Diagnosis not present

## 2016-09-24 DIAGNOSIS — C8330 Diffuse large B-cell lymphoma, unspecified site: Secondary | ICD-10-CM | POA: Diagnosis not present

## 2016-09-26 ENCOUNTER — Telehealth: Payer: Self-pay | Admitting: *Deleted

## 2016-09-26 NOTE — Telephone Encounter (Signed)
[facility] called with request for VO  For social worker to assist pt with community resources. MD out of office will return on Monday 9/24.

## 2016-09-29 ENCOUNTER — Telehealth: Payer: Self-pay | Admitting: Medical Oncology

## 2016-09-29 NOTE — Telephone Encounter (Addendum)
Pt called to report she is now in a wheelchair because she cannot walk . Pt asking if this may be related to Neurontin? Note to [place].

## 2016-09-29 NOTE — Telephone Encounter (Signed)
Could be related. She needs to hold neurontin and Afinitor for one week.

## 2016-09-30 ENCOUNTER — Telehealth: Payer: Self-pay

## 2016-09-30 DIAGNOSIS — I10 Essential (primary) hypertension: Secondary | ICD-10-CM | POA: Diagnosis not present

## 2016-09-30 DIAGNOSIS — C641 Malignant neoplasm of right kidney, except renal pelvis: Secondary | ICD-10-CM | POA: Diagnosis not present

## 2016-09-30 DIAGNOSIS — C8330 Diffuse large B-cell lymphoma, unspecified site: Secondary | ICD-10-CM | POA: Diagnosis not present

## 2016-09-30 DIAGNOSIS — M159 Polyosteoarthritis, unspecified: Secondary | ICD-10-CM | POA: Diagnosis not present

## 2016-09-30 DIAGNOSIS — G629 Polyneuropathy, unspecified: Secondary | ICD-10-CM | POA: Diagnosis not present

## 2016-09-30 DIAGNOSIS — C7800 Secondary malignant neoplasm of unspecified lung: Secondary | ICD-10-CM | POA: Diagnosis not present

## 2016-09-30 NOTE — Telephone Encounter (Signed)
Per Julien Nordmann I instructed pt to hold gabapentin and Votrient ( pt not on afinitor)the patient verbalized understanding,. I asked her to call back Thursday with update of symptoms.

## 2016-09-30 NOTE — Telephone Encounter (Signed)
Shereez PT with [facility] called to report a fall last Friday.  Pt's right knee "gave out" and caused her to fall. There is some pain and swelling in the R knee but pt states it is getting better. Shereez reported pt is unable to walk and is using a wheelchair. This was noted in phone call 9/24.  shereez is asking for VO for social worker to assist pt with community resources.  [place] phone # [phone]

## 2016-09-30 NOTE — Telephone Encounter (Signed)
Melissa Cross at [facility] also called with request for order for [facility] aid for assist with ADLs,- bathing and dressing. And repeated the request for social work for resource finding.

## 2016-09-30 NOTE — Telephone Encounter (Signed)
This is Dr. Worthy Flank pt, I copied him here.   Truitt Merle MD

## 2016-10-01 DIAGNOSIS — G629 Polyneuropathy, unspecified: Secondary | ICD-10-CM | POA: Diagnosis not present

## 2016-10-01 DIAGNOSIS — C8330 Diffuse large B-cell lymphoma, unspecified site: Secondary | ICD-10-CM | POA: Diagnosis not present

## 2016-10-01 DIAGNOSIS — M159 Polyosteoarthritis, unspecified: Secondary | ICD-10-CM | POA: Diagnosis not present

## 2016-10-01 DIAGNOSIS — C7800 Secondary malignant neoplasm of unspecified lung: Secondary | ICD-10-CM | POA: Diagnosis not present

## 2016-10-01 DIAGNOSIS — C641 Malignant neoplasm of right kidney, except renal pelvis: Secondary | ICD-10-CM | POA: Diagnosis not present

## 2016-10-01 DIAGNOSIS — I10 Essential (primary) hypertension: Secondary | ICD-10-CM | POA: Diagnosis not present

## 2016-10-02 ENCOUNTER — Telehealth: Payer: Self-pay | Admitting: Medical Oncology

## 2016-10-02 DIAGNOSIS — G629 Polyneuropathy, unspecified: Secondary | ICD-10-CM | POA: Diagnosis not present

## 2016-10-02 DIAGNOSIS — M159 Polyosteoarthritis, unspecified: Secondary | ICD-10-CM | POA: Diagnosis not present

## 2016-10-02 DIAGNOSIS — C641 Malignant neoplasm of right kidney, except renal pelvis: Secondary | ICD-10-CM | POA: Diagnosis not present

## 2016-10-02 DIAGNOSIS — C8330 Diffuse large B-cell lymphoma, unspecified site: Secondary | ICD-10-CM | POA: Diagnosis not present

## 2016-10-02 DIAGNOSIS — I10 Essential (primary) hypertension: Secondary | ICD-10-CM | POA: Diagnosis not present

## 2016-10-02 DIAGNOSIS — C7800 Secondary malignant neoplasm of unspecified lung: Secondary | ICD-10-CM | POA: Diagnosis not present

## 2016-10-02 NOTE — Telephone Encounter (Signed)
Returned missed call to see how pt feeling after holding gabapentin and votrient . She has not noticed any difference in use of Lower extremity . She thinks her knees "may be a little better, but I am afraid to do any weight bearing . I reiterated to hold gabapentin and votrient through Monday and call back Monday for further instructions re medications.

## 2016-10-02 NOTE — Telephone Encounter (Signed)
Referred pt  For home health aide and social worker through [facility]

## 2016-10-03 DIAGNOSIS — I10 Essential (primary) hypertension: Secondary | ICD-10-CM | POA: Diagnosis not present

## 2016-10-03 DIAGNOSIS — C641 Malignant neoplasm of right kidney, except renal pelvis: Secondary | ICD-10-CM | POA: Diagnosis not present

## 2016-10-03 DIAGNOSIS — C8330 Diffuse large B-cell lymphoma, unspecified site: Secondary | ICD-10-CM | POA: Diagnosis not present

## 2016-10-03 DIAGNOSIS — G629 Polyneuropathy, unspecified: Secondary | ICD-10-CM | POA: Diagnosis not present

## 2016-10-03 DIAGNOSIS — M159 Polyosteoarthritis, unspecified: Secondary | ICD-10-CM | POA: Diagnosis not present

## 2016-10-03 DIAGNOSIS — C7800 Secondary malignant neoplasm of unspecified lung: Secondary | ICD-10-CM | POA: Diagnosis not present

## 2016-10-06 DIAGNOSIS — G629 Polyneuropathy, unspecified: Secondary | ICD-10-CM | POA: Diagnosis not present

## 2016-10-06 DIAGNOSIS — C7800 Secondary malignant neoplasm of unspecified lung: Secondary | ICD-10-CM | POA: Diagnosis not present

## 2016-10-06 DIAGNOSIS — C8330 Diffuse large B-cell lymphoma, unspecified site: Secondary | ICD-10-CM | POA: Diagnosis not present

## 2016-10-06 DIAGNOSIS — M159 Polyosteoarthritis, unspecified: Secondary | ICD-10-CM | POA: Diagnosis not present

## 2016-10-06 DIAGNOSIS — C641 Malignant neoplasm of right kidney, except renal pelvis: Secondary | ICD-10-CM | POA: Diagnosis not present

## 2016-10-06 DIAGNOSIS — I10 Essential (primary) hypertension: Secondary | ICD-10-CM | POA: Diagnosis not present

## 2016-10-06 NOTE — Telephone Encounter (Signed)
Pt called states" I have not taken the gabapentin and votrient, the results of not taking it is terrible anxiety and my hands are worse, a lot of nervousness.Should I go back on the gabapentin, should I go back gradually or full blast, when I am sitting in my wheelchair I sometimes have muscle spasms in my legs." Will review with MD and call pt with additional information.

## 2016-10-07 ENCOUNTER — Telehealth: Payer: Self-pay | Admitting: Medical Oncology

## 2016-10-07 DIAGNOSIS — G629 Polyneuropathy, unspecified: Secondary | ICD-10-CM | POA: Diagnosis not present

## 2016-10-07 DIAGNOSIS — C8330 Diffuse large B-cell lymphoma, unspecified site: Secondary | ICD-10-CM | POA: Diagnosis not present

## 2016-10-07 DIAGNOSIS — C7800 Secondary malignant neoplasm of unspecified lung: Secondary | ICD-10-CM | POA: Diagnosis not present

## 2016-10-07 DIAGNOSIS — C641 Malignant neoplasm of right kidney, except renal pelvis: Secondary | ICD-10-CM | POA: Diagnosis not present

## 2016-10-07 DIAGNOSIS — I10 Essential (primary) hypertension: Secondary | ICD-10-CM | POA: Diagnosis not present

## 2016-10-07 DIAGNOSIS — M159 Polyosteoarthritis, unspecified: Secondary | ICD-10-CM | POA: Diagnosis not present

## 2016-10-07 NOTE — Telephone Encounter (Signed)
Her hands are worse an she is working with PT /OT to  'get my legs back". She is feeling anxious since she is not taking the medication. Per Julien Nordmann I told her to restart her gabapentin and Votrient. Pt asking how to restart gabapentin. Per Cyril Mourning I instructed pt to take Gabapentin 2 capsules  HS x 3 days, ,then add am dose to HS dose x 3 days then add in pm dose x 3 days . Pt voices understanding. She requests rolling wheelchair rx faxed to [facility]. Faxed rx to [facility]

## 2016-10-08 DIAGNOSIS — I10 Essential (primary) hypertension: Secondary | ICD-10-CM | POA: Diagnosis not present

## 2016-10-08 DIAGNOSIS — M159 Polyosteoarthritis, unspecified: Secondary | ICD-10-CM | POA: Diagnosis not present

## 2016-10-08 DIAGNOSIS — C8330 Diffuse large B-cell lymphoma, unspecified site: Secondary | ICD-10-CM | POA: Diagnosis not present

## 2016-10-08 DIAGNOSIS — C7800 Secondary malignant neoplasm of unspecified lung: Secondary | ICD-10-CM | POA: Diagnosis not present

## 2016-10-08 DIAGNOSIS — G629 Polyneuropathy, unspecified: Secondary | ICD-10-CM | POA: Diagnosis not present

## 2016-10-08 DIAGNOSIS — C641 Malignant neoplasm of right kidney, except renal pelvis: Secondary | ICD-10-CM | POA: Diagnosis not present

## 2016-10-09 DIAGNOSIS — C7800 Secondary malignant neoplasm of unspecified lung: Secondary | ICD-10-CM | POA: Diagnosis not present

## 2016-10-09 DIAGNOSIS — M159 Polyosteoarthritis, unspecified: Secondary | ICD-10-CM | POA: Diagnosis not present

## 2016-10-09 DIAGNOSIS — I10 Essential (primary) hypertension: Secondary | ICD-10-CM | POA: Diagnosis not present

## 2016-10-09 DIAGNOSIS — G629 Polyneuropathy, unspecified: Secondary | ICD-10-CM | POA: Diagnosis not present

## 2016-10-09 DIAGNOSIS — C641 Malignant neoplasm of right kidney, except renal pelvis: Secondary | ICD-10-CM | POA: Diagnosis not present

## 2016-10-09 DIAGNOSIS — C8330 Diffuse large B-cell lymphoma, unspecified site: Secondary | ICD-10-CM | POA: Diagnosis not present

## 2016-10-13 DIAGNOSIS — C641 Malignant neoplasm of right kidney, except renal pelvis: Secondary | ICD-10-CM | POA: Diagnosis not present

## 2016-10-13 DIAGNOSIS — I10 Essential (primary) hypertension: Secondary | ICD-10-CM | POA: Diagnosis not present

## 2016-10-13 DIAGNOSIS — G629 Polyneuropathy, unspecified: Secondary | ICD-10-CM | POA: Diagnosis not present

## 2016-10-13 DIAGNOSIS — M159 Polyosteoarthritis, unspecified: Secondary | ICD-10-CM | POA: Diagnosis not present

## 2016-10-13 DIAGNOSIS — C8330 Diffuse large B-cell lymphoma, unspecified site: Secondary | ICD-10-CM | POA: Diagnosis not present

## 2016-10-13 DIAGNOSIS — C7800 Secondary malignant neoplasm of unspecified lung: Secondary | ICD-10-CM | POA: Diagnosis not present

## 2016-10-14 DIAGNOSIS — C7800 Secondary malignant neoplasm of unspecified lung: Secondary | ICD-10-CM | POA: Diagnosis not present

## 2016-10-14 DIAGNOSIS — M159 Polyosteoarthritis, unspecified: Secondary | ICD-10-CM | POA: Diagnosis not present

## 2016-10-14 DIAGNOSIS — G629 Polyneuropathy, unspecified: Secondary | ICD-10-CM | POA: Diagnosis not present

## 2016-10-14 DIAGNOSIS — C8330 Diffuse large B-cell lymphoma, unspecified site: Secondary | ICD-10-CM | POA: Diagnosis not present

## 2016-10-14 DIAGNOSIS — C641 Malignant neoplasm of right kidney, except renal pelvis: Secondary | ICD-10-CM | POA: Diagnosis not present

## 2016-10-14 DIAGNOSIS — I10 Essential (primary) hypertension: Secondary | ICD-10-CM | POA: Diagnosis not present

## 2016-10-15 DIAGNOSIS — I10 Essential (primary) hypertension: Secondary | ICD-10-CM | POA: Diagnosis not present

## 2016-10-15 DIAGNOSIS — C8330 Diffuse large B-cell lymphoma, unspecified site: Secondary | ICD-10-CM | POA: Diagnosis not present

## 2016-10-15 DIAGNOSIS — M159 Polyosteoarthritis, unspecified: Secondary | ICD-10-CM | POA: Diagnosis not present

## 2016-10-15 DIAGNOSIS — C7800 Secondary malignant neoplasm of unspecified lung: Secondary | ICD-10-CM | POA: Diagnosis not present

## 2016-10-15 DIAGNOSIS — G629 Polyneuropathy, unspecified: Secondary | ICD-10-CM | POA: Diagnosis not present

## 2016-10-15 DIAGNOSIS — C641 Malignant neoplasm of right kidney, except renal pelvis: Secondary | ICD-10-CM | POA: Diagnosis not present

## 2016-10-16 ENCOUNTER — Telehealth: Payer: Self-pay | Admitting: Medical Oncology

## 2016-10-16 DIAGNOSIS — I10 Essential (primary) hypertension: Secondary | ICD-10-CM | POA: Diagnosis not present

## 2016-10-16 DIAGNOSIS — G629 Polyneuropathy, unspecified: Secondary | ICD-10-CM | POA: Diagnosis not present

## 2016-10-16 DIAGNOSIS — C7800 Secondary malignant neoplasm of unspecified lung: Secondary | ICD-10-CM | POA: Diagnosis not present

## 2016-10-16 DIAGNOSIS — M159 Polyosteoarthritis, unspecified: Secondary | ICD-10-CM | POA: Diagnosis not present

## 2016-10-16 DIAGNOSIS — C8330 Diffuse large B-cell lymphoma, unspecified site: Secondary | ICD-10-CM | POA: Diagnosis not present

## 2016-10-16 DIAGNOSIS — C641 Malignant neoplasm of right kidney, except renal pelvis: Secondary | ICD-10-CM | POA: Diagnosis not present

## 2016-10-16 NOTE — Telephone Encounter (Signed)
HHRN called concerned about pt persistent falls and lack of independence , immobility at home.  She reports two more falls in past few days and injured her ankle .  PT/OT has been coming out.  I called pt.   Pt states she cannot bear weight due to ankle pain. She denies swelling or bruising. . She stays in her recliner 24/7 and  sleeps in recliner. She only gets up for toileting and only  if someone can lift her to the potty chair. She is wearing depends and wears them until someone -family or aide-comes  to see her.  Pt is now recliner -bound.  She is also taking Votrient. I talked to pt about her high risk of bed sores , infection in current state and when taking Votrient. . I encouraged pt to call EMS to go to ED for evaluation. She said she will talk to her daughter tomorrow about going to ED by EMS. I told her she can call 911 now. She said she will think about it.

## 2016-10-16 NOTE — Telephone Encounter (Signed)
She may need to come and see symptom management if she does not go to the hospital. She probably will need repeat MRI of the brain to rule out any brain metastasis.

## 2016-10-17 ENCOUNTER — Emergency Department (HOSPITAL_COMMUNITY): Payer: Medicare Other

## 2016-10-17 ENCOUNTER — Emergency Department (HOSPITAL_COMMUNITY)
Admission: EM | Admit: 2016-10-17 | Discharge: 2016-10-17 | Disposition: A | Discharge status: Home / Self Care | Payer: Medicare Other | Attending: Emergency Medicine | Admitting: Emergency Medicine

## 2016-10-17 DIAGNOSIS — I10 Essential (primary) hypertension: Secondary | ICD-10-CM | POA: Diagnosis not present

## 2016-10-17 DIAGNOSIS — S92514A Nondisplaced fracture of proximal phalanx of right lesser toe(s), initial encounter for closed fracture: Secondary | ICD-10-CM | POA: Diagnosis not present

## 2016-10-17 DIAGNOSIS — R9431 Abnormal electrocardiogram [ECG] [EKG]: Secondary | ICD-10-CM | POA: Diagnosis not present

## 2016-10-17 DIAGNOSIS — Z8673 Personal history of transient ischemic attack (TIA), and cerebral infarction without residual deficits: Secondary | ICD-10-CM | POA: Insufficient documentation

## 2016-10-17 DIAGNOSIS — Z87891 Personal history of nicotine dependence: Secondary | ICD-10-CM | POA: Insufficient documentation

## 2016-10-17 DIAGNOSIS — Z85118 Personal history of other malignant neoplasm of bronchus and lung: Secondary | ICD-10-CM | POA: Diagnosis not present

## 2016-10-17 DIAGNOSIS — Y939 Activity, unspecified: Secondary | ICD-10-CM | POA: Insufficient documentation

## 2016-10-17 DIAGNOSIS — W050XXA Fall from non-moving wheelchair, initial encounter: Secondary | ICD-10-CM | POA: Insufficient documentation

## 2016-10-17 DIAGNOSIS — E039 Hypothyroidism, unspecified: Secondary | ICD-10-CM | POA: Insufficient documentation

## 2016-10-17 DIAGNOSIS — N3090 Cystitis, unspecified without hematuria: Secondary | ICD-10-CM | POA: Diagnosis not present

## 2016-10-17 DIAGNOSIS — S99911A Unspecified injury of right ankle, initial encounter: Secondary | ICD-10-CM | POA: Diagnosis not present

## 2016-10-17 DIAGNOSIS — S92501A Displaced unspecified fracture of right lesser toe(s), initial encounter for closed fracture: Secondary | ICD-10-CM

## 2016-10-17 DIAGNOSIS — Z8572 Personal history of non-Hodgkin lymphomas: Secondary | ICD-10-CM | POA: Diagnosis not present

## 2016-10-17 DIAGNOSIS — C7800 Secondary malignant neoplasm of unspecified lung: Secondary | ICD-10-CM | POA: Diagnosis not present

## 2016-10-17 DIAGNOSIS — T148XXA Other injury of unspecified body region, initial encounter: Secondary | ICD-10-CM | POA: Diagnosis not present

## 2016-10-17 DIAGNOSIS — Z79899 Other long term (current) drug therapy: Secondary | ICD-10-CM | POA: Insufficient documentation

## 2016-10-17 DIAGNOSIS — G8911 Acute pain due to trauma: Secondary | ICD-10-CM | POA: Diagnosis not present

## 2016-10-17 DIAGNOSIS — Y999 Unspecified external cause status: Secondary | ICD-10-CM | POA: Diagnosis not present

## 2016-10-17 DIAGNOSIS — C8330 Diffuse large B-cell lymphoma, unspecified site: Secondary | ICD-10-CM | POA: Diagnosis not present

## 2016-10-17 DIAGNOSIS — Z7982 Long term (current) use of aspirin: Secondary | ICD-10-CM | POA: Diagnosis not present

## 2016-10-17 DIAGNOSIS — Z85528 Personal history of other malignant neoplasm of kidney: Secondary | ICD-10-CM | POA: Insufficient documentation

## 2016-10-17 DIAGNOSIS — Y929 Unspecified place or not applicable: Secondary | ICD-10-CM | POA: Diagnosis not present

## 2016-10-17 DIAGNOSIS — M25571 Pain in right ankle and joints of right foot: Secondary | ICD-10-CM | POA: Diagnosis not present

## 2016-10-17 DIAGNOSIS — S8002XA Contusion of left knee, initial encounter: Secondary | ICD-10-CM | POA: Diagnosis not present

## 2016-10-17 DIAGNOSIS — M7989 Other specified soft tissue disorders: Secondary | ICD-10-CM | POA: Diagnosis not present

## 2016-10-17 DIAGNOSIS — C641 Malignant neoplasm of right kidney, except renal pelvis: Secondary | ICD-10-CM | POA: Diagnosis not present

## 2016-10-17 DIAGNOSIS — M159 Polyosteoarthritis, unspecified: Secondary | ICD-10-CM | POA: Diagnosis not present

## 2016-10-17 DIAGNOSIS — S99921A Unspecified injury of right foot, initial encounter: Secondary | ICD-10-CM | POA: Diagnosis present

## 2016-10-17 DIAGNOSIS — S92511A Displaced fracture of proximal phalanx of right lesser toe(s), initial encounter for closed fracture: Secondary | ICD-10-CM | POA: Diagnosis not present

## 2016-10-17 DIAGNOSIS — S299XXA Unspecified injury of thorax, initial encounter: Secondary | ICD-10-CM | POA: Diagnosis not present

## 2016-10-17 DIAGNOSIS — G629 Polyneuropathy, unspecified: Secondary | ICD-10-CM | POA: Diagnosis not present

## 2016-10-17 LAB — CBC WITH DIFFERENTIAL/PLATELET
Basophils Absolute: 0 10*3/uL (ref 0.0–0.1)
Basophils Relative: 0 %
EOS ABS: 0 10*3/uL (ref 0.0–0.7)
Eosinophils Relative: 0 %
HCT: 34.5 % — ABNORMAL LOW (ref 36.0–46.0)
Hemoglobin: 11.6 g/dL — ABNORMAL LOW (ref 12.0–15.0)
Lymphocytes Relative: 25 %
Lymphs Abs: 0.6 10*3/uL — ABNORMAL LOW (ref 0.7–4.0)
MCH: 32.8 pg (ref 26.0–34.0)
MCHC: 33.6 g/dL (ref 30.0–36.0)
MCV: 97.5 fL (ref 78.0–100.0)
MONO ABS: 0.2 10*3/uL (ref 0.1–1.0)
Monocytes Relative: 10 %
NEUTROS PCT: 65 %
Neutro Abs: 1.4 10*3/uL — ABNORMAL LOW (ref 1.7–7.7)
PLATELETS: 114 10*3/uL — AB (ref 150–400)
RBC: 3.54 MIL/uL — ABNORMAL LOW (ref 3.87–5.11)
RDW: 16.9 % — ABNORMAL HIGH (ref 11.5–15.5)
WBC: 2.2 10*3/uL — AB (ref 4.0–10.5)

## 2016-10-17 LAB — URINALYSIS, ROUTINE W REFLEX MICROSCOPIC
BILIRUBIN URINE: NEGATIVE
Bacteria, UA: NONE SEEN
Glucose, UA: NEGATIVE mg/dL
Ketones, ur: NEGATIVE mg/dL
NITRITE: NEGATIVE
PH: 7 (ref 5.0–8.0)
Protein, ur: NEGATIVE mg/dL
SPECIFIC GRAVITY, URINE: 1.005 (ref 1.005–1.030)

## 2016-10-17 LAB — COMPREHENSIVE METABOLIC PANEL
ALBUMIN: 3.4 g/dL — AB (ref 3.5–5.0)
ALT: 20 U/L (ref 14–54)
AST: 38 U/L (ref 15–41)
Alkaline Phosphatase: 92 U/L (ref 38–126)
Anion gap: 12 (ref 5–15)
BUN: 15 mg/dL (ref 6–20)
CHLORIDE: 89 mmol/L — AB (ref 101–111)
CO2: 29 mmol/L (ref 22–32)
CREATININE: 0.96 mg/dL (ref 0.44–1.00)
Calcium: 9.9 mg/dL (ref 8.9–10.3)
GFR calc non Af Amer: 51 mL/min — ABNORMAL LOW (ref >60)
GFR, EST AFRICAN AMERICAN: 59 mL/min — AB (ref >60)
GLUCOSE: 96 mg/dL (ref 65–99)
Potassium: 3.9 mmol/L (ref 3.5–5.1)
SODIUM: 130 mmol/L — AB (ref 135–145)
Total Bilirubin: 1.1 mg/dL (ref 0.3–1.2)
Total Protein: 6.9 g/dL (ref 6.5–8.1)

## 2016-10-17 MED ORDER — FOSFOMYCIN TROMETHAMINE 3 G PO PACK
3.0000 g | PACK | Freq: Once | ORAL | Status: AC
  Administered 2016-10-17: 3 g via ORAL
  Filled 2016-10-17: qty 3

## 2016-10-17 NOTE — ED Provider Notes (Signed)
[facility] DEPT Provider Note   CSN: [id number] Arrival date & time: 10/17/16  1520     History   Chief Complaint Chief Complaint  Patient presents with  . ankle pain (right)    HPI Melissa Cross is a 83 y.o. 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 Patient notes that approximately 3 days ago while trying to transfer from her lift chair to her wheelchair, her right ankle inverted and she fell. She denies any head trauma or loss of consciousness. She was unable to bear weight on the foot after this. She is mainly feeling pain around the lateral aspect of the ankle as well as the second and third digits of the toes. She has swelling and bruising of the dorsal aspect of the foot just proximal to the 2nd-4th toes. She denies any decreased range of motion but does note that it is painful with range of motion. Prior to one month ago the patient was able to ambulate while using a walker. She notes over the past few months she has had progressive bilateral leg weakness and poor balance causing her to only be able to stand and walk with the assistance of her walker for a few minutes. One month ago she fell while using her walker trying to get onto the commode. She states this is due to feeling "unbalanced". After this the patient has not been unable to walk with the assistance of a walker and has had to use a wheelchair. The patient is followed by Dr. Julien Nordmann and on oral Votrient chemotherapy. Denies CP, SOB, lightheadedness preceding the falls. Denies HA, fever, N/V, cough, hemoptysis. Only new medication is Neurontin which she was placed on a few months ago. No oral anticoagulation use.    HPI  Past Medical History:  Diagnosis Date  . Anemia   . Arthritis    knee /hip  . Blood transfusion   .  Cancer ([place])    kidney cancer, NHL   . Cancer [facility])    last chemo 11/04/10   . Cataract    right  . Glaucoma   . Headache(784.0)    hx of migraines  . History of bladder infections   . History of blood transfusion   . History of shingles   . Hypercholesteremia    takes Zocor daily  . Hypertension    takes Nadolol daily  . Hypothyroid   . Hypothyroidism    takes Synthroid daily  . Non Hodgkin's lymphoma ([place])   . Peripheral neuropathy   . PONV (postoperative nausea and vomiting)   . Renal cell carcinoma   . Scoliosis   . Shortness of breath    with exertion  . Stroke ([place])    hx of TIA  . Thrush 12/17/2010  . TIA (transient ischemic attack)     Patient Active Problem List   Diagnosis Date Noted  . Neuropathy 05/19/2016  . Port catheter in place 08/23/2015  . Encounter for antineoplastic chemotherapy 06/28/2014  . Hyponatremia 06/10/2013  . Hypokalemia 06/10/2013  . UTI (lower urinary tract infection) 06/10/2013  . Essential hypertension 06/10/2013  . Malnutrition of moderate degree ([place]) 06/10/2013  . Pulmonary nodule 08/06/2012  . Renal cell carcinoma of right kidney ([place]) 09/30/2011  . Thrush 12/17/2010  . Lymphoma, large-cell, diffuse ([place]) 11/13/2010    Past Surgical History:  Procedure  Laterality Date  . left cataract removed    . MOUTH SURGERY    . NEPHRECTOMY     Right Side  . PORTACATH PLACEMENT     right subclavian  . TONSILLECTOMY     as a child  . VIDEO BRONCHOSCOPY WITH ENDOBRONCHIAL NAVIGATION N/A 08/06/2012   Procedure: VIDEO BRONCHOSCOPY WITH ENDOBRONCHIAL NAVIGATION;  Surgeon: Collene Gobble, MD;  Location: [facility];  Service: Thoracic;  Laterality: N/A;  . VIDEO BRONCHOSCOPY WITH ENDOBRONCHIAL ULTRASOUND N/A 08/06/2012   Procedure: VIDEO BRONCHOSCOPY WITH ENDOBRONCHIAL ULTRASOUND;  Surgeon: Collene Gobble, MD;  Location: [place] [facility];  Service: Thoracic;  Laterality: N/A;    OB History    No data available       Home Medications    Prior to  Admission medications   Medication Sig Start Date End Date Taking? Authorizing Provider  amLODipine (NORVASC) 5 MG tablet Take 1 tablet (5 mg total) by mouth daily. 09/30/12  Yes Carlton Adam, PA-C  aspirin EC 81 MG tablet Take 81 mg by mouth at bedtime.    Yes [provider]  Calcium Citrate (CITRACAL PO) Take 1 tablet by mouth 3 (three) times a week.    Yes [provider]  Calcium-Magnesium-Vitamin D (CITRACAL CALCIUM+D) 600-40-500 MG-MG-UNIT TB24 Take 630 mg by mouth.   Yes [provider]  cholecalciferol (VITAMIN D) 400 UNITS TABS Take 400 Units by mouth daily.     Yes [provider]  gabapentin (NEURONTIN) 100 MG capsule Take 1 capsule in am, 1 in pm, and 2 at bedtime. 09/15/16  Yes Curt Bears, MD  levothyroxine (SYNTHROID, LEVOTHROID) 50 MCG tablet Take 50 mcg by mouth every morning.    Yes [provider]  Multiple Vitamin (MULITIVITAMIN WITH MINERALS) TABS Take 1 tablet by mouth daily.     Yes [provider]  nadolol (CORGARD) 40 MG tablet Take 40 mg by mouth at bedtime.    Yes [provider]  pazopanib (VOTRIENT) 200 MG tablet Take 2 tablets (400 mg total) by mouth daily. Take on an empty stomach. 01/04/16  Yes Curt Bears, MD  prednisoLONE acetate (PRED FORTE) 1 % ophthalmic suspension instill 1 drop into right eye once daily 02/04/16  Yes [provider]  spironolactone (ALDACTONE) 25 MG tablet take 1/2 tablet by mouth once daily with food 02/27/16  Yes [provider]  timolol (BETIMOL) 0.5 % ophthalmic solution Place 1 drop into the left eye 2 (two) times daily.   Yes [provider]  vitamin B-12 (CYANOCOBALAMIN) 1000 MCG tablet Place 1,000 mcg under the tongue daily.    Yes [provider]  Karen Kays TO Henreitta Cea  Use with ambulation 08/16/14   Curt Bears, MD    Family History Family History  Problem Relation Age of Onset  . Colon cancer Father   . Stroke  Mother     Social History Social History  Substance Use Topics  . Smoking status: Former Smoker    Packs/day: 0.50    Years: 25.00    Quit date: 01/07/1992  . Smokeless tobacco: Never Used     Comment: quit many yrs ago  . Alcohol use No     Allergies   Crab [shellfish allergy] and Dilaudid [hydromorphone hcl]   Review of Systems Review of Systems  All other systems reviewed and are negative.    Physical Exam Updated Vital Signs BP (!) 150/70 (BP Location: Left Arm)   Pulse 66   Temp 97.7 F (  36.5 C) (Oral)   Resp 18   SpO2 99%   Physical Exam  Constitutional: She appears well-developed.  Elderly female, thin. Resting comfortably  HENT:  Head: Normocephalic and atraumatic.  Right Ear: External ear normal.  Left Ear: External ear normal.  Nose: Nose normal.  Mouth/Throat: Uvula is midline, oropharynx is clear and moist and mucous membranes are normal. No tonsillar exudate.  Eyes: Pupils are equal, round, and reactive to light. Right eye exhibits no discharge. Left eye exhibits no discharge. No scleral icterus.  Neck: Trachea normal and normal range of motion. Neck supple. No spinous process tenderness present. Carotid bruit is not present. No neck rigidity. Normal range of motion present.  Cardiovascular: Normal rate, regular rhythm and intact distal pulses.   No murmur heard. Pulses:      Radial pulses are 2+ on the right side, and 2+ on the left side.       Dorsalis pedis pulses are 2+ on the right side, and 2+ on the left side.       Posterior tibial pulses are 2+ on the right side, and 2+ on the left side.  No lower extremity swelling or edema. Calves symmetric in size bilaterally.  Pulmonary/Chest: Effort normal and breath sounds normal. She exhibits no tenderness.  Abdominal: Soft. Bowel sounds are normal. There is no tenderness. There is no rebound and no guarding.  Musculoskeletal: She exhibits no edema.       Right ankle: She exhibits swelling and  ecchymosis. She exhibits normal range of motion and no deformity. Tenderness. Lateral malleolus tenderness found. Achilles tendon normal.       Right foot: There is tenderness, bony tenderness and swelling. There is normal range of motion and normal capillary refill.       Feet:  Neurovascularly intact distally. Compartments soft above and below affected joint.   Lymphadenopathy:    She has no cervical adenopathy.  Neurological: She is alert.  Mental Status:  Alert, oriented, thought content appropriate, able to give a coherent history. Speech fluent without evidence of aphasia. Able to follow 2 step commands without difficulty.  Cranial Nerves:  II:  Peripheral visual fields grossly normal, pupils equal, round, reactive to light III,IV, VI: ptosis not present, extra-ocular motions intact bilaterally  V,VII: smile symmetric, eyebrows raise symmetric, facial light touch sensation equal VIII: hearing grossly normal to voice  X: uvula elevates symmetrically  XI: bilateral shoulder shrug symmetric and strong XII: midline tongue extension without fassiculations Motor:  Able and equal upper extremities strength for age including grip strength. Decreased hip flexion strength Right compared to Left. Equal dorsiflexion/plantar flexion b/l.  Sensory: Sensation intact to light touch in all extremities.  Cerebellar: normal finger-to-nose with bilateral upper extremities. No pronator drift.  CV: distal pulses palpable throughout    Skin: Skin is warm and dry. No rash noted. She is not diaphoretic.  Psychiatric: She has a normal mood and affect.  Nursing note and vitals reviewed.    ED Treatments / Results  Labs (all labs ordered are listed, but only abnormal results are displayed) Labs Reviewed  URINALYSIS, ROUTINE W REFLEX MICROSCOPIC - Abnormal; Notable for the following:       Result Value   Hgb urine dipstick SMALL (*)    Leukocytes, UA LARGE (*)    Squamous Epithelial / LPF 0-5 (*)     All other components within normal limits  CBC WITH DIFFERENTIAL/PLATELET - Abnormal; Notable for the following:    WBC 2.2 (*)  RBC 3.54 (*)    Hemoglobin 11.6 (*)    HCT 34.5 (*)    RDW 16.9 (*)    Platelets 114 (*)    Neutro Abs 1.4 (*)    Lymphs Abs 0.6 (*)    All other components within normal limits  COMPREHENSIVE METABOLIC PANEL - Abnormal; Notable for the following:    Sodium 130 (*)    Chloride 89 (*)    Albumin 3.4 (*)    GFR calc non Af Amer 51 (*)    GFR calc Af Amer 59 (*)    All other components within normal limits    EKG  EKG Interpretation  Date/Time:  Friday October 17 2016 17:33:03 EDT Ventricular Rate:  62 PR Interval:    QRS Duration: 98 QT Interval:  410 QTC Calculation: 417 R Axis:   -20 Text Interpretation:  Sinus rhythm Borderline left axis deviation Baseline wander in lead(s) V1 T wave abnormality Abnormal ekg Confirmed by Carmin Muskrat [phone]) on 10/17/2016 5:36:36 PM       Radiology Dg Chest 2 View  Result Date: 10/17/2016 CLINICAL DATA:  Patient fell 3 days prior.  Injury to ankle. EXAM: CHEST  2 VIEW COMPARISON:  7/11/8 FINDINGS: Port in the RIGHT chest with tip in distal SVC. Normal cardiac silhouette. Lungs are clear. Atherosclerotic calcification aorta. No evidence of thoracic sinus IMPRESSION: No acute cardiopulmonary process. Aortic Atherosclerosis (ICD10-I70.0). Electronically Signed   By: Suzy Bouchard M.D.   On: 10/17/2016 18:08   Dg Ankle Complete Right  Result Date: 10/17/2016 CLINICAL DATA:  [place] at home 3 days ago injuring RIGHT foot and ankle, unable to bear weight, bruising especially dorsal to the distal metatarsals and at lateral RIGHT ankle, swelling EXAM: RIGHT ANKLE - COMPLETE 3+ VIEW COMPARISON:  None FINDINGS: Minimal lateral soft tissue swelling. Bones demineralized. Joint spaces preserved. No acute fracture, dislocation, or bone destruction. IMPRESSION: No acute osseous abnormalities. Electronically Signed   By:  Lavonia Dana M.D.   On: 10/17/2016 16:58   Dg Foot Complete Right  Result Date: 10/17/2016 CLINICAL DATA:  [place] at home 3 days ago injuring RIGHT foot and ankle, unable to bear weight, bruising especially dorsal to the distal metatarsals and at lateral RIGHT ankle, swelling EXAM: RIGHT FOOT COMPLETE - 3+ VIEW COMPARISON:  None FINDINGS: Diffuse osseous demineralization. Joint spaces preserved. Minimally distracted intra-articular fracture at head of proximal phalanx second toe. No additional fracture, dislocation, or bone destruction. Soft tissue swelling over the dorsal aspects of the distal metatarsals and MTP joints. IMPRESSION: Intra-articular fracture at head of proximal phalanx RIGHT second toe. No definite additional acute bony abnormalities. Electronically Signed   By: Lavonia Dana M.D.   On: 10/17/2016 17:00    Procedures Procedures (including critical care time)  Medications Ordered in ED Medications  fosfomycin (MONUROL) packet 3 g (3 g Oral Given 10/17/16 1936)     Initial Impression / Assessment and Plan / ED Course  I have reviewed the triage vital signs and the nursing notes.  Pertinent labs & imaging results that were available during my care of the patient were reviewed by me and considered in my medical decision making (see chart for details).      This 83 year old female with history of cancer followed by Dr. Julien Nordmann with history of cancer presenting with right ankle/foot pain after mechanical fall, and progressive weakness. Vital signs without tachycardia, tachypnea, fever, hypoxia or hypotension. The patient exam shows right foot swelling and bruising. TTP over the  2nd and 3rd digits primarily as well as lateral malleoulus. Will order xray to evaluate. Will order ECG, blood work, UA and CXR to evaluate for causes of weakness. Neurologic exam reassuring. Patient did demonstrate mild weakness right hip flexion compared to left but equal plantar flexion and dorsiflexion b/l.  Neuro exam otherwise reassuring.   Ankle xray unremarkable. Foot xray with intra-articular fracture at the head of the proximal phalanx of the right second toe. Will place in post-op shoe and have patient follow up with ortho for this. CMP with elecrolytes at baseline. No AKI. Glucose normal. No anion gap. CBC with Hgb near patient baseline. CXR with no acute cardiopulmonary changes. ECG with sinus rhythm. Ua with signs of infection. No N/V, CVA TTP, or fever to suggest pyelonephritis. Will treat with fosfomycin in the department.   The evaluation does not show pathology that would require ongoing emergent intervention or inpatient treatment. I advised the patient to follow-up with PCP vs Oncology this week. I advised the patient to return to the emergency department with new or worsening symptoms or new concerns. Specific return precautions discussed. The patient verbalized understanding and agreement with plan. All questions answered. No further questions at this time. The patient is hemodynamically stable, mentating appropriately and appears safe for discharge.  Patient case seen and discussed with Dr. Vanita Panda who is in agreement with plan.   Final Clinical Impressions(s) / ED Diagnoses   Final diagnoses:  Closed fracture of phalanx of right second toe, initial encounter  Cystitis    New Prescriptions New Prescriptions   No medications on file     Lorelle Gibbs 10/17/16 2136    Carmin Muskrat, MD 10/18/16 0000

## 2016-10-17 NOTE — Discharge Instructions (Signed)
Please read and follow all provided instructions.  Your diagnoses today include:  1. Closed fracture of phalanx of right second toe, initial encounter   2. Cystitis     Tests performed today include: Vital signs. See below for your results today.  Blood work - reassuring  Urine - this showed evidence of infection. You were treated for this in the department  Xrays - showed evidence of fracture of the second toe of the right foot   Home care instructions:  Follow any educational materials contained in this packet.  Follow-up instructions: Please follow-up with your primary care provider and/or oncology for further evaluation of symptoms and treatment. I would like you to follow up with ortho for your fracture. I have provided you a referral on the first sheet of this packet.   Return instructions:  Please return to the Emergency Department if you do not get better, if you get worse, or new symptoms OR  - Fever (temperature greater than 101.56F)  - Bleeding that does not stop with holding pressure to the area    -Severe pain (please note that you may be more sore the day after your accident)  - Chest Pain  - Difficulty breathing  - Severe nausea or vomiting  - Inability to tolerate food and liquids  - Passing out  - Skin becoming red around your wounds  - Change in mental status (confusion or lethargy)  - New numbness or weakness    Please return if you have any other emergent concerns.  Additional Information:  Your vital signs today were: BP (!) 153/75 (BP Location: Left Arm)    Pulse 63    Temp 97.7 F (36.5 C) (Oral)    Resp 18    SpO2 98%  If your blood pressure (BP) was elevated above 135/85 this visit, please have this repeated by your doctor within one month.

## 2016-10-17 NOTE — ED Triage Notes (Signed)
Per EMS, pt fell approx. 3 days ago and injured her right ankle/foot. Pt reports being unable to put weight on that foot. EMS reports swelling and bruising to the affected area. Pt is AO x4 and usually walks without an assistive device.

## 2016-10-20 ENCOUNTER — Other Ambulatory Visit: Payer: Self-pay | Admitting: Medical Oncology

## 2016-10-20 DIAGNOSIS — C641 Malignant neoplasm of right kidney, except renal pelvis: Secondary | ICD-10-CM | POA: Diagnosis not present

## 2016-10-20 DIAGNOSIS — C7800 Secondary malignant neoplasm of unspecified lung: Secondary | ICD-10-CM | POA: Diagnosis not present

## 2016-10-20 DIAGNOSIS — M159 Polyosteoarthritis, unspecified: Secondary | ICD-10-CM | POA: Diagnosis not present

## 2016-10-20 DIAGNOSIS — G629 Polyneuropathy, unspecified: Secondary | ICD-10-CM | POA: Diagnosis not present

## 2016-10-20 DIAGNOSIS — I10 Essential (primary) hypertension: Secondary | ICD-10-CM | POA: Diagnosis not present

## 2016-10-20 DIAGNOSIS — C8330 Diffuse large B-cell lymphoma, unspecified site: Secondary | ICD-10-CM | POA: Diagnosis not present

## 2016-10-20 NOTE — Telephone Encounter (Signed)
Per Julien Nordmann I told pt to call Dr Diona Fanti.

## 2016-10-20 NOTE — Telephone Encounter (Signed)
Pt at home . She did go to ED and has a fracture in toe and a "urinary infection" -UA done). She said she was not prescribed any antibiotic. She still has dysuria. She started taking an old rx  "Sulfameth" only 5 tablets  ( from [place]).   Does Mohamed want to prescribe anything different?  She still has leg weakness and requires lifting to get to potty chair. I asked her to lift her legs in the chair and she said she could.

## 2016-10-22 ENCOUNTER — Telehealth: Payer: Self-pay | Admitting: Medical Oncology

## 2016-10-22 DIAGNOSIS — C8330 Diffuse large B-cell lymphoma, unspecified site: Secondary | ICD-10-CM | POA: Diagnosis not present

## 2016-10-22 DIAGNOSIS — C7800 Secondary malignant neoplasm of unspecified lung: Secondary | ICD-10-CM | POA: Diagnosis not present

## 2016-10-22 DIAGNOSIS — M159 Polyosteoarthritis, unspecified: Secondary | ICD-10-CM | POA: Diagnosis not present

## 2016-10-22 DIAGNOSIS — G629 Polyneuropathy, unspecified: Secondary | ICD-10-CM | POA: Diagnosis not present

## 2016-10-22 DIAGNOSIS — I10 Essential (primary) hypertension: Secondary | ICD-10-CM | POA: Diagnosis not present

## 2016-10-22 DIAGNOSIS — C641 Malignant neoplasm of right kidney, except renal pelvis: Secondary | ICD-10-CM | POA: Diagnosis not present

## 2016-10-22 NOTE — Telephone Encounter (Signed)
F/u call re ED visit and fracture of toe on right foot. . Pt states she is doing ok. Cannot bear weight on right foot and needs full assist to get to [facility] . She denies pain .She is still waiting for a ramp to be built. She has multiple home care providers checking in on her. I confirmed her next appt with [facility] and she is planning to at the appt. . I told her he may order an MRI brain and to call back for more falls or concerns about her mobility.

## 2016-10-23 DIAGNOSIS — C7800 Secondary malignant neoplasm of unspecified lung: Secondary | ICD-10-CM | POA: Diagnosis not present

## 2016-10-23 DIAGNOSIS — G629 Polyneuropathy, unspecified: Secondary | ICD-10-CM | POA: Diagnosis not present

## 2016-10-23 DIAGNOSIS — M159 Polyosteoarthritis, unspecified: Secondary | ICD-10-CM | POA: Diagnosis not present

## 2016-10-23 DIAGNOSIS — I10 Essential (primary) hypertension: Secondary | ICD-10-CM | POA: Diagnosis not present

## 2016-10-23 DIAGNOSIS — C641 Malignant neoplasm of right kidney, except renal pelvis: Secondary | ICD-10-CM | POA: Diagnosis not present

## 2016-10-23 DIAGNOSIS — C8330 Diffuse large B-cell lymphoma, unspecified site: Secondary | ICD-10-CM | POA: Diagnosis not present

## 2016-10-24 DIAGNOSIS — M159 Polyosteoarthritis, unspecified: Secondary | ICD-10-CM | POA: Diagnosis not present

## 2016-10-24 DIAGNOSIS — C7800 Secondary malignant neoplasm of unspecified lung: Secondary | ICD-10-CM | POA: Diagnosis not present

## 2016-10-24 DIAGNOSIS — G629 Polyneuropathy, unspecified: Secondary | ICD-10-CM | POA: Diagnosis not present

## 2016-10-24 DIAGNOSIS — C641 Malignant neoplasm of right kidney, except renal pelvis: Secondary | ICD-10-CM | POA: Diagnosis not present

## 2016-10-24 DIAGNOSIS — I10 Essential (primary) hypertension: Secondary | ICD-10-CM | POA: Diagnosis not present

## 2016-10-24 DIAGNOSIS — C8330 Diffuse large B-cell lymphoma, unspecified site: Secondary | ICD-10-CM | POA: Diagnosis not present

## 2016-10-27 ENCOUNTER — Telehealth: Payer: Self-pay | Admitting: *Deleted

## 2016-10-27 ENCOUNTER — Other Ambulatory Visit: Payer: Self-pay | Admitting: Internal Medicine

## 2016-10-27 ENCOUNTER — Telehealth: Payer: Self-pay | Admitting: Emergency Medicine

## 2016-10-27 DIAGNOSIS — C641 Malignant neoplasm of right kidney, except renal pelvis: Secondary | ICD-10-CM | POA: Diagnosis not present

## 2016-10-27 DIAGNOSIS — C8330 Diffuse large B-cell lymphoma, unspecified site: Secondary | ICD-10-CM | POA: Diagnosis not present

## 2016-10-27 DIAGNOSIS — C7800 Secondary malignant neoplasm of unspecified lung: Secondary | ICD-10-CM | POA: Diagnosis not present

## 2016-10-27 DIAGNOSIS — I10 Essential (primary) hypertension: Secondary | ICD-10-CM | POA: Diagnosis not present

## 2016-10-27 DIAGNOSIS — G629 Polyneuropathy, unspecified: Secondary | ICD-10-CM | POA: Diagnosis not present

## 2016-10-27 DIAGNOSIS — M159 Polyosteoarthritis, unspecified: Secondary | ICD-10-CM | POA: Diagnosis not present

## 2016-10-27 NOTE — Telephone Encounter (Signed)
"  This is Melissa Cross, how do I go about scheduling the brain MRI for my mom per Dr. Julien Nordmann."  No orders at this time.  Provided Central radiology scheduling number.   "Please call me when ordered so I know to call Central scheduling.    No further falls.    Lots of concerns about limited mobility and stroke.  History of TIA twenty years ago.    She's immobile and cannot stand on her own.    Remains in a lift chair and cannot move or get to the bathroom without assistance.  Sits in lift chair to potty.    Having slurred speech.   Significant diminished memory over the last few weeks  Hard time formulating sentences.   Yes she went to ED 10-17-2016 but the fall was about a week before this visit.  These symptoms have ben progressive but the fall was due to weakness in her legs, I believe she was trying to get from her lift chair to use a walker."       Routing call information to collaborative nurse and provider for review.  Further patient communication through collaborative nurse.

## 2016-10-27 NOTE — Telephone Encounter (Signed)
Pt and daughter called, left voicemail questioning MRI prior to MD visit.  Reviewed with MD Mphamed, returned call to pt.  Dicussed if continues to have falls MRI will be ordered.  Pt advised "I'd like to have an MRI before I make the decision to go to hospice".  Recommended pt and family come to next office visit on 11/7 to discuss all concerns and treatment options.  No further questions.

## 2016-10-27 NOTE — Telephone Encounter (Signed)
I will order MRI brain because of their concern. Thank you.

## 2016-10-28 ENCOUNTER — Telehealth: Payer: Self-pay

## 2016-10-28 NOTE — Telephone Encounter (Signed)
Spoke with [name], provided Radiology Central Scheduling number to call to proceed with scheduling.  "Mom's schedule is open they just need to call me to coordinate transportation." Encouraged to proceed with calling as availability between now and October 29 th expected date of completion could decrease quickly.

## 2016-10-28 NOTE — Telephone Encounter (Signed)
Ok for OT for pt 2 x a week for an additional 4 weeks and MRI ordered.

## 2016-10-28 NOTE — Telephone Encounter (Signed)
Jen Mow SW with [facility] called for order for additional visit in November. VO given per Dr Julien Nordmann.

## 2016-10-29 DIAGNOSIS — C8330 Diffuse large B-cell lymphoma, unspecified site: Secondary | ICD-10-CM | POA: Diagnosis not present

## 2016-10-29 DIAGNOSIS — C641 Malignant neoplasm of right kidney, except renal pelvis: Secondary | ICD-10-CM | POA: Diagnosis not present

## 2016-10-29 DIAGNOSIS — G629 Polyneuropathy, unspecified: Secondary | ICD-10-CM | POA: Diagnosis not present

## 2016-10-29 DIAGNOSIS — M159 Polyosteoarthritis, unspecified: Secondary | ICD-10-CM | POA: Diagnosis not present

## 2016-10-29 DIAGNOSIS — C7800 Secondary malignant neoplasm of unspecified lung: Secondary | ICD-10-CM | POA: Diagnosis not present

## 2016-10-29 DIAGNOSIS — I10 Essential (primary) hypertension: Secondary | ICD-10-CM | POA: Diagnosis not present

## 2016-10-30 DIAGNOSIS — C641 Malignant neoplasm of right kidney, except renal pelvis: Secondary | ICD-10-CM | POA: Diagnosis not present

## 2016-10-30 DIAGNOSIS — I10 Essential (primary) hypertension: Secondary | ICD-10-CM | POA: Diagnosis not present

## 2016-10-30 DIAGNOSIS — M159 Polyosteoarthritis, unspecified: Secondary | ICD-10-CM | POA: Diagnosis not present

## 2016-10-30 DIAGNOSIS — C8330 Diffuse large B-cell lymphoma, unspecified site: Secondary | ICD-10-CM | POA: Diagnosis not present

## 2016-10-30 DIAGNOSIS — G629 Polyneuropathy, unspecified: Secondary | ICD-10-CM | POA: Diagnosis not present

## 2016-10-30 DIAGNOSIS — C7800 Secondary malignant neoplasm of unspecified lung: Secondary | ICD-10-CM | POA: Diagnosis not present

## 2016-10-31 ENCOUNTER — Telehealth: Payer: Self-pay | Admitting: Internal Medicine

## 2016-10-31 DIAGNOSIS — I10 Essential (primary) hypertension: Secondary | ICD-10-CM | POA: Diagnosis not present

## 2016-10-31 DIAGNOSIS — C8330 Diffuse large B-cell lymphoma, unspecified site: Secondary | ICD-10-CM | POA: Diagnosis not present

## 2016-10-31 DIAGNOSIS — C641 Malignant neoplasm of right kidney, except renal pelvis: Secondary | ICD-10-CM | POA: Diagnosis not present

## 2016-10-31 DIAGNOSIS — C7800 Secondary malignant neoplasm of unspecified lung: Secondary | ICD-10-CM | POA: Diagnosis not present

## 2016-10-31 DIAGNOSIS — G629 Polyneuropathy, unspecified: Secondary | ICD-10-CM | POA: Diagnosis not present

## 2016-10-31 DIAGNOSIS — M159 Polyosteoarthritis, unspecified: Secondary | ICD-10-CM | POA: Diagnosis not present

## 2016-10-31 NOTE — Telephone Encounter (Signed)
Lvm for pt's son [name] (at pt's request) advising 11/7 appt moved to 11/12 at 1.15pm with [facility] due to md admin time on 11/7.

## 2016-11-01 ENCOUNTER — Encounter (HOSPITAL_COMMUNITY): Payer: Self-pay | Admitting: Emergency Medicine

## 2016-11-01 ENCOUNTER — Inpatient Hospital Stay (HOSPITAL_COMMUNITY)
Admission: EM | Admit: 2016-11-01 | Discharge: 2016-11-06 | DRG: 686 | Disposition: A | Discharge status: Hospice | Payer: Medicare Other | Attending: Family Medicine | Admitting: Family Medicine

## 2016-11-01 ENCOUNTER — Emergency Department (HOSPITAL_COMMUNITY): Payer: Medicare Other

## 2016-11-01 DIAGNOSIS — H409 Unspecified glaucoma: Secondary | ICD-10-CM | POA: Diagnosis present

## 2016-11-01 DIAGNOSIS — E860 Dehydration: Secondary | ICD-10-CM | POA: Diagnosis present

## 2016-11-01 DIAGNOSIS — I10 Essential (primary) hypertension: Secondary | ICD-10-CM | POA: Diagnosis present

## 2016-11-01 DIAGNOSIS — Z823 Family history of stroke: Secondary | ICD-10-CM

## 2016-11-01 DIAGNOSIS — Z515 Encounter for palliative care: Secondary | ICD-10-CM | POA: Diagnosis not present

## 2016-11-01 DIAGNOSIS — D6481 Anemia due to antineoplastic chemotherapy: Secondary | ICD-10-CM | POA: Diagnosis present

## 2016-11-01 DIAGNOSIS — Z8619 Personal history of other infectious and parasitic diseases: Secondary | ICD-10-CM

## 2016-11-01 DIAGNOSIS — Z885 Allergy status to narcotic agent status: Secondary | ICD-10-CM

## 2016-11-01 DIAGNOSIS — E7800 Pure hypercholesterolemia, unspecified: Secondary | ICD-10-CM | POA: Diagnosis present

## 2016-11-01 DIAGNOSIS — E039 Hypothyroidism, unspecified: Secondary | ICD-10-CM | POA: Diagnosis present

## 2016-11-01 DIAGNOSIS — R5081 Fever presenting with conditions classified elsewhere: Secondary | ICD-10-CM | POA: Diagnosis present

## 2016-11-01 DIAGNOSIS — Z7952 Long term (current) use of systemic steroids: Secondary | ICD-10-CM

## 2016-11-01 DIAGNOSIS — N179 Acute kidney failure, unspecified: Secondary | ICD-10-CM | POA: Diagnosis present

## 2016-11-01 DIAGNOSIS — Z66 Do not resuscitate: Secondary | ICD-10-CM | POA: Diagnosis not present

## 2016-11-01 DIAGNOSIS — R4020 Unspecified coma: Secondary | ICD-10-CM | POA: Diagnosis not present

## 2016-11-01 DIAGNOSIS — C8590 Non-Hodgkin lymphoma, unspecified, unspecified site: Secondary | ICD-10-CM | POA: Diagnosis not present

## 2016-11-01 DIAGNOSIS — T451X5A Adverse effect of antineoplastic and immunosuppressive drugs, initial encounter: Secondary | ICD-10-CM | POA: Diagnosis present

## 2016-11-01 DIAGNOSIS — Z7982 Long term (current) use of aspirin: Secondary | ICD-10-CM

## 2016-11-01 DIAGNOSIS — E43 Unspecified severe protein-calorie malnutrition: Secondary | ICD-10-CM | POA: Diagnosis present

## 2016-11-01 DIAGNOSIS — N390 Urinary tract infection, site not specified: Secondary | ICD-10-CM | POA: Diagnosis present

## 2016-11-01 DIAGNOSIS — R531 Weakness: Secondary | ICD-10-CM | POA: Diagnosis not present

## 2016-11-01 DIAGNOSIS — D61818 Other pancytopenia: Secondary | ICD-10-CM | POA: Diagnosis present

## 2016-11-01 DIAGNOSIS — M1990 Unspecified osteoarthritis, unspecified site: Secondary | ICD-10-CM | POA: Diagnosis present

## 2016-11-01 DIAGNOSIS — Z9181 History of falling: Secondary | ICD-10-CM

## 2016-11-01 DIAGNOSIS — Z91013 Allergy to seafood: Secondary | ICD-10-CM

## 2016-11-01 DIAGNOSIS — C641 Malignant neoplasm of right kidney, except renal pelvis: Principal | ICD-10-CM | POA: Diagnosis present

## 2016-11-01 DIAGNOSIS — Z6826 Body mass index (BMI) 26.0-26.9, adult: Secondary | ICD-10-CM

## 2016-11-01 DIAGNOSIS — R627 Adult failure to thrive: Secondary | ICD-10-CM

## 2016-11-01 DIAGNOSIS — Z8673 Personal history of transient ischemic attack (TIA), and cerebral infarction without residual deficits: Secondary | ICD-10-CM

## 2016-11-01 DIAGNOSIS — Z87891 Personal history of nicotine dependence: Secondary | ICD-10-CM

## 2016-11-01 DIAGNOSIS — G629 Polyneuropathy, unspecified: Secondary | ICD-10-CM | POA: Diagnosis present

## 2016-11-01 DIAGNOSIS — I959 Hypotension, unspecified: Secondary | ICD-10-CM | POA: Diagnosis present

## 2016-11-01 DIAGNOSIS — E871 Hypo-osmolality and hyponatremia: Secondary | ICD-10-CM | POA: Diagnosis not present

## 2016-11-01 DIAGNOSIS — D702 Other drug-induced agranulocytosis: Secondary | ICD-10-CM | POA: Diagnosis present

## 2016-11-01 DIAGNOSIS — Z79899 Other long term (current) drug therapy: Secondary | ICD-10-CM

## 2016-11-01 DIAGNOSIS — Z87440 Personal history of urinary (tract) infections: Secondary | ICD-10-CM

## 2016-11-01 DIAGNOSIS — M419 Scoliosis, unspecified: Secondary | ICD-10-CM | POA: Diagnosis present

## 2016-11-01 DIAGNOSIS — Z23 Encounter for immunization: Secondary | ICD-10-CM

## 2016-11-01 DIAGNOSIS — R1310 Dysphagia, unspecified: Secondary | ICD-10-CM | POA: Diagnosis present

## 2016-11-01 DIAGNOSIS — Z800 Family history of malignant neoplasm of digestive organs: Secondary | ICD-10-CM

## 2016-11-01 DIAGNOSIS — Z993 Dependence on wheelchair: Secondary | ICD-10-CM

## 2016-11-01 DIAGNOSIS — Z905 Acquired absence of kidney: Secondary | ICD-10-CM

## 2016-11-01 DIAGNOSIS — Z7401 Bed confinement status: Secondary | ICD-10-CM

## 2016-11-01 DIAGNOSIS — Z79890 Hormone replacement therapy (postmenopausal): Secondary | ICD-10-CM

## 2016-11-01 LAB — CBC WITH DIFFERENTIAL/PLATELET
BASOS ABS: 0 10*3/uL (ref 0.0–0.1)
Basophils Relative: 2 %
Eosinophils Absolute: 0 10*3/uL (ref 0.0–0.7)
Eosinophils Relative: 0 %
HCT: 19.7 % — ABNORMAL LOW (ref 36.0–46.0)
HEMOGLOBIN: 6.7 g/dL — AB (ref 12.0–15.0)
LYMPHS ABS: 0.4 10*3/uL — AB (ref 0.7–4.0)
Lymphocytes Relative: 72 %
MCH: 33.5 pg (ref 26.0–34.0)
MCHC: 34 g/dL (ref 30.0–36.0)
MCV: 98.5 fL (ref 78.0–100.0)
MONO ABS: 0.1 10*3/uL (ref 0.1–1.0)
MONOS PCT: 17 %
NEUTROS PCT: 9 %
Neutro Abs: 0.1 10*3/uL — ABNORMAL LOW (ref 1.7–7.7)
Platelets: 53 10*3/uL — ABNORMAL LOW (ref 150–400)
RBC: 2 MIL/uL — AB (ref 3.87–5.11)
RDW: 17.5 % — AB (ref 11.5–15.5)
WBC: 0.6 10*3/uL — AB (ref 4.0–10.5)

## 2016-11-01 LAB — HEPATIC FUNCTION PANEL
ALK PHOS: 70 U/L (ref 38–126)
ALT: 21 U/L (ref 14–54)
AST: 39 U/L (ref 15–41)
Albumin: 2.7 g/dL — ABNORMAL LOW (ref 3.5–5.0)
BILIRUBIN INDIRECT: 0.7 mg/dL (ref 0.3–0.9)
Bilirubin, Direct: 0.1 mg/dL (ref 0.1–0.5)
TOTAL PROTEIN: 5.4 g/dL — AB (ref 6.5–8.1)
Total Bilirubin: 0.8 mg/dL (ref 0.3–1.2)

## 2016-11-01 LAB — BASIC METABOLIC PANEL
ANION GAP: 11 (ref 5–15)
Anion gap: 7 (ref 5–15)
BUN: 38 mg/dL — AB (ref 6–20)
BUN: 29 mg/dL — AB (ref 6–20)
CALCIUM: 8.9 mg/dL (ref 8.9–10.3)
CO2: 23 mmol/L (ref 22–32)
CO2: 22 mmol/L (ref 22–32)
CREATININE: 1.1 mg/dL — AB (ref 0.44–1.00)
Calcium: 9.5 mg/dL (ref 8.9–10.3)
Chloride: 90 mmol/L — ABNORMAL LOW (ref 101–111)
Chloride: 99 mmol/L — ABNORMAL LOW (ref 101–111)
Creatinine, Ser: 1.17 mg/dL — ABNORMAL HIGH (ref 0.44–1.00)
GFR calc Af Amer: 47 mL/min — ABNORMAL LOW (ref >60)
GFR calc Af Amer: 50 mL/min — ABNORMAL LOW (ref >60)
GFR calc non Af Amer: 44 mL/min — ABNORMAL LOW (ref >60)
GFR, EST NON AFRICAN AMERICAN: 40 mL/min — AB (ref >60)
GLUCOSE: 99 mg/dL (ref 65–99)
Glucose, Bld: 97 mg/dL (ref 65–99)
POTASSIUM: 4.5 mmol/L (ref 3.5–5.1)
Potassium: 4.2 mmol/L (ref 3.5–5.1)
SODIUM: 124 mmol/L — AB (ref 135–145)
SODIUM: 128 mmol/L — AB (ref 135–145)

## 2016-11-01 LAB — URINALYSIS, ROUTINE W REFLEX MICROSCOPIC
Bilirubin Urine: NEGATIVE
Glucose, UA: NEGATIVE mg/dL
HGB URINE DIPSTICK: NEGATIVE
Ketones, ur: NEGATIVE mg/dL
LEUKOCYTES UA: NEGATIVE
Nitrite: NEGATIVE
PROTEIN: NEGATIVE mg/dL
SPECIFIC GRAVITY, URINE: 1.015 (ref 1.005–1.030)
pH: 5 (ref 5.0–8.0)

## 2016-11-01 LAB — CBC
HEMATOCRIT: 27.6 % — AB (ref 36.0–46.0)
HEMOGLOBIN: 9.4 g/dL — AB (ref 12.0–15.0)
MCH: 33.1 pg (ref 26.0–34.0)
MCHC: 34.1 g/dL (ref 30.0–36.0)
MCV: 97.2 fL (ref 78.0–100.0)
Platelets: 56 10*3/uL — ABNORMAL LOW (ref 150–400)
RBC: 2.84 MIL/uL — AB (ref 3.87–5.11)
RDW: 17.7 % — ABNORMAL HIGH (ref 11.5–15.5)
WBC: 0.5 10*3/uL — AB (ref 4.0–10.5)

## 2016-11-01 MED ORDER — TIMOLOL HEMIHYDRATE 0.5 % OP SOLN
1.0000 [drp] | Freq: Two times a day (BID) | OPHTHALMIC | Status: DC
  Administered 2016-11-02 – 2016-11-06 (×8): 1 [drp] via OPHTHALMIC
  Filled 2016-11-01: qty 15

## 2016-11-01 MED ORDER — HYDRALAZINE HCL 20 MG/ML IJ SOLN: 10.0000 mg | INTRAMUSCULAR | Status: DC | PRN

## 2016-11-01 MED ORDER — NADOLOL 40 MG PO TABS
60.0000 mg | ORAL_TABLET | Freq: Every day | ORAL | Status: DC
  Administered 2016-11-02 – 2016-11-05 (×4): 60 mg via ORAL
  Filled 2016-11-01 (×6): qty 1

## 2016-11-01 MED ORDER — ACETAMINOPHEN 325 MG PO TABS
650.0000 mg | ORAL_TABLET | Freq: Four times a day (QID) | ORAL | Status: DC | PRN
  Administered 2016-11-02: 650 mg via ORAL
  Filled 2016-11-01 (×2): qty 2

## 2016-11-01 MED ORDER — PREDNISOLONE ACETATE 1 % OP SUSP
1.0000 [drp] | Freq: Every day | OPHTHALMIC | Status: DC
  Administered 2016-11-02 – 2016-11-06 (×5): 1 [drp] via OPHTHALMIC
  Filled 2016-11-01: qty 5

## 2016-11-01 MED ORDER — SODIUM CHLORIDE 0.9 % IV SOLN
INTRAVENOUS | Status: AC
  Administered 2016-11-02: 03:00:00 via INTRAVENOUS

## 2016-11-01 MED ORDER — VITAMIN B-12 1000 MCG PO TABS
1000.0000 ug | ORAL_TABLET | Freq: Every day | ORAL | Status: DC
  Administered 2016-11-02 – 2016-11-06 (×4): 1000 ug via ORAL
  Filled 2016-11-01 (×5): qty 1

## 2016-11-01 MED ORDER — SODIUM CHLORIDE 0.9% FLUSH
10.0000 mL | Freq: Two times a day (BID) | INTRAVENOUS | Status: DC
  Administered 2016-11-05: 10 mL

## 2016-11-01 MED ORDER — ONDANSETRON HCL 4 MG/2ML IJ SOLN: 4.0000 mg | Freq: Four times a day (QID) | INTRAMUSCULAR | Status: DC | PRN

## 2016-11-01 MED ORDER — SODIUM CHLORIDE 0.9 % IV BOLUS (SEPSIS)
1000.0000 mL | Freq: Once | INTRAVENOUS | Status: AC
  Administered 2016-11-01: 1000 mL via INTRAVENOUS

## 2016-11-01 MED ORDER — LEVOTHYROXINE SODIUM 50 MCG PO TABS
50.0000 ug | ORAL_TABLET | Freq: Every day | ORAL | Status: DC
  Administered 2016-11-02 – 2016-11-06 (×5): 50 ug via ORAL
  Filled 2016-11-01 (×5): qty 1

## 2016-11-01 MED ORDER — ALTEPLASE 2 MG IJ SOLR
2.0000 mg | Freq: Once | INTRAMUSCULAR | Status: AC
  Administered 2016-11-01: 2 mg
  Filled 2016-11-01: qty 2

## 2016-11-01 MED ORDER — ONDANSETRON HCL 4 MG PO TABS: 4.0000 mg | ORAL_TABLET | Freq: Four times a day (QID) | ORAL | Status: DC | PRN

## 2016-11-01 MED ORDER — ADULT MULTIVITAMIN W/MINERALS CH
1.0000 | ORAL_TABLET | Freq: Every day | ORAL | Status: DC
  Administered 2016-11-04: 1 via ORAL
  Filled 2016-11-01 (×3): qty 1

## 2016-11-01 MED ORDER — SODIUM CHLORIDE 0.9 % IV SOLN
INTRAVENOUS | Status: DC
  Administered 2016-11-01: 14:00:00 via INTRAVENOUS

## 2016-11-01 MED ORDER — ACETAMINOPHEN 650 MG RE SUPP: 650.0000 mg | Freq: Four times a day (QID) | RECTAL | Status: DC | PRN

## 2016-11-01 MED ORDER — CHOLECALCIFEROL 10 MCG (400 UNIT) PO TABS
400.0000 [IU] | ORAL_TABLET | Freq: Every day | ORAL | Status: DC
  Administered 2016-11-02 – 2016-11-04 (×2): 400 [IU] via ORAL
  Filled 2016-11-01 (×5): qty 1

## 2016-11-01 MED ORDER — INFLUENZA VAC SPLIT HIGH-DOSE 0.5 ML IM SUSY
0.5000 mL | PREFILLED_SYRINGE | INTRAMUSCULAR | Status: AC
  Administered 2016-11-04: 0.5 mL via INTRAMUSCULAR
  Filled 2016-11-01 (×2): qty 0.5

## 2016-11-01 MED ORDER — SODIUM CHLORIDE 0.9 % IV SOLN: Freq: Once | INTRAVENOUS | Status: DC

## 2016-11-01 NOTE — ED Triage Notes (Signed)
Per family member, states is being treated for a chronic UTI-states poor PO intake for a few days-patient is on oral chemo for renal carcinoma-oncologist has scheduled MRI of brain for Monday to R/O metz-family concerned about dehydration etc

## 2016-11-01 NOTE — ED Notes (Signed)
Bed: WA16 Expected date:  Expected time:  Means of arrival:  Comments: EMS 

## 2016-11-01 NOTE — ED Notes (Signed)
Date and time results received: 11/01/16    (use smartphrase ".now" to insert current time)  Test: WBC Critical Value: 0.5k  Name of Provider Notified: Dr. [name]  Orders Received? Or Actions Taken?: Actions Taken: none needed 1:22 PM

## 2016-11-01 NOTE — H&P (Signed)
History and Physical    Melissa Cross HQI:[id number] DOB: 1925-01-16 DOA: 11/01/2016  PCP: Patient, No Pcp Per  Patient coming from: Home  Chief Complaint: Generalized weakness.  HPI: Melissa Cross is a 83 y.o. female with history of renal cell carcinoma and lymphoma, hypertension, hypothyroidism was brought to the ER after patient was found to have progressive weakness unable to ambulate.  As per the patient's son patient has been progressively getting weaker over the last 2 months and is wheelchair-bound and last few days unable to ambulate.  2 weeks ago patient had a fall and sustained a fracture of the right foot.  Patient also at times was found to be confused and patient's oncologist has ordered MRI of the brain on Monday, October 29 which is pending.  Since patient weakness has progressed patient was brought to the ER.  As per the family patient unable to eat in the last 3 days with nausea vomiting denies any diarrhea.  Denies any abdominal pain but has been having some dysuria.  Last 4 weeks patient has been treated for dysuria and UTI with 4 courses of antibiotics last of which was Cipro and had taken Bactrim for 10 days.  ED Course: In the ER patient was mildly hypotensive and was given fluid bolus sodium was 124 creatinine was 1.17 which was increased from 2 weeks ago 0.96.  Patient blood count also has been steadily decreasing and now is pancytopenic with temperature of 99 F.  On exam patient is generally weak and able to move all extremities but with difficulty.  CT of the head was unremarkable.  UA today did not show any signs of UTI.  Patient is being admitted for generalized weakness with failure to thrive dehydration pancytopenia.  Review of Systems: As per HPI, rest all negative.   Past Medical History:  Diagnosis Date  . Anemia   . Arthritis    knee /hip  . Blood transfusion   . Cancer ([place])    kidney cancer, NHL   . Cancer [facility])    last chemo 11/04/10   .  Cataract    right  . Glaucoma   . Headache(784.0)    hx of migraines  . History of bladder infections   . History of blood transfusion   . History of shingles   . Hypercholesteremia    takes Zocor daily  . Hypertension    takes Nadolol daily  . Hypothyroid   . Hypothyroidism    takes Synthroid daily  . Non Hodgkin's lymphoma ([place])   . Peripheral neuropathy   . PONV (postoperative nausea and vomiting)   . Renal cell carcinoma   . Scoliosis   . Shortness of breath    with exertion  . Stroke ([place])    hx of TIA  . Thrush 12/17/2010  . TIA (transient ischemic attack)     Past Surgical History:  Procedure Laterality Date  . left cataract removed    . MOUTH SURGERY    . NEPHRECTOMY     Right Side  . PORTACATH PLACEMENT     right subclavian  . TONSILLECTOMY     as a child  . VIDEO BRONCHOSCOPY WITH ENDOBRONCHIAL NAVIGATION N/A 08/06/2012   Procedure: VIDEO BRONCHOSCOPY WITH ENDOBRONCHIAL NAVIGATION;  Surgeon: Collene Gobble, MD;  Location: [place];  Service: Thoracic;  Laterality: N/A;  . VIDEO BRONCHOSCOPY WITH ENDOBRONCHIAL ULTRASOUND N/A 08/06/2012   Procedure: VIDEO BRONCHOSCOPY WITH ENDOBRONCHIAL ULTRASOUND;  Surgeon: Collene Gobble, MD;  Location: [facility] OR;  Service: Thoracic;  Laterality: N/A;     reports that she quit smoking about 24 years ago. She has a 12.50 pack-year smoking history. She has never used smokeless tobacco. She reports that she does not drink alcohol or use drugs.  Allergies  Allergen Reactions  . Crab [Shellfish Allergy] Nausea And Vomiting  . Dilaudid [Hydromorphone Hcl] Other (See Comments)    Hallucinations and nausea    Family History  Problem Relation Age of Onset  . Colon cancer Father   . Stroke Mother     Prior to Admission medications   Medication Sig Start Date End Date Taking? Authorizing Provider  amLODipine (NORVASC) 5 MG tablet Take 1 tablet (5 mg total) by mouth daily. 09/30/12  Yes Carlton Adam, PA-C  aspirin EC 81 MG tablet  Take 81 mg by mouth at bedtime.    Yes [provider]  Calcium Citrate (CITRACAL PO) Take 1 tablet by mouth 3 (three) times a week.    Yes [provider]  Calcium-Magnesium-Vitamin D (CITRACAL CALCIUM+D) 600-40-500 MG-MG-UNIT TB24 Take 630 mg by mouth.   Yes [provider]  cholecalciferol (VITAMIN D) 400 UNITS TABS Take 400 Units by mouth daily.     Yes [provider]  ciprofloxacin (CIPRO) 250 MG tablet Take 250 mg by mouth daily. 10/31/16  Yes [provider]  CRANBERRY PO Take 1 tablet by mouth daily.   Yes [provider]  gabapentin (NEURONTIN) 100 MG capsule Take 1 capsule in am, 1 in pm, and 2 at bedtime. 09/15/16  Yes Curt Bears, MD  levothyroxine (SYNTHROID, LEVOTHROID) 50 MCG tablet Take 50 mcg by mouth every morning.    Yes [provider]  Multiple Vitamin (MULITIVITAMIN WITH MINERALS) TABS Take 1 tablet by mouth daily.     Yes [provider]  nadolol (CORGARD) 40 MG tablet Take 60 mg by mouth at bedtime.    Yes [provider]  pazopanib (VOTRIENT) 200 MG tablet Take 2 tablets (400 mg total) by mouth daily. Take on an empty stomach. 01/04/16  Yes Curt Bears, MD  prednisoLONE acetate (PRED FORTE) 1 % ophthalmic suspension instill 1 drop into right eye once daily 02/04/16  Yes [provider]  spironolactone (ALDACTONE) 25 MG tablet take 1/2 tablet by mouth once daily with food 02/27/16  Yes [provider]  timolol (BETIMOL) 0.5 % ophthalmic solution Place 1 drop into the left eye 2 (two) times daily.   Yes [provider]  vitamin B-12 (CYANOCOBALAMIN) 1000 MCG tablet Place 1,000 mcg under the tongue daily.    Yes [provider]  sulfamethoxazole-trimethoprim (BACTRIM DS,SEPTRA DS) 800-160 MG tablet Take 1 tablet by mouth 2 (two) times daily. 10/25/16   [provider]  Karen Kays TO Henreitta Cea  Use with ambulation Patient not taking: Reported on  11/01/2016 08/16/14   Curt Bears, MD    Physical Exam: Vitals:   11/01/16 1201 11/01/16 1358 11/01/16 1600 11/01/16 1655  BP: (!) 90/54 123/60 134/72 (!) 130/59  Pulse: 75 74  78  Resp: 17 16 14 20   Temp: 98.2 F (36.8 C) 98.7 F (37.1 C)  99.4 F (37.4 C)  TempSrc: Oral Oral  Oral  SpO2: 100% 100%  98%      Constitutional: Moderately built and nourished. Vitals:   11/01/16 1201 11/01/16 1358 11/01/16 1600 11/01/16 1655  BP: (!) 90/54 123/60 134/72 (!) 130/59  Pulse: 75 74  78  Resp: 17 16 14  20  Temp: 98.2 F (36.8 C) 98.7 F (37.1 C)  99.4 F (37.4 C)  TempSrc: Oral Oral  Oral  SpO2: 100% 100%  98%   Eyes: Anicteric no pallor. ENMT: No discharge from the ears eyes nose or mouth. Neck: No mass felt.  No neck rigidity. Respiratory: No rhonchi or crepitations. Cardiovascular: S1-S2 heard no murmurs appreciated. Abdomen: Soft nontender bowel sounds present. Musculoskeletal: No edema right leg has a brace. Skin: No rash.  Chronic skin changes. Neurologic: Alert awake oriented to name and place and person moves all extremities but with difficulty due to weakness. Psychiatric: Appears normal but weak.   Labs on Admission: I have personally reviewed following labs and imaging studies  CBC:  Recent Labs Lab 11/01/16 1234  WBC 0.5*  HGB 9.4*  HCT 27.6*  MCV 97.2  PLT 56*   Basic Metabolic Panel:  Recent Labs Lab 11/01/16 1234  NA 124*  K 4.5  CL 90*  CO2 23  GLUCOSE 97  BUN 38*  CREATININE 1.17*  CALCIUM 9.5   GFR: CrCl cannot be calculated (Unknown ideal weight.). Liver Function Tests: No results for input(s): AST, ALT, ALKPHOS, BILITOT, PROT, ALBUMIN in the last 168 hours. No results for input(s): LIPASE, AMYLASE in the last 168 hours. No results for input(s): AMMONIA in the last 168 hours. Coagulation Profile: No results for input(s): INR, PROTIME in the last 168 hours. Cardiac Enzymes: No results for input(s): CKTOTAL, CKMB, CKMBINDEX,  TROPONINI in the last 168 hours. BNP (last 3 results) No results for input(s): PROBNP in the last 8760 hours. HbA1C: No results for input(s): HGBA1C in the last 72 hours. CBG: No results for input(s): GLUCAP in the last 168 hours. Lipid Profile: No results for input(s): CHOL, HDL, LDLCALC, TRIG, CHOLHDL, LDLDIRECT in the last 72 hours. Thyroid Function Tests: No results for input(s): TSH, T4TOTAL, FREET4, T3FREE, THYROIDAB in the last 72 hours. Anemia Panel: No results for input(s): VITAMINB12, FOLATE, FERRITIN, TIBC, IRON, RETICCTPCT in the last 72 hours. Urine analysis:    Component Value Date/Time   COLORURINE YELLOW 11/01/2016 1250   APPEARANCEUR CLEAR 11/01/2016 1250   LABSPEC 1.015 11/01/2016 1250   LABSPEC 1.005 11/01/2010 1054   PHURINE 5.0 11/01/2016 1250   GLUCOSEU NEGATIVE 11/01/2016 1250   HGBUR NEGATIVE 11/01/2016 1250   BILIRUBINUR NEGATIVE 11/01/2016 1250   BILIRUBINUR Negative 11/01/2010 1054   KETONESUR NEGATIVE 11/01/2016 1250   PROTEINUR NEGATIVE 11/01/2016 1250   UROBILINOGEN 2.0 (H) 06/09/2013 2328   NITRITE NEGATIVE 11/01/2016 1250   LEUKOCYTESUR NEGATIVE 11/01/2016 1250   LEUKOCYTESUR Large 11/01/2010 1054   Sepsis Labs: @LABRCNTIP (procalcitonin:4,lacticidven:4) )No results found for this or any previous visit (from the past 240 hour(s)).   Radiological Exams on Admission: Ct Head Wo Contrast  Result Date: 11/01/2016 CLINICAL DATA:  83 year old with altered level of consciousness. EXAM: CT HEAD WITHOUT CONTRAST TECHNIQUE: Contiguous axial images were obtained from the base of the skull through the vertex without intravenous contrast. COMPARISON:  None. FINDINGS: Brain: Cerebral atrophy with diffuse low-density in the periventricular white matter. No evidence for acute hemorrhage, mass lesion, midline shift, hydrocephalus or large infarct. Vascular: No hyperdense vessel or unexpected calcification. Skull: Normal. Negative for fracture or focal lesion.  Sinuses/Orbits: Mild mucosal disease in the posterior right ethmoid air cells. Other: None. IMPRESSION: No acute intracranial abnormality. Atrophy and evidence for chronic small vessel ischemic changes. Electronically Signed   By: Markus Daft M.D.   On: 11/01/2016 14:44     Assessment/Plan Principal  Problem:   Weakness generalized Active Problems:   Renal cell carcinoma of right kidney (HCC)   Hyponatremia   Essential hypertension   Dehydration   Pancytopenia (HCC)   Hypothyroidism   Weakness    1. Generalized weakness with failure to thrive nausea and vomiting - not sure if caused by Votrient, which we will hold off for now until oncology consult.  Discussed with Dr. Jana Hakim on-call oncologist who will be seeing patient in consult.  Patient looks dehydrated from poor oral intake.  We will hold off patient's spironolactone and at this time gently hydrate and follow metabolic panel closely.  We will get physical therapy consult.  Patient is due to get an MRI of the brain 2 days from now but would probably get it earlier once patient hydrated with contrast.  Check TSH.  Abdomen appears benign but would scan if patient has persistent vomiting. 2. Pancytopenia - could either be due to recent use of Bactrim or due to Votrient or related to malignanacy.  Discussed with Dr. Harriet Pho. Holding Votrient.   Since patient is mildly febrile and has symptoms of dysuria we will keep patient on cefepime for now follow blood cultures urine cultures and follow CBC with differential count which is pending.  Oncology consulted. 3. Hypertension - was mildly hypotensive when patient came improved with fluids.  We will hold off spironolactone and amlodipine and keep patient on as needed IV hydralazine.  We will continue with Corgard with holding parameters. 4. Hyponatremia and acute renal failure probably from poor oral intake - continue with hydration hold spironolactone.  Follow metabolic panel.  Follow  urine studies.  Check TSH. 5. Hypothyroidism on Synthroid check TSH. 6. Renal cell carcinoma - Votrient on hold due to pancytopenia and nausea vomiting.  Dr. Jana Hakim on-call oncologist to see. 7. History of non-Hodgkin's lymphoma in remission per oncology.   DVT prophylaxis: SCDs due to thrombocytopenia. Code Status: DNR. Family Communication: Patient's son. Disposition Plan: To be determined. Consults called: Oncology. Admission status: Observation.   Rise Patience MD [facility] Pager [phone].  If 7PM-7AM, please contact night-coverage [email or web address] Password [email or web address]  11/01/2016, 5:28 PM

## 2016-11-01 NOTE — ED Provider Notes (Signed)
[place] DEPT Provider Note   CSN: [id number] Arrival date & time: 11/01/16  1138     History   Chief Complaint Chief Complaint  Patient presents with  . Urinary Tract Infection  . Failure To Thrive    HPI Melissa Cross is a 83 y.o. female.  83 year old female presents with diffuse weakness times several days and concern for unresolved urinary tract infection.  Has been treated with fosfomycin, Bactrim and now ciprofloxacin.  Continues to note dysuria without fever or flank pain.  Has had some nonbilious emesis several days ago but none currently.  Does not have any appetite.  Does have a history of renal cell cancer and is on chronic oral chemotherapy drug denies any new dyspnea or cough or congestion.  Weakness is diffuse and worse with any kind of activity.  No treatment use prior to arrival      Past Medical History:  Diagnosis Date  . Anemia   . Arthritis    knee /hip  . Blood transfusion   . Cancer ([place])    kidney cancer, NHL   . Cancer [facility])    last chemo 11/04/10   . Cataract    right  . Glaucoma   . Headache(784.0)    hx of migraines  . History of bladder infections   . History of blood transfusion   . History of shingles   . Hypercholesteremia    takes Zocor daily  . Hypertension    takes Nadolol daily  . Hypothyroid   . Hypothyroidism    takes Synthroid daily  . Non Hodgkin's lymphoma ([place])   . Peripheral neuropathy   . PONV (postoperative nausea and vomiting)   . Renal cell carcinoma   . Scoliosis   . Shortness of breath    with exertion  . Stroke ([place])    hx of TIA  . Thrush 12/17/2010  . TIA (transient ischemic attack)     Patient Active Problem List   Diagnosis Date Noted  . Neuropathy 05/19/2016  . Port catheter in place 08/23/2015  . Encounter for antineoplastic chemotherapy 06/28/2014  . Hyponatremia 06/10/2013  . Hypokalemia 06/10/2013  . UTI (lower urinary tract infection) 06/10/2013  .  Essential hypertension 06/10/2013  . Malnutrition of moderate degree ([place]) 06/10/2013  . Pulmonary nodule 08/06/2012  . Renal cell carcinoma of right kidney ([place]) 09/30/2011  . Thrush 12/17/2010  . Lymphoma, large-cell, diffuse ([place]) 11/13/2010    Past Surgical History:  Procedure Laterality Date  . left cataract removed    . MOUTH SURGERY    . NEPHRECTOMY     Right Side  . PORTACATH PLACEMENT     right subclavian  . TONSILLECTOMY     as a child  . VIDEO BRONCHOSCOPY WITH ENDOBRONCHIAL NAVIGATION N/A 08/06/2012   Procedure: VIDEO BRONCHOSCOPY WITH ENDOBRONCHIAL NAVIGATION;  Surgeon: Collene Gobble, MD;  Location: [place];  Service: Thoracic;  Laterality: N/A;  . VIDEO BRONCHOSCOPY WITH ENDOBRONCHIAL ULTRASOUND N/A 08/06/2012   Procedure: VIDEO BRONCHOSCOPY WITH ENDOBRONCHIAL ULTRASOUND;  Surgeon: Collene Gobble, MD;  Location: [place];  Service: Thoracic;  Laterality: N/A;    OB History    No data available       Home Medications    Prior to Admission medications   Medication Sig Start Date End Date Taking? Authorizing Provider  amLODipine (NORVASC) 5 MG tablet Take 1 tablet (5 mg total) by mouth daily. 09/30/12   Carlton Adam, PA-C  aspirin EC  81 MG tablet Take 81 mg by mouth at bedtime.     [provider]  Calcium Citrate (CITRACAL PO) Take 1 tablet by mouth 3 (three) times a week.     [provider]  Calcium-Magnesium-Vitamin D (CITRACAL CALCIUM+D) 600-40-500 MG-MG-UNIT TB24 Take 630 mg by mouth.    [provider]  cholecalciferol (VITAMIN D) 400 UNITS TABS Take 400 Units by mouth daily.      [provider]  gabapentin (NEURONTIN) 100 MG capsule Take 1 capsule in am, 1 in pm, and 2 at bedtime. 09/15/16   Curt Bears, MD  levothyroxine (SYNTHROID, LEVOTHROID) 50 MCG tablet Take 50 mcg by mouth every morning.     [provider]  Multiple Vitamin (MULITIVITAMIN WITH MINERALS) TABS Take 1 tablet by mouth daily.      [provider]  nadolol (CORGARD) 40 MG tablet Take 40 mg by mouth at bedtime.     [provider]  pazopanib (VOTRIENT) 200 MG tablet Take 2 tablets (400 mg total) by mouth daily. Take on an empty stomach. 01/04/16   Curt Bears, MD  prednisoLONE acetate (PRED FORTE) 1 % ophthalmic suspension instill 1 drop into right eye once daily 02/04/16   [provider]  spironolactone (ALDACTONE) 25 MG tablet take 1/2 tablet by mouth once daily with food 02/27/16   [provider]  timolol (BETIMOL) 0.5 % ophthalmic solution Place 1 drop into the left eye 2 (two) times daily.    [provider]  Karen Kays TO FIND Walker  Use with ambulation 08/16/14   Curt Bears, MD  vitamin B-12 (CYANOCOBALAMIN) 1000 MCG tablet Place 1,000 mcg under the tongue daily.     [provider]    Family History Family History  Problem Relation Age of Onset  . Colon cancer Father   . Stroke Mother     Social History Social History  Substance Use Topics  . Smoking status: Former Smoker    Packs/day: 0.50    Years: 25.00    Quit date: 01/07/1992  . Smokeless tobacco: Never Used     Comment: quit many yrs ago  . Alcohol use No     Allergies   Crab [shellfish allergy] and Dilaudid [hydromorphone hcl]   Review of Systems Review of Systems  All other systems reviewed and are negative.    Physical Exam Updated Vital Signs BP (!) 90/54 (BP Location: Left Arm)   Pulse 75   Temp 98.2 F (36.8 C) (Oral)   Resp 17   SpO2 100%   Physical Exam  Constitutional: She is oriented to person, place, and time. She appears well-developed and well-nourished.  Non-toxic appearance. No distress.  HENT:  Head: Normocephalic and atraumatic.  Eyes: Pupils are equal, round, and reactive to light. Conjunctivae, EOM and lids are normal.  Neck: Normal range of motion. Neck supple. No tracheal deviation present. No thyroid mass present.  Cardiovascular: Normal rate, regular  rhythm and normal heart sounds.  Exam reveals no gallop.   No murmur heard. Pulmonary/Chest: Effort normal and breath sounds normal. No stridor. No respiratory distress. She has no decreased breath sounds. She has no wheezes. She has no rhonchi. She has no rales.  Abdominal: Soft. Normal appearance and bowel sounds are normal. She exhibits no distension. There is no tenderness. There is no rebound and no CVA tenderness.  Musculoskeletal: Normal range of motion. She exhibits no edema or tenderness.  Neurological: She is alert and oriented to person, place,  and time. She has normal strength. No cranial nerve deficit or sensory deficit. GCS eye subscore is 4. GCS verbal subscore is 5. GCS motor subscore is 6.  Skin: Skin is warm and dry. No abrasion and no rash noted.  Psychiatric: She has a normal mood and affect. Her speech is normal and behavior is normal.  Nursing note and vitals reviewed.    ED Treatments / Results  Labs (all labs ordered are listed, but only abnormal results are displayed) Labs Reviewed  URINALYSIS, [place] PANEL  CBC    EKG  EKG Interpretation None       Radiology No results found.  Procedures Procedures (including critical care time)  Medications Ordered in ED Medications  0.9 %  sodium chloride infusion (not administered)  sodium chloride 0.9 % bolus 1,000 mL (1,000 mLs Intravenous New Bag/Given 11/01/16 1240)     Initial Impression / Assessment and Plan / ED Course  I have reviewed the triage vital signs and the nursing notes.  Pertinent labs & imaging results that were available during my care of the patient were reviewed by me and considered in my medical decision making (see chart for details).     Patient given IV fluids here and has evidence of hyponatremia.  Was hypotensive initially not responded well to fluids.  She is also neutropenic as well.  Will order blood cultures as well as lactate.  Spoke  with Dr. Hal Hope who will admit the patient  Final Clinical Impressions(s) / ED Diagnoses   Final diagnoses:  None    New Prescriptions New Prescriptions   No medications on file     Lacretia Leigh, MD 11/01/16 1514

## 2016-11-01 NOTE — Progress Notes (Signed)
CRITICAL VALUE ALERT  Critical Value:  Hgb 6.7  Date & Time Notied:  10/27 2320  Provider Notified: Jeannette Corpus  Orders Received/Actions 1 unit blood ordered

## 2016-11-02 DIAGNOSIS — R5383 Other fatigue: Secondary | ICD-10-CM | POA: Diagnosis not present

## 2016-11-02 DIAGNOSIS — E871 Hypo-osmolality and hyponatremia: Secondary | ICD-10-CM | POA: Diagnosis not present

## 2016-11-02 DIAGNOSIS — R1310 Dysphagia, unspecified: Secondary | ICD-10-CM | POA: Diagnosis present

## 2016-11-02 DIAGNOSIS — Z515 Encounter for palliative care: Secondary | ICD-10-CM | POA: Diagnosis not present

## 2016-11-02 DIAGNOSIS — E46 Unspecified protein-calorie malnutrition: Secondary | ICD-10-CM | POA: Diagnosis not present

## 2016-11-02 DIAGNOSIS — Z66 Do not resuscitate: Secondary | ICD-10-CM | POA: Diagnosis present

## 2016-11-02 DIAGNOSIS — R531 Weakness: Secondary | ICD-10-CM | POA: Diagnosis not present

## 2016-11-02 DIAGNOSIS — T50904A Poisoning by unspecified drugs, medicaments and biological substances, undetermined, initial encounter: Secondary | ICD-10-CM | POA: Diagnosis not present

## 2016-11-02 DIAGNOSIS — I10 Essential (primary) hypertension: Secondary | ICD-10-CM | POA: Diagnosis not present

## 2016-11-02 DIAGNOSIS — R5381 Other malaise: Secondary | ICD-10-CM | POA: Diagnosis not present

## 2016-11-02 DIAGNOSIS — D61818 Other pancytopenia: Secondary | ICD-10-CM | POA: Diagnosis not present

## 2016-11-02 DIAGNOSIS — R5081 Fever presenting with conditions classified elsewhere: Secondary | ICD-10-CM | POA: Diagnosis present

## 2016-11-02 DIAGNOSIS — T451X5A Adverse effect of antineoplastic and immunosuppressive drugs, initial encounter: Secondary | ICD-10-CM | POA: Diagnosis present

## 2016-11-02 DIAGNOSIS — D6481 Anemia due to antineoplastic chemotherapy: Secondary | ICD-10-CM | POA: Diagnosis present

## 2016-11-02 DIAGNOSIS — Z8572 Personal history of non-Hodgkin lymphomas: Secondary | ICD-10-CM | POA: Diagnosis not present

## 2016-11-02 DIAGNOSIS — I679 Cerebrovascular disease, unspecified: Secondary | ICD-10-CM | POA: Diagnosis not present

## 2016-11-02 DIAGNOSIS — D702 Other drug-induced agranulocytosis: Secondary | ICD-10-CM | POA: Diagnosis not present

## 2016-11-02 DIAGNOSIS — C649 Malignant neoplasm of unspecified kidney, except renal pelvis: Secondary | ICD-10-CM | POA: Diagnosis not present

## 2016-11-02 DIAGNOSIS — Z0000 Encounter for general adult medical examination without abnormal findings: Secondary | ICD-10-CM | POA: Diagnosis not present

## 2016-11-02 DIAGNOSIS — N179 Acute kidney failure, unspecified: Secondary | ICD-10-CM | POA: Diagnosis present

## 2016-11-02 DIAGNOSIS — C801 Malignant (primary) neoplasm, unspecified: Secondary | ICD-10-CM | POA: Diagnosis not present

## 2016-11-02 DIAGNOSIS — D649 Anemia, unspecified: Secondary | ICD-10-CM | POA: Diagnosis not present

## 2016-11-02 DIAGNOSIS — N289 Disorder of kidney and ureter, unspecified: Secondary | ICD-10-CM | POA: Diagnosis not present

## 2016-11-02 DIAGNOSIS — G629 Polyneuropathy, unspecified: Secondary | ICD-10-CM | POA: Diagnosis present

## 2016-11-02 DIAGNOSIS — E860 Dehydration: Secondary | ICD-10-CM | POA: Diagnosis not present

## 2016-11-02 DIAGNOSIS — C641 Malignant neoplasm of right kidney, except renal pelvis: Secondary | ICD-10-CM | POA: Diagnosis not present

## 2016-11-02 DIAGNOSIS — C8590 Non-Hodgkin lymphoma, unspecified, unspecified site: Secondary | ICD-10-CM | POA: Diagnosis present

## 2016-11-02 DIAGNOSIS — N390 Urinary tract infection, site not specified: Secondary | ICD-10-CM | POA: Diagnosis present

## 2016-11-02 DIAGNOSIS — E43 Unspecified severe protein-calorie malnutrition: Secondary | ICD-10-CM | POA: Diagnosis present

## 2016-11-02 DIAGNOSIS — Z7401 Bed confinement status: Secondary | ICD-10-CM | POA: Diagnosis not present

## 2016-11-02 DIAGNOSIS — I959 Hypotension, unspecified: Secondary | ICD-10-CM | POA: Diagnosis present

## 2016-11-02 DIAGNOSIS — E039 Hypothyroidism, unspecified: Secondary | ICD-10-CM | POA: Diagnosis present

## 2016-11-02 DIAGNOSIS — R4182 Altered mental status, unspecified: Secondary | ICD-10-CM | POA: Diagnosis not present

## 2016-11-02 DIAGNOSIS — Z6826 Body mass index (BMI) 26.0-26.9, adult: Secondary | ICD-10-CM | POA: Diagnosis not present

## 2016-11-02 DIAGNOSIS — Z23 Encounter for immunization: Secondary | ICD-10-CM | POA: Diagnosis not present

## 2016-11-02 DIAGNOSIS — R627 Adult failure to thrive: Secondary | ICD-10-CM | POA: Diagnosis not present

## 2016-11-02 DIAGNOSIS — Z993 Dependence on wheelchair: Secondary | ICD-10-CM | POA: Diagnosis not present

## 2016-11-02 LAB — CBC WITH DIFFERENTIAL/PLATELET
BASOS ABS: 0 10*3/uL (ref 0.0–0.1)
Basophils Relative: 4 %
EOS ABS: 0 10*3/uL (ref 0.0–0.7)
Eosinophils Relative: 0 %
HEMATOCRIT: 18.7 % — AB (ref 36.0–46.0)
Hemoglobin: 6.2 g/dL — CL (ref 12.0–15.0)
LYMPHS ABS: 0.3 10*3/uL — AB (ref 0.7–4.0)
Lymphocytes Relative: 69 %
MCH: 32.3 pg (ref 26.0–34.0)
MCHC: 33.2 g/dL (ref 30.0–36.0)
MCV: 97.4 fL (ref 78.0–100.0)
MONO ABS: 0.1 10*3/uL (ref 0.1–1.0)
Monocytes Relative: 11 %
NEUTROS ABS: 0.1 10*3/uL — AB (ref 1.7–7.7)
Neutrophils Relative %: 16 %
Platelets: 55 10*3/uL — ABNORMAL LOW (ref 150–400)
RBC: 1.92 MIL/uL — ABNORMAL LOW (ref 3.87–5.11)
RDW: 17.6 % — AB (ref 11.5–15.5)
WBC: 0.5 10*3/uL — CL (ref 4.0–10.5)

## 2016-11-02 LAB — BASIC METABOLIC PANEL
Anion gap: 5 (ref 5–15)
BUN: 26 mg/dL — AB (ref 6–20)
CALCIUM: 8.5 mg/dL — AB (ref 8.9–10.3)
CO2: 22 mmol/L (ref 22–32)
CREATININE: 0.82 mg/dL (ref 0.44–1.00)
Chloride: 104 mmol/L (ref 101–111)
GFR calc Af Amer: >60 mL/min (ref >60)
GLUCOSE: 96 mg/dL (ref 65–99)
Potassium: 3.8 mmol/L (ref 3.5–5.1)
Sodium: 131 mmol/L — ABNORMAL LOW (ref 135–145)

## 2016-11-02 LAB — RETICULOCYTES
RBC.: 1.92 MIL/uL — ABNORMAL LOW (ref 3.87–5.11)
RETIC COUNT ABSOLUTE: 19.2 10*3/uL (ref 19.0–186.0)
Retic Ct Pct: 1 % (ref 0.4–3.1)

## 2016-11-02 LAB — LACTATE DEHYDROGENASE: LDH: 237 U/L — ABNORMAL HIGH (ref 98–192)

## 2016-11-02 LAB — PREPARE RBC (CROSSMATCH)

## 2016-11-02 LAB — VITAMIN B12: Vitamin B-12: 1364 pg/mL — ABNORMAL HIGH (ref 180–914)

## 2016-11-02 LAB — FOLATE: FOLATE: 29 ng/mL (ref >5.9)

## 2016-11-02 LAB — SAVE SMEAR

## 2016-11-02 LAB — FERRITIN: FERRITIN: 3247 ng/mL — AB (ref 11–307)

## 2016-11-02 LAB — SODIUM, URINE, RANDOM: SODIUM UR: 13 mmol/L

## 2016-11-02 LAB — TSH: TSH: 2.011 u[IU]/mL (ref 0.350–4.500)

## 2016-11-02 MED ORDER — BOOST / RESOURCE BREEZE PO LIQD
1.0000 | Freq: Three times a day (TID) | ORAL | Status: DC
  Administered 2016-11-02 (×2): 1 via ORAL
  Administered 2016-11-04: 237 mL via ORAL
  Administered 2016-11-05 – 2016-11-06 (×3): 1 via ORAL

## 2016-11-02 MED ORDER — SODIUM CHLORIDE 0.9 % IV SOLN
Freq: Once | INTRAVENOUS | Status: AC
  Administered 2016-11-04: 09:00:00 via INTRAVENOUS

## 2016-11-02 NOTE — Evaluation (Signed)
Clinical/Bedside Swallow Evaluation Patient Details  Name: Melissa Cross MRN: [id number] Date of Birth: [birth year]  Today's Date: 11/02/2016 Time: SLP Start Time (ACUTE ONLY): 38 SLP Stop Time (ACUTE ONLY): 1138 SLP Time Calculation (min) (ACUTE ONLY): 35 min  Past Medical History:  Past Medical History:  Diagnosis Date  . Anemia   . Arthritis    knee /hip  . Blood transfusion   . Cancer ([place])    kidney cancer, NHL   . Cancer [facility])    last chemo 11/04/10   . Cataract    right  . Glaucoma   . Headache(784.0)    hx of migraines  . History of bladder infections   . History of blood transfusion   . History of shingles   . Hypercholesteremia    takes Zocor daily  . Hypertension    takes Nadolol daily  . Hypothyroid   . Hypothyroidism    takes Synthroid daily  . Non Hodgkin's lymphoma ([place])   . Peripheral neuropathy   . PONV (postoperative nausea and vomiting)   . Renal cell carcinoma   . Scoliosis   . Shortness of breath    with exertion  . Stroke ([place])    hx of TIA  . Thrush 12/17/2010  . TIA (transient ischemic attack)    Past Surgical History:  Past Surgical History:  Procedure Laterality Date  . left cataract removed    . MOUTH SURGERY    . NEPHRECTOMY     Right Side  . PORTACATH PLACEMENT     right subclavian  . TONSILLECTOMY     as a child  . VIDEO BRONCHOSCOPY WITH ENDOBRONCHIAL NAVIGATION N/A 08/06/2012   Procedure: VIDEO BRONCHOSCOPY WITH ENDOBRONCHIAL NAVIGATION;  Surgeon: Collene Gobble, MD;  Location: [place];  Service: Thoracic;  Laterality: N/A;  . VIDEO BRONCHOSCOPY WITH ENDOBRONCHIAL ULTRASOUND N/A 08/06/2012   Procedure: VIDEO BRONCHOSCOPY WITH ENDOBRONCHIAL ULTRASOUND;  Surgeon: Collene Gobble, MD;  Location: [place];  Service: Thoracic;  Laterality: N/A;   HPI:  Melissa Cross a 83 y.o.femalewith history of renal cellcarcinoma and lymphoma, hypertension, hypothyroidism was brought to the ER after patient was found to have progressive  weakness unable to ambulate. As per the patient's son patient has been progressively getting weaker over the last 2 months and is wheelchair-bound and last few days unable to ambulate. 2 weeks ago patient had a fall and sustained a fracture of the right foot. Patient also at times was found to be confused and patient's oncologist has ordered MRI of the brain on Monday, October 29 which is pending. Since patient weakness has progressed patient was brought to the ER. As per the family patient unable to eat in the last 3 days with nausea/vomiting denies any diarrhea. Denies any abdominal pain but has been having some dysuria..  Last 4 weeks patient has been treated for dysuria and UTI with 4 courses of antibiotics last of which was Cipro and had taken Bactrim for 10 days.  CT head is negative for acute findings.  MRI is pending.  There is no recent chest imaging.     Assessment / Plan / Recommendation Clinical Impression  Clinical swallowing evaluation was completed using thin liquids via straw sips and pureed material in setting for admission for progressive weakness.  Oral mechanism exam was completed and remarkable for generalized weakness.  The patient and daughter reported about a 3 week history of trouble swallowing described as things not wanting to go down and regurgitation  of food.  Positioning for assessment was less then optimal due to pain in the patient's right leg and was completed in a relatively reclined position.  The patient appeared to have an oral dysphagia characterized by VERY delayed oral transit given pureed material.  It was difficult to tell the exact cause of the the delayed oral transit.  Swallow trigger appeared to be timely and hyo-laryngeal excursion was appreciated to palpation.  No overt s/s of aspiration were seen.  Suspect an esophageal component given the patient's complaints.  Recommend continue with an all liquid diet using supplements as needed to boost calorie intake.   ST will continue to follow for therapeutic diet tolerance vs need for possible instrumental exam.   SLP Visit Diagnosis: Dysphagia, oral phase (R13.11)    Aspiration Risk  Mild aspiration risk    Diet Recommendation   All liquid diet.    Medication Administration: Whole meds with liquid    Other  Recommendations Recommended Consults: Consider esophageal assessment Oral Care Recommendations: Oral care BID   Follow up Recommendations Other (comment) (TBD)      Frequency and Duration min 2x/week  2 weeks       Prognosis Prognosis for Safe Diet Advancement: Fair      Swallow Study   General Date of Onset: 11/01/16 HPI: Melissa Cross a 83 y.o.femalewith history of renal cellcarcinoma and lymphoma, hypertension, hypothyroidism was brought to the ER after patient was found to have progressive weakness unable to ambulate. As per the patient's son patient has been progressively getting weaker over the last 2 months and is wheelchair-bound and last few days unable to ambulate. 2 weeks ago patient had a fall and sustained a fracture of the right foot. Patient also at times was found to be confused and patient's oncologist has ordered MRI of the brain on Monday, October 29 which is pending. Since patient weakness has progressed patient was brought to the ER. As per the family patient unable to eat in the last 3 days with nausea/vomiting denies any diarrhea. Denies any abdominal pain but has been having some dysuria..  Last 4 weeks patient has been treated for dysuria and UTI with 4 courses of antibiotics last of which was Cipro and had taken Bactrim for 10 days.  CT head is negative for acute findings.  MRI is pending.  There is no recent chest imaging.   Type of Study: Bedside Swallow Evaluation Previous Swallow Assessment: None noted at [facility]. Diet Prior to this Study: Dysphagia 3 (soft);Thin liquids Temperature Spikes Noted: Yes History of Recent Intubation: No Behavior/Cognition:  Alert Oral Cavity Assessment: Within Functional Limits Oral Care Completed by SLP: No Oral Cavity - Dentition: Missing dentition Self-Feeding Abilities: Total assist Patient Positioning: Partially reclined Baseline Vocal Quality: Low vocal intensity Volitional Cough: Weak Volitional Swallow: Able to elicit    Oral/Motor/Sensory Function Overall Oral Motor/Sensory Function: Mild impairment Facial ROM: Within Functional Limits Facial Symmetry: Within Functional Limits Facial Strength: Reduced right Lingual ROM: Within Functional Limits Lingual Symmetry: Within Functional Limits Lingual Strength: Reduced Mandible: Within Functional Limits   Ice Chips Ice chips: Not tested   Thin Liquid Thin Liquid: Within functional limits Presentation: Straw    Nectar Thick Nectar Thick Liquid: Not tested   Honey Thick Honey Thick Liquid: Not tested   Puree Puree: Impaired Presentation: Spoon Oral Phase Impairments: Impaired mastication Oral Phase Functional Implications: Prolonged oral transit   Solid   GO   Solid: Not tested  Shelly Flatten, MA, [facility] SLP [phone] Lamar Sprinkles 11/02/2016,11:41 AM

## 2016-11-02 NOTE — Progress Notes (Signed)
Pts blood has antibody present and blood will have to be ordered and wont arrive until day shift today.

## 2016-11-02 NOTE — Consult Note (Signed)
[facility]  Telephone:([phone]) [phone] Fax:(336) [phone]     ID: Melissa Cross DOB: [birth year]  MR#: [id number]  QDU#:[id number]  Patient Care Team: Patient, No Pcp Per as PCP - General (General Practice) PCP: Patient, No Pcp Per Chauncey Cruel, MD GYN: SU:  OTHER MD:  CHIEF COMPLAINT: pancytopenia  CURRENT TREATMENT: evaluation in progress  RESEARCH PROTOCOL:NO  HISTORY OF PRESENT ILLNESS. Melissa Cross is an 83 y/o [place] woman with a history of non-Hodgkin's lymphoma, dating back to 2012, when she was treated with standard chemotherapy (CHOP/rituximab).  She is under the care of Dr. Earlie Server for this.  She has been in remission since.  In addition, she underwent right nephrectomy in August 2012 for a renal cell carcinoma, metastatic to the lungs.  Surgery was performed by Dr. Diona Fanti. She has been on pazopanib maintenance for this, with very good results, although her doses have been decreased over the past few months because of fatigue.  Over the past several weeks, the patient's overall functional status has declined.  The patient's daughter tells me the patient has fallen twice in the last 5 weeks, fracturing her right big toe.  She has been more confused.  She complains of pain "everywhere".  Part of this is due to neuropathy from her earlier treatments, apart from arthritis, and part of course from her foot.  According to the daughter the patient has pretty much stopped eating and drinking.  She has been complaining of urinary symptoms, was treated with Septra for this, recently was changed to Cipro by her primary care physician.  On November 01, 2016 the patient was unable to ambulate, and given the rapid decline just documented the family called the primary care physician who suggested an ED evaluation.  The patient was found to be hypotensive, hyponatremic, and pancytopenic, with a temperature of 99.0, the total white cell count being 0.5, hemoglobin 9.4, and  platelets 56,000.  One month ago the white cell count was 4.1, hemoglobin 12.4, and platelets 210,000.  The patient was admitted for further evaluation and treatment  INTERVAL HISTORY: I met with the patient and her daughter Melissa Cross in the patient's room November 02, 2016  REVIEW OF SYSTEMS: The patient tells me she hurts "everywhere".  She admits to feeling very tired.  Even though she has had 2 rounds of antibiotics and her admission urinalysis was clear, she still complains of bladder symptoms.  She denies hematuria.  She does not report a fever.  She tells me she has no appetite.  She denies taste alteration or nausea.  She tells me she does take her medicine as prescribed and is not aware of missing any doses.  She was set up for a brain MRI scheduled for tomorrow and wonders if that is still necessary.  She denies unusual headaches, visual changes, but admits to gait imbalance.  She denies cough, phlegm production, or pleurisy, diarrhea, or constipation.  Not aware of significant shortness of breath, but put a been in bed all this time".  A detailed review of systems today was otherwise noncontributory  Allergies  Allergen Reactions  . Crab [Shellfish Allergy] Nausea And Vomiting  . Dilaudid [Hydromorphone Hcl] Other (See Comments)    Hallucinations and nausea    Current Facility-Administered Medications  Medication Dose Route Frequency Provider Last Rate Last Dose  . 0.9 %  sodium chloride infusion   Intravenous Continuous Rise Patience, MD 75 mL/hr at 11/02/16 0258    . 0.9 %  sodium chloride infusion   Intravenous Once Blount, Scarlette Shorts T, NP      . acetaminophen (TYLENOL) tablet 650 mg  650 mg Oral Q6H PRN Rise Patience, MD       Or  . acetaminophen (TYLENOL) suppository 650 mg  650 mg Rectal Q6H PRN Rise Patience, MD      . cholecalciferol (VITAMIN D) tablet 400 Units  400 Units Oral Daily Rise Patience, MD   400 Units at 11/02/16 2423  . hydrALAZINE  (APRESOLINE) injection 10 mg  10 mg Intravenous Q4H PRN Rise Patience, MD      . Influenza vac split quadrivalent PF (FLUZONE HIGH-DOSE) injection 0.5 mL  0.5 mL Intramuscular Tomorrow-1000 Rise Patience, MD      . levothyroxine (SYNTHROID, LEVOTHROID) tablet 50 mcg  50 mcg Oral QAC breakfast Rise Patience, MD   50 mcg at 11/02/16 5361  . multivitamin with minerals tablet 1 tablet  1 tablet Oral Daily Rise Patience, MD      . nadolol (CORGARD) tablet 60 mg  60 mg Oral QHS Rise Patience, MD      . ondansetron [facility]) tablet 4 mg  4 mg Oral Q6H PRN Rise Patience, MD       Or  . ondansetron [facility]) injection 4 mg  4 mg Intravenous Q6H PRN Rise Patience, MD      . prednisoLONE acetate (PRED FORTE) 1 % ophthalmic suspension 1 drop  1 drop Right Eye Daily Rise Patience, MD   1 drop at 11/02/16 [phone]  . sodium chloride flush (NS) 0.9 % injection 10-40 mL  10-40 mL Intracatheter Q12H Rise Patience, MD      . timolol (BETIMOL) 0.5 % ophthalmic solution 1 drop  1 drop Left Eye BID Rise Patience, MD   1 drop at 11/02/16 1000  . vitamin B-12 (CYANOCOBALAMIN) tablet 1,000 mcg  1,000 mcg Oral Daily Rise Patience, MD   1,000 mcg at 11/02/16 5400   Facility-Administered Medications Ordered in Other Encounters  Medication Dose Route Frequency Provider Last Rate Last Dose  . sodium chloride 0.9 % injection 10 mL  10 mL Intravenous PRN Curt Bears, MD   10 mL at 05/17/12 0846    PAST MEDICAL HISTORY: Past Medical History:  Diagnosis Date  . Anemia   . Arthritis    knee /hip  . Blood transfusion   . Cancer ([place])    kidney cancer, NHL   . Cancer [facility])    last chemo 11/04/10   . Cataract    right  . Glaucoma   . Headache(784.0)    hx of migraines  . History of bladder infections   . History of blood transfusion   . History of shingles   . Hypercholesteremia    takes Zocor daily  . Hypertension    takes Nadolol daily    . Hypothyroid   . Hypothyroidism    takes Synthroid daily  . Non Hodgkin's lymphoma ([place])   . Peripheral neuropathy   . PONV (postoperative nausea and vomiting)   . Renal cell carcinoma   . Scoliosis   . Shortness of breath    with exertion  . Stroke ([place])    hx of TIA  . Thrush 12/17/2010  . TIA (transient ischemic attack)     PAST SURGICAL HISTORY: Past Surgical History:  Procedure Laterality Date  . left cataract removed    . MOUTH SURGERY    .  NEPHRECTOMY     Right Side  . PORTACATH PLACEMENT     right subclavian  . TONSILLECTOMY     as a child  . VIDEO BRONCHOSCOPY WITH ENDOBRONCHIAL NAVIGATION N/A 08/06/2012   Procedure: VIDEO BRONCHOSCOPY WITH ENDOBRONCHIAL NAVIGATION;  Surgeon: Collene Gobble, MD;  Location: [facility];  Service: Thoracic;  Laterality: N/A;  . VIDEO BRONCHOSCOPY WITH ENDOBRONCHIAL ULTRASOUND N/A 08/06/2012   Procedure: VIDEO BRONCHOSCOPY WITH ENDOBRONCHIAL ULTRASOUND;  Surgeon: Collene Gobble, MD;  [facility];  Service: Thoracic;  Laterality: N/A;    FAMILY HISTORY Family History  Problem Relation Age of Onset  . Colon cancer Father   . Stroke Mother    SOCIAL HISTORY:  The patient is divorced, and lives by herself.  She has 3 caregivers, who take shifts between 7 in the morning and 10 PM at night.  The patient is alone only between 10 PM and 7 AM.  The patient's son Aylana Hirschfeld is a Conservation officer, historic buildings formerly working for [employer], currently working at [employer].  He can be reached at [phone].  The patient's daughter Hurley Blevins lives in [place].  The patient's daughter Arbie Cookey lives in [place].  The patient's son Jeneen Rinks lives in [place].    ADVANCED DIRECTIVES: The patient does not have advanced directives and there is no healthcare power of attorney designated.  They have the papers on hand to complete on notarized.  The patient's daughter [name] feels [name] would be the most reasonable person to serve as the communicator with  physicians since he has a medical background  HEALTH MAINTENANCE: Social History  Substance Use Topics  . Smoking status: Former Smoker    Packs/day: 0.50    Years: 25.00    Quit date: 01/07/1992  . Smokeless tobacco: Never Used     Comment: quit many yrs ago  . Alcohol use No       OBJECTIVE: Elderly white woman examined in bed  Vitals:   11/01/16 2120 11/02/16 0512  BP: (!) 108/56 (!) 129/56  Pulse: 78 76  Resp: 18 18  Temp: 100.3 F (37.9 C) 98.2 F (36.8 C)  SpO2: 97% 97%     There is no height or weight on file to calculate BMI.    ECOG FS:4 - Bedbound    Lymphatic: No cervical or supraclavicular adenopathy Lungs no rales or rhonchi, auscultated anterolaterally Heart regular rate and rhythm Abd soft, nontender, positive bowel sounds Neuro: non-focal, positive affect  Breasts: Deferred   LAB RESULTS:  CMP     Component Value Date/Time   NA 131 (L) 11/02/2016 0544   NA 130 (L) 09/17/2016 1427   K 3.8 11/02/2016 0544   K 4.3 09/17/2016 1427   CL 104 11/02/2016 0544   CL 102 04/01/2012 0805   CO2 22 11/02/2016 0544   CO2 28 09/17/2016 1427   GLUCOSE 96 11/02/2016 0544   GLUCOSE 85 09/17/2016 1427   GLUCOSE 88 04/01/2012 0805   BUN 26 (H) 11/02/2016 0544   BUN 20.1 09/17/2016 1427   CREATININE 0.82 11/02/2016 0544   CREATININE 0.8 09/17/2016 1427   CALCIUM 8.5 (L) 11/02/2016 0544   CALCIUM 9.6 09/17/2016 1427   PROT 5.4 (L) 11/01/2016 2253   PROT 6.9 09/17/2016 1427   ALBUMIN 2.7 (L) 11/01/2016 2253   ALBUMIN 3.3 (L) 09/17/2016 1427   AST 39 11/01/2016 2253   AST 27 09/17/2016 1427   ALT 21 11/01/2016 2253   ALT 16 09/17/2016 1427  ALKPHOS 70 11/01/2016 2253   ALKPHOS 105 09/17/2016 1427   BILITOT 0.8 11/01/2016 2253   BILITOT 0.62 09/17/2016 1427   GFRNONAA >60 11/02/2016 0544   GFRAA >60 11/02/2016 0544    Lab Results  Component Value Date   KPAFRELGTCHN 2.41 (H) 05/06/2010   LAMBDASER 1.71 05/06/2010   KAPLAMBRATIO 1.41 05/06/2010     Lab Results  Component Value Date   TOTALPROTELP 6.7 05/06/2010   ALBUMINELP 56.6 05/06/2010   A1GS 6.8 (H) 05/06/2010   A2GS 7.8 05/06/2010   BETS 7.2 05/06/2010   BETA2SER 12.6 (H) 05/06/2010   GAMS 9.0 (L) 05/06/2010   MSPIKE 0.68 05/06/2010   SPEI * 05/06/2010    Lab Results  Component Value Date   WBC 0.5 (LL) 11/02/2016   NEUTROABS 0.1 (L) 11/02/2016   HGB 6.2 (LL) 11/02/2016   HCT 18.7 (L) 11/02/2016   MCV 97.4 11/02/2016   PLT 55 (L) 11/02/2016    _0 @  No results found for: LABCA2  No components found for: [id number]  No results for input(s): INR in the last 168 hours.  Urinalysis    Component Value Date/Time   COLORURINE YELLOW 11/01/2016 1250   APPEARANCEUR CLEAR 11/01/2016 1250   LABSPEC 1.015 11/01/2016 1250   LABSPEC 1.005 11/01/2010 1054   PHURINE 5.0 11/01/2016 1250   GLUCOSEU NEGATIVE 11/01/2016 1250   HGBUR NEGATIVE 11/01/2016 1250   BILIRUBINUR NEGATIVE 11/01/2016 1250   BILIRUBINUR Negative 11/01/2010 1054   KETONESUR NEGATIVE 11/01/2016 1250   PROTEINUR NEGATIVE 11/01/2016 1250   UROBILINOGEN 2.0 (H) 06/09/2013 2328   NITRITE NEGATIVE 11/01/2016 1250   LEUKOCYTESUR NEGATIVE 11/01/2016 1250   LEUKOCYTESUR Large 11/01/2010 1054    RADIOLOGY AND OTHER STUDIES: Dg Chest 2 View  Result Date: 10/17/2016 CLINICAL DATA:  Patient fell 3 days prior.  Injury to ankle. EXAM: CHEST  2 VIEW COMPARISON:  7/11/8 FINDINGS: Port in the RIGHT chest with tip in distal SVC. Normal cardiac silhouette. Lungs are clear. Atherosclerotic calcification aorta. No evidence of thoracic sinus IMPRESSION: No acute cardiopulmonary process. Aortic Atherosclerosis (ICD10-I70.0). Electronically Signed   By: Suzy Bouchard M.D.   On: 10/17/2016 18:08   Dg Ankle Complete Right  Result Date: 10/17/2016 CLINICAL DATA:  [place] at home 3 days ago injuring RIGHT foot and ankle, unable to bear weight, bruising especially dorsal to the distal metatarsals and at  lateral RIGHT ankle, swelling EXAM: RIGHT ANKLE - COMPLETE 3+ VIEW COMPARISON:  None FINDINGS: Minimal lateral soft tissue swelling. Bones demineralized. Joint spaces preserved. No acute fracture, dislocation, or bone destruction. IMPRESSION: No acute osseous abnormalities. Electronically Signed   By: Lavonia Dana M.D.   On: 10/17/2016 16:58   Ct Head Wo Contrast  Result Date: 11/01/2016 CLINICAL DATA:  83 year old with altered level of consciousness. EXAM: CT HEAD WITHOUT CONTRAST TECHNIQUE: Contiguous axial images were obtained from the base of the skull through the vertex without intravenous contrast. COMPARISON:  None. FINDINGS: Brain: Cerebral atrophy with diffuse low-density in the periventricular white matter. No evidence for acute hemorrhage, mass lesion, midline shift, hydrocephalus or large infarct. Vascular: No hyperdense vessel or unexpected calcification. Skull: Normal. Negative for fracture or focal lesion. Sinuses/Orbits: Mild mucosal disease in the posterior right ethmoid air cells. Other: None. IMPRESSION: No acute intracranial abnormality. Atrophy and evidence for chronic small vessel ischemic changes. Electronically Signed   By: Markus Daft M.D.   On: 11/01/2016 14:44   Dg Foot Complete Right  Result Date: 10/17/2016 CLINICAL DATA:  [place] at home  3 days ago injuring RIGHT foot and ankle, unable to bear weight, bruising especially dorsal to the distal metatarsals and at lateral RIGHT ankle, swelling EXAM: RIGHT FOOT COMPLETE - 3+ VIEW COMPARISON:  None FINDINGS: Diffuse osseous demineralization. Joint spaces preserved. Minimally distracted intra-articular fracture at head of proximal phalanx second toe. No additional fracture, dislocation, or bone destruction. Soft tissue swelling over the dorsal aspects of the distal metatarsals and MTP joints. IMPRESSION: Intra-articular fracture at head of proximal phalanx RIGHT second toe. No definite additional acute bony abnormalities. Electronically  Signed   By: Lavonia Dana M.D.   On: 10/17/2016 17:00      ASSESSMENT: 83 y.o. [place] woman with a history of  (1) non-Hodgkin's lymphoma, diagnosed 2012, status post CHOP/rituximab, with no evidence of recurrence  (2) status post right nephrectomy August 2012 for stage IV renal cell carcinoma, on chronic pazopanib  (3) urinary tract infection symptoms treated in the past 2 weeks with Septra, then Cipro  (4) pancytopenia  REVIEW OF BLOOD SMEAR: The blood film is significant only for rouleaux.  There are no nucleated blood cells, no schistocytes, no polychromasia, no platelet clumps, and no white cell immaturity noted  PLAN: I spent approximately 40 minutes with the patient and her daughter going over her new diagnosis of pancytopenia.  We reviewed the fact that all blood cells are made in the marrow of bones, reviewed the normal function of red cells, platelets and white cells, and reviewed the consequences of being low in these cells, namely, 4 red cells, shortness of breath, weakness, palpitations and dizziness, which can contribute to falls; for the platelets, bleeding; and further white cells, risk of infection.  We reviewed the reticulocyte count, which is low, indicating the fact that she is not making many red cells. Other parts of the anemia workup are in progress.  The blood film was not informative other than for rouleaux.  The patient did not have elevated protein studies 2 weeks ago but I will repeat these.  While it is encouraging that we do not see evidence of leukemia or myelodysplasia on the blood film, these conditions cannot be reliably ruled out except by bone marrow biopsy.  We discussed the chief options at this point with regards to the pancytopenia.  Possibly the Septra was responsible.  That drug has been discontinued. The pazopanib is being held as well. The patient's pancytopenia could spontaneously recover over the next week or 2 if one of those drugs was the  cause.  On the other hand we may be dealing with a primary marrow problem. If the patient has a myelodysplasia for example, or marrow involvement from one of her prior cancers, it would be important to know in terms of prognosis and whether a change to comfort care/ Hospice is appropriate at this point.  The patient and family will consider these options. The daughter looks to Dr Julien Nordmann for further direction and I will make sure he is aware of her admission.   Chauncey Cruel, MD   11/02/2016 10:45 AM Medical Oncology and Hematology [facility] [address] [place], [place] [address] Tel. [phone]    Fax. [phone]

## 2016-11-02 NOTE — Progress Notes (Signed)
Pt got very irritable when trying to get her to let us check to see if she had any incontinence and check her bp. Pt stated : I just want yall to leave me alone so I can sleep". Daughter was present in room and requested that we just let her mother rest for a while. Will continue to monitor pt and try again at a later time

## 2016-11-02 NOTE — Progress Notes (Signed)
PT Cancellation Note  Patient Details Name: Melissa Cross MRN: [id number] DOB: 08-Sep-1925   Cancelled Treatment:    Reason Eval/Treat Not Completed: Medical issues which prohibited therapy (Hgb 6.2, transfusion pending. Will follow. )   Philomena Doheny 11/02/2016, 7:25 AM [phone]

## 2016-11-02 NOTE — Progress Notes (Addendum)
Dr. Charlies Silvers aware via phone of Speech Therapy's recommendations with new order to receive vanilla Ensure Boost 3 x's daily. Md wanted a 2nd Unit of PRBCs therefore order received to give second unit today. Son at bedside and attentive.

## 2016-11-02 NOTE — Progress Notes (Addendum)
Patient ID: Melissa Cross, female   DOB: Jun 11, 1925, 83 y.o.   MRN: [id number]  PROGRESS NOTE    Melissa Cross  AUQ:[id number] DOB: 1925-11-19 DOA: 11/01/2016  PCP: Patient, No Pcp Per   Brief Narrative:  83 year old female with history of renal cell carcinoma, lymphoma, hypertension, hypothyroidism who presented to ED with progressive weakness, lethargy, poor by mouth intake over past 2 months prior to the admission. Patient is mostly wheelchair bound but was unable to get up at all due to weakness. Patient did have a fall and sustained a fracture on the right foot about 2 weeks prior to the admission.no acute infection identified on the admission. She was just recently on antibiotics for UTI, Cipro as well as Bactrim.   Assessment & Plan:   Principal Problem:   Weakness generalized - Unclear etiology although suspect from history of renal cell carcinoma, lymphoma and chemotherapy drug - Obtain PT once pt more alert and able to participate - CT head negative for acute findings   Active Problems:   Renal cell carcinoma of right kidney ([place]) - Oncology will see in consultation     Hyponatremia - Likely due to dehydration - Sodium improved with hydration - Sodium is 131 this am    Pancytopenia / Anemia due to antineoplastic therapy / Thrombocytopenia  - Likely secondary to sequela of chemotherapy drug, malignancy - Votrient on hold  - Appreciate oncology recommendations - Has not yet gotten 2 U PRBC  - May need Neupogen as well but will defer to oncology      Essential hypertension - Continue nadolol     DVT prophylaxis: SCD's Code Status: DNR/DNI  Family Communication: daughter at the bedside Disposition Plan: not yet stable for discharge    Consultants:   Oncology  PT  SLP  Procedures:   None   Antimicrobials:   None     Subjective: Restless overnight.   Objective: Vitals:   11/01/16 1655 11/01/16 2100 11/01/16 2120 11/02/16 0512  BP: (!) 130/59   (!) 108/56 (!) 129/56  Pulse: 78  78 76  Resp: 20  18 18   Temp: 99.4 F (37.4 C)  100.3 F (37.9 C) 98.2 F (36.8 C)  TempSrc: Oral  Oral Axillary  SpO2: 98%  97% 97%  Height:  5\' 4"  (1.626 m)      Intake/Output Summary (Last 24 hours) at 11/02/16 0912 Last data filed at 11/02/16 0600  Gross per 24 hour  Intake           1827.5 ml  Output                0 ml  Net           1827.5 ml   There were no vitals filed for this visit.  Examination:  General exam: Appears calm and comfortable, ill Respiratory system: diminished breath sounds, no wheezing  Cardiovascular system: S1 & S2 heard, Rate controlled  Gastrointestinal system: Abdomen is nondistended, soft and nontender. No organomegaly or masses felt. Normal bowel sounds heard. Central nervous system: sleeping, opens eyes briefly but looks tired  Extremities: no tenderness, palpable pulses  Skin: warm, dry  Psychiatry: not restless or agitated .   Data Reviewed: I have personally reviewed following labs and imaging studies  CBC:  Recent Labs Lab 11/01/16 1234 11/01/16 2253 11/02/16 0544  WBC 0.5* 0.6* 0.5*  NEUTROABS  --  0.1* PENDING  HGB 9.4* 6.7* 6.2*  HCT 27.6* 19.7* 18.7*  MCV 97.2  98.5 97.4  PLT 56* 53* 55*   Basic Metabolic Panel:  Recent Labs Lab 11/01/16 1234 11/01/16 2253 11/02/16 0544  NA 124* 128* 131*  K 4.5 4.2 3.8  CL 90* 99* 104  CO2 23 22 22   GLUCOSE 97 99 96  BUN 38* 29* 26*  CREATININE 1.17* 1.10* 0.82  CALCIUM 9.5 8.9 8.5*   GFR: CrCl cannot be calculated (Unknown ideal weight.). Liver Function Tests:  Recent Labs Lab 11/01/16 2253  AST 39  ALT 21  ALKPHOS 70  BILITOT 0.8  PROT 5.4*  ALBUMIN 2.7*   No results for input(s): LIPASE, AMYLASE in the last 168 hours. No results for input(s): AMMONIA in the last 168 hours. Coagulation Profile: No results for input(s): INR, PROTIME in the last 168 hours. Cardiac Enzymes: No results for input(s): CKTOTAL, CKMB, CKMBINDEX,  TROPONINI in the last 168 hours. BNP (last 3 results) No results for input(s): PROBNP in the last 8760 hours. HbA1C: No results for input(s): HGBA1C in the last 72 hours. CBG: No results for input(s): GLUCAP in the last 168 hours. Lipid Profile: No results for input(s): CHOL, HDL, LDLCALC, TRIG, CHOLHDL, LDLDIRECT in the last 72 hours. Thyroid Function Tests:  Recent Labs  11/01/16 2253  TSH 2.011   Anemia Panel:  Recent Labs  11/02/16 0544  RETICCTPCT 1.0   Urine analysis:    Component Value Date/Time   COLORURINE YELLOW 11/01/2016 1250   APPEARANCEUR CLEAR 11/01/2016 1250   LABSPEC 1.015 11/01/2016 1250   LABSPEC 1.005 11/01/2010 1054   PHURINE 5.0 11/01/2016 1250   GLUCOSEU NEGATIVE 11/01/2016 1250   HGBUR NEGATIVE 11/01/2016 1250   BILIRUBINUR NEGATIVE 11/01/2016 1250   BILIRUBINUR Negative 11/01/2010 1054   KETONESUR NEGATIVE 11/01/2016 1250   PROTEINUR NEGATIVE 11/01/2016 1250   UROBILINOGEN 2.0 (H) 06/09/2013 2328   NITRITE NEGATIVE 11/01/2016 1250   LEUKOCYTESUR NEGATIVE 11/01/2016 1250   LEUKOCYTESUR Large 11/01/2010 1054   Sepsis Labs: @LABRCNTIP (procalcitonin:4,lacticidven:4)   )No results found for this or any previous visit (from the past 240 hour(s)).    Radiology Studies: Ct Head Wo Contrast Result Date: 11/01/2016 No acute intracranial abnormality. Atrophy and evidence for chronic small vessel ischemic changes.    Scheduled Meds: . cholecalciferol  400 Units Oral Daily  . levothyroxine  50 mcg Oral QAC breakfast  . multivitamin with m  1 tablet Oral Daily  . nadolol  60 mg Oral QHS  . prednisoLONE   1 drop Right Eye Daily  . vitamin B-12  1,000 mcg Oral Daily   Continuous Infusions: . sodium chloride 75 mL/hr at 11/02/16 0258  . sodium chloride       LOS: 0 days    Time spent: 25 minutes  Greater than 50% of the time spent on counseling and coordinating the care.   Leisa Lenz, MD [facility] Pager [phone]  If  7PM-7AM, please contact night-coverage [email or web address] Password [email or web address] 11/02/2016, 9:12 AM

## 2016-11-03 ENCOUNTER — Ambulatory Visit (HOSPITAL_COMMUNITY): Admission: RE | Admit: 2016-11-03 | Payer: Medicare Other | Source: Ambulatory Visit

## 2016-11-03 ENCOUNTER — Telehealth: Payer: Self-pay | Admitting: Medical Oncology

## 2016-11-03 DIAGNOSIS — D702 Other drug-induced agranulocytosis: Secondary | ICD-10-CM

## 2016-11-03 DIAGNOSIS — R531 Weakness: Secondary | ICD-10-CM

## 2016-11-03 DIAGNOSIS — D649 Anemia, unspecified: Secondary | ICD-10-CM

## 2016-11-03 LAB — BASIC METABOLIC PANEL
Anion gap: 6 (ref 5–15)
BUN: 20 mg/dL (ref 6–20)
CO2: 22 mmol/L (ref 22–32)
CREATININE: 0.76 mg/dL (ref 0.44–1.00)
Calcium: 8.5 mg/dL — ABNORMAL LOW (ref 8.9–10.3)
Chloride: 106 mmol/L (ref 101–111)
GFR calc Af Amer: >60 mL/min (ref >60)
GLUCOSE: 96 mg/dL (ref 65–99)
POTASSIUM: 3.5 mmol/L (ref 3.5–5.1)
Sodium: 134 mmol/L — ABNORMAL LOW (ref 135–145)

## 2016-11-03 LAB — HEMOGLOBIN AND HEMATOCRIT, BLOOD
HEMATOCRIT: 24.8 % — AB (ref 36.0–46.0)
Hemoglobin: 8.7 g/dL — ABNORMAL LOW (ref 12.0–15.0)

## 2016-11-03 LAB — CBC
HCT: 23.3 % — ABNORMAL LOW (ref 36.0–46.0)
Hemoglobin: 7.9 g/dL — ABNORMAL LOW (ref 12.0–15.0)
MCH: 33.1 pg (ref 26.0–34.0)
MCHC: 33.9 g/dL (ref 30.0–36.0)
MCV: 97.5 fL (ref 78.0–100.0)
PLATELETS: 50 10*3/uL — AB (ref 150–400)
RBC: 2.39 MIL/uL — AB (ref 3.87–5.11)
RDW: 17.1 % — ABNORMAL HIGH (ref 11.5–15.5)
WBC: 0.9 10*3/uL — CL (ref 4.0–10.5)

## 2016-11-03 LAB — PREPARE RBC (CROSSMATCH)

## 2016-11-03 MED ORDER — DEXTROSE 5 % IV SOLN
2.0000 g | Freq: Once | INTRAVENOUS | Status: DC
  Filled 2016-11-03: qty 2

## 2016-11-03 MED ORDER — DEXTROSE 5 % IV SOLN
2.0000 g | Freq: Two times a day (BID) | INTRAVENOUS | Status: DC
  Administered 2016-11-03 – 2016-11-05 (×4): 2 g via INTRAVENOUS
  Filled 2016-11-03 (×5): qty 2

## 2016-11-03 MED ORDER — TBO-FILGRASTIM 300 MCG/0.5ML ~~LOC~~ SOSY
300.0000 ug | PREFILLED_SYRINGE | Freq: Every morning | SUBCUTANEOUS | Status: AC
  Administered 2016-11-04: 300 ug via SUBCUTANEOUS
  Filled 2016-11-03: qty 0.5

## 2016-11-03 MED ORDER — DEXTROSE 5 % IV SOLN
2.0000 g | Freq: Two times a day (BID) | INTRAVENOUS | Status: DC
  Filled 2016-11-03: qty 2

## 2016-11-03 MED ORDER — SODIUM CHLORIDE 0.9 % IV SOLN
Freq: Once | INTRAVENOUS | Status: AC
  Administered 2016-11-03: 21:00:00 via INTRAVENOUS

## 2016-11-03 NOTE — Progress Notes (Addendum)
Pharmacy Antibiotic Note  Melissa Cross is a 83 y.o. female admitted on 11/01/2016 with neutropenic fever.  Pharmacy has been consulted for cefepime dosing.  Plan:  Cefepime 2g IV q12 hr  F/u SCr  F/u Plt while on cefepime (can worsen thrombocytopenia)  Height: 5\' 4"  (162.6 cm) IBW/kg (Calculated) : 54.7  Temp (24hrs), Avg:98.8 F (37.1 C), Min:98 F (36.7 C), Max:100.5 F (38.1 C)   Recent Labs Lab 11/01/16 1234 11/01/16 2253 11/02/16 0544 11/03/16 0437  WBC 0.5* 0.6* 0.5* 0.9*  CREATININE 1.17* 1.10* 0.82 0.76    CrCl cannot be calculated (Unknown ideal weight.).    Allergies  Allergen Reactions  . Crab [Shellfish Allergy] Nausea And Vomiting  . Dilaudid [Hydromorphone Hcl] Other (See Comments)    Hallucinations and nausea    Thank you for allowing pharmacy to be a part of this patient's care.  Reuel Boom, PharmD, BCPS Pager: [phone] 11/03/2016, 11:16 AM

## 2016-11-03 NOTE — Progress Notes (Signed)
Pt to be transferred to room 1322. Daughter and Dr Charlies Silvers aware of transfer. Report called to [facility] on [place].

## 2016-11-03 NOTE — Telephone Encounter (Signed)
Son called to see if pt needed MRI still. Per Julien Nordmann I left voice mail that pt does not need MRI. I called son and cc CS and MRI cancelled.

## 2016-11-03 NOTE — Progress Notes (Addendum)
Patient ID: Melissa Cross, female   DOB: Apr 17, 1925, 83 y.o.   MRN: [id number]  PROGRESS NOTE    Melissa Cross  TKW:[id number] DOB: January 20, 1925 DOA: 11/01/2016  PCP: Patient, No Pcp Per   Brief Narrative:  83 year old female with history of renal cell carcinoma, lymphoma, hypertension, hypothyroidism who presented to ED with progressive weakness, lethargy, poor by mouth intake over past 2 months prior to the admission. Patient is mostly wheelchair bound but was unable to get up at all due to weakness. Patient did have a fall and sustained a fracture on the right foot about 2 weeks prior to the admission.no acute infection identified on the admission. She was just recently on antibiotics for UTI, Cipro as well as Bactrim.   Assessment & Plan:   Principal Problem:   Weakness generalized - Unclear etiology although suspect from history of renal cell carcinoma, lymphoma and chemotherapy drug - PT eval pending  - CT without acute findings  - Little more alert this am  Active Problems:   Febrile neutropenia - Will start empiric cefepime - Blood cx pending     Renal cell carcinoma of right kidney ([place]) - Appreciated heme/onc following     Hyponatremia - Due to dehydration - Sodium 134, improved with IV fluids     Pancytopenia / Anemia due to antineoplastic therapy / Thrombocytopenia  - Likely secondary to sequela of chemotherapy drug, malignancy or bactrim - Votrient on hold  - WBC count 0.9 this am, will get differential tomorrow am - Platelets stable - Hgb 7.9 this am, she has gotten 2 U PRBC transfusion 10/28    Essential hypertension - Continue nadolol      Severe protein calorie malnutrition - In the context of chronic illness    DVT prophylaxis: SCD's Code Status: DNR/DNI  Family Communication: daughter at bedside  Disposition Plan: not stable for discharge, home versus SNF once stable    Consultants:   Oncology  PT  SLP  Procedures:   None    Antimicrobials:   Cefepime    Subjective: No overnight events.  Objective: Vitals:   11/02/16 1428 11/02/16 1529 11/02/16 2140 11/03/16 0538  BP: (!) 114/50 129/72 130/62 (!) 126/57  Pulse: 68 70 90 78  Resp: 20 18 18 18   Temp: 98.4 F (36.9 C) 98 F (36.7 C) 98.2 F (36.8 C) (!) 100.5 F (38.1 C)  TempSrc: Axillary Axillary Oral Oral  SpO2: 97% 99% 95% 95%  Height:        Intake/Output Summary (Last 24 hours) at 11/03/16 0846 Last data filed at 11/03/16 0600  Gross per 24 hour  Intake          2453.33 ml  Output              180 ml  Net          2273.33 ml   There were no vitals filed for this visit.  Physical Exam  Constitutional: Appears ill, no distress  CVS: Rate controlled, S1/S2 + Pulmonary: Effort and breath sounds normal, no stridor, rhonchi, wheezes, rales.  Abdominal: Soft. BS +,  no distension, tenderness, rebound or guarding.  Musculoskeletal: Normal range of motion. No edema and no tenderness.  Neuro: Alert. No focal deficit  Skin: Skin is warm and dry. Has ecchymoses on LE Psychiatric: Normal mood and affect.     Data Reviewed: I have personally reviewed following labs and imaging studies  CBC:  Recent Labs Lab 11/01/16 1234 11/01/16 2253 11/02/16  5784 11/02/16 2018 11/03/16 0437  WBC 0.5* 0.6* 0.5*  --  0.9*  NEUTROABS  --  0.1* 0.1*  --   --   HGB 9.4* 6.7* 6.2* 8.7* 7.9*  HCT 27.6* 19.7* 18.7* 24.8* 23.3*  MCV 97.2 98.5 97.4  --  97.5  PLT 56* 53* 55*  --  50*   Basic Metabolic Panel:  Recent Labs Lab 11/01/16 1234 11/01/16 2253 11/02/16 0544 11/03/16 0437  NA 124* 128* 131* 134*  K 4.5 4.2 3.8 3.5  CL 90* 99* 104 106  CO2 23 22 22 22   GLUCOSE 97 99 96 96  BUN 38* 29* 26* 20  CREATININE 1.17* 1.10* 0.82 0.76  CALCIUM 9.5 8.9 8.5* 8.5*   GFR: CrCl cannot be calculated (Unknown ideal weight.). Liver Function Tests:  Recent Labs Lab 11/01/16 2253  AST 39  ALT 21  ALKPHOS 70  BILITOT 0.8  PROT 5.4*  ALBUMIN  2.7*   No results for input(s): LIPASE, AMYLASE in the last 168 hours. No results for input(s): AMMONIA in the last 168 hours. Coagulation Profile: No results for input(s): INR, PROTIME in the last 168 hours. Cardiac Enzymes: No results for input(s): CKTOTAL, CKMB, CKMBINDEX, TROPONINI in the last 168 hours. BNP (last 3 results) No results for input(s): PROBNP in the last 8760 hours. HbA1C: No results for input(s): HGBA1C in the last 72 hours. CBG: No results for input(s): GLUCAP in the last 168 hours. Lipid Profile: No results for input(s): CHOL, HDL, LDLCALC, TRIG, CHOLHDL, LDLDIRECT in the last 72 hours. Thyroid Function Tests:  Recent Labs  11/01/16 2253  TSH 2.011   Anemia Panel:  Recent Labs  11/02/16 0537 11/02/16 0544  VITAMINB12 1,364*  --   FOLATE 29.0  --   FERRITIN 3,247*  --   RETICCTPCT  --  1.0   Urine analysis:    Component Value Date/Time   COLORURINE YELLOW 11/01/2016 1250   APPEARANCEUR CLEAR 11/01/2016 1250   LABSPEC 1.015 11/01/2016 1250   LABSPEC 1.005 11/01/2010 1054   PHURINE 5.0 11/01/2016 1250   GLUCOSEU NEGATIVE 11/01/2016 1250   HGBUR NEGATIVE 11/01/2016 1250   BILIRUBINUR NEGATIVE 11/01/2016 1250   BILIRUBINUR Negative 11/01/2010 1054   KETONESUR NEGATIVE 11/01/2016 1250   PROTEINUR NEGATIVE 11/01/2016 1250   UROBILINOGEN 2.0 (H) 06/09/2013 2328   NITRITE NEGATIVE 11/01/2016 1250   LEUKOCYTESUR NEGATIVE 11/01/2016 1250   LEUKOCYTESUR Large 11/01/2010 1054   Sepsis Labs: @LABRCNTIP (procalcitonin:4,lacticidven:4)   )No results found for this or any previous visit (from the past 240 hour(s)).    Radiology Studies: Ct Head Wo Contrast Result Date: 11/01/2016 No acute intracranial abnormality. Atrophy and evidence for chronic small vessel ischemic changes.    Scheduled Meds: . cholecalciferol  400 Units Oral Daily  . levothyroxine  50 mcg Oral QAC breakfast  . multivitamin with m  1 tablet Oral Daily  . nadolol  60 mg Oral  QHS  . prednisoLONE   1 drop Right Eye Daily  . vitamin B-12  1,000 mcg Oral Daily   Continuous Infusions: . sodium chloride    . sodium chloride       LOS: 1 day    Time spent: 25 minutes  Greater than 50% of the time spent on counseling and coordinating the care.   Leisa Lenz, MD [facility] Pager [phone]  If 7PM-7AM, please contact night-coverage [email or web address] Password [email or web address] 11/03/2016, 8:46 AM

## 2016-11-03 NOTE — Progress Notes (Signed)
  Speech Language Pathology Treatment:    Patient Details Name: Melissa Cross MRN: [id number] DOB: 06-02-1925 Today's Date: 11/03/2016 Time: 2130-8657 SLP Time Calculation (min) (ACUTE ONLY): 25 min  Assessment / Plan / Recommendation Clinical Impression  Pt seen at bedside in follow up to BSE completed yesterday. Pt has difficulty with upright positioning due to pain with movement. Per daughter, pt continues to accept minimal po intake. Recommend higher nutrition liquids and purees (if taken) to maximize adequate nutrition and hydration. No overt s/s aspiration observed at this time, however, given reclined position aspiration risk is increased. Pt appeared to require additional time to orally process puree consistency.SLP provided education regarding oral prep, risk of posterior spillage, and swallow reflex related to airway closure and increased aspiration risk. Will continue to follow pt to assess diet tolerance and education. Pt is at increased risk for dehydration and malnutrition, as well as increased aspiration risk.     HPI HPI: Melissa Cross a 83 y.o.femalewith history of renal cellcarcinoma and lymphoma, hypertension, hypothyroidism was brought to the ER after patient was found to have progressive weakness unable to ambulate. As per the patient's son patient has been progressively getting weaker over the last 2 months and is wheelchair-bound and last few days unable to ambulate. 2 weeks ago patient had a fall and sustained a fracture of the right foot. Patient also at times was found to be confused and patient's oncologist has ordered MRI of the brain on Monday, October 29 which is pending. Since patient weakness has progressed patient was brought to the ER. As per the family patient unable to eat in the last 3 days with nausea/vomiting denies any diarrhea. Denies any abdominal pain but has been having some dysuria..  Last 4 weeks patient has been treated for dysuria and UTI with  4 courses of antibiotics last of which was Cipro and had taken Bactrim for 10 days.  CT head is negative for acute findings.  MRI is pending.  There is no recent chest imaging.        SLP Plan  Continue with current plan of care       Recommendations  Diet recommendations: Thin liquid Liquids provided via: Straw Medication Administration: Whole meds with liquid Supervision: Full supervision/cueing for compensatory strategies Compensations: Slow rate;Small sips/bites;Minimize environmental distractions Postural Changes and/or Swallow Maneuvers: Seated upright 90 degrees                Oral Care Recommendations: Oral care BID Follow up Recommendations:  (TBD) SLP Visit Diagnosis: Dysphagia, oral phase (R13.11) Plan: Continue with current plan of care       GO               Webber Michiels B. Quentin Ore [facility], CCC-SLP Speech Language Pathologist [phone]  Shonna Chock 11/03/2016, 12:18 PM

## 2016-11-03 NOTE — Progress Notes (Signed)
PT Cancellation Note  Patient Details Name: ALAYNE ESTRELLA MRN: [id number] DOB: 22-Mar-1925   Cancelled Treatment:    Reason Eval/Treat Not Completed: Attmepted PT eval-family declined eval on today. " I don't think she can. She can't move. She can't even handle being turned right now". Will check back another day.    Weston Anna, MPT Pager: [phone]

## 2016-11-03 NOTE — Progress Notes (Signed)
Subjective: The patient is seen and examined today. Her daughter was at the bedside.she is complaining of increasing fatigue and weakness. He was treated to an outpatient basis for urinary tract infection with several courses of antibiotics including Cipro and Septra. The patient was admitted to the hospital on 11/01/2016 with generalized weakness and confusion. She was supposed to have MRI of the brain today on outpatient basis but after she was admitted the patient had CT scan of thor any abnormality. She was also found to have pancytopenia. She denied having any fever or chills.  Objective: Vital signs in last 24 hours: Temp:  [98.2 F (36.8 C)-100.5 F (38.1 C)] 99.1 F (37.3 C) (10/29 1309) Pulse Rate:  [78-90] 78 (10/29 0935) Resp:  [18] 18 (10/29 0935) BP: (124-130)/(57-72) 124/72 (10/29 1309) SpO2:  [95 %-97 %] 96 % (10/29 1309) Weight:  [113 lb (51.3 kg)] 113 lb (51.3 kg) (10/29 0900)  Intake/Output from previous day: 10/28 0701 - 10/29 0700 In: 2453.3 [P.O.:470; I.V.:1682.5; Blood:300.8] Out: 180 [Urine:180] Intake/Output this shift: Total I/O In: -  Out: 120 [Urine:120]  General appearance: alert, cooperative, distracted, fatigued, no distress and pale Resp: clear to auscultation bilaterally Cardio: regular rate and rhythm, S1, S2 normal, no murmur, click, rub or gallop GI: soft, non-tender; bowel sounds normal; no masses,  no organomegaly Extremities: extremities normal, atraumatic, no cyanosis or edema  Lab Results:   Recent Labs  11/02/16 0544 11/02/16 2018 11/03/16 0437  WBC 0.5*  --  0.9*  HGB 6.2* 8.7* 7.9*  HCT 18.7* 24.8* 23.3*  PLT 55*  --  50*   BMET  Recent Labs  11/02/16 0544 11/03/16 0437  NA 131* 134*  K 3.8 3.5  CL 104 106  CO2 22 22  GLUCOSE 96 96  BUN 26* 20  CREATININE 0.82 0.76  CALCIUM 8.5* 8.5*    Studies/Results: No results found.  Medications: I have reviewed the patient's current medications.  CODE STATUS: no CODE  BLUE  Assessment/Plan: This is a very pleasant 83 years old white female with history of non-Hodgkin lymphoma as well as renal cell carcinoma and has been on treatment with reduced dose Votrient for several years. Her general condition has been declining recently. She was treated recently for your tract infection with several courses of antibiotics including Septra which probably resulted in significant pancytopenia.Her treatment was Septra was discontinued.  I had a lengthy discussion with the patient and her daughter about her current condition. Her pancytopenia is likely drug-induced secondary to Septra which was discontinued. Her blood count is improving slowly. I discussed with the patient and her daughter consideration of bone marrow biopsy and aspirate but she declined this option and I don't think it will be needed unless her pancytopenia persisted. I also discussed with the patient her daughter consideration of palliative care at this point. She is not a good candidate to resume any systemic therapy for her renal cell carcinoma. The patient and her daughter are in agreement with the current plan. I will arrange for her to have a follow-up appointment with me after discharge for repeat evaluation and discussion of her treatment options. Or the drug-induced neutropenia, I would recommend starting the patient on Granix 300 g subcutaneously daily until her absolute neutrophil count is over 1000. For the persistent anemia, consider giving the patient 2 more units of PRBCs transfusion. Thank you so much for taking good care of Ms. Cullom. I will continue to follow up the patient with you and assist  in her management an as-needed basis.    LOS: 1 day    Eilleen Kempf 11/03/2016

## 2016-11-04 DIAGNOSIS — R5381 Other malaise: Secondary | ICD-10-CM

## 2016-11-04 DIAGNOSIS — R5383 Other fatigue: Secondary | ICD-10-CM

## 2016-11-04 DIAGNOSIS — Z515 Encounter for palliative care: Secondary | ICD-10-CM

## 2016-11-04 DIAGNOSIS — D702 Other drug-induced agranulocytosis: Secondary | ICD-10-CM

## 2016-11-04 DIAGNOSIS — T50904A Poisoning by unspecified drugs, medicaments and biological substances, undetermined, initial encounter: Secondary | ICD-10-CM

## 2016-11-04 DIAGNOSIS — R627 Adult failure to thrive: Secondary | ICD-10-CM

## 2016-11-04 LAB — TYPE AND SCREEN
ABO/RH(D): O POS
Antibody Screen: NEGATIVE
Donor AG Type: NEGATIVE
Donor AG Type: NEGATIVE
Unit division: 0
Unit division: 0

## 2016-11-04 LAB — BPAM RBC
Blood Product Expiration Date: 201811252359
Blood Product Expiration Date: 201811272359
ISSUE DATE / TIME: 201810281126
ISSUE DATE / TIME: 201810292020
UNIT TYPE AND RH: 5100
Unit Type and Rh: 5100

## 2016-11-04 LAB — MULTIPLE MYELOMA PANEL, SERUM
ALBUMIN SERPL ELPH-MCNC: 2.3 g/dL — AB (ref 2.9–4.4)
ALBUMIN/GLOB SERPL: 1.1 (ref 0.7–1.7)
ALPHA 1: 0.3 g/dL (ref 0.0–0.4)
Alpha2 Glob SerPl Elph-Mcnc: 0.6 g/dL (ref 0.4–1.0)
B-Globulin SerPl Elph-Mcnc: 0.6 g/dL — ABNORMAL LOW (ref 0.7–1.3)
Gamma Glob SerPl Elph-Mcnc: 0.9 g/dL (ref 0.4–1.8)
Globulin, Total: 2.3 g/dL (ref 2.2–3.9)
IGA: 11 mg/dL — AB (ref 64–422)
IGM (IMMUNOGLOBULIN M), SRM: 505 mg/dL — AB (ref 26–217)
IgG (Immunoglobin G), Serum: 715 mg/dL (ref 700–1600)
M PROTEIN SERPL ELPH-MCNC: 0.2 g/dL — AB
TOTAL PROTEIN ELP: 4.6 g/dL — AB (ref 6.0–8.5)

## 2016-11-04 LAB — BASIC METABOLIC PANEL
Anion gap: 7 (ref 5–15)
BUN: 19 mg/dL (ref 6–20)
CHLORIDE: 104 mmol/L (ref 101–111)
CO2: 24 mmol/L (ref 22–32)
Calcium: 8.7 mg/dL — ABNORMAL LOW (ref 8.9–10.3)
Creatinine, Ser: 0.73 mg/dL (ref 0.44–1.00)
GFR calc Af Amer: >60 mL/min (ref >60)
GFR calc non Af Amer: >60 mL/min (ref >60)
Glucose, Bld: 103 mg/dL — ABNORMAL HIGH (ref 65–99)
POTASSIUM: 3.4 mmol/L — AB (ref 3.5–5.1)
SODIUM: 135 mmol/L (ref 135–145)

## 2016-11-04 LAB — CBC WITH DIFFERENTIAL/PLATELET
Basophils Absolute: 0 10*3/uL (ref 0.0–0.1)
Basophils Relative: 1 %
EOS PCT: 0 %
Eosinophils Absolute: 0 10*3/uL (ref 0.0–0.7)
HCT: 29.2 % — ABNORMAL LOW (ref 36.0–46.0)
Hemoglobin: 9.8 g/dL — ABNORMAL LOW (ref 12.0–15.0)
LYMPHS ABS: 0.6 10*3/uL — AB (ref 0.7–4.0)
Lymphocytes Relative: 56 %
MCH: 32.2 pg (ref 26.0–34.0)
MCHC: 33.6 g/dL (ref 30.0–36.0)
MCV: 96.1 fL (ref 78.0–100.0)
Monocytes Absolute: 0.1 10*3/uL (ref 0.1–1.0)
Monocytes Relative: 12 %
Neutro Abs: 0.3 10*3/uL — ABNORMAL LOW (ref 1.7–7.7)
Neutrophils Relative %: 31 %
Platelets: 37 10*3/uL — ABNORMAL LOW (ref 150–400)
RBC: 3.04 MIL/uL — AB (ref 3.87–5.11)
RDW: 17.3 % — AB (ref 11.5–15.5)
WBC: 1 10*3/uL — AB (ref 4.0–10.5)

## 2016-11-04 MED ORDER — POTASSIUM CHLORIDE CRYS ER 20 MEQ PO TBCR
40.0000 meq | EXTENDED_RELEASE_TABLET | Freq: Once | ORAL | Status: AC
  Administered 2016-11-04: 40 meq via ORAL
  Filled 2016-11-04: qty 2

## 2016-11-04 MED ORDER — HEPARIN SOD (PORK) LOCK FLUSH 100 UNIT/ML IV SOLN
500.0000 [IU] | Freq: Once | INTRAVENOUS | Status: AC
  Administered 2016-11-06: 500 [IU] via INTRAVENOUS
  Filled 2016-11-04: qty 5

## 2016-11-04 MED ORDER — LIDOCAINE 5 % EX PTCH
2.0000 | MEDICATED_PATCH | CUTANEOUS | Status: DC
  Administered 2016-11-04 – 2016-11-05 (×2): 2 via TRANSDERMAL
  Filled 2016-11-04 (×4): qty 2

## 2016-11-04 MED ORDER — SENNA 8.6 MG PO TABS
1.0000 | ORAL_TABLET | Freq: Every day | ORAL | Status: DC
  Administered 2016-11-04 – 2016-11-05 (×2): 8.6 mg via ORAL
  Filled 2016-11-04: qty 1

## 2016-11-04 MED ORDER — TBO-FILGRASTIM 300 MCG/0.5ML ~~LOC~~ SOSY: 300.0000 ug | PREFILLED_SYRINGE | Freq: Every day | SUBCUTANEOUS | Status: DC

## 2016-11-04 NOTE — Progress Notes (Signed)
PT Cancellation Note  Patient Details Name: Melissa Cross MRN: [id number] DOB: 1925/10/03   Cancelled Treatment:    Reason Eval/Treat Not Completed: PT screened, no needs identified, will sign off. Palliative medicine meeting with family. Plans are home with Hospice or residential. No further PT needs at this time. Please reorder if indicated.   Claretha Cooper 11/04/2016, 2:35 PM Tresa Endo PT [phone]

## 2016-11-04 NOTE — Progress Notes (Signed)
Patient ID: Melissa Cross, female   DOB: [birth year], 83 y.o.   MRN: [id number]  PROGRESS NOTE    Melissa Cross  QIH:[id number] DOB: 1925-05-10 DOA: 11/01/2016  PCP: Patient, No Pcp Per   Brief Narrative:  83 year old female with history of renal cell carcinoma, lymphoma, hypertension, hypothyroidism who presented to ED with progressive weakness, lethargy, poor by mouth intake over past 2 months prior to the admission. Patient is mostly wheelchair bound but was unable to get up at all due to weakness. Patient did have a fall and sustained a fracture on the right foot about 2 weeks prior to the admission.no acute infection identified on the admission. She was just recently on antibiotics for UTI, Cipro as well as Bactrim.   Assessment & Plan:   Principal Problem:   Weakness generalized - Unclear etiology although suspect from history of renal cell carcinoma, lymphoma and chemotherapy drug - PT eval pending  - CT head without acute findings   Active Problems:   Febrile neutropenia - Obtain CBC with differential - Granix started 10/29 - Blood cx so far showed no growth  - Continue cefepime, started 10/29     Renal cell carcinoma of right kidney ([place]) - Per oncology, patient is not a good candidate to resume any systemic therapy at this point    Hyponatremia - Due to dehydration - Sodium normalized with IV fluids     Pancytopenia / Anemia due to antineoplastic therapy / Thrombocytopenia  - Likely sequela to chemotherapeutic drug versus Bactrim, malignancy - White blood cell count is 1 this morning, Granix started 11/03/2016 - Hemoglobin is 9.8 after 2 units of PRBC transfusion 11/02/2016 - Platelet count is down from 50 to 37, monitor CBC daily    Essential hypertension - Continue nadolol    Severe protein calorie malnutrition - In the context of chronic illness - Liberalize the diet as tolerated    DVT prophylaxis: SCd's Code Status: DNR/DNI  Family Communication:  Daughter at bedside 11/03/2016, no family at the bedside this morning Disposition Plan: Patient is not yet stable for discharge. We are following white blood cell count and platelet count   Consultants:   Oncology, Dr. Julien Nordmann   PT  SLP  Procedures:   None    Antimicrobials:   Cefepime 10/29 -->    Subjective: No overnight events.   Objective: Vitals:   11/03/16 2036 11/03/16 2054 11/03/16 2320 11/04/16 0630  BP: (!) 143/77 (!) 141/71 136/74 (!) 150/73  Pulse: 87 81 82 76  Resp: 20 18 14 16   Temp: (!) 100.7 F (38.2 C) (!) 100.7 F (38.2 C) 98.8 F (37.1 C) 98.1 F (36.7 C)  TempSrc: Axillary Axillary Axillary   SpO2: 97% 95% 97% 96%  Weight:      Height:        Intake/Output Summary (Last 24 hours) at 11/04/16 0918 Last data filed at 11/04/16 0610  Gross per 24 hour  Intake              452 ml  Output              121 ml  Net              331 ml   Filed Weights   11/03/16 0900  Weight: 51.3 kg (113 lb)    Physical Exam  Constitutional: Appears malnourished, ill  CVS: RRR, S1/S2 + Pulmonary: Effort and breath sounds normal, no stridor, rhonchi, wheezes, rales.  Abdominal: Soft. BS +,  no distension, tenderness, rebound or guarding.  Musculoskeletal: Normal range of motion. No edema and no tenderness.  Lymphadenopathy: No lymphadenopathy noted, cervical, inguinal. Neuro: Alert. No cranial nerve deficit. Skin: Skin is warm and dry.  Psychiatric: Normal mood and affect.   Data Reviewed: I have personally reviewed following labs and imaging studies  CBC:  Recent Labs Lab 11/01/16 1234 11/01/16 2253 11/02/16 0544 11/02/16 2018 11/03/16 0437 11/04/16 0756  WBC 0.5* 0.6* 0.5*  --  0.9* 1.0*  NEUTROABS  --  0.1* 0.1*  --   --  0.3*  HGB 9.4* 6.7* 6.2* 8.7* 7.9* 9.8*  HCT 27.6* 19.7* 18.7* 24.8* 23.3* 29.2*  MCV 97.2 98.5 97.4  --  97.5 96.1  PLT 56* 53* 55*  --  50* 37*   Basic Metabolic Panel:  Recent Labs Lab 11/01/16 1234  11/01/16 2253 11/02/16 0544 11/03/16 0437 11/04/16 0756  NA 124* 128* 131* 134* 135  K 4.5 4.2 3.8 3.5 3.4*  CL 90* 99* 104 106 104  CO2 23 22 22 22 24   GLUCOSE 97 99 96 96 103*  BUN 38* 29* 26* 20 19  CREATININE 1.17* 1.10* 0.82 0.76 0.73  CALCIUM 9.5 8.9 8.5* 8.5* 8.7*   GFR: Estimated Creatinine Clearance: 39.4 mL/min (by C-G formula based on SCr of 0.73 mg/dL). Liver Function Tests:  Recent Labs Lab 11/01/16 2253  AST 39  ALT 21  ALKPHOS 70  BILITOT 0.8  PROT 5.4*  ALBUMIN 2.7*   No results for input(s): LIPASE, AMYLASE in the last 168 hours. No results for input(s): AMMONIA in the last 168 hours. Coagulation Profile: No results for input(s): INR, PROTIME in the last 168 hours. Cardiac Enzymes: No results for input(s): CKTOTAL, CKMB, CKMBINDEX, TROPONINI in the last 168 hours. BNP (last 3 results) No results for input(s): PROBNP in the last 8760 hours. HbA1C: No results for input(s): HGBA1C in the last 72 hours. CBG: No results for input(s): GLUCAP in the last 168 hours. Lipid Profile: No results for input(s): CHOL, HDL, LDLCALC, TRIG, CHOLHDL, LDLDIRECT in the last 72 hours. Thyroid Function Tests:  Recent Labs  11/01/16 2253  TSH 2.011   Anemia Panel:  Recent Labs  11/02/16 0537 11/02/16 0544  VITAMINB12 1,364*  --   FOLATE 29.0  --   FERRITIN 3,247*  --   RETICCTPCT  --  1.0   Urine analysis:    Component Value Date/Time   COLORURINE YELLOW 11/01/2016 1250   APPEARANCEUR CLEAR 11/01/2016 1250   LABSPEC 1.015 11/01/2016 1250   LABSPEC 1.005 11/01/2010 1054   PHURINE 5.0 11/01/2016 1250   GLUCOSEU NEGATIVE 11/01/2016 1250   HGBUR NEGATIVE 11/01/2016 1250   BILIRUBINUR NEGATIVE 11/01/2016 1250   BILIRUBINUR Negative 11/01/2010 1054   KETONESUR NEGATIVE 11/01/2016 1250   PROTEINUR NEGATIVE 11/01/2016 1250   UROBILINOGEN 2.0 (H) 06/09/2013 2328   NITRITE NEGATIVE 11/01/2016 1250   LEUKOCYTESUR NEGATIVE 11/01/2016 1250   LEUKOCYTESUR Large  11/01/2010 1054   Sepsis Labs: @LABRCNTIP (procalcitonin:4,lacticidven:4)   Recent Results (from the past 240 hour(s))  Culture, blood (Routine X 2) w Reflex to ID Panel     Status: None (Preliminary result)   Collection Time: 11/01/16  5:05 PM  Result Value Ref Range Status   Specimen Description BLOOD LEFT ARM  Final   Special Requests IN PEDIATRIC BOTTLE Blood Culture adequate volume  Final   Culture   Final    NO GROWTH 1 DAY Performed at [facility], [place],  [place] [address]    Report Status PENDING  Incomplete  Culture, blood (Routine X 2) w Reflex to ID Panel     Status: None (Preliminary result)   Collection Time: 11/01/16  5:12 PM  Result Value Ref Range Status   Specimen Description BLOOD LEFT HAND  Final   Special Requests   Final    BOTTLES DRAWN AEROBIC ONLY Blood Culture adequate volume   Culture   Final    NO GROWTH 1 DAY Performed at [facility], [place] [address], [place], [place] [address]    Report Status PENDING  Incomplete      Radiology Studies: Ct Head Wo Contrast Result Date: 11/01/2016 No acute intracranial abnormality. Atrophy and evidence for chronic small vessel ischemic changes.    Scheduled Meds: . cholecalciferol  400 Units Oral Daily  . levothyroxine  50 mcg Oral QAC breakfast  . multivitamin with m  1 tablet Oral Daily  . nadolol  60 mg Oral QHS  . prednisoLONE   1 drop Right Eye Daily  . vitamin B-12  1,000 mcg Oral Daily   Continuous Infusions: . sodium chloride    . ceFEPime (MAXIPIME) IV 2 g (11/04/16 0610)     LOS: 2 days    Time spent: 25 minutes  Greater than 50% of the time spent on counseling and coordinating the care.   Leisa Lenz, MD [facility] Pager [phone]  If 7PM-7AM, please contact night-coverage [email or web address] Password [email or web address] 11/04/2016, 9:18 AM

## 2016-11-04 NOTE — Consult Note (Signed)
Consultation Note Date: 11/04/2016   Patient Name: Melissa Cross  DOB: 1925/03/26  MRN: [id number]  Age / Sex: 83 y.o., female  PCP: Patient, No Pcp Per Referring Physician: Robbie Lis, MD  Reason for Consultation: Establishing goals of care  HPI/Patient Profile: 83 y.o. female  with past medical history of renal cell carcinoma, Non hodgkin's lymphoma, CVA, thyroid disease and recurrent UTIs who was admitted on 11/01/2016 with weakness and decline.  Melissa Cross has been followed by Dr. Julien Nordmann for non hodgkin's and has done well for years on Votrient therapy.  Recently she has developed recurrent UTIs that appear to have weakened her.  Earlier in October she fractured her right foot.  Admission work up revealed pancytopenia that is believed to be due to Septra therapy for UTI.  Clinical Assessment and Goals of Care:  I have reviewed medical records including EPIC notes, labs and imaging, received report from Dr. Julien Nordmann, assessed the patient at bedside and met with her daughter [name]  to discuss diagnosis prognosis, Ada, EOL wishes, disposition and options.  I introduced Palliative Medicine as specialized medical care for people living with serious illness. It focuses on providing relief from the symptoms and stress of a serious illness. The goal is to improve quality of life for both the patient and the family.  We discussed a brief life review of the patient. Melissa Cross had a career in [employer] and [employer].  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  She has 4 children (2 sons, 2 daughters).  She currently lives at home with help 9 hours a day, 7 days a week.  [name] tells me that recently she has been eating very little.  The patient reports that she has poor dentition so it is hard to chew and swallowing has become much more difficult.  [name] reports at  times her mother just holds food in her mouth as if she "checks out".   Indeed during our conversation the patient was quite fatigued and would lose her thought and stop speaking mid sentence.  As far as functional and nutritional status she is now unable to walk.  She is full assist unable to perform ADLs.  She is eating no solid food.  Today she drank an ensure.  We discussed her current illness and what it means in the larger context of her on-going co-morbidities.  Natural disease trajectory and expectations at EOL were discussed.  Ms. Stenglein asks with great insight - "How much of this can be reversed?"  Hospice and Palliative Care services outpatient were explained.    A meeting was scheduled with all 4 children for 10/31 at 9:00.  Questions and concerns were addressed.    Primary Decision Maker:  PATIENT    SUMMARY OF RECOMMENDATIONS     Her PO intake and how well her blood counts recover will likely determine if Hospice services at home or Hospice services at [facility] are recommended at the time of  discharge.  At this point patient would prefer to go to her own home.     If patient goes home she will need a hospital bed.   Will meet with patient and 4 children on 10/31 at 9:00.    Code Status/Advance Care Planning:  DNR   Symptom Management:   Patient has severe neuropathy in her feet and hands.  She states she has been on gabapentin in the past and had adverse symptoms (dizzy, fall)  Will trial lidoderm patch - one to each foot to see if it helps with neuropathy pain.  Senna QHS for "slow moving bowels"   Psycho-social/Spiritual:   Desire for further Chaplaincy support: no  Prognosis:  Weeks.  Given rapid decline, very poor PO intake and severely abnormal blood counts.    Discharge Planning: To Be Determined Home with hospice vs Hospice House.      Primary Diagnoses: Present on Admission: . Dehydration . Renal cell carcinoma of right kidney (HCC) .  Hyponatremia . Essential hypertension . Pancytopenia (HCC) . Hypothyroidism   I have reviewed the medical record, interviewed the patient and family, and examined the patient. The following aspects are pertinent.  Past Medical History:  Diagnosis Date  . Anemia   . Arthritis    knee /hip  . Blood transfusion   . Cancer (HCC)    kidney cancer, NHL   . Cancer [facility])    last chemo 11/04/10   . Cataract    right  . Glaucoma   . Headache(784.0)    hx of migraines  . History of bladder infections   . History of blood transfusion   . History of shingles   . Hypercholesteremia    takes Zocor daily  . Hypertension    takes Nadolol daily  . Hypothyroid   . Hypothyroidism    takes Synthroid daily  . Non Hodgkin's lymphoma (HCC)   . Peripheral neuropathy   . PONV (postoperative nausea and vomiting)   . Renal cell carcinoma   . Scoliosis   . Shortness of breath    with exertion  . Stroke (HCC)    hx of TIA  . Thrush 12/17/2010  . TIA (transient ischemic attack)    Social History   Social History  . Marital status: Divorced    Spouse name: N/A  . Number of children: N/A  . Years of education: N/A   Social History Main Topics  . Smoking status: Former Smoker    Packs/day: 0.50    Years: 25.00    Quit date: 01/07/1992  . Smokeless tobacco: Never Used     Comment: quit many yrs ago  . Alcohol use No  . Drug use: No  . Sexual activity: No   Other Topics Concern  . None   Social History Narrative  . None   Family History  Problem Relation Age of Onset  . Colon cancer Father   . Stroke Mother    Scheduled Meds: . cholecalciferol  400 Units Oral Daily  . feeding supplement  1 Container Oral TID BM  . Influenza vac split quadrivalent PF  0.5 mL Intramuscular Tomorrow-1000  . levothyroxine  50 mcg Oral QAC breakfast  . multivitamin with minerals  1 tablet Oral Daily  . nadolol  60 mg Oral QHS  . prednisoLONE acetate  1 drop Right Eye Daily  . sodium chloride  flush  10-40 mL Intracatheter Q12H  . timolol  1 drop Left Eye BID  . vitamin B-12  1,000 mcg Oral Daily   Continuous Infusions: . sodium chloride    . ceFEPime (MAXIPIME) IV 2 g (11/04/16 0610)   PRN Meds:.acetaminophen **OR** acetaminophen, hydrALAZINE, ondansetron **OR** ondansetron (ZOFRAN) IV Allergies  Allergen Reactions  . Crab [Shellfish Allergy] Nausea And Vomiting  . Dilaudid [Hydromorphone Hcl] Other (See Comments)    Hallucinations and nausea   Review of Systems  Severe pain in feet, pain in hands as well, trouble swallowing, trouble eating, overall weakness and fatigue, slow moving bowels.  Physical Exam  Frail, elderly female,  Appears very weak.  Has no energy.  Awake, alert, perfectly coherent.  Covered with multiple blankets. CV rrr, no m/r/g resp no distress Abdomen soft, nt, nd  Vital Signs: BP 126/64 (BP Location: Left Arm)   Pulse 79   Temp 99.6 F (37.6 C) (Axillary) Comment: Hana Teka, NS  Resp 12   Ht '5\' 4"'$  (1.626 m)   Wt 51.3 kg (113 lb)   SpO2 96%   BMI 19.40 kg/m  Pain Assessment: No/denies pain   Pain Score: 0-No pain   SpO2: SpO2: 96 % O2 Device:SpO2: 96 % O2 Flow Rate: .   IO: Intake/output summary:  Intake/Output Summary (Last 24 hours) at 11/04/16 1355 Last data filed at 11/04/16 1100  Gross per 24 hour  Intake             1092 ml  Output                1 ml  Net             1091 ml    LBM: Last BM Date: 10/30/16 Baseline Weight: Weight: 51.3 kg (113 lb) Most recent weight: Weight: 51.3 kg (113 lb)     Palliative Assessment/Data:20%     Time In: 1:00 Time Out: 2:20 Time Total: 80 min. Greater than 50%  of this time was spent counseling and coordinating care related to the above assessment and plan.  Signed by: Florentina Jenny, PA-C Palliative Medicine Pager: [phone]  Please contact Palliative Medicine Team phone at [phone] for questions and concerns.  For individual provider: See  Shea Evans

## 2016-11-05 ENCOUNTER — Telehealth: Payer: Self-pay | Admitting: Medical Oncology

## 2016-11-05 DIAGNOSIS — C641 Malignant neoplasm of right kidney, except renal pelvis: Principal | ICD-10-CM

## 2016-11-05 DIAGNOSIS — E871 Hypo-osmolality and hyponatremia: Secondary | ICD-10-CM

## 2016-11-05 DIAGNOSIS — I10 Essential (primary) hypertension: Secondary | ICD-10-CM

## 2016-11-05 LAB — BASIC METABOLIC PANEL
Anion gap: 9 (ref 5–15)
BUN: 21 mg/dL — ABNORMAL HIGH (ref 6–20)
CALCIUM: 8.8 mg/dL — AB (ref 8.9–10.3)
CHLORIDE: 100 mmol/L — AB (ref 101–111)
CO2: 24 mmol/L (ref 22–32)
Creatinine, Ser: 0.69 mg/dL (ref 0.44–1.00)
GFR calc Af Amer: >60 mL/min (ref >60)
GFR calc non Af Amer: >60 mL/min (ref >60)
Glucose, Bld: 103 mg/dL — ABNORMAL HIGH (ref 65–99)
Potassium: 3.4 mmol/L — ABNORMAL LOW (ref 3.5–5.1)
SODIUM: 133 mmol/L — AB (ref 135–145)

## 2016-11-05 LAB — CBC WITH DIFFERENTIAL/PLATELET
BASOS ABS: 0 10*3/uL (ref 0.0–0.1)
BASOS PCT: 2 %
Eosinophils Absolute: 0 10*3/uL (ref 0.0–0.7)
Eosinophils Relative: 0 %
HEMATOCRIT: 29.8 % — AB (ref 36.0–46.0)
HEMOGLOBIN: 10.3 g/dL — AB (ref 12.0–15.0)
LYMPHS ABS: 0.7 10*3/uL (ref 0.7–4.0)
Lymphocytes Relative: 40 %
MCH: 32.9 pg (ref 26.0–34.0)
MCHC: 34.6 g/dL (ref 30.0–36.0)
MCV: 95.2 fL (ref 78.0–100.0)
MONOS PCT: 5 %
Monocytes Absolute: 0.1 10*3/uL (ref 0.1–1.0)
NEUTROS ABS: 0.9 10*3/uL — AB (ref 1.7–7.7)
Neutrophils Relative %: 53 %
Platelets: 34 10*3/uL — ABNORMAL LOW (ref 150–400)
RBC: 3.13 MIL/uL — ABNORMAL LOW (ref 3.87–5.11)
RDW: 17.4 % — ABNORMAL HIGH (ref 11.5–15.5)
WBC: 1.7 10*3/uL — ABNORMAL LOW (ref 4.0–10.5)

## 2016-11-05 MED ORDER — GLYCERIN (LAXATIVE) 2.1 G RE SUPP
1.0000 | Freq: Once | RECTAL | Status: AC
Start: 2016-11-05 — End: 2016-11-05
  Administered 2016-11-05: 1 via RECTAL
  Filled 2016-11-05: qty 1

## 2016-11-05 MED ORDER — POTASSIUM CHLORIDE CRYS ER 20 MEQ PO TBCR
20.0000 meq | EXTENDED_RELEASE_TABLET | Freq: Once | ORAL | Status: AC
  Administered 2016-11-05: 20 meq via ORAL
  Filled 2016-11-05: qty 1

## 2016-11-05 NOTE — Telephone Encounter (Signed)
Called to [facility]

## 2016-11-05 NOTE — Progress Notes (Signed)
Daily Progress Note   Patient Name: Melissa Cross       Date: 11/05/2016 DOB: 08/03/1925  Age: 83 y.o. MRN#: [id number] Attending Physician: Oswald Hillock, MD Primary Care Physician: Patient, No Pcp Per Admit Date: 11/01/2016  Reason for Consultation/Follow-up: Establishing goals of care, Hospice Evaluation and Psychosocial/spiritual support  Subjective: Met with patient and 4 children.  After conversation about current condition, reversible and non reversible conditions, and the patient's wishes, the decision is made for home with hospice services.   Patient has experience with [facility] and would like to use them.  She will need a hospital bed in the home.  Family requests that she be medically optimized before discharge.   Conversely patient would like to d/c to home as soon as possible.   Assessment:  83 yo lethargic but coherent female, coughing/aspirating on her own saliva as well as sips of water.  Eating no solid foods.  C/o dysphagia.  She is bedbound and very weak.      Patient Profile/HPI: 83 y.o. female  with past medical history of renal cell carcinoma, Non hodgkin's lymphoma, CVA, thyroid disease and recurrent UTIs who was admitted on 11/01/2016 with weakness and decline.  Melissa Cross has been followed by Dr. Julien Nordmann for non hodgkin's and has done well for years on Votrient therapy.  Recently she has developed recurrent UTIs that appear to have weakened her.  Earlier in October she fractured her right foot.  Admission work up revealed pancytopenia that is believed to be due to Septra therapy for UTI.   Length of Stay: 3  Current Medications: Scheduled Meds:  . feeding supplement  1 Container Oral TID BM  . Glycerin (Adult)  1 suppository Rectal Once  . heparin  lock flush  500 Units Intravenous Once  . levothyroxine  50 mcg Oral QAC breakfast  . lidocaine  2 patch Transdermal Q24H  . nadolol  60 mg Oral QHS  . prednisoLONE acetate  1 drop Right Eye Daily  . senna  1 tablet Oral QHS  . sodium chloride flush  10-40 mL Intracatheter Q12H  . timolol  1 drop Left Eye BID  . vitamin B-12  1,000 mcg Oral Daily    Continuous Infusions: . sodium chloride    . ceFEPime (MAXIPIME) IV 2 g (11/05/16  28)    PRN Meds: acetaminophen **OR** acetaminophen, hydrALAZINE, ondansetron **OR** ondansetron (ZOFRAN) IV  Physical Exam       Comfortable appearing elderly female.  Alert, coherent, but appears very weak and tired CV rrr, no m/r/g resp no distress, no w/c/r Abdomen soft, slightly protuberant LE with no edema.  Very tender to touch/move.  Vital Signs: BP (!) 146/91 (BP Location: Right Arm)   Pulse 78   Temp 98 F (36.7 C) (Axillary)   Resp 18   Ht '5\' 4"'$  (1.626 m)   Wt 71.7 kg (158 lb)   SpO2 97%   BMI 27.12 kg/m  SpO2: SpO2: 97 % O2 Device: O2 Device: Not Delivered O2 Flow Rate:    Intake/output summary:  Intake/Output Summary (Last 24 hours) at 11/05/16 1002 Last data filed at 11/05/16 0500  Gross per 24 hour  Intake              640 ml  Output             1000 ml  Net             -360 ml   LBM: Last BM Date: 10/30/16 Baseline Weight: Weight: 51.3 kg (113 lb) Most recent weight: Weight: 71.7 kg (158 lb)       Palliative Assessment/Data:  20%      Patient Active Problem List   Diagnosis Date Noted  . Drug-induced neutropenia ([place])   . Adult failure to thrive   . Palliative care encounter   . Dehydration 11/01/2016  . Weakness generalized 11/01/2016  . Pancytopenia ([place]) 11/01/2016  . Hypothyroidism 11/01/2016  . Weakness 11/01/2016  . Neuropathy 05/19/2016  . Port catheter in place 08/23/2015  . Encounter for antineoplastic chemotherapy 06/28/2014  . Hyponatremia 06/10/2013  . Hypokalemia 06/10/2013  . UTI (lower  urinary tract infection) 06/10/2013  . Essential hypertension 06/10/2013  . Malnutrition of moderate degree ([place]) 06/10/2013  . Pulmonary nodule 08/06/2012  . Renal cell carcinoma of right kidney ([place]) 09/30/2011  . Thrush 12/17/2010  . Lymphoma, large-cell, diffuse ([place]) 11/13/2010    Palliative Care Plan    Recommendations/Plan:  Home with hospice when medically optimized and hospice bed is in place.  (Tomorrow?)  Follow up with Dr. Julien Nordmann as planned and in accordance with patient's wishes.  Discussed symptom management - but patient feels she does better with fewer interventions (specifically no gabapentin)  Patient unable to tolerate foley cath.  Family would like to figure out how to configure an external purewick like the one we have in the hospital.  Will discontinue multivitamin and maintenance medications to reduce pill burden.  Goals of Care and Additional Recommendations:  Limitations on Scope of Treatment: Full Comfort Care  Code Status:  DNR  Prognosis:   < 2 weeks secondary to lethargy, bedbound status and minimal PO intake.  Patient aspirating.   Discharge Planning:  Home with Hospice  Care plan was discussed with patient, family, attending, case mgmt  Thank you for allowing the Palliative Medicine Team to assist in the care of this patient.  Total time spent:  60 min.     Greater than 50%  of this time was spent counseling and coordinating care related to the above assessment and plan.  Melissa Jenny, PA-C Palliative Medicine  Please contact Palliative MedicineTeam phone at [phone] for questions and concerns between 7 am - 7 pm.   Please see AMION for individual provider pager numbers.

## 2016-11-05 NOTE — Progress Notes (Signed)
[facility] Liaison: RN visit  Notified by Marney Doctor [facility] of patient/family request for [facility] services at home after discharge. Chart and patient information under review by [facility] physician. Hospice eligibility pending at this time.  Writer spoke with patient and son at bedside to initiate education related to hospice philosophy, services and team approach to care. Verbalized understanding of information given. Per discussion, plan is for discharge to home by [facility] 11/06/2016.   Please send signed and completed DNR form home with patient/family. Patient will need prescriptions for discharge comfort medications.  DME needs have been discussed, patient currently has the following equipment in the home:3N1, Walker, W/C . Patient/family requests the following DME for delivery to the home: hospital bed, OBT. [facility] equipment manager has been notified and will contact [facility] to arrange delivery to the home. Home address has been verified and is correct in the chart. [name] is the family member to contact to arrange time of delivery.  [facility] Referral Center aware of the above. Please notify [facility] when patient is ready to leave the unit at discharge. (Call [phone] or [phone] after 5pm.) [facility] information and contact numbers given to at time of visit. Above information shared with CMRN.  Please call with any hospice related questions.  Thank you for this referral.  Farrel Gordon, RN, [facility] Liaison [phone] ?

## 2016-11-05 NOTE — Care Management Note (Signed)
Case Management Note  Patient Details  Name: Melissa Cross MRN: [id number] Date of Birth: [birth year]  Subjective/Objective:    83 yo admitted with Weakness. Hx of renal cell carcinoma                Action/Plan: From home alone. To DC with home hospice services. Choice offered to pt children at bedside and [facility] chosen. Referral called to [facility]. Request made for hospital bed and external catheter with suction.  Expected Discharge Date:                  Expected Discharge Plan:  Home w Hospice Care  In-House Referral:     Discharge planning Services  CM Consult  Post Acute Care Choice:  Hospice Choice offered to:  Adult Children  DME Arranged:    DME Agency:     HH Arranged:  Disease Management [place] Agency:  [facility]  Status of Service:  In process, will continue to follow  If discussed at Long Length of Stay Meetings, dates discussed:    Additional CommentsLynnell Catalan, RN 11/05/2016, 10:14 AM [phone]

## 2016-11-05 NOTE — Progress Notes (Signed)
[facility]  PROGRESS NOTE  Melissa Cross MRN:[id number] DOB: 01/14/1925 DOA: 11/01/2016 PCP: Patient, No Pcp Per   Brief HPI:    83-year-old female with history of renal cell carcinoma, lymphoma, hypertension, hypothyroidism who presented to ED with progressive weakness, lethargy, poor by mouth intake over past 2 months prior to the admission. Patient is mostly wheelchair bound but was unable to get up at all due to weakness. Patient did have a fall and sustained a fracture on the right foot about 2 weeks prior to the admission.no acute infection identified on the admission. She was just recently on antibiotics for UTI, Cipro as well as Bactrim.   Subjective   Patient seen and examined, continues to be lethargic.   Assessment/Plan:     1. Generalized weakness-unclear etiology.  Likely from history of renal cell carcinoma, lymphoma, chemotherapy-induced.  CT head showed no acute finding.   2. Febrile neutropenia-patient started on Granix.  Blood cultures showed no growth.  Cefepime has been discontinued 3. Renal cell carcinoma-Per oncology patient is not a good candidate for systemic therapy 4. Hyponatremia-resolved, likely from dehydration 5. Pancytopenia/anemia due to antineoplastic therapy/ thrombocytopenia-likely due to chemotherapy versus Bactrim.  WBC was 1.0 yesterday.  Will repeat CBC today.  Hemoglobin 9.8 after 2 units PRBC on 10/25/2016.  Platelet count is 37. 6. Hypertension-continue nadolol 7. Goals of care discussion-patient family met with palliative care, patient is DNR/DNI.  Plan to take patient home with hospice.    DVT prophylaxis: SCDs  Code Status: DNR/DNI  Family Communication: No family at bedside  Disposition Plan: Likely home with hospice in a.m.   Consultants:  Oncology  Palliative care  Procedures:  None  Continuous infusions . sodium chloride        Antibiotics:   Anti-infectives    Start     Dose/Rate Route Frequency Ordered  Stop   11/03/16 2200  ceFEPIme (MAXIPIME) 2 g in dextrose 5 % 50 mL IVPB  Status:  Discontinued     2 g 100 mL/hr over 30 Minutes Intravenous Every 12 hours 11/03/16 0858 11/03/16 1324   11/03/16 1400  ceFEPIme (MAXIPIME) 2 g in dextrose 5 % 50 mL IVPB  Status:  Discontinued     2 g 100 mL/hr over 30 Minutes Intravenous Every 12 hours 11/03/16 1324 11/05/16 1036   11/03/16 0930  ceFEPIme (MAXIPIME) 2 g in dextrose 5 % 50 mL IVPB  Status:  Discontinued     2 g 100 mL/hr over 30 Minutes Intravenous  Once 11/03/16 0855 11/03/16 1323       Objective   Vitals:   11/04/16 2059 11/05/16 0133 11/05/16 0449 11/05/16 1430  BP: 128/76  (!) 146/91 130/72  Pulse: 76  78 73  Resp: 18  18 18  Temp: 99 F (37.2 C)  98 F (36.7 C) 99.5 F (37.5 C)  TempSrc: Axillary  Axillary Oral  SpO2: 99%  97% 96%  Weight:  71.7 kg (158 lb)    Height:        Intake/Output Summary (Last 24 hours) at 11/05/16 1557 Last data filed at 11/05/16 0500  Gross per 24 hour  Intake                0 ml  Output             1000 ml  Net            -1000 ml   Filed Weights   11/03/16 0900 11/05/16 0133    Weight: 51.3 kg (113 lb) 71.7 kg (158 lb)     Physical Examination:   Physical Exam: Eyes: No icterus, extraocular muscles intact  Mouth: Oral mucosa is moist, no lesions on palate,  Neck: Supple, no deformities, masses, or tenderness Lungs: Normal respiratory effort, bilateral clear to auscultation, no crackles or wheezes.  Heart: Regular rate and rhythm, S1 and S2 normal, no murmurs, rubs auscultated Abdomen: BS normoactive,soft,nondistended,non-tender to palpation,no organomegaly Extremities: No pretibial edema, no erythema, no cyanosis, no clubbing Neuro : Alert and oriented to time, place and person, No focal deficits        Data Reviewed: I have personally reviewed following labs and imaging studies  CBG: No results for input(s): GLUCAP in the last 168 hours.  CBC:  Recent Labs Lab  11/01/16 1234 11/01/16 2253 11/02/16 0544 11/02/16 2018 11/03/16 0437 11/04/16 0756  WBC 0.5* 0.6* 0.5*  --  0.9* 1.0*  NEUTROABS  --  0.1* 0.1*  --   --  0.3*  HGB 9.4* 6.7* 6.2* 8.7* 7.9* 9.8*  HCT 27.6* 19.7* 18.7* 24.8* 23.3* 29.2*  MCV 97.2 98.5 97.4  --  97.5 96.1  PLT 56* 53* 55*  --  50* 37*    Basic Metabolic Panel:  Recent Labs Lab 11/01/16 2253 11/02/16 0544 11/03/16 0437 11/04/16 0756 11/05/16 0640  NA 128* 131* 134* 135 133*  K 4.2 3.8 3.5 3.4* 3.4*  CL 99* 104 106 104 100*  CO2 22 22 22 24 24  GLUCOSE 99 96 96 103* 103*  BUN 29* 26* 20 19 21*  CREATININE 1.10* 0.82 0.76 0.73 0.69  CALCIUM 8.9 8.5* 8.5* 8.7* 8.8*    Recent Results (from the past 240 hour(s))  Culture, blood (Routine X 2) w Reflex to ID Panel     Status: None (Preliminary result)   Collection Time: 11/01/16  5:05 PM  Result Value Ref Range Status   Specimen Description BLOOD LEFT ARM  Final   Special Requests IN PEDIATRIC BOTTLE Blood Culture adequate volume  Final   Culture   Final    NO GROWTH 3 DAYS Performed at [facility], [address] [place], [place] [address]    Report Status PENDING  Incomplete  Culture, blood (Routine X 2) w Reflex to ID Panel     Status: None (Preliminary result)   Collection Time: 11/01/16  5:12 PM  Result Value Ref Range Status   Specimen Description BLOOD LEFT HAND  Final   Special Requests   Final    BOTTLES DRAWN AEROBIC ONLY Blood Culture adequate volume   Culture   Final    NO GROWTH 3 DAYS Performed at [facility], [address] [place], [place] [address]    Report Status PENDING  Incomplete     Liver Function Tests:  Recent Labs Lab 11/01/16 2253  AST 39  ALT 21  ALKPHOS 70  BILITOT 0.8  PROT 5.4*  ALBUMIN 2.7*   No results for input(s): LIPASE, AMYLASE in the last 168 hours. No results for input(s): AMMONIA in the last 168 hours.  Cardiac Enzymes: No results for input(s): CKTOTAL, CKMB, CKMBINDEX, TROPONINI  in the last 168 hours. BNP (last 3 results) No results for input(s): BNP in the last 8760 hours.  ProBNP (last 3 results) No results for input(s): PROBNP in the last 8760 hours.    Studies: No results found.  Scheduled Meds: . feeding supplement  1 Container Oral TID BM  . heparin lock flush    500 Units Intravenous Once  . levothyroxine  50 mcg Oral QAC breakfast  . lidocaine  2 patch Transdermal Q24H  . nadolol  60 mg Oral QHS  . prednisoLONE acetate  1 drop Right Eye Daily  . senna  1 tablet Oral QHS  . sodium chloride flush  10-40 mL Intracatheter Q12H  . timolol  1 drop Left Eye BID  . vitamin B-12  1,000 mcg Oral Daily      Time spent: 25 min  LAMA,GAGAN S   [facility] Pager [phone]. If 7PM-7AM, please contact night-coverage at [email or web address], Office  [phone]  password [email or web address]  11/05/2016, 3:57 PM  LOS: 3 days             

## 2016-11-06 DIAGNOSIS — Z515 Encounter for palliative care: Secondary | ICD-10-CM

## 2016-11-06 DIAGNOSIS — R627 Adult failure to thrive: Secondary | ICD-10-CM

## 2016-11-06 LAB — CBC
HEMATOCRIT: 30.7 % — AB (ref 36.0–46.0)
Hemoglobin: 10 g/dL — ABNORMAL LOW (ref 12.0–15.0)
MCH: 31.4 pg (ref 26.0–34.0)
MCHC: 32.6 g/dL (ref 30.0–36.0)
MCV: 96.5 fL (ref 78.0–100.0)
Platelets: 33 10*3/uL — ABNORMAL LOW (ref 150–400)
RBC: 3.18 MIL/uL — AB (ref 3.87–5.11)
RDW: 17.4 % — ABNORMAL HIGH (ref 11.5–15.5)
WBC: 1.3 10*3/uL — AB (ref 4.0–10.5)

## 2016-11-06 LAB — BASIC METABOLIC PANEL
Anion gap: 9 (ref 5–15)
BUN: 22 mg/dL — AB (ref 6–20)
CHLORIDE: 102 mmol/L (ref 101–111)
CO2: 24 mmol/L (ref 22–32)
Calcium: 8.8 mg/dL — ABNORMAL LOW (ref 8.9–10.3)
Creatinine, Ser: 0.64 mg/dL (ref 0.44–1.00)
GFR calc non Af Amer: >60 mL/min (ref >60)
Glucose, Bld: 92 mg/dL (ref 65–99)
Potassium: 3.8 mmol/L (ref 3.5–5.1)
SODIUM: 135 mmol/L (ref 135–145)

## 2016-11-06 MED ORDER — ACETAMINOPHEN 325 MG PO TABS: 650.0000 mg | ORAL_TABLET | Freq: Four times a day (QID) | ORAL | 2 refills | Status: AC | PRN

## 2016-11-06 MED ORDER — TBO-FILGRASTIM 300 MCG/0.5ML ~~LOC~~ SOSY
300.0000 ug | PREFILLED_SYRINGE | Freq: Every day | SUBCUTANEOUS | Status: DC
  Administered 2016-11-06: 300 ug via SUBCUTANEOUS
  Filled 2016-11-06: qty 0.5

## 2016-11-06 NOTE — Progress Notes (Addendum)
Daily Progress Note   Patient Name: Melissa Cross       Date: 11/06/2016 DOB: Jun 01, 1925  Age: 83 y.o. MRN#: [id number] Attending Physician: Oswald Hillock, MD Primary Care Physician: Patient, No Pcp Per Admit Date: 11/01/2016  Reason for Consultation/Follow-up: Establishing goals of care and Psychosocial/spiritual support  Subjective: Talked with MD and then patient's family and patient.  Unfortunately she did not receive a granix injection for the last two days.  Dr. Darrick Meigs offered her the opportunity to stay and receive the injections over the next few days.  The patient was very firm in her response - "I hate it here".  We discussed a few other things and I asked the question again about staying another day or so.  She replied "I hate it here".  I mentioned it may improve her immune system - and she asked me "What would you do?".  I had to respond that I would get 1 injection today and go home.  The patient is firm in her decision to go home today.   Assessment: Elderly but very cognizant female.  No longer eating.  Very little energy.  Sleeps most of the time.   Patient Profile/HPI:  83 y.o. female  with past medical history of renal cell carcinoma, Non hodgkin's lymphoma, CVA, thyroid disease and recurrent UTIs who was admitted on 11/01/2016 with weakness and decline.  Melissa Cross has been followed by Dr. Julien Nordmann for non hodgkin's and has done well for years on Votrient therapy.  Recently she has developed recurrent UTIs that appear to have weakened her.  Earlier in October she fractured her right foot.  Admission work up revealed pancytopenia that is believed to be due to Septra therapy for UTI.    Length of Stay: 4  Current Medications: Scheduled Meds:  . feeding supplement  1 Container  Oral TID BM  . heparin lock flush  500 Units Intravenous Once  . levothyroxine  50 mcg Oral QAC breakfast  . lidocaine  2 patch Transdermal Q24H  . nadolol  60 mg Oral QHS  . prednisoLONE acetate  1 drop Right Eye Daily  . senna  1 tablet Oral QHS  . sodium chloride flush  10-40 mL Intracatheter Q12H  . Tbo-filgastrim (GRANIX) SQ  300 mcg Subcutaneous Daily  . timolol  1  drop Left Eye BID  . vitamin B-12  1,000 mcg Oral Daily    Continuous Infusions: . sodium chloride      PRN Meds: acetaminophen **OR** acetaminophen, hydrALAZINE, ondansetron **OR** ondansetron (ZOFRAN) IV  Physical Exam       Wakes to my touch.  NAD.  Awake, coherent.  Son Melissa Cross at bedside. CV rrr resp nad Abdomen soft   Vital Signs: BP 140/74 (BP Location: Left Arm)   Pulse 69   Temp 98.1 F (36.7 C) (Oral)   Resp 18   Ht 5\' 4"  (1.626 m)   Wt 70.8 kg (156 lb)   SpO2 98%   BMI 26.78 kg/m  SpO2: SpO2: 98 % O2 Device: O2 Device: Not Delivered O2 Flow Rate:    Intake/output summary:  Intake/Output Summary (Last 24 hours) at 11/06/16 1219 Last data filed at 11/06/16 0206  Gross per 24 hour  Intake              480 ml  Output             1200 ml  Net             -720 ml   LBM: Last BM Date: 10/30/16 Baseline Weight: Weight: 51.3 kg (113 lb) Most recent weight: Weight: 70.8 kg (156 lb)       Palliative Assessment/Data: 20%      Patient Active Problem List   Diagnosis Date Noted  . Drug-induced neutropenia ([place])   . Adult failure to thrive   . Palliative care encounter   . Dehydration 11/01/2016  . Weakness generalized 11/01/2016  . Pancytopenia ([place]) 11/01/2016  . Hypothyroidism 11/01/2016  . Weakness 11/01/2016  . Neuropathy 05/19/2016  . Port catheter in place 08/23/2015  . Encounter for antineoplastic chemotherapy 06/28/2014  . Hyponatremia 06/10/2013  . Hypokalemia 06/10/2013  . UTI (lower urinary tract infection) 06/10/2013  . Essential hypertension 06/10/2013  .  Malnutrition of moderate degree ([place]) 06/10/2013  . Pulmonary nodule 08/06/2012  . Renal cell carcinoma of right kidney ([place]) 09/30/2011  . Thrush 12/17/2010  . Lymphoma, large-cell, diffuse ([place]) 11/13/2010    Palliative Care Plan    Recommendations/Plan:  Granix injection then home with hospice today.  Goals of Care and Additional Recommendations:  Limitations on Scope of Treatment: Full Comfort Care  Code Status:  DNR  Prognosis:   < 2 weeks   Discharge Planning:  Home with Hospice  Care plan was discussed with patient, Melissa Cross, bedside RN, and attending physician  Thank you for allowing the Palliative Medicine Team to assist in the care of this patient.  Total time spent:  35 min.     Greater than 50%  of this time was spent counseling and coordinating care related to the above assessment and plan.  Florentina Jenny, PA-C Palliative Medicine  Please contact Palliative MedicineTeam phone at [phone] for questions and concerns between 7 am - 7 pm.   Please see AMION for individual provider pager numbers.

## 2016-11-06 NOTE — Progress Notes (Signed)
Signed DNR, Medical Necessity Form and Demographics placed on the front of the pt chart. Nursing to call [facility] for transport when discharge complete. Marney Doctor RN,BSN,NCM [phone]

## 2016-11-06 NOTE — Discharge Summary (Signed)
Physician Discharge Summary  Melissa Cross VFI:[id number] DOB: 12/28/1925 DOA: 11/01/2016  PCP: Patient, No Pcp Per  Admit date: 11/01/2016 Discharge date: 11/06/2016  Time spent: *40 minutes  Recommendations for Outpatient Follow-up:  1. Patient will be discharged home on home hospice   Discharge Diagnoses:  Principal Problem:   Weakness generalized Active Problems:   Renal cell carcinoma of right kidney (HCC)   Hyponatremia   Essential hypertension   Dehydration   Pancytopenia (HCC)   Hypothyroidism   Weakness   Drug-induced neutropenia (HCC)   Adult failure to thrive   Palliative care encounter   Comfort measures only status   Discharge Condition: Stable  Diet recommendation: Regular diet  Filed Weights   11/03/16 0900 11/05/16 0133 11/06/16 0210  Weight: 51.3 kg (113 lb) 71.7 kg (158 lb) 70.8 kg (156 lb)    History of present illness:  83 year old female with history of renal cell carcinoma, lymphoma, hypertension, hypothyroidism who presented to ED with progressive weakness, lethargy, poor by mouth intake over past 2 months prior to the admission. Patient is mostly wheelchair bound but was unable to get up at all due to weakness. Patient did have a fall and sustained a fracture on the right foot about 2 weeks prior to the admission.no acute infection identified on the admission. She was just recently on antibiotics for UTI, Cipro as well as Bactrim  Hospital Course:  1. Generalized weakness-unclear etiology.  Likely from history of renal cell carcinoma, lymphoma, chemotherapy-induced.  CT head showed no acute finding.   2. Febrile neutropenia-patient started on Granix, only received 1 dose on 11/04/2016.[name]  Blood cultures showed no growth.  Cefepime has been discontinued.  I started Granix 300 mcg subcutaneous today, and offered to stay in the hospital until absolute neutrophil count becomes greater than 1000 as per oncology recommendation.  Patient and family are  adamant to go home today with home hospice, after 1 dose of Granix 3. Renal cell carcinoma-Per oncology patient is not a good candidate for systemic therapy 4. Hyponatremia-resolved, likely from dehydration 5. Pancytopenia/anemia due to antineoplastic therapy/ thrombocytopenia-likely due to chemotherapy versus Bactrim.  WBC was 1.0 yesterday.    Hemoglobin 9.8 after 2 units PRBC on 11/02/2016.  Platelet count is 33. 6. Hypertension-continue nadolol 7. Goals of care discussion-patient family met with palliative care, patient is DNR/DNI.  Plan to take patient home with hospice.  Procedures:  None  Consultations:  None  Discharge Exam: Vitals:   11/05/16 2026 11/06/16 0443  BP: 127/76 140/74  Pulse: 70 69  Resp: 18 18  Temp: 99 F (37.2 C) 98.1 F (36.7 C)  SpO2: 99% 98%    General: Appears lethargic, but answers questions appropriately Cardiovascular: S1-S2, regular Respiratory: Clear to auscultation bilaterally  Discharge Instructions   Discharge Instructions    Diet - low sodium heart healthy    Complete by:  As directed    Increase activity slowly    Complete by:  As directed      Current Discharge Medication List    START taking these medications   Details  acetaminophen (TYLENOL) 325 MG tablet Take 2 tablets (650 mg total) by mouth every 6 (six) hours as needed for mild pain (or Fever >/= 101). Qty: 30 tablet, Refills: 2      CONTINUE these medications which have NOT CHANGED   Details  amLODipine (NORVASC) 5 MG tablet Take 1 tablet (5 mg total) by mouth daily. Qty: 30 tablet, Refills: 0    aspirin  EC 81 MG tablet Take 81 mg by mouth at bedtime.     Calcium-Magnesium-Vitamin D (CITRACAL CALCIUM+D) 600-40-500 MG-MG-UNIT TB24 Take 630 mg by mouth.   Associated Diagnoses: Lymphoma, large-cell, diffuse ([place]); Renal cell carcinoma of right kidney (HCC)    cholecalciferol (VITAMIN D) 400 UNITS TABS Take 400 Units by mouth daily.      CRANBERRY PO Take 1 tablet  by mouth daily.    gabapentin (NEURONTIN) 100 MG capsule Take 1 capsule in am, 1 in pm, and 2 at bedtime. Qty: 120 capsule, Refills: 2   Associated Diagnoses: Neuropathy    Multiple Vitamin (MULITIVITAMIN WITH MINERALS) TABS Take 1 tablet by mouth daily.      nadolol (CORGARD) 40 MG tablet Take 60 mg by mouth at bedtime.     pazopanib (VOTRIENT) 200 MG tablet Take 2 tablets (400 mg total) by mouth daily. Take on an empty stomach. Qty: 60 tablet, Refills: 11   Associated Diagnoses: Renal cell carcinoma, right (HCC)    prednisoLONE acetate (PRED FORTE) 1 % ophthalmic suspension instill 1 drop into right eye once daily    spironolactone (ALDACTONE) 25 MG tablet take 1/2 tablet by mouth once daily with food Refills: 0    timolol (BETIMOL) 0.5 % ophthalmic solution Place 1 drop into the left eye 2 (two) times daily.    vitamin B-12 (CYANOCOBALAMIN) 1000 MCG tablet Place 1,000 mcg under the tongue daily.     UNABLE TO FIND [place]  Use with ambulation Qty: 1 each, Refills: 1      STOP taking these medications     Calcium Citrate (CITRACAL PO)      ciprofloxacin (CIPRO) 250 MG tablet      levothyroxine (SYNTHROID, LEVOTHROID) 50 MCG tablet      sulfamethoxazole-trimethoprim (BACTRIM DS,SEPTRA DS) 800-160 MG tablet        Allergies  Allergen Reactions  . Crab [Shellfish Allergy] Nausea And Vomiting  . Dilaudid [Hydromorphone Hcl] Other (See Comments)    Hallucinations and nausea   Follow-up Information    [facility] Follow up.   Specialty:  Hospice and Palliative Medicine Contact information: [place] [address] [phone]            The results of significant diagnostics from this hospitalization (including imaging, microbiology, ancillary and laboratory) are listed below for reference.    Significant Diagnostic Studies: Dg Chest 2 View  Result Date: 10/17/2016 CLINICAL DATA:  Patient fell 3 days prior.  Injury to ankle.  EXAM: CHEST  2 VIEW COMPARISON:  7/11/8 FINDINGS: Port in the RIGHT chest with tip in distal SVC. Normal cardiac silhouette. Lungs are clear. Atherosclerotic calcification aorta. No evidence of thoracic sinus IMPRESSION: No acute cardiopulmonary process. Aortic Atherosclerosis (ICD10-I70.0). Electronically Signed   By: Suzy Bouchard M.D.   On: 10/17/2016 18:08   Dg Ankle Complete Right  Result Date: 10/17/2016 CLINICAL DATA:  [place] at home 3 days ago injuring RIGHT foot and ankle, unable to bear weight, bruising especially dorsal to the distal metatarsals and at lateral RIGHT ankle, swelling EXAM: RIGHT ANKLE - COMPLETE 3+ VIEW COMPARISON:  None FINDINGS: Minimal lateral soft tissue swelling. Bones demineralized. Joint spaces preserved. No acute fracture, dislocation, or bone destruction. IMPRESSION: No acute osseous abnormalities. Electronically Signed   By: Lavonia Dana M.D.   On: 10/17/2016 16:58   Ct Head Wo Contrast  Result Date: 11/01/2016 CLINICAL DATA:  83 year old with altered level of consciousness. EXAM: CT HEAD WITHOUT CONTRAST TECHNIQUE: Contiguous axial  images were obtained from the base of the skull through the vertex without intravenous contrast. COMPARISON:  None. FINDINGS: Brain: Cerebral atrophy with diffuse low-density in the periventricular white matter. No evidence for acute hemorrhage, mass lesion, midline shift, hydrocephalus or large infarct. Vascular: No hyperdense vessel or unexpected calcification. Skull: Normal. Negative for fracture or focal lesion. Sinuses/Orbits: Mild mucosal disease in the posterior right ethmoid air cells. Other: None. IMPRESSION: No acute intracranial abnormality. Atrophy and evidence for chronic small vessel ischemic changes. Electronically Signed   By: Markus Daft M.D.   On: 11/01/2016 14:44   Dg Foot Complete Right  Result Date: 10/17/2016 CLINICAL DATA:  [place] at home 3 days ago injuring RIGHT foot and ankle, unable to bear weight, bruising  especially dorsal to the distal metatarsals and at lateral RIGHT ankle, swelling EXAM: RIGHT FOOT COMPLETE - 3+ VIEW COMPARISON:  None FINDINGS: Diffuse osseous demineralization. Joint spaces preserved. Minimally distracted intra-articular fracture at head of proximal phalanx second toe. No additional fracture, dislocation, or bone destruction. Soft tissue swelling over the dorsal aspects of the distal metatarsals and MTP joints. IMPRESSION: Intra-articular fracture at head of proximal phalanx RIGHT second toe. No definite additional acute bony abnormalities. Electronically Signed   By: Lavonia Dana M.D.   On: 10/17/2016 17:00    Microbiology: Recent Results (from the past 240 hour(s))  Culture, blood (Routine X 2) w Reflex to ID Panel     Status: None (Preliminary result)   Collection Time: 11/01/16  5:05 PM  Result Value Ref Range Status   Specimen Description BLOOD LEFT ARM  Final   Special Requests IN PEDIATRIC BOTTLE Blood Culture adequate volume  Final   Culture   Final    NO GROWTH 3 DAYS Performed at [facility], [place] [address] [place], [place] [address]    Report Status PENDING  Incomplete  Culture, blood (Routine X 2) w Reflex to ID Panel     Status: None (Preliminary result)   Collection Time: 11/01/16  5:12 PM  Result Value Ref Range Status   Specimen Description BLOOD LEFT HAND  Final   Special Requests   Final    BOTTLES DRAWN AEROBIC ONLY Blood Culture adequate volume   Culture   Final    NO GROWTH 3 DAYS Performed at [facility], [place] [address] [place], [place] [address]    Report Status PENDING  Incomplete     Labs: Basic Metabolic Panel:  Recent Labs Lab 11/02/16 0544 11/03/16 0437 11/04/16 0756 11/05/16 0640 11/06/16 0500  NA 131* 134* 135 133* 135  K 3.8 3.5 3.4* 3.4* 3.8  CL 104 106 104 100* 102  CO2 '22 22 24 24 24  '$ GLUCOSE 96 96 103* 103* 92  BUN 26* 20 19 21* 22*  CREATININE 0.82 0.76 0.73 0.69 0.64  CALCIUM 8.5* 8.5* 8.7* 8.8* 8.8*    Liver Function Tests:  Recent Labs Lab 11/01/16 2253  AST 39  ALT 21  ALKPHOS 70  BILITOT 0.8  PROT 5.4*  ALBUMIN 2.7*   No results for input(s): LIPASE, AMYLASE in the last 168 hours. No results for input(s): AMMONIA in the last 168 hours. CBC:  Recent Labs Lab 11/01/16 2253 11/02/16 0544 11/02/16 2018 11/03/16 0437 11/04/16 0756 11/05/16 1813 11/06/16 0500  WBC 0.6* 0.5*  --  0.9* 1.0* 1.7* 1.3*  NEUTROABS 0.1* 0.1*  --   --  0.3* 0.9*  --   HGB 6.7* 6.2* 8.7* 7.9* 9.8* 10.3* 10.0*  HCT 19.7* 18.7* 24.8* 23.3* 29.2* 29.8* 30.7*  MCV 98.5 97.4  --  97.5 96.1 95.2 96.5  PLT 53* 55*  --  50* 37* 34* 33*       Signed:  Aimy Sweeting S MD.  [facility] 11/06/2016, 1:47 PM

## 2016-11-07 LAB — CULTURE, BLOOD (ROUTINE X 2)
CULTURE: NO GROWTH
CULTURE: NO GROWTH
SPECIAL REQUESTS: ADEQUATE
Special Requests: ADEQUATE

## 2016-11-12 ENCOUNTER — Ambulatory Visit: Payer: Medicare Other | Admitting: Internal Medicine

## 2016-11-12 ENCOUNTER — Other Ambulatory Visit: Payer: Medicare Other

## 2016-11-17 ENCOUNTER — Other Ambulatory Visit: Payer: Medicare Other

## 2016-11-17 ENCOUNTER — Ambulatory Visit: Payer: Medicare Other | Admitting: Oncology

## 2016-11-21 ENCOUNTER — Telehealth: Payer: Self-pay | Admitting: Medical Oncology

## 2016-11-21 NOTE — Telephone Encounter (Signed)
dtr needs FMLA papers signed by 11/20. I left message that it takes 7-10 business days to get forms completed and signed. She can bring form by office or fax to [phone].

## 2016-12-06 DEATH — deceased

## 2016-12-12 ENCOUNTER — Other Ambulatory Visit: Payer: Self-pay | Admitting: Nurse Practitioner
